# Patient Record
Sex: Male | Born: 1985
Health system: Southern US, Community
[De-identification: ages and names within clinical notes are randomized; demographics above are authoritative.]

## PROBLEM LIST (undated history)

## (undated) ENCOUNTER — Ambulatory Visit (HOSPITAL_COMMUNITY): Admission: EM | Discharge status: Absent without leave | Payer: Medicaid Other

## (undated) DIAGNOSIS — F209 Schizophrenia, unspecified: Secondary | ICD-10-CM

---

## 2008-07-18 ENCOUNTER — Emergency Department (HOSPITAL_COMMUNITY): Admission: EM | Admit: 2008-07-18 | Discharge: 2008-07-18 | Payer: Self-pay | Admitting: Emergency Medicine

## 2012-09-20 ENCOUNTER — Encounter (HOSPITAL_COMMUNITY): Payer: Self-pay | Admitting: Emergency Medicine

## 2012-09-20 ENCOUNTER — Emergency Department (HOSPITAL_COMMUNITY)
Admission: EM | Admit: 2012-09-20 | Discharge: 2012-09-20 | Disposition: A | Discharge status: Home / Self Care | Payer: Self-pay | Attending: Emergency Medicine | Admitting: Emergency Medicine

## 2012-09-20 DIAGNOSIS — K029 Dental caries, unspecified: Secondary | ICD-10-CM | POA: Insufficient documentation

## 2012-09-20 DIAGNOSIS — K002 Abnormalities of size and form of teeth: Secondary | ICD-10-CM | POA: Insufficient documentation

## 2012-09-20 MED ORDER — PENICILLIN V POTASSIUM 500 MG PO TABS: 500.0000 mg | ORAL_TABLET | Freq: Four times a day (QID) | ORAL | Status: DC

## 2012-09-20 MED ORDER — OXYCODONE-ACETAMINOPHEN 5-325 MG PO TABS
2.0000 | ORAL_TABLET | Freq: Once | ORAL | Status: AC
  Administered 2012-09-20: 2 via ORAL
  Filled 2012-09-20: qty 2

## 2012-09-20 MED ORDER — OXYCODONE-ACETAMINOPHEN 5-325 MG PO TABS: ORAL_TABLET | ORAL | Status: DC

## 2012-09-20 NOTE — ED Provider Notes (Signed)
CSN: [id number]     Arrival date & time 09/20/12  1013 History   First MD Initiated Contact with Patient 09/20/12 1017     Chief Complaint  Patient presents with  . Dental Pain   (Consider location/radiation/quality/duration/timing/severity/associated sxs/prior Treatment) HPI Pt is a 27yo male c/o dental pain x1 week, worsening x3 days. Pain is gradually worsening, constant, 10/10, throbbing in right upper front tooth, radiating to back top jaw.  Has tried Ibuprofen, doesn't help.  Does not have regular dental care.  Denies trauma to face or mouth. Denies fever, n/v, difficulty breathing or swallowing.  Denies allergies PCN.  History reviewed. No pertinent past medical history. History reviewed. No pertinent past surgical history. No family history on file. History  Substance Use Topics  . Smoking status: Never Smoker   . Smokeless tobacco: Not on file  . Alcohol Use: Yes     Comment: occasional    Review of Systems  Constitutional: Negative for fever and chills.  HENT: Positive for dental problem. Negative for sore throat, drooling, mouth sores, trouble swallowing and voice change.   Gastrointestinal: Negative for nausea and vomiting.  All other systems reviewed and are negative.    Allergies  Review of patient's allergies indicates no known allergies.  Home Medications   Current Outpatient Rx  Name  Route  Sig  Dispense  Refill  . ibuprofen (ADVIL,MOTRIN) 200 MG tablet   Oral   Take 800 mg by mouth every 6 (six) hours as needed for pain.         Marland Kitchen oxyCODONE-acetaminophen (PERCOCET/ROXICET) 5-325 MG per tablet      Take 1-2 pills every 4-6 hours as needed for pain.   10 tablet   0   . penicillin v potassium (VEETID) 500 MG tablet   Oral   Take 1 tablet (500 mg total) by mouth 4 (four) times daily.   40 tablet   0    BP 136/95  Pulse 68  Temp(Src) 98.3 F (36.8 C) (Oral)  Resp 17  Ht 6\' 1"  (1.854 m)  Wt 155 lb (70.308 kg)  BMI 20.45 kg/m2  SpO2  98% Physical Exam  Nursing note and vitals reviewed. Constitutional: He is oriented to person, place, and time. He appears well-developed and well-nourished.  HENT:  Head: Normocephalic and atraumatic.  Right Ear: Hearing, tympanic membrane, external ear and ear canal normal.  Left Ear: Hearing, tympanic membrane, external ear and ear canal normal.  Nose: Nose normal.  Mouth/Throat: Uvula is midline, oropharynx is clear and moist and mucous membranes are normal. Abnormal dentition. Dental caries present. No dental abscesses or edematous. No oropharyngeal exudate, posterior oropharyngeal edema, posterior oropharyngeal erythema or tonsillar abscesses.    Cracked tooth #8 (front right), TTP, no obvious abscess.  No drainage, swelling or bleeding.  Eyes: EOM are normal.  Neck: Normal range of motion.  Cardiovascular: Normal rate.   Pulmonary/Chest: Effort normal.  Musculoskeletal: Normal range of motion.  Neurological: He is alert and oriented to person, place, and time.  Skin: Skin is warm and dry.  Psychiatric: He has a normal mood and affect. His behavior is normal.    ED Course  Procedures (including critical care time) Labs Review Labs Reviewed - No data to display Imaging Review No results found.  MDM   1. Pain due to dental caries    Pt has a cracked front tooth with dental carrie, pain x1 week. No obvious abscess seen on exam.  Rx: PCN and percocet.  Provided dental resource guide as well as flyer for [employer] [facility] at [facility] Sept 26-27th.  No dentist on call today.  Return precautions provided.  Pt verbalized understanding and agreement with tx plan.    Junius Finner, PA-C 09/21/12 1417

## 2012-09-20 NOTE — ED Notes (Signed)
Pt states that dental pain has been there but really progressed over the past three days.  Pain in right upper front tooth and radiates to back of top jaw.

## 2012-09-20 NOTE — Progress Notes (Signed)
[facility] provided pt with a list of primary care resources and dental resources.  °

## 2012-09-21 NOTE — ED Provider Notes (Signed)
Medical screening examination/treatment/procedure(s) were performed by non-physician practitioner and as supervising physician I was immediately available for consultation/collaboration.   Shanna Cisco, MD 09/21/12 [phone]

## 2013-06-11 ENCOUNTER — Emergency Department (HOSPITAL_COMMUNITY)
Admission: EM | Admit: 2013-06-11 | Discharge: 2013-06-11 | Disposition: A | Discharge status: Home / Self Care | Payer: Self-pay | Attending: Emergency Medicine | Admitting: Emergency Medicine

## 2013-06-11 ENCOUNTER — Emergency Department (HOSPITAL_COMMUNITY): Payer: Self-pay

## 2013-06-11 DIAGNOSIS — Y9389 Activity, other specified: Secondary | ICD-10-CM | POA: Insufficient documentation

## 2013-06-11 DIAGNOSIS — S62304A Unspecified fracture of fourth metacarpal bone, right hand, initial encounter for closed fracture: Secondary | ICD-10-CM

## 2013-06-11 DIAGNOSIS — W3400XA Accidental discharge from unspecified firearms or gun, initial encounter: Secondary | ICD-10-CM | POA: Insufficient documentation

## 2013-06-11 DIAGNOSIS — Y9289 Other specified places as the place of occurrence of the external cause: Secondary | ICD-10-CM | POA: Insufficient documentation

## 2013-06-11 DIAGNOSIS — S61409A Unspecified open wound of unspecified hand, initial encounter: Secondary | ICD-10-CM | POA: Insufficient documentation

## 2013-06-11 DIAGNOSIS — Z23 Encounter for immunization: Secondary | ICD-10-CM | POA: Insufficient documentation

## 2013-06-11 DIAGNOSIS — IMO0002 Reserved for concepts with insufficient information to code with codable children: Secondary | ICD-10-CM | POA: Insufficient documentation

## 2013-06-11 DIAGNOSIS — S62309A Unspecified fracture of unspecified metacarpal bone, initial encounter for closed fracture: Secondary | ICD-10-CM | POA: Insufficient documentation

## 2013-06-11 MED ORDER — TETANUS-DIPHTH-ACELL PERTUSSIS 5-2.5-18.5 LF-MCG/0.5 IM SUSP
0.5000 mL | Freq: Once | INTRAMUSCULAR | Status: AC
  Administered 2013-06-11: 0.5 mL via INTRAMUSCULAR
  Filled 2013-06-11: qty 0.5

## 2013-06-11 MED ORDER — HYDROCODONE-ACETAMINOPHEN 5-325 MG PO TABS: 2.0000 | ORAL_TABLET | ORAL | Status: DC | PRN

## 2013-06-11 MED ORDER — CEPHALEXIN 500 MG PO CAPS: 500.0000 mg | ORAL_CAPSULE | Freq: Two times a day (BID) | ORAL | Status: DC

## 2013-06-11 MED ORDER — HYDROCODONE-ACETAMINOPHEN 5-325 MG PO TABS
2.0000 | ORAL_TABLET | Freq: Once | ORAL | Status: AC
  Administered 2013-06-11: 2 via ORAL
  Filled 2013-06-11: qty 2

## 2013-06-11 MED ORDER — IBUPROFEN 800 MG PO TABS
800.0000 mg | ORAL_TABLET | Freq: Three times a day (TID) | ORAL | Status: DC
Start: 2013-06-11 — End: 2013-06-19

## 2013-06-11 NOTE — Discharge Instructions (Signed)
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 Organization         Address  Phone  Notes  [facility], Mobile Crisis Care Unit  [phone]   [facility]  [address] [place], [place] [phone]   Doristine Locks [address] [place] [phone]    Self-Help/Support Groups Organization         Address  Phone             Notes  [facility] - variety of support groups  [phone] Call for more information  Narcotics Anonymous (NA), [facility] [address], [employer] [place]  2 meetings at this location   Statistician         Address  Phone  Notes  [facility] [address],    [place]  [phone]   [facility]  [address] [place] [address], [place], [place] [phone]   [facility] Treatment Facility [address] [place], [place] [phone] Admissions: 8am-3pm M-F  [facility] [address],    [place], [place] [phone]   [facility] [address] [place], [place], [place] [phone]   [facility] [address]  [place], [place] [phone]   [facility] - Intensive Outpatient [address], [place], [place] [phone]   [facility] ([facility] [address] [place]  [place], [place] [phone] or [phone]   [facility] ([facility]) [address] [place], [place] [phone] Accepts Medicaid  [facility] [address],  [place] [phone] Substance Abuse/Addiction Treatment   [facility] Organization         Address  Phone  Notes  [facility]  [phone]   Angie Fava, PhD [address] Kn[address] [place], [place]   [phone] or [phone]   [facility] Behavioral   [address] [place], [place] [phone]   [facility] [address] [place], [place] [phone] Insurance/Medicaid/sponsorship through [employer] and Families [address]  [place], [place] [phone] [place], [place] [phone]    Dr. Adele Schilder  [phone]   [facility] Dept. 1) [address] [place] 2) [place] 3)  [address], [place] [phone]  [phone]   [place] [phone] or [phone] (After Hours)
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
## 2013-06-11 NOTE — ED Notes (Signed)
Ortho paged. 

## 2013-06-11 NOTE — Progress Notes (Signed)
Orthopedic Tech Progress Note Patient Details:  Wesley Peterson [birth year] [id number]  Ortho Devices Type of Ortho Device: Ulna gutter splint Ortho Device/Splint Location: rue Ortho Device/Splint Interventions: Application   Mikiya Nebergall 06/11/2013, 10:49 PM
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
## 2013-06-19 ENCOUNTER — Emergency Department (HOSPITAL_COMMUNITY)
Admission: EM | Admit: 2013-06-19 | Discharge: 2013-06-19 | Disposition: A | Discharge status: Home / Self Care | Payer: Self-pay | Attending: Emergency Medicine | Admitting: Emergency Medicine

## 2013-06-19 ENCOUNTER — Encounter (HOSPITAL_COMMUNITY): Payer: Self-pay | Admitting: Emergency Medicine

## 2013-06-19 DIAGNOSIS — Z09 Encounter for follow-up examination after completed treatment for conditions other than malignant neoplasm: Secondary | ICD-10-CM | POA: Insufficient documentation

## 2013-06-19 DIAGNOSIS — Z791 Long term (current) use of non-steroidal anti-inflammatories (NSAID): Secondary | ICD-10-CM | POA: Insufficient documentation

## 2013-06-19 DIAGNOSIS — Z792 Long term (current) use of antibiotics: Secondary | ICD-10-CM | POA: Insufficient documentation

## 2013-06-19 MED ORDER — OXYCODONE-ACETAMINOPHEN 5-325 MG PO TABS: 2.0000 | ORAL_TABLET | ORAL | Status: DC | PRN

## 2013-06-19 MED ORDER — HYDROCODONE-ACETAMINOPHEN 5-325 MG PO TABS
2.0000 | ORAL_TABLET | Freq: Once | ORAL | Status: AC
  Administered 2013-06-19: 2 via ORAL
  Filled 2013-06-19: qty 2
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
## 2013-06-19 NOTE — ED Provider Notes (Signed)
Medical screening examination/treatment/procedure(s) were performed by non-physician practitioner and as supervising physician I was immediately available for consultation/collaboration.  Lyanne Co, MD 06/19/13 [phone]

## 2013-06-19 NOTE — Discharge Instructions (Signed)
Take Percocet as needed for pain. Follow up with Dr. Melvyn Novas as soon as possible for further evaluation of your fracture.
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
## 2013-06-24 NOTE — Discharge Planning (Signed)
[facility] was not able to see patient, [facility] orange card information and resource guide will be mailed to the address listed. °

## 2013-09-09 ENCOUNTER — Encounter (HOSPITAL_COMMUNITY): Payer: Self-pay | Admitting: Emergency Medicine

## 2013-09-09 ENCOUNTER — Emergency Department (HOSPITAL_COMMUNITY)
Admission: EM | Admit: 2013-09-09 | Discharge: 2013-09-09 | Disposition: A | Discharge status: Home / Self Care | Payer: Self-pay | Attending: Emergency Medicine | Admitting: Emergency Medicine

## 2013-09-09 DIAGNOSIS — Z79899 Other long term (current) drug therapy: Secondary | ICD-10-CM | POA: Insufficient documentation

## 2013-09-09 DIAGNOSIS — R112 Nausea with vomiting, unspecified: Secondary | ICD-10-CM | POA: Insufficient documentation

## 2013-09-09 DIAGNOSIS — R197 Diarrhea, unspecified: Secondary | ICD-10-CM | POA: Insufficient documentation

## 2013-09-09 DIAGNOSIS — R1084 Generalized abdominal pain: Secondary | ICD-10-CM | POA: Insufficient documentation

## 2013-09-09 LAB — URINALYSIS, ROUTINE W REFLEX MICROSCOPIC
BILIRUBIN URINE: NEGATIVE
Glucose, UA: NEGATIVE mg/dL
HGB URINE DIPSTICK: NEGATIVE
Ketones, ur: NEGATIVE mg/dL
Leukocytes, UA: NEGATIVE
NITRITE: NEGATIVE
PH: 6.5 (ref 5.0–8.0)
Protein, ur: NEGATIVE mg/dL
SPECIFIC GRAVITY, URINE: 1.018 (ref 1.005–1.030)
Urobilinogen, UA: 0.2 mg/dL (ref 0.0–1.0)

## 2013-09-09 LAB — CBC
HCT: 40.4 % (ref 39.0–52.0)
Hemoglobin: 13.8 g/dL (ref 13.0–17.0)
MCH: 32.2 pg (ref 26.0–34.0)
MCHC: 34.2 g/dL (ref 30.0–36.0)
MCV: 94.4 fL (ref 78.0–100.0)
PLATELETS: 260 10*3/uL (ref 150–400)
RBC: 4.28 MIL/uL (ref 4.22–5.81)
RDW: 13.3 % (ref 11.5–15.5)
WBC: 6.1 10*3/uL (ref 4.0–10.5)

## 2013-09-09 LAB — COMPREHENSIVE METABOLIC PANEL
ALBUMIN: 4 g/dL (ref 3.5–5.2)
ALK PHOS: 45 U/L (ref 39–117)
ALT: 11 U/L (ref 0–53)
AST: 18 U/L (ref 0–37)
Anion gap: 12 (ref 5–15)
BILIRUBIN TOTAL: 0.3 mg/dL (ref 0.3–1.2)
BUN: 8 mg/dL (ref 6–23)
CHLORIDE: 102 meq/L (ref 96–112)
CO2: 26 mEq/L (ref 19–32)
Calcium: 9.6 mg/dL (ref 8.4–10.5)
Creatinine, Ser: 0.91 mg/dL (ref 0.50–1.35)
GFR calc Af Amer: >90 mL/min (ref >90)
GFR calc non Af Amer: >90 mL/min (ref >90)
Glucose, Bld: 131 mg/dL — ABNORMAL HIGH (ref 70–99)
POTASSIUM: 4.6 meq/L (ref 3.7–5.3)
SODIUM: 140 meq/L (ref 137–147)
Total Protein: 6.3 g/dL (ref 6.0–8.3)

## 2013-09-09 LAB — LIPASE, BLOOD: LIPASE: 29 U/L (ref 11–59)

## 2013-09-09 MED ORDER — ONDANSETRON HCL 4 MG PO TABS: 4.0000 mg | ORAL_TABLET | Freq: Four times a day (QID) | ORAL | Status: DC

## 2013-09-09 MED ORDER — ONDANSETRON HCL 4 MG/2ML IJ SOLN
4.0000 mg | Freq: Once | INTRAMUSCULAR | Status: AC
  Administered 2013-09-09: 4 mg via INTRAVENOUS
  Filled 2013-09-09: qty 2

## 2013-09-09 MED ORDER — SODIUM CHLORIDE 0.9 % IV BOLUS (SEPSIS)
1000.0000 mL | Freq: Once | INTRAVENOUS | Status: AC
  Administered 2013-09-09: 1000 mL via INTRAVENOUS

## 2013-09-09 NOTE — ED Notes (Signed)
Patient given gingerale for fluid challenge per his request  

## 2013-09-09 NOTE — ED Provider Notes (Signed)
CSN: [id number]     Arrival date & time 09/09/13  0704 History   First MD Initiated Contact with Patient 09/09/13 [phone]     Chief Complaint  Patient presents with  . Nausea  . Emesis  . Abdominal Pain     (Consider location/radiation/quality/duration/timing/severity/associated sxs/prior Treatment) HPI Pt presenting with c/o nausea and vomiting x 4 since yesterday evening when symptoms began.  He c/o diffuse abdominal pain and cramping.  Has had diarrhea x 3 since onset of illness as well- no blood or mucous.  Vomit is without blood or bile.  No fever/chils.  No urinary symptoms.  No specific sick contacts.  Symptoms began after eating chicken tenders last night- no others have been sick.  No recent travel.  There are no other associated systemic symptoms, there are no other alleviating or modifying factors.   History reviewed. No pertinent past medical history. History reviewed. No pertinent past surgical history. History reviewed. No pertinent family history. History  Substance Use Topics  . Smoking status: Never Smoker   . Smokeless tobacco: Not on file  . Alcohol Use: Yes     Comment: occasional    Review of Systems ROS reviewed and all otherwise negative except for mentioned in HPI    Allergies  Review of patient's allergies indicates no known allergies.  Home Medications   Prior to Admission medications   Medication Sig Start Date End Date Taking? Authorizing Provider  ondansetron (ZOFRAN) 4 MG tablet Take 1 tablet (4 mg total) by mouth every 6 (six) hours. 09/09/13   Ethelda Chick, MD   BP 146/89  Pulse 58  Temp(Src) 98.9 F (37.2 C) (Oral)  Resp 16  Ht  (1.854 m)  Wt 139 lb (63.05 kg)  BMI 18.34 kg/m2  SpO2 98% Vitals reviewed Physical Exam Physical Examination: General appearance - alert, well appearing, and in no distress Mental status - alert, oriented to person, place, and time Eyes - no conjunctival injection, no scleral icterus Mouth - mucous  membranes moist, pharynx normal without lesions Chest - clear to auscultation, no wheezes, rales or rhonchi, symmetric air entry Heart - normal rate, regular rhythm, normal S1, S2, no murmurs, rubs, clicks or gallops Abdomen - soft, mild diffuse tenderness to palpation, no gaurding or rebound tenderness, nabs, nondistended, no masses or organomegaly Extremities - peripheral pulses normal, no pedal edema, no clubbing or cyanosis Skin - normal coloration and turgor, no rashes  ED Course  Procedures (including critical care time) Labs Review Labs Reviewed  COMPREHENSIVE METABOLIC PANEL - Abnormal; Notable for the following:    Glucose, Bld 131 (*)    All other components within normal limits  CBC  LIPASE, BLOOD  URINALYSIS, ROUTINE W REFLEX MICROSCOPIC    Imaging Review No results found.   EKG Interpretation None      MDM   Final diagnoses:  Nausea vomiting and diarrhea    Pt presenting with c/o N/V/D which began yesterday evening.  Abdomen is benign- mild diffuse tenderness.  Labs reassuring.  Pt received IV fluids, nausea meds.  No sigifncant signs of dehydration.  Discharged with strict return precautions.  Pt agreeable with plan.    Ethelda Chick, MD 09/09/13 1356

## 2013-09-09 NOTE — ED Notes (Signed)
Patient complaining of nausea and vomiting X4 since yesterday at 19:00 after eating.  Patient complains of 10/10 abdominal pain.  Patient is having diarrhea x3 since yesterday.

## 2013-09-09 NOTE — ED Notes (Signed)
Pt attempting to provide urine specimen

## 2013-09-09 NOTE — ED Notes (Signed)
Patient had only drank a sip of ginger ale. Patient instructed to take his hat off of his face and wake up. Instructed patient again that he needed to drink in order to determine if he was able to keep fluids down. Patient finally drank 1/2 cup of ginger ale.

## 2013-09-09 NOTE — Discharge Instructions (Signed)
Return to the ED with any concerns including vomiting and not able to keep down liquids, worsening abdominal pain, pain which localizes in the right lower abdomen, decreased level of alertness/lethargy, or any other alarming symptoms

## 2014-02-16 ENCOUNTER — Emergency Department (HOSPITAL_COMMUNITY)
Admission: EM | Admit: 2014-02-16 | Discharge: 2014-02-17 | Disposition: A | Discharge status: Home / Self Care | Payer: Self-pay | Attending: Emergency Medicine | Admitting: Emergency Medicine

## 2014-02-16 ENCOUNTER — Encounter (HOSPITAL_COMMUNITY): Payer: Self-pay | Admitting: Emergency Medicine

## 2014-02-16 DIAGNOSIS — S060X9A Concussion with loss of consciousness of unspecified duration, initial encounter: Secondary | ICD-10-CM | POA: Insufficient documentation

## 2014-02-16 DIAGNOSIS — W1839XA Other fall on same level, initial encounter: Secondary | ICD-10-CM | POA: Insufficient documentation

## 2014-02-16 DIAGNOSIS — R55 Syncope and collapse: Secondary | ICD-10-CM | POA: Insufficient documentation

## 2014-02-16 DIAGNOSIS — S0990XA Unspecified injury of head, initial encounter: Secondary | ICD-10-CM

## 2014-02-16 DIAGNOSIS — Y998 Other external cause status: Secondary | ICD-10-CM | POA: Insufficient documentation

## 2014-02-16 DIAGNOSIS — F1210 Cannabis abuse, uncomplicated: Secondary | ICD-10-CM | POA: Insufficient documentation

## 2014-02-16 DIAGNOSIS — Y9389 Activity, other specified: Secondary | ICD-10-CM | POA: Insufficient documentation

## 2014-02-16 DIAGNOSIS — Y9289 Other specified places as the place of occurrence of the external cause: Secondary | ICD-10-CM | POA: Insufficient documentation

## 2014-02-16 LAB — I-STAT CHEM 8, ED
BUN: 14 mg/dL (ref 6–23)
Calcium, Ion: 1.16 mmol/L (ref 1.12–1.23)
Chloride: 105 mmol/L (ref 96–112)
Creatinine, Ser: 1.1 mg/dL (ref 0.50–1.35)
Glucose, Bld: 94 mg/dL (ref 70–99)
HCT: 46 % (ref 39.0–52.0)
Hemoglobin: 15.6 g/dL (ref 13.0–17.0)
Potassium: 3.7 mmol/L (ref 3.5–5.1)
Sodium: 142 mmol/L (ref 135–145)
TCO2: 22 mmol/L (ref 0–100)

## 2014-02-16 LAB — COMPREHENSIVE METABOLIC PANEL
ALT: 10 U/L (ref 0–53)
AST: 18 U/L (ref 0–37)
Albumin: 5.7 g/dL — ABNORMAL HIGH (ref 3.5–5.2)
Alkaline Phosphatase: 43 U/L (ref 39–117)
Anion gap: 7 (ref 5–15)
BUN: 15 mg/dL (ref 6–23)
CO2: 26 mmol/L (ref 19–32)
Calcium: 10 mg/dL (ref 8.4–10.5)
Chloride: 106 mmol/L (ref 96–112)
Creatinine, Ser: 1.01 mg/dL (ref 0.50–1.35)
GFR calc Af Amer: >90 mL/min (ref >90)
GFR calc non Af Amer: >90 mL/min (ref >90)
Glucose, Bld: 97 mg/dL (ref 70–99)
Potassium: 3.7 mmol/L (ref 3.5–5.1)
Sodium: 139 mmol/L (ref 135–145)
Total Bilirubin: 0.7 mg/dL (ref 0.3–1.2)
Total Protein: 8 g/dL (ref 6.0–8.3)

## 2014-02-16 LAB — RAPID URINE DRUG SCREEN, HOSP PERFORMED
Amphetamines: NOT DETECTED
Barbiturates: NOT DETECTED
Benzodiazepines: NOT DETECTED
Cocaine: NOT DETECTED
Opiates: NOT DETECTED
Tetrahydrocannabinol: POSITIVE — AB

## 2014-02-16 LAB — ETHANOL: Alcohol, Ethyl (B): <5 mg/dL (ref 0–9)

## 2014-02-16 LAB — CBC
HCT: 42.2 % (ref 39.0–52.0)
Hemoglobin: 14.2 g/dL (ref 13.0–17.0)
MCH: 32.8 pg (ref 26.0–34.0)
MCHC: 33.6 g/dL (ref 30.0–36.0)
MCV: 97.5 fL (ref 78.0–100.0)
Platelets: 218 10*3/uL (ref 150–400)
RBC: 4.33 MIL/uL (ref 4.22–5.81)
RDW: 13.1 % (ref 11.5–15.5)
WBC: 6.4 10*3/uL (ref 4.0–10.5)

## 2014-02-16 LAB — SALICYLATE LEVEL: Salicylate Lvl: <4 mg/dL (ref 2.8–20.0)

## 2014-02-16 LAB — ACETAMINOPHEN LEVEL: Acetaminophen (Tylenol), Serum: <10 ug/mL — ABNORMAL LOW (ref 10–30)
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
## 2014-02-16 NOTE — ED Notes (Addendum)
MDs Romeo AppleHarrison [facility] informed of situation, shown ED EKG and obtained order for catheterization.

## 2014-02-16 NOTE — ED Notes (Signed)
Bed: ZO10WA05 Expected date:  Expected time:  Means of arrival:  Comments: EMS in custody

## 2014-02-17 ENCOUNTER — Emergency Department (HOSPITAL_COMMUNITY): Payer: Self-pay

## 2014-02-17 LAB — CBG MONITORING, ED: Glucose-Capillary: 93 mg/dL (ref 70–99)

## 2014-02-17 NOTE — Discharge Instructions (Signed)
Head Injury °You have received a head injury. It does not appear serious at this time. Headaches and vomiting are common following head injury. It should be easy to awaken from sleeping. Sometimes it is necessary for you to stay in the emergency department for a while for observation. Sometimes admission to the hospital may be needed. After injuries such as yours, most problems occur within the first 24 hours, but side effects may occur up to 7-10 days after the injury. It is important for you to carefully monitor your condition and contact your health care provider or seek immediate medical care if there is a change in your condition. °WHAT ARE THE TYPES OF HEAD INJURIES? °Head injuries can be as minor as a bump. Some head injuries can be more severe. More severe head injuries include: °· A jarring injury to the brain (concussion). °· A bruise of the brain (contusion). This mean there is bleeding in the brain that can cause swelling. °· A cracked skull (skull fracture). °· Bleeding in the brain that collects, clots, and forms a bump (hematoma). °WHAT CAUSES A HEAD INJURY? °A serious head injury is most likely to happen to someone who is in a car wreck and is not wearing a seat belt. Other causes of major head injuries include bicycle or motorcycle accidents, sports injuries, and falls. °HOW ARE HEAD INJURIES DIAGNOSED? °A complete history of the event leading to the injury and your current symptoms will be helpful in diagnosing head injuries. Many times, pictures of the brain, such as CT or MRI are needed to see the extent of the injury. Often, an overnight hospital stay is necessary for observation.  °WHEN SHOULD I SEEK IMMEDIATE MEDICAL CARE?  °You should get help right away if: °· You have confusion or drowsiness. °· You feel sick to your stomach (nauseous) or have continued, forceful vomiting. °· You have dizziness or unsteadiness that is getting worse. °· You have severe, continued headaches not relieved by  medicine. Only take over-the-counter or prescription medicines for pain, fever, or discomfort as directed by your health care provider. °· You do not have normal function of the arms or legs or are unable to walk. °· You notice changes in the black spots in the center of the colored part of your eye (pupil). °· You have a clear or bloody fluid coming from your nose or ears. °· You have a loss of vision. °During the next 24 hours after the injury, you must stay with someone who can watch you for the warning signs. This person should contact local emergency services (911 in the [place] if you have seizures, you become unconscious, or you are unable to wake up. °HOW CAN I PREVENT A HEAD INJURY IN THE FUTURE? °The most important factor for preventing major head injuries is avoiding motor vehicle accidents.  To minimize the potential for damage to your head, it is crucial to wear seat belts while riding in motor vehicles. Wearing helmets while bike riding and playing collision sports (like football) is also helpful. Also, avoiding dangerous activities around the house will further help reduce your risk of head injury.  °WHEN CAN I RETURN TO NORMAL ACTIVITIES AND ATHLETICS? °You should be reevaluated by your health care provider before returning to these activities. If you have any of the following symptoms, you should not return to activities or contact sports until 1 week after the symptoms have stopped: °· Persistent headache. °· Dizziness or vertigo. °· Poor attention and concentration. °· Confusion. °·   Memory problems. °· Nausea or vomiting. °· Fatigue or tire easily. °· Irritability. °· Intolerant of bright lights or loud noises. °· Anxiety or depression. °· Disturbed sleep. °MAKE SURE YOU:  °· Understand these instructions. °· Will watch your condition. °· Will get help right away if you are not doing well or get worse. °Document Released: 12/26/2004 Document Revised: 12/31/2012 Document Reviewed:  09/02/2012 °ExitCare® Patient Information ©2015 [employer]. This information is not intended to replace advice given to you by your health care provider. Make sure you discuss any questions you have with your health care provider. ° °Syncope °Syncope means a person passes out (faints). The person usually wakes up in less than 5 minutes. It is important to seek medical care for syncope. °HOME CARE °· Have someone stay with you until you feel normal. °· Do not drive, use machines, or play sports until your doctor says it is okay. °· Keep all doctor visits as told. °· Lie down when you feel like you might pass out. Take deep breaths. Wait until you feel normal before standing up. °· Drink enough fluids to keep your pee (urine) clear or pale yellow. °· If you take blood pressure or heart medicine, get up slowly. Take several minutes to sit and then stand. °GET HELP RIGHT AWAY IF:  °· You have a severe headache. °· You have pain in the chest, belly (abdomen), or back. °· You are bleeding from the mouth or butt (rectum). °· You have black or tarry poop (stool). °· You have an irregular or very fast heartbeat. °· You have pain with breathing. °· You keep passing out, or you have shaking (seizures) when you pass out. °· You pass out when sitting or lying down. °· You feel confused. °· You have trouble walking. °· You have severe weakness. °· You have vision problems. °If you fainted, call your local emergency services (911 in [place] Do not drive yourself to the hospital. °MAKE SURE YOU:  °· Understand these instructions. °· Will watch your condition. °· Will get help right away if you are not doing well or get worse. °Document Released: 06/14/2007 Document Revised: 06/27/2011 Document Reviewed: 02/24/2011 °ExitCare® Patient Information ©2015 [employer]. This information is not intended to replace advice given to you by your health care provider. Make sure you discuss any questions you have with your health care  provider. ° °

## 2014-02-17 NOTE — ED Notes (Signed)
Pt ambulating independently w/ steady gait on d/c in no acute distress, A&Ox4.D/c instructions reviewed w/ pt and family - pt and family deny any further questions or concerns at present.  

## 2014-02-27 NOTE — ED Provider Notes (Signed)
CSN: [id number]     Arrival date & time 02/16/14  2015 History   First MD Initiated Contact with Patient 02/16/14 2245     Chief Complaint  Patient presents with  . Loss of Consciousness  . Medical Clearance     (Consider location/radiation/quality/duration/timing/severity/associated sxs/prior Treatment) HPI   28yM with syncope? Apparently became unresponsive while in back of police vehicle after being arrested. Pt guarded historian. Not very forthcoming with history despite multiple lines of questioning. Does answer questions appropriately though. Reports striking back of when when being taken into custody. Mild pain since. Did not lose consciousness with this. Was not until ~10 minutes later when in back of car that he became unresponsive. He denies preceding symptoms. Denies ingestion. NO significant PMHx. No history of recurrent syncope. No complaints currently aside from mild HA.   History reviewed. No pertinent past medical history. History reviewed. No pertinent past surgical history. History reviewed. No pertinent family history. History  Substance Use Topics  . Smoking status: Never Smoker   . Smokeless tobacco: Not on file  . Alcohol Use: Yes     Comment: occasional    Review of Systems  All systems reviewed and negative, other than as noted in HPI.   Allergies  Review of patient's allergies indicates no known allergies.  Home Medications   Prior to Admission medications   Medication Sig Start Date End Date Taking? Authorizing Provider  ondansetron (ZOFRAN) 4 MG tablet Take 1 tablet (4 mg total) by mouth every 6 (six) hours. 09/09/13   Ethelda Chick, MD   BP 140/89 mmHg  Pulse 37  Resp 12  SpO2 85% Physical Exam  Constitutional: He is oriented to person, place, and time. He appears well-developed and well-nourished. No distress.  HENT:  Head: Normocephalic and atraumatic.  Mouth/Throat: No oropharyngeal exudate.  Eyes: Conjunctivae and EOM are normal. Pupils  are equal, round, and reactive to light. Right eye exhibits no discharge. Left eye exhibits no discharge.  Neck: Neck supple.  No midline spinal tenderness  Cardiovascular: Normal rate, regular rhythm and normal heart sounds.  Exam reveals no gallop and no friction rub.   No murmur heard. Pulmonary/Chest: Effort normal and breath sounds normal. No respiratory distress.  Abdominal: Soft. He exhibits no distension. There is no tenderness.  Musculoskeletal: He exhibits no edema or tenderness.  Neurological: He is alert and oriented to person, place, and time. No cranial nerve deficit. He exhibits normal muscle tone. Coordination normal.  Skin: Skin is warm and dry.  Psychiatric: He has a normal mood and affect. His behavior is normal. Thought content normal.  Nursing note and vitals reviewed.   ED Course  Procedures (including critical care time) Labs Review Labs Reviewed  ACETAMINOPHEN LEVEL - Abnormal; Notable for the following:    Acetaminophen (Tylenol), Serum <10.0 (*)    All other components within normal limits  COMPREHENSIVE METABOLIC PANEL - Abnormal; Notable for the following:    Albumin 5.7 (*)    All other components within normal limits  URINE RAPID DRUG SCREEN (HOSP PERFORMED) - Abnormal; Notable for the following:    Tetrahydrocannabinol POSITIVE (*)    All other components within normal limits  CBC  ETHANOL  SALICYLATE LEVEL  CBG MONITORING, ED  I-STAT CHEM 8, ED    Imaging Review No results found.   EKG Interpretation   Date/Time:  Monday February 16 2014 20:21:13 EST Ventricular Rate:  75 PR Interval:  161 QRS Duration: 101 QT Interval:  368  QTC Calculation: 411 R Axis:   87 Text Interpretation:  Sinus rhythm RAE diffuse STE, likely BER no old  Confirmed by Rasheema Truluck  MD, Jaskarn Schweer [phone]([id number]) on 02/16/2014 11:03:47 PM      MDM   Final diagnoses:  Head injury  Syncope and collapse    28yM with apparent syncope after being taken into police custody. Pt very  guarded historian. His exam is fairly unremarkable though as well as his work-up. HD stable. Low suspicion for emergent process.     Raeford RazorStephen Adonai Selsor, MD 02/27/14 [phone]

## 2014-10-08 ENCOUNTER — Emergency Department (HOSPITAL_COMMUNITY)
Admission: EM | Admit: 2014-10-08 | Discharge: 2014-10-08 | Disposition: A | Discharge status: Home / Self Care | Payer: Self-pay | Attending: Emergency Medicine | Admitting: Emergency Medicine

## 2014-10-08 ENCOUNTER — Encounter (HOSPITAL_COMMUNITY): Payer: Self-pay | Admitting: *Deleted

## 2014-10-08 DIAGNOSIS — L0211 Cutaneous abscess of neck: Secondary | ICD-10-CM | POA: Insufficient documentation

## 2014-10-08 DIAGNOSIS — L0291 Cutaneous abscess, unspecified: Secondary | ICD-10-CM

## 2014-10-08 DIAGNOSIS — Z79899 Other long term (current) drug therapy: Secondary | ICD-10-CM | POA: Insufficient documentation

## 2014-10-08 MED ORDER — LIDOCAINE-EPINEPHRINE (PF) 2 %-1:200000 IJ SOLN
20.0000 mL | Freq: Once | INTRAMUSCULAR | Status: AC
  Administered 2014-10-08: 20 mL via INTRADERMAL
  Filled 2014-10-08: qty 20

## 2014-10-08 NOTE — ED Provider Notes (Signed)
CSN: [id number]     Arrival date & time 10/08/14  2206 History  By signing my name below, I, Soijett Blue, attest that this documentation has been prepared under the direction and in the presence of Wynetta Emery, PA-C Electronically Signed: Soijett Blue, ED Scribe. 10/08/2014. 10:35 PM.   Chief Complaint  Patient presents with  . Abscess      The history is provided by the patient. No language interpreter was used.    Wesley Peterson is a 29 y.o. male who presents to the Emergency Department complaining of abscess to the back of the neck onset 1 year worsening for 2 days. He states that he is having associated symptoms of redness and swelling. He states that he has not tried any medications for the relief of his symptoms. He denies fever, chills, drainage, and any other symptoms. Denies allergies to medications.   History reviewed. No pertinent past medical history. History reviewed. No pertinent past surgical history. History reviewed. No pertinent family history. Social History  Substance Use Topics  . Smoking status: Never Smoker   . Smokeless tobacco: None  . Alcohol Use: Yes     Comment: occasional    Review of Systems  A complete 10 system review of systems was obtained and all systems are negative except as noted in the HPI and PMH.    Allergies  Review of patient's allergies indicates no known allergies.  Home Medications   Prior to Admission medications   Medication Sig Start Date End Date Taking? Authorizing Provider  ondansetron (ZOFRAN) 4 MG tablet Take 1 tablet (4 mg total) by mouth every 6 (six) hours. 09/09/13   Jerelyn Scott, MD   BP 148/79 mmHg  Pulse 109  Temp(Src) 98.5 F (36.9 C) (Oral)  Resp 20  SpO2 100% Physical Exam  Constitutional: He is oriented to person, place, and time. He appears well-developed and well-nourished. No distress.  HENT:  Head: Normocephalic and atraumatic.  Eyes: EOM are normal.  Neck: Neck supple.  Cardiovascular: Normal  rate.   Pulmonary/Chest: Effort normal. No respiratory distress.  Abdominal: Soft. There is no tenderness.  Musculoskeletal: Normal range of motion.  Neurological: He is alert and oriented to person, place, and time.  Skin: Skin is warm and dry.  3 cm fluctuant abscess to posterior right neck. No surrounding cellulitis with full ROM of neck with no midline tenderness.   Psychiatric: He has a normal mood and affect. His behavior is normal.  Nursing note and vitals reviewed.   ED Course  Procedures (including critical care time) DIAGNOSTIC STUDIES: Oxygen Saturation is 100% on RA, nl by my interpretation.    COORDINATION OF CARE: 10:37 PM Discussed treatment plan with pt at bedside which includes I&D and pt agreed to plan.   INCISION AND DRAINAGE PROCEDURE NOTE: Patient identification was confirmed and verbal consent was obtained. This procedure was performed by Wynetta Emery, PA-C at 10:45 PM. Site: posterior right neck Sterile procedures observed: Yes Needle size: 27 gauge Anesthetic used (type and amt): 2 % lidocaine with epi Blade size: 11 Drainage: moderate Complexity: Complex Packing used: No Site anesthetized, incision made over site, wound drained and explored loculations, rinsed with copious amounts of normal saline, wound packed with sterile gauze, covered with dry, sterile dressing.  Pt tolerated procedure well without complications.  Instructions for care discussed verbally and pt provided with additional written instructions for homecare and f/u.    Labs Review Labs Reviewed - No data to display  Imaging Review  No results found. I have personally reviewed and evaluated these images and lab results as part of my medical decision-making.   EKG Interpretation None      MDM   Final diagnoses:  Abscess    Filed Vitals:   10/08/14 2211  BP: 148/79  Pulse: 109  Temp: 98.5 F (36.9 C)  TempSrc: Oral  Resp: 20  SpO2: 100%    Medications   lidocaine-EPINEPHrine (XYLOCAINE W/EPI) 2 %-1:200000 (PF) injection 20 mL (20 mLs Intradermal Given by Other 10/08/14 2245)    Wesley Peterson is a pleasant 29 y.o. male presenting with abscess to right lateral posterior neck. I and D performed with a large amount of purulent drainage. Large incision made, no indication for packing. No surrounding cellulitis.  Evaluation does not show pathology that would require ongoing emergent intervention or inpatient treatment. Pt is hemodynamically stable and mentating appropriately. Discussed findings and plan with patient/guardian, who agrees with care plan. All questions answered. Return precautions discussed and outpatient follow up given.    I personally performed the services described in this documentation, which was scribed in my presence. The recorded information has been reviewed and is accurate.    Wynetta Emery, PA-C 10/08/14 2308  Richardean Canal, MD 10/09/14 [phone]

## 2014-10-08 NOTE — ED Notes (Signed)
Pt in c/o lump to the back of his neck, states it has been there for the past year but in the last few days area has become red and swollen, denies drainage

## 2014-10-08 NOTE — Discharge Instructions (Signed)
Please follow with your primary care doctor in the next 2 days for a check-up. They must obtain records for further management.  ° °Do not hesitate to return to the Emergency Department for any new, worsening or concerning symptoms.  ° ° °Abscess °Care After °An abscess (also called a boil or furuncle) is an infected area that contains a collection of pus. Signs and symptoms of an abscess include pain, tenderness, redness, or hardness, or you may feel a moveable soft area under your skin. An abscess can occur anywhere in the body. The infection may spread to surrounding tissues causing cellulitis. A cut (incision) by the surgeon was made over your abscess and the pus was drained out. Gauze may have been packed into the space to provide a drain that will allow the cavity to heal from the inside outwards. The boil may be painful for 5 to 7 days. Most people with a boil do not have high fevers. Your abscess, if seen early, may not have localized, and may not have been lanced. If not, another appointment may be required for this if it does not get better on its own or with medications. °HOME CARE INSTRUCTIONS  °· Only take over-the-counter or prescription medicines for pain, discomfort, or fever as directed by your caregiver. °· When you bathe, soak and then remove gauze or iodoform packs at least daily or as directed by your caregiver. You may then wash the wound gently with mild soapy water. Repack with gauze or do as your caregiver directs. °SEEK IMMEDIATE MEDICAL CARE IF:  °· You develop increased pain, swelling, redness, drainage, or bleeding in the wound site. °· You develop signs of generalized infection including muscle aches, chills, fever, or a general ill feeling. °· An oral temperature above 102° F (38.9° C) develops, not controlled by medication. °See your caregiver for a recheck if you develop any of the symptoms described above. If medications (antibiotics) were prescribed, take them as  directed. °Document Released: 07/14/2004 Document Revised: 03/20/2011 Document Reviewed: 03/11/2007 °[employer]® Patient Information ©2015 [employer]. This information is not intended to replace advice given to you by your health care provider. Make sure you discuss any questions you have with your health care provider. ° °

## 2014-10-08 NOTE — ED Notes (Signed)
PA at bedside preforming I&D procedure

## 2014-10-31 ENCOUNTER — Emergency Department (HOSPITAL_COMMUNITY)
Admission: EM | Admit: 2014-10-31 | Discharge: 2014-10-31 | Disposition: A | Discharge status: Home / Self Care | Payer: Self-pay | Attending: Emergency Medicine | Admitting: Emergency Medicine

## 2014-10-31 ENCOUNTER — Encounter (HOSPITAL_COMMUNITY): Payer: Self-pay | Admitting: Emergency Medicine

## 2014-10-31 DIAGNOSIS — Z5189 Encounter for other specified aftercare: Secondary | ICD-10-CM

## 2014-10-31 DIAGNOSIS — Z720 Tobacco use: Secondary | ICD-10-CM | POA: Insufficient documentation

## 2014-10-31 DIAGNOSIS — Z4801 Encounter for change or removal of surgical wound dressing: Secondary | ICD-10-CM | POA: Insufficient documentation

## 2014-10-31 NOTE — ED Notes (Signed)
Pt was here 9/29 for I & D of right neck abscess. Returns today because he feels it is not getting better.

## 2014-10-31 NOTE — ED Provider Notes (Signed)
CSN: [id number]     Arrival date & time 10/31/14  [id number] History  By signing my name below, I, Sonum Patel, attest that this documentation has been prepared under the direction and in the presence of Teressa LowerVrinda Jaline Pincock, NP. Electronically Signed: Sonum Patel, Neurosurgeoncribe. 10/31/2014. 10:30 AM.     Chief Complaint  Patient presents with  . Wound Check    The history is provided by the patient. No language interpreter was used.     HPI Comments: Wesley Peterson is a 29 y.o. male who presents to the Emergency Department requesting wound check today. Patient was initially seen on 10/08/14 and had a posterior right neck abscess I&D'd. He reports mild pain to the affected area. He denies modifying factors. He denies fever, chills. His tetanus is UTD.   History reviewed. No pertinent past medical history. History reviewed. No pertinent past surgical history. No family history on file. Social History  Substance Use Topics  . Smoking status: Current Some Day Smoker  . Smokeless tobacco: None  . Alcohol Use: Yes     Comment: occasional    Review of Systems  Constitutional: Negative for fever and chills.  Skin: Positive for wound (abscess to posterior right neck ).     Allergies  Review of patient's allergies indicates no known allergies.  Home Medications   Prior to Admission medications   Medication Sig Start Date End Date Taking? Authorizing Provider  ondansetron (ZOFRAN) 4 MG tablet Take 1 tablet (4 mg total) by mouth every 6 (six) hours. 09/09/13   Jerelyn ScottMartha Linker, MD   BP 132/82 mmHg  Pulse 58  Temp(Src) 97.5 F (36.4 C) (Oral)  Resp 16  Ht 6\' 1"  (1.854 m)  Wt 150 lb (68.04 kg)  BMI 19.79 kg/m2  SpO2 100% Physical Exam  Constitutional: He is oriented to person, place, and time. He appears well-developed and well-nourished.  HENT:  Head: Normocephalic and atraumatic.  Cardiovascular: Normal rate.   Pulmonary/Chest: Effort normal and breath sounds normal.  Abdominal: He exhibits no  distension.  Neurological: He is alert and oriented to person, place, and time.  Skin:  Well  Healing wound to the right posterior neck  Psychiatric: He has a normal mood and affect.  Nursing note and vitals reviewed.   ED Course  Procedures   DIAGNOSTIC STUDIES: Oxygen Saturation is 100% on RA, normal by my interpretation.    COORDINATION OF CARE: 10:32 AM Discussed treatment plan with pt at bedside and pt agreed to plan.   Labs Review Labs Reviewed - No data to display  Imaging Review No results found.    EKG Interpretation None      MDM   Final diagnoses:  Wound check, abscess    Wound is healing without any problem. Don't further intervention is needed a this time.   I personally performed the services described in this documentation, which was scribed in my presence. The recorded information has been reviewed and is accurate.   Teressa LowerVrinda Eliyanna Ault, NP 10/31/14 1050  Gwyneth SproutWhitney Plunkett, MD 10/31/14 [phone]

## 2015-05-09 IMAGING — CT CT HEAD W/O CM
2 series · 16 of 30 positions shown, 19 images · non-contrast
Comparison: None.

CLINICAL DATA: Acute onset of syncope x 2. Hit posterior aspect of
head on concrete. Initial encounter.

EXAM:
CT HEAD WITHOUT CONTRAST
TECHNIQUE: Contiguous axial images were obtained from the base of the skull
through the vertex without intravenous contrast.

[Series 2: head w/o · axial · non-contrast · 0.45mm/px · z∈[+1464,+1584]mm · 9 of 31 slices shown, 12 images]
[im 4/31  brain]
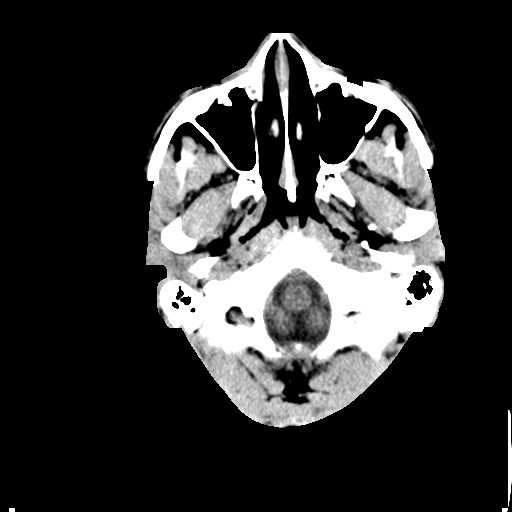
[im 4/31  bone]
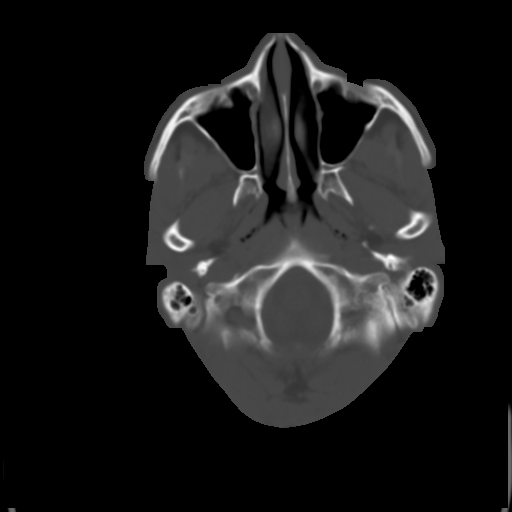
[im 7/31  brain]
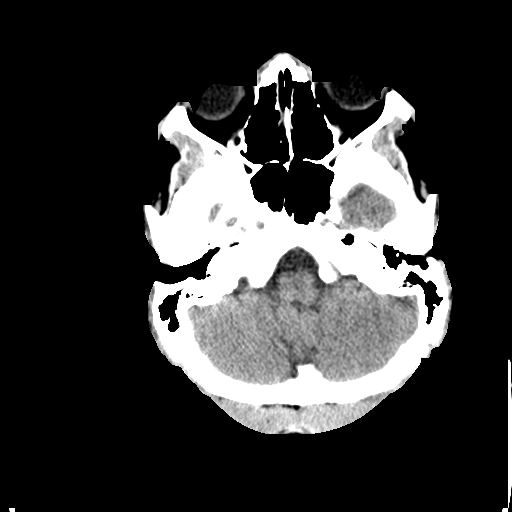
[im 10/31  brain]
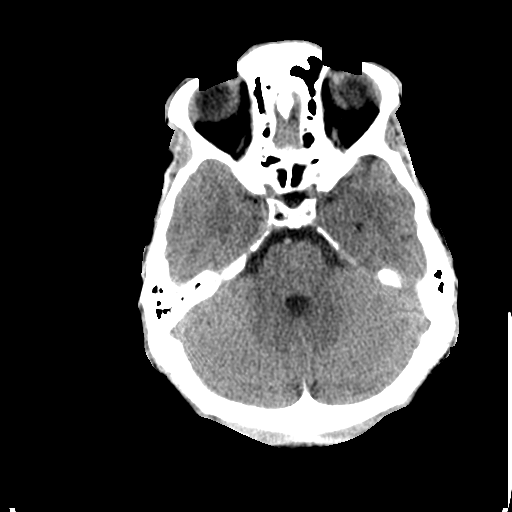
[im 13/31  brain]
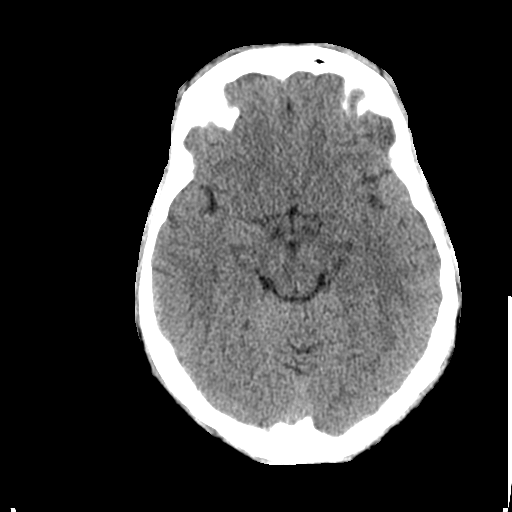
[im 16/31  brain]
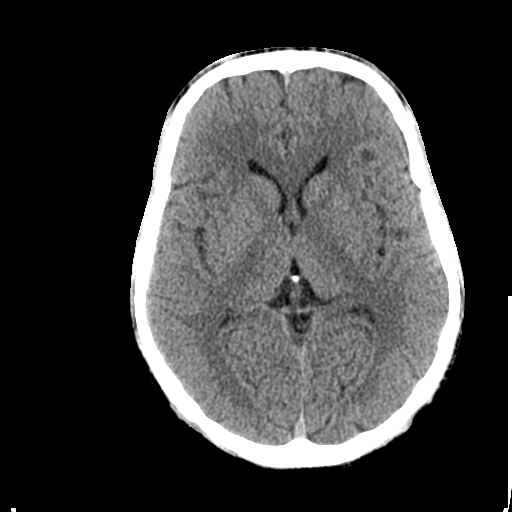
[im 16/31  bone]
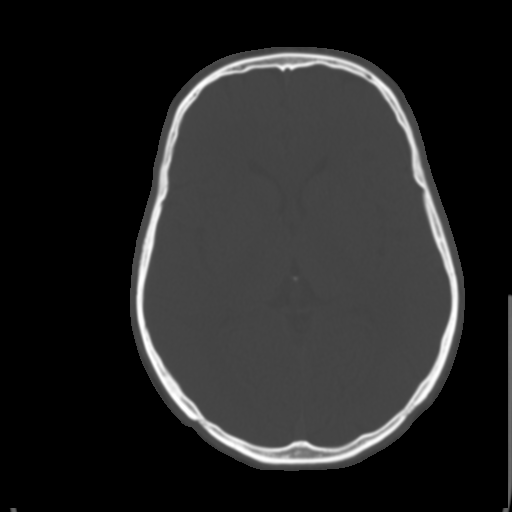
[im 19/31  brain]
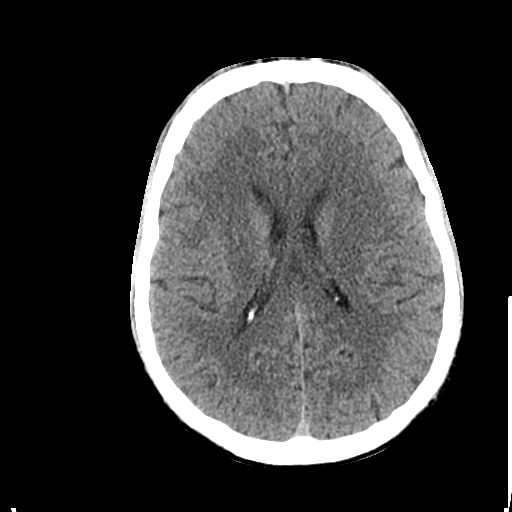
[im 22/31  brain]
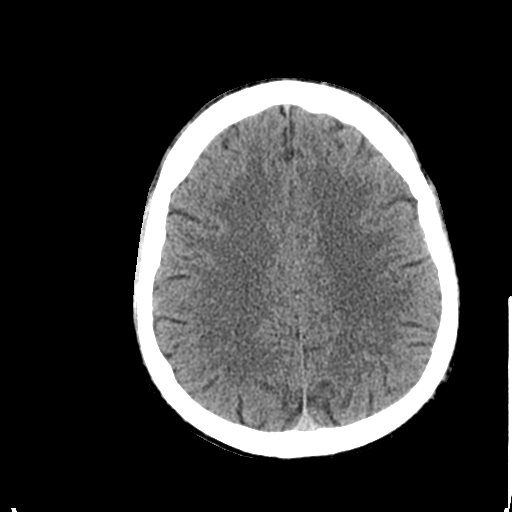
[im 25/31  brain]
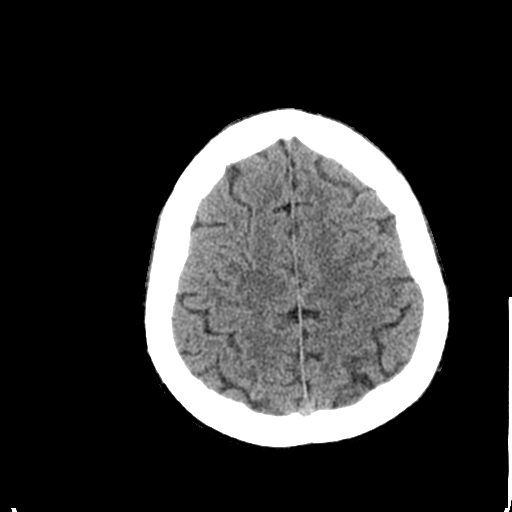
[im 28/31  brain]
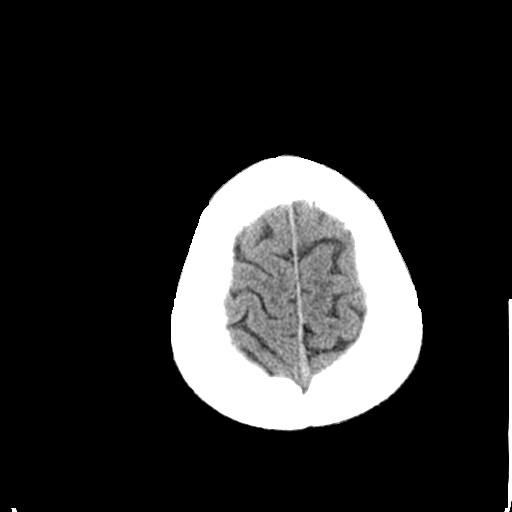
[im 28/31  bone]
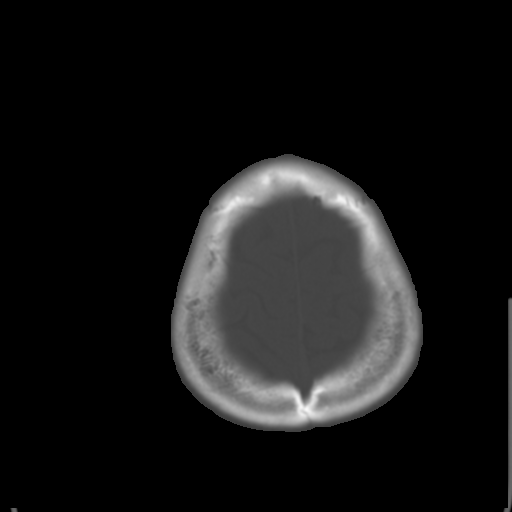

[Series 3: bone windows · axial · 0.45mm/px · z∈[+1464,+1563]mm · 7 of 51 slices shown]
[im 6/51  bone]
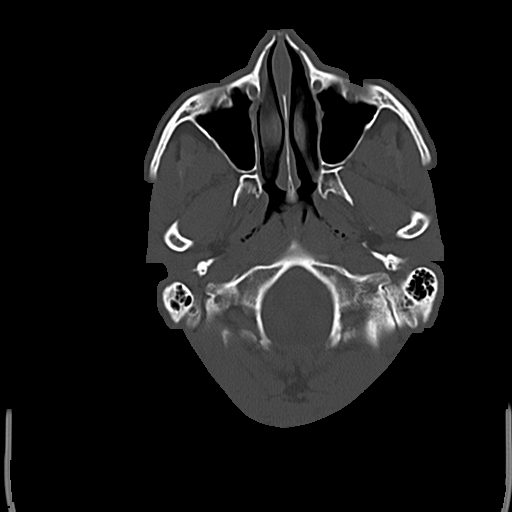
[im 12/51  bone]
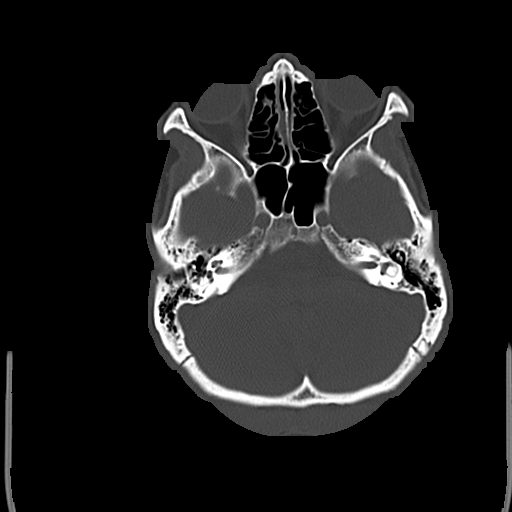
[im 17/51  bone]
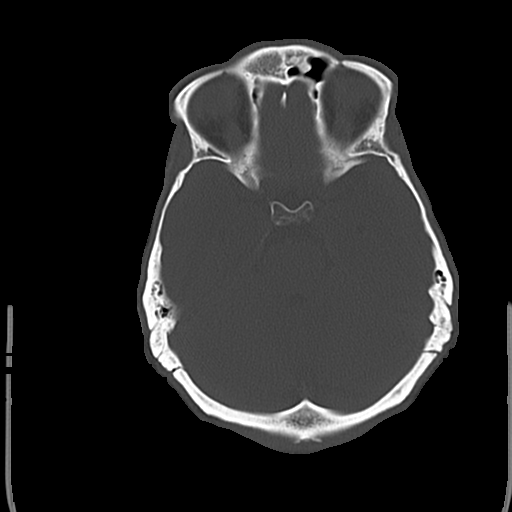
[im 23/51  bone]
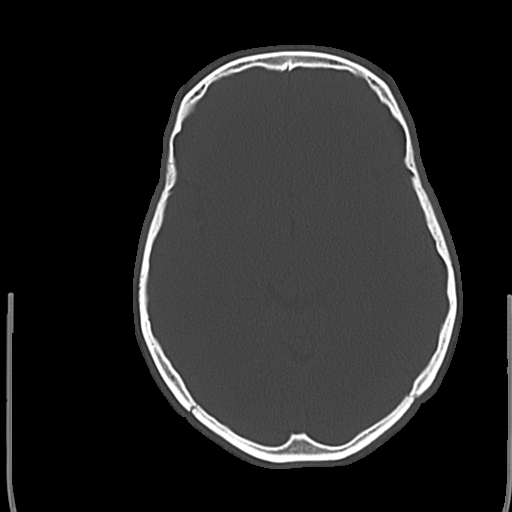
[im 28/51  bone]
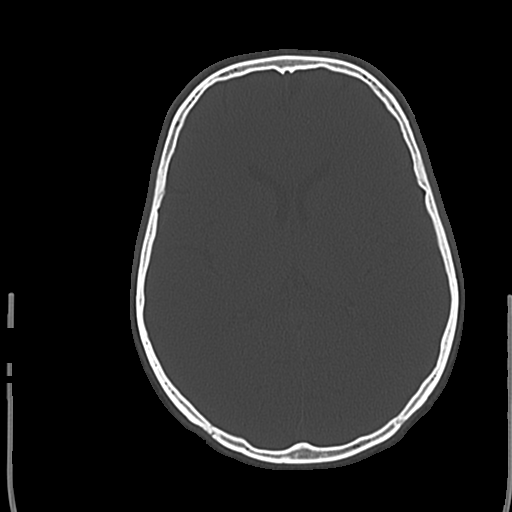
[im 34/51  bone]
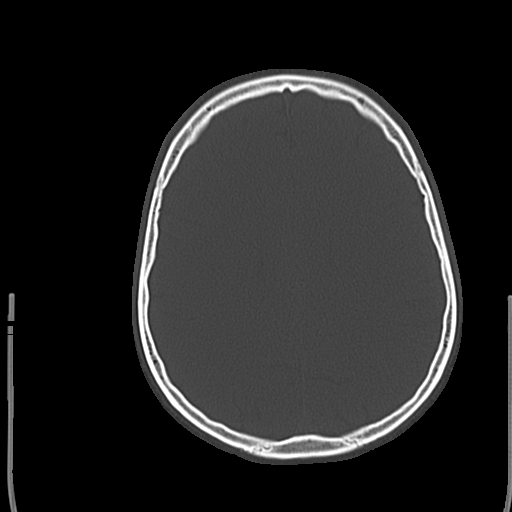
[im 39/51  bone]
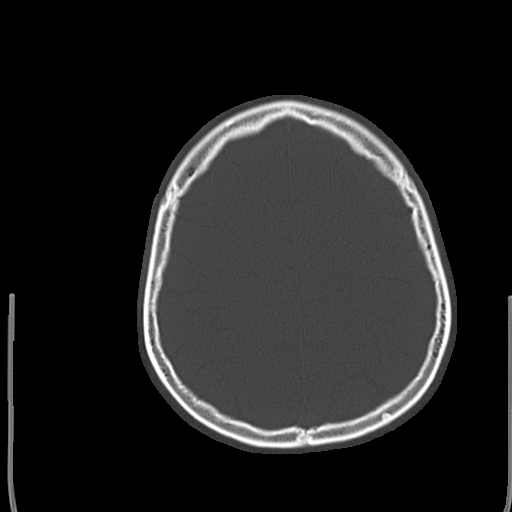

[16 of 30 positions shown; findings below may reference images not displayed]

FINDINGS: There is no evidence of acute infarction, mass lesion, or intra- or
extra-axial hemorrhage on CT.

Minimally increased attenuation at the left cerebral peduncle is
thought to be artifactual in nature.

The posterior fossa, including the cerebellum, brainstem and fourth
ventricle, is within normal limits. The third and lateral
ventricles, and basal ganglia are unremarkable in appearance. The
cerebral hemispheres are symmetric in appearance, with normal
gray-white differentiation. No mass effect or midline shift is seen.

There is no evidence of fracture; visualized osseous structures are
unremarkable in appearance. The orbits are within normal limits. The
paranasal sinuses and mastoid air cells are well-aerated. No
significant soft tissue abnormalities are seen.
IMPRESSION: No evidence of traumatic intracranial injury or fracture.

## 2015-06-08 ENCOUNTER — Emergency Department (HOSPITAL_COMMUNITY)
Admission: EM | Admit: 2015-06-08 | Discharge: 2015-06-08 | Disposition: A | Discharge status: Home / Self Care | Payer: Self-pay | Attending: Emergency Medicine | Admitting: Emergency Medicine

## 2015-06-08 ENCOUNTER — Encounter (HOSPITAL_COMMUNITY): Payer: Self-pay | Admitting: Emergency Medicine

## 2015-06-08 DIAGNOSIS — F17200 Nicotine dependence, unspecified, uncomplicated: Secondary | ICD-10-CM | POA: Insufficient documentation

## 2015-06-08 DIAGNOSIS — Z79899 Other long term (current) drug therapy: Secondary | ICD-10-CM | POA: Insufficient documentation

## 2015-06-08 DIAGNOSIS — L905 Scar conditions and fibrosis of skin: Secondary | ICD-10-CM | POA: Insufficient documentation

## 2015-06-08 DIAGNOSIS — K644 Residual hemorrhoidal skin tags: Secondary | ICD-10-CM | POA: Insufficient documentation

## 2015-06-08 NOTE — ED Provider Notes (Signed)
CSN: [id number]     Arrival date & time 06/08/15  0111 History   First MD Initiated Contact with Patient 06/08/15 0144     Chief Complaint  Patient presents with  . Neck Pain     (Consider location/radiation/quality/duration/timing/severity/associated sxs/prior Treatment) HPI Comments: Patient presents with multiple complaints. He complains of a non-healing wound to posterior neck x 1 year with concern additional treatment is needed. No drainage, redness, recent change to the wound. He complains of a growth on the rectum but is unsure how long it has been there. No pain, bleeding.   Patient is a 30 y.o. male presenting with neck pain. The history is provided by the patient. No language interpreter was used.  Neck Pain   History reviewed. No pertinent past medical history. History reviewed. No pertinent past surgical history. No family history on file. Social History  Substance Use Topics  . Smoking status: Current Some Day Smoker  . Smokeless tobacco: None  . Alcohol Use: Yes     Comment: occasional    Review of Systems  Gastrointestinal:       Complains of painless rectal growth.  Musculoskeletal: Negative for neck stiffness.  Skin: Positive for wound.      Allergies  Review of patient's allergies indicates no known allergies.  Home Medications   Prior to Admission medications   Medication Sig Start Date End Date Taking? Authorizing Provider  ondansetron (ZOFRAN) 4 MG tablet Take 1 tablet (4 mg total) by mouth every 6 (six) hours. 09/09/13   Jerelyn ScottMartha Linker, MD   BP 154/103 mmHg  Pulse 94  Temp(Src) 97.7 F (36.5 C) (Oral)  Resp 24  Ht 6' (1.829 m)  Wt 62.398 kg  BMI 18.65 kg/m2  SpO2 100% Physical Exam  Constitutional: He is oriented to person, place, and time. He appears well-developed and well-nourished. No distress.  Genitourinary:  Small, normopigmented, pedunculated, smooth growth on anal ring c/w skin tag.   Neurological: He is alert and oriented to  person, place, and time.  Skin: Skin is warm and dry.  Well healed scarring to posterior neck with central depression. No redness, drainage or fluctuance.   Psychiatric: He has a normal mood and affect.    ED Course  Procedures (including critical care time) Labs Review Labs Reviewed - No data to display  Imaging Review No results found. I have personally reviewed and evaluated these images and lab results as part of my medical decision-making.   EKG Interpretation None      MDM   Final diagnoses:  None    1. Scar, posterior neck 2. Rectal skin tag  The patient presents with benign complaints that are not new or require urgent treatment. Will refer to primary care for further routine care.   Elpidio AnisShari Nakesha Ebrahim, PA-C 06/08/15 [phone]  Gilda Creasehristopher J Pollina, MD 06/08/15 [phone]

## 2015-06-08 NOTE — Discharge Instructions (Signed)
FOLLOW UP WITH PRIMARY CARE FOR FURTHER TREATMENT OF NECK SCAR AND RECTAL SKIN TAG.

## 2015-06-08 NOTE — ED Notes (Addendum)
Pt. reports intermittent posterior neck pain onset last year , denies recent injury , pt. added rash at right thigh onset this week  and rectal skin tag.

## 2018-11-25 ENCOUNTER — Other Ambulatory Visit: Payer: Self-pay

## 2018-11-25 ENCOUNTER — Emergency Department (HOSPITAL_COMMUNITY)
Admission: EM | Admit: 2018-11-25 | Discharge: 2018-11-27 | Disposition: A | Discharge status: Home / Self Care | Payer: Self-pay | Attending: Emergency Medicine | Admitting: Emergency Medicine

## 2018-11-25 ENCOUNTER — Encounter (HOSPITAL_COMMUNITY): Payer: Self-pay | Admitting: *Deleted

## 2018-11-25 DIAGNOSIS — F23 Brief psychotic disorder: Secondary | ICD-10-CM

## 2018-11-25 DIAGNOSIS — Z20828 Contact with and (suspected) exposure to other viral communicable diseases: Secondary | ICD-10-CM | POA: Insufficient documentation

## 2018-11-25 DIAGNOSIS — F209 Schizophrenia, unspecified: Secondary | ICD-10-CM | POA: Insufficient documentation

## 2018-11-25 LAB — BASIC METABOLIC PANEL
Anion gap: 6 (ref 5–15)
BUN: 8 mg/dL (ref 6–20)
CO2: 28 mmol/L (ref 22–32)
Calcium: 8.9 mg/dL (ref 8.9–10.3)
Chloride: 104 mmol/L (ref 98–111)
Creatinine, Ser: 0.69 mg/dL (ref 0.61–1.24)
GFR calc Af Amer: >60 mL/min (ref >60)
GFR calc non Af Amer: >60 mL/min (ref >60)
Glucose, Bld: 92 mg/dL (ref 70–99)
Potassium: 3.8 mmol/L (ref 3.5–5.1)
Sodium: 138 mmol/L (ref 135–145)

## 2018-11-25 LAB — URINALYSIS, ROUTINE W REFLEX MICROSCOPIC
Bilirubin Urine: NEGATIVE
Glucose, UA: NEGATIVE mg/dL
Hgb urine dipstick: NEGATIVE
Ketones, ur: NEGATIVE mg/dL
Leukocytes,Ua: NEGATIVE
Nitrite: NEGATIVE
Protein, ur: NEGATIVE mg/dL
Specific Gravity, Urine: 1.014 (ref 1.005–1.030)
pH: 8 (ref 5.0–8.0)

## 2018-11-25 LAB — CBC WITH DIFFERENTIAL/PLATELET
Abs Immature Granulocytes: 0 10*3/uL (ref 0.00–0.07)
Basophils Absolute: 0 10*3/uL (ref 0.0–0.1)
Basophils Relative: 1 %
Eosinophils Absolute: 0.1 10*3/uL (ref 0.0–0.5)
Eosinophils Relative: 4 %
HCT: 39.4 % (ref 39.0–52.0)
Hemoglobin: 12.2 g/dL — ABNORMAL LOW (ref 13.0–17.0)
Immature Granulocytes: 0 %
Lymphocytes Relative: 43 %
Lymphs Abs: 1.6 10*3/uL (ref 0.7–4.0)
MCH: 31.1 pg (ref 26.0–34.0)
MCHC: 31 g/dL (ref 30.0–36.0)
MCV: 100.5 fL — ABNORMAL HIGH (ref 80.0–100.0)
Monocytes Absolute: 0.4 10*3/uL (ref 0.1–1.0)
Monocytes Relative: 11 %
Neutro Abs: 1.5 10*3/uL — ABNORMAL LOW (ref 1.7–7.7)
Neutrophils Relative %: 41 %
Platelets: 265 10*3/uL (ref 150–400)
RBC: 3.92 MIL/uL — ABNORMAL LOW (ref 4.22–5.81)
RDW: 13.7 % (ref 11.5–15.5)
WBC: 3.6 10*3/uL — ABNORMAL LOW (ref 4.0–10.5)
nRBC: 0 % (ref 0.0–0.2)

## 2018-11-25 LAB — RAPID URINE DRUG SCREEN, HOSP PERFORMED
Amphetamines: NOT DETECTED
Barbiturates: NOT DETECTED
Benzodiazepines: NOT DETECTED
Cocaine: NOT DETECTED
Opiates: NOT DETECTED
Tetrahydrocannabinol: NOT DETECTED

## 2018-11-25 LAB — ETHANOL: Alcohol, Ethyl (B): <10 mg/dL (ref <10)

## 2018-11-25 MED ORDER — ZIPRASIDONE MESYLATE 20 MG IM SOLR: 10.0000 mg | Freq: Once | INTRAMUSCULAR | Status: DC
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
## 2018-11-25 NOTE — ED Notes (Signed)
Pt given graham crackers and Sprite per his request for a snack.

## 2018-11-25 NOTE — ED Notes (Signed)
Pt observed laughing and crying loudly. RN walked into room and pt was talking but no one was in the room. Sitter notified RN that pt has been doing this since he arrived to the room.
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
## 2018-11-25 NOTE — ED Triage Notes (Signed)
Family member states he has been disorganized, having bursts of anger and acting inappropriate, has been seen at [facility] but does not go regularly

## 2018-11-25 NOTE — ED Notes (Signed)
Pt asked RN why he was here. RN asked pt if he remembered coming in and he said "yea" but didn't comment anymore. Pt informed of what brought him here and he stated, "ok". Pt calm and cooperative at this time.

## 2018-11-25 NOTE — ED Notes (Signed)
Security given money, 2 cards, phone, and drivers license.

## 2018-11-26 LAB — SARS CORONAVIRUS 2 BY RT PCR (HOSPITAL ORDER, PERFORMED IN ~~LOC~~ HOSPITAL LAB): SARS Coronavirus 2: NEGATIVE

## 2018-11-26 MED ORDER — AMPHETAMINE-DEXTROAMPHETAMINE 10 MG PO TABS
5.0000 mg | ORAL_TABLET | Freq: Two times a day (BID) | ORAL | Status: DC
  Administered 2018-11-26 – 2018-11-27 (×3): 5 mg via ORAL
  Filled 2018-11-26 (×3): qty 1

## 2018-11-26 NOTE — [facility] Counselor (Signed)
TTS reassessment: Patient is alert and oriented x 4. He is dressed in scrubs, sitting upright in bed. He is calm and cooperative during assessment. He denies SI/HI/AVH. His thoughts are disorganized and his speech is rambling. Patient pauses at times- appears to be thought blocking. Patient denies any mental illness, however later contradicts himself and states that he is prescribed medications and has been hospitalized before.  Based on collateral information from family patient continues to meet in patient criteria.

## 2018-11-26 NOTE — ED Notes (Signed)
IVC paperwork faxed to magistrate at this time 

## 2018-11-26 NOTE — ED Notes (Signed)
Mother visiting with patient at this time 

## 2018-11-26 NOTE — ED Notes (Signed)
Patient continue to make multiple restroom trips. Patient continues to be inappropriate in response to stimulation and conversation. Laughing inappropriately. Will continue to monitor.

## 2018-11-26 NOTE — ED Notes (Signed)
IVC papers served at this time from [place] office.

## 2018-11-26 NOTE — Progress Notes (Signed)
This patient continues to meet inpatient criteria. CSW faxed information to the following facilities:   [facility]  This patient is currently under IVC.  Stephanie Acre, LCSW-A Clinical Social Worker

## 2018-11-26 NOTE — ED Provider Notes (Addendum)
The affidavit and petition for involuntary commitment and certificate had not been filled out.  I filled that out.  Sitter reports he was very agitated earlier and was turning the lights off and on and rambling around the room.  The PA who saw him earlier stated he appeared to be responding to people that were not in the room.  Currently he is laying in bed and is quiet.   Rolland Porter, MD 11/26/18 5277    Rolland Porter, MD 11/26/18 [phone]

## 2018-11-26 NOTE — ED Notes (Signed)
Verified with [facility] pharmacy that patient is prescribed Adderal 5mg  BID. Provider to order medication for ED to administer.

## 2018-11-26 NOTE — ED Notes (Signed)
Patient's mother called at this time. She left her number with this RN so patient could call her. Patient provided with number and cordless phone and spoke with his mother.

## 2018-11-26 NOTE — ED Notes (Signed)
Patient resting in bed at this time with no needs. This RN did not disturb to promote rest. Sitter present at bedside. Repeat vitals obtained. Will continue to monitor.

## 2018-11-26 NOTE — ED Notes (Signed)
[place] assessment being completed at this time via telepsych.

## 2018-11-26 NOTE — ED Notes (Signed)
Patient showered and has made two phone calls

## 2018-11-27 ENCOUNTER — Other Ambulatory Visit: Payer: Self-pay

## 2018-11-27 ENCOUNTER — Inpatient Hospital Stay (HOSPITAL_COMMUNITY)
Admission: AD | Admit: 2018-11-27 | Discharge: 2018-12-03 | DRG: 885 | Disposition: A | Discharge status: Home / Self Care | Payer: Federal, State, Local not specified - Other | Source: Intra-hospital | Attending: Psychiatry | Admitting: Psychiatry

## 2018-11-27 ENCOUNTER — Encounter (HOSPITAL_COMMUNITY): Payer: Self-pay

## 2018-11-27 DIAGNOSIS — G4700 Insomnia, unspecified: Secondary | ICD-10-CM | POA: Diagnosis present

## 2018-11-27 DIAGNOSIS — Z9119 Patient's noncompliance with other medical treatment and regimen: Secondary | ICD-10-CM | POA: Diagnosis not present

## 2018-11-27 DIAGNOSIS — Z20828 Contact with and (suspected) exposure to other viral communicable diseases: Secondary | ICD-10-CM | POA: Diagnosis present

## 2018-11-27 DIAGNOSIS — F23 Brief psychotic disorder: Secondary | ICD-10-CM | POA: Diagnosis present

## 2018-11-27 DIAGNOSIS — D709 Neutropenia, unspecified: Secondary | ICD-10-CM | POA: Diagnosis not present

## 2018-11-27 DIAGNOSIS — F200 Paranoid schizophrenia: Principal | ICD-10-CM | POA: Diagnosis present

## 2018-11-27 DIAGNOSIS — D72819 Decreased white blood cell count, unspecified: Secondary | ICD-10-CM | POA: Diagnosis present

## 2018-11-27 DIAGNOSIS — D539 Nutritional anemia, unspecified: Secondary | ICD-10-CM | POA: Diagnosis present

## 2018-11-27 DIAGNOSIS — F29 Unspecified psychosis not due to a substance or known physiological condition: Secondary | ICD-10-CM | POA: Diagnosis present

## 2018-11-27 MED ORDER — HYDROXYZINE HCL 25 MG PO TABS: 25.0000 mg | ORAL_TABLET | Freq: Three times a day (TID) | ORAL | Status: DC | PRN

## 2018-11-27 MED ORDER — TRAZODONE HCL 50 MG PO TABS
50.0000 mg | ORAL_TABLET | Freq: Every evening | ORAL | Status: DC | PRN
  Filled 2018-11-27: qty 1

## 2018-11-27 NOTE — Progress Notes (Signed)
Pt belongings locked up. Pt had a bottle with 28 blue pills in it .

## 2018-11-27 NOTE — ED Notes (Signed)
Patient responding to internal stimuli. Seen standing in room talking and giggling to himself. When asked what he saw or heard he would stare and not respond.

## 2018-11-27 NOTE — Progress Notes (Signed)
   11/27/18 2300  Psych Admission Type (Psych Patients Only)  Admission Status Voluntary  Psychosocial Assessment  Patient Complaints None  Eye Contact Fair  Facial Expression Anxious  Affect Anxious  Speech Argumentative  Interaction Cautious  Motor Activity Slow  Appearance/Hygiene Disheveled  Behavior Characteristics Cooperative  Mood Pleasant;Anxious;Suspicious  Aggressive Behavior  Effect No apparent injury  Thought Process  Coherency Disorganized  Content Preoccupation  Delusions WDL  Perception WDL  Hallucination Auditory  Judgment Poor  Confusion WDL  Danger to Self  Current suicidal ideation? Denies  Danger to Others  Danger to Others None reported or observed   Pt minimal on the unit, pt expressed his desire for his Adderall. Pt appeared paranoid on the unit this evening .
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
## 2018-11-28 DIAGNOSIS — F200 Paranoid schizophrenia: Principal | ICD-10-CM

## 2018-11-28 LAB — TSH: TSH: 1.271 u[IU]/mL (ref 0.350–4.500)

## 2018-11-28 LAB — LIPID PANEL
Cholesterol: 161 mg/dL (ref 0–200)
HDL: 55 mg/dL (ref >40)
LDL Cholesterol: 95 mg/dL (ref 0–99)
Total CHOL/HDL Ratio: 2.9 RATIO
Triglycerides: 53 mg/dL (ref <150)
VLDL: 11 mg/dL (ref 0–40)

## 2018-11-28 LAB — HEMOGLOBIN A1C
Hgb A1c MFr Bld: 5.5 % (ref 4.8–5.6)
Mean Plasma Glucose: 111.15 mg/dL

## 2018-11-28 MED ORDER — LORAZEPAM 1 MG PO TABS
2.0000 mg | ORAL_TABLET | ORAL | Status: DC | PRN
  Administered 2018-12-02: 2 mg via ORAL
  Filled 2018-11-28: qty 2

## 2018-11-28 MED ORDER — ARIPIPRAZOLE 2 MG PO TABS
2.0000 mg | ORAL_TABLET | Freq: Once | ORAL | Status: AC
  Administered 2018-11-28: 2 mg via ORAL
  Filled 2018-11-28: qty 1

## 2018-11-28 MED ORDER — RISPERIDONE 3 MG PO TABS
3.0000 mg | ORAL_TABLET | Freq: Two times a day (BID) | ORAL | Status: DC
  Administered 2018-11-28 – 2018-12-01 (×7): 3 mg via ORAL
  Filled 2018-11-28 (×12): qty 1

## 2018-11-28 MED ORDER — BENZTROPINE MESYLATE 1 MG PO TABS
1.0000 mg | ORAL_TABLET | Freq: Two times a day (BID) | ORAL | Status: DC
  Administered 2018-11-28 – 2018-12-02 (×9): 1 mg via ORAL
  Filled 2018-11-28 (×6): qty 1
  Filled 2018-11-28 (×2): qty 14
  Filled 2018-11-28 (×6): qty 1
  Filled 2018-11-28: qty 14
  Filled 2018-11-28: qty 1
  Filled 2018-11-28: qty 14
  Filled 2018-11-28: qty 1

## 2018-11-28 MED ORDER — OMEGA-3-ACID ETHYL ESTERS 1 G PO CAPS
1.0000 g | ORAL_CAPSULE | Freq: Two times a day (BID) | ORAL | Status: DC
  Administered 2018-11-28 – 2018-12-02 (×9): 1 g via ORAL
  Filled 2018-11-28 (×16): qty 1

## 2018-11-28 MED ORDER — LORAZEPAM 2 MG/ML IJ SOLN: 2.0000 mg | INTRAMUSCULAR | Status: DC | PRN

## 2018-11-28 MED ORDER — TEMAZEPAM 30 MG PO CAPS
30.0000 mg | ORAL_CAPSULE | Freq: Every day | ORAL | Status: DC
  Administered 2018-11-29 – 2018-12-02 (×4): 30 mg via ORAL
  Filled 2018-11-28 (×5): qty 1

## 2018-11-28 NOTE — Progress Notes (Signed)
Recreation Therapy Notes  Date: 11.19.20 Time: 0945 Location: [address] Dayroom  Group Topic: Leisure Education  Goal Area(s) Addresses:  Patient will identify positive leisure activities.  Patient will identify one positive benefit of participation in leisure activities.   Intervention: Leisure Group Game  Activity: Leisure Pictionary.  LRT and patients played a game of pictionary.  One person would get a word from the container and draw it on the board, the person that guesses the picture would get the next turn.  Each person had one minute.  Education:  Leisure Education, Discharge Planning  Education Outcome: Acknowledges education/In group clarification offered/Needs additional education  Clinical Observations/Feedback: Pt did not attend group.    Allyanna Appleman, LRT/CTRS         Chelsie Burel A 11/28/2018 11:51 AM 

## 2018-11-28 NOTE — Progress Notes (Signed)
   11/28/18 2100  Psych Admission Type (Psych Patients Only)  Admission Status Voluntary  Psychosocial Assessment  Patient Complaints None  Eye Contact Fair  Facial Expression Anxious  Affect Anxious  Speech Argumentative  Interaction Cautious  Motor Activity Slow  Appearance/Hygiene Disheveled  Behavior Characteristics Cooperative  Mood Anxious  Aggressive Behavior  Effect No apparent injury  Thought Process  Coherency Disorganized  Content Preoccupation  Delusions WDL  Perception WDL  Hallucination Auditory  Judgment Poor  Confusion WDL  Danger to Self  Current suicidal ideation? Denies  Danger to Others  Danger to Others None reported or observed
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
## 2018-11-28 NOTE — [facility] Group Notes (Cosign Needed)
[facility] LCSW Group Therapy Note  Date/Time: 1:30pm 11/28/2018  Type of Therapy/Topic:  Group Therapy:  Feelings about Diagnosis  Participation Level:  Did Not Attend   Mood: Pt did not attend    Description of Group:    This group will allow patients to explore their thoughts and feelings about diagnoses they have received. Patients will be guided to explore their level of understanding and acceptance of these diagnoses. Facilitator will encourage patients to process their thoughts and feelings about the reactions of others to their diagnosis, and will guide patients in identifying ways to discuss their diagnosis with significant others in their lives. This group will be process-oriented, with patients participating in exploration of their own experiences as well as giving and receiving support and challenge from other group members.   Therapeutic Goals: 1. Patient will demonstrate understanding of diagnosis as evidence by identifying two or more symptoms of the disorder:  2. Patient will be able to express two feelings regarding the diagnosis 3. Patient will demonstrate ability to communicate their needs through discussion and/or role plays  Summary of Patient Progress:  Pt did not attend group session.     Therapeutic Modalities:   Cognitive Behavioral Therapy Brief Therapy Feelings Identification   Yanessa Hocevar, MSW intern   

## 2018-11-28 NOTE — Progress Notes (Signed)
Psychoeducational Group Note  Date:  11/28/2018 Time:  2037  Group Topic/Focus:  Wrap-Up Group:   The focus of this group is to help patients review their daily goal of treatment and discuss progress on daily workbooks.  Participation Level: Did Not Attend  Participation Quality:  Not Applicable  Affect:  Not Applicable  Cognitive:  Not Applicable  Insight:  Not Applicable  Engagement in Group: Not Applicable  Additional Comments:  The patient did not attend group this evening since he was asleep.   Archie Balboa S 11/28/2018, 8:37 PM

## 2018-11-28 NOTE — [facility] Suicide Risk Assessment (Signed)
[facility] INPATIENT:  Family/Significant Other Suicide Prevention Education  Suicide Prevention Education:  Patient Refusal for Family/Significant Other Suicide Prevention Education: The patient Wesley Peterson has refused to provide written consent for family/significant other to be provided Family/Significant Other Suicide Prevention Education during admission and/or prior to discharge.  Physician notified.  Joanne Chars 11/28/2018, 9:41 AM
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
## 2018-11-28 NOTE — [facility] Suicide Risk Assessment (Signed)
[name] Admission Suicide Risk Assessment   Nursing information obtained from:  Patient Demographic factors:  Male Current Mental Status:  NA Loss Factors:  NA Historical Factors:  NA Risk Reduction Factors:  Positive coping skills or problem solving skills  Total Time spent with patient: 45 minutes Principal Problem: Disorganized thought behavior/hallucinations/volatility and violence at home Diagnosis:  Active Problems:   Psychosis ([place])   Schizophrenia, paranoid ([place])  Subjective Data: Chronically under/untreated schizophrenic disorder  Continued Clinical Symptoms:  Alcohol Use Disorder Identification Test Final Score (AUDIT): 0 The "Alcohol Use Disorders Identification Test", Guidelines for Use in Primary Care, Second Edition.  World Pharmacologist [facility]). Score between 0-7:  no or low risk or alcohol related problems. Score between 8-15:  moderate risk of alcohol related problems. Score between 16-19:  high risk of alcohol related problems. Score 20 or above:  warrants further diagnostic evaluation for alcohol dependence and treatment.   CLINICAL FACTORS:   Schizophrenia:   Less than 61 years old Paranoid or undifferentiated type   Musculoskeletal: Strength & Muscle Tone: within normal limits Gait & Station: normal Patient leans: N/A  Psychiatric Specialty Exam: Physical Exam  Nursing note and vitals reviewed. Constitutional: He appears well-developed and well-nourished.  Cardiovascular: Normal rate and regular rhythm.    Review of Systems  Constitutional: Negative.   HENT: Negative.   Respiratory: Negative.   Cardiovascular: Negative.   Gastrointestinal: Negative.   Genitourinary: Negative.   Neurological: Negative.   Endo/Heme/Allergies: Negative.     Blood pressure (!) 112/95, pulse 100, temperature 97.8 F (36.6 C), temperature source Oral.There is no height or weight on file to calculate BMI.  General Appearance: Casual  Eye Contact:  Poor  Speech:   Slow  Volume:  Decreased  Mood:  Apathetic and disinterested in the interview process  Affect:  Blunt  Thought Process:  Irrelevant and Descriptions of Associations: Circumstantial  Orientation:  Other:  Person place situation  Thought Content:  Illogical and Hallucinations: Auditory thought to be suffering from auditory hallucinations as recently as yesterday, tremendous poverty of content to speech and thought  Suicidal Thoughts:  No  Homicidal Thoughts:  No  Memory:  Immediate;   Poor Recent;   Poor Remote;   Poor  Judgement:  Impaired  Insight:  Lacking  Psychomotor Activity:  Normal  Concentration:  Concentration: Poor and Attention Span: Poor  Recall:  Poor  Fund of Knowledge:  Poor  Language: Generally intact language skills just poverty of content of speech  Akathisia:  Negative  Handed:  Right  AIMS (if indicated):     Assets:  Physical Health Resilience Social Support  ADL's:  Intact  Cognition:  WNL  Sleep:  Number of Hours: 6.75     COGNITIVE FEATURES THAT CONTRIBUTE TO RISK:  Loss of executive function    SUICIDE RISK:   Minimal: No identifiable suicidal ideation.  Patients presenting with no risk factors but with morbid ruminations; may be classified as minimal risk based on the severity of the depressive symptoms  PLAN OF CARE: admit  I certify that inpatient services furnished can reasonably be expected to improve the patient's condition.   Wesley Hai, MD 11/28/2018, 7:42 AM

## 2018-11-29 NOTE — Progress Notes (Signed)
Adult Psychoeducational Group Note  Date:  11/29/2018 Time:  9:34 PM  Group Topic/Focus:  Wrap-Up Group:   The focus of this group is to help patients review their daily goal of treatment and discuss progress on daily workbooks.  Participation Level:  Minimal  Participation Quality:  Appropriate  Affect:  Appropriate  Cognitive:  Appropriate  Insight: Appropriate  Engagement in Group:  Developing/Improving  Modes of Intervention:  Discussion  Additional Comments:  Pt stated his goal for today was to focus on his treatment plan. Pt stated he accomplished his goal today. Pt stated his relationship with his family has improved since he was admitted her. Pt stated he felt better about himself today. Pt rated his over all day a 10. Pt stated his appetite was pretty good today. Pt stated he slept pretty good last night. Pt stated his goal for tonight was to get some rest. Pt stated he was in no physical pain tonight. Pt stated he was not hearing or seeing anything that was not there. Pt stated he had no thoughts of harming himself or others. Pt stated if anything change he would alert staff.  Candy Sledge 11/29/2018, 9:34 PM

## 2018-11-29 NOTE — Tx Team (Signed)
Interdisciplinary Treatment and Diagnostic Plan Update  11/29/2018 Time of Session: 10:00 am  Wesley Peterson MRN: [id number]  Principal Diagnosis: <principal problem not specified>  Secondary Diagnoses: Active Problems:   Psychosis ([place])   Schizophrenia, paranoid ([place])   Current Medications:  Current Facility-Administered Medications  Medication Dose Route Frequency Provider Last Rate Last Dose  . benztropine (COGENTIN) tablet 1 mg  1 mg Oral BID Johnn Hai, MD   1 mg at 11/28/18 1758  . hydrOXYzine (ATARAX/VISTARIL) tablet 25 mg  25 mg Oral TID PRN Anike, Adaku C, NP      . LORazepam (ATIVAN) tablet 2 mg  2 mg Oral Q4H PRN Johnn Hai, MD       Or  . LORazepam (ATIVAN) injection 2 mg  2 mg Intramuscular Q4H PRN Johnn Hai, MD      . omega-3 acid ethyl esters (LOVAZA) capsule 1 g  1 g Oral BID Johnn Hai, MD   1 g at 11/28/18 1758  . risperiDONE (RISPERDAL) tablet 3 mg  3 mg Oral BID Johnn Hai, MD   3 mg at 11/28/18 1758  . temazepam (RESTORIL) capsule 30 mg  30 mg Oral QHS Johnn Hai, MD      . traZODone (DESYREL) tablet 50 mg  50 mg Oral QHS PRN Anike, Adaku C, NP       PTA Medications: Medications Prior to Admission  Medication Sig Dispense Refill Last Dose  . amphetamine-dextroamphetamine (ADDERALL) 5 MG tablet Take 5 mg by mouth 2 (two) times daily.       Patient Stressors:    Patient Strengths:    Treatment Modalities: Medication Management, Group therapy, Case management,  1 to 1 session with clinician, Psychoeducation, Recreational therapy.   Physician Treatment Plan for Primary Diagnosis: <principal problem not specified> Long Term Goal(s): Improvement in symptoms so as ready for discharge Improvement in symptoms so as ready for discharge   Short Term Goals: Ability to disclose and discuss suicidal ideas Ability to demonstrate self-control will improve Ability to identify and develop effective coping behaviors will improve Ability to maintain clinical  measurements within normal limits will improve Compliance with prescribed medications will improve Ability to demonstrate self-control will improve Ability to identify and develop effective coping behaviors will improve Ability to maintain clinical measurements within normal limits will improve Compliance with prescribed medications will improve Ability to identify triggers associated with substance abuse/mental health issues will improve  Medication Management: Evaluate patient's response, side effects, and tolerance of medication regimen.  Therapeutic Interventions: 1 to 1 sessions, Unit Group sessions and Medication administration.  Evaluation of Outcomes: Not Met  Physician Treatment Plan for Secondary Diagnosis: Active Problems:   Psychosis ([place])   Schizophrenia, paranoid ([place])  Long Term Goal(s): Improvement in symptoms so as ready for discharge Improvement in symptoms so as ready for discharge   Short Term Goals: Ability to disclose and discuss suicidal ideas Ability to demonstrate self-control will improve Ability to identify and develop effective coping behaviors will improve Ability to maintain clinical measurements within normal limits will improve Compliance with prescribed medications will improve Ability to demonstrate self-control will improve Ability to identify and develop effective coping behaviors will improve Ability to maintain clinical measurements within normal limits will improve Compliance with prescribed medications will improve Ability to identify triggers associated with substance abuse/mental health issues will improve     Medication Management: Evaluate patient's response, side effects, and tolerance of medication regimen.  Therapeutic Interventions: 1 to 1 sessions, Unit Group sessions and  Medication administration.  Evaluation of Outcomes: Not Met   RN Treatment Plan for Primary Diagnosis: <principal problem not specified> Long Term Goal(s):  Knowledge of disease and therapeutic regimen to maintain health will improve  Short Term Goals: Ability to participate in decision making will improve, Ability to verbalize feelings will improve, Ability to disclose and discuss suicidal ideas, Ability to identify and develop effective coping behaviors will improve and Compliance with prescribed medications will improve  Medication Management: RN will administer medications as ordered by provider, will assess and evaluate patient's response and provide education to patient for prescribed medication. RN will report any adverse and/or side effects to prescribing provider.  Therapeutic Interventions: 1 on 1 counseling sessions, Psychoeducation, Medication administration, Evaluate responses to treatment, Monitor vital signs and CBGs as ordered, Perform/monitor CIWA, COWS, AIMS and Fall Risk screenings as ordered, Perform wound care treatments as ordered.  Evaluation of Outcomes: Not Met   LCSW Treatment Plan for Primary Diagnosis: <principal problem not specified> Long Term Goal(s): Safe transition to appropriate next level of care at discharge, Engage patient in therapeutic group addressing interpersonal concerns.  Short Term Goals: Engage patient in aftercare planning with referrals and resources and Increase skills for wellness and recovery  Therapeutic Interventions: Assess for all discharge needs, 1 to 1 time with Social worker, Explore available resources and support systems, Assess for adequacy in community support network, Educate family and significant other(s) on suicide prevention, Complete Psychosocial Assessment, Interpersonal group therapy.  Evaluation of Outcomes: Not Met   Progress in Treatment: Attending groups: No. Participating in groups: No. Taking medication as prescribed: Yes. Toleration medication: Yes. Family/Significant other contact made: No, will contact:  pt declined  Patient understands diagnosis: No. Discussing  patient identified problems/goals with staff: Yes. Medical problems stabilized or resolved: Yes. Denies suicidal/homicidal ideation: Yes. Issues/concerns per patient self-inventory: No. Other:   New problem(s) identified: No, Describe:  none  New Short Term/Long Term Goal(s): Medication stabilization, elimination of SI thoughts, and development of a comprehensive mental wellness plan.   Patient Goals:  Pt did not state goals   Discharge Plan or Barriers: CSW will continue to follow up for appropriate referrals and possible discharge planning  Reason for Continuation of Hospitalization: Aggression Delusions  Hallucinations  Estimated Length of Stay: 3-5 days   Attendees: Patient: Wesley Peterson  11/29/2018 10:35 AM  Physician: Dr. Jake Samples, MD 11/29/2018 10:35 AM  Nursing: Vladimir Faster, RN 11/29/2018 10:35 AM  RN Care Manager: 11/29/2018 10:35 AM  Social Worker: Ardelle Anton, LCSW 11/29/2018 10:35 AM  Recreational Therapist:  11/29/2018 10:35 AM  Other: Ovidio Kin, MSW intern  11/29/2018 10:35 AM  Other:  11/29/2018 10:35 AM  Other: 11/29/2018 10:35 AM    Scribe for Treatment Team: Billey Chang, Student-Social Work 11/29/2018 10:35 AM

## 2018-11-29 NOTE — Progress Notes (Signed)
[facility] MD Progress Note  11/29/2018 8:22 AM Wesley Peterson  MRN:  [id number] Subjective:    This is the first admission here but the second lifetime psychiatric admission for Wesley Peterson, who presented under petition due to psychotic symptoms, he is known to have a schizophrenic type disorder.  His drug screen was negative on presentation, there was volatility at home, disorganized thought and behavior and probable hallucinations.  Today he remains extremely limited in his ability to provide information, keeps stating he "does not know" to most of my questions and has poverty of content to speech and thought  He denies hallucinations but again he goes for several minutes at a time without answering any questions and not exactly thought blocking but not at all engaged in the interview process.  No involuntary movements  Principal Problem: This panic disorder with prominent negative symptoms Diagnosis: Active Problems:   Psychosis ([facility])   Schizophrenia, paranoid ([facility])  Total Time spent with patient: 20 minutes  Past Psychiatric History: 1 prior admission and probably during incarceration had some treatment  Past Medical History: History reviewed. No pertinent past medical history. History reviewed. No pertinent surgical history. Family History: History reviewed. No pertinent family history. Family Psychiatric  History: Unknown Social History:  Social History   Substance and Sexual Activity  Alcohol Use Yes  . Alcohol/week: 1.0 standard drinks  . Types: 1 Cans of beer per week     Social History   Substance and Sexual Activity  Drug Use Never    Social History   Socioeconomic History  . Marital status: Single    Spouse name: Not on file  . Number of children: Not on file  . Years of education: Not on file  . Highest education level: Not on file  Occupational History  . Not on file  Social Needs  . Financial resource strain: Not on file  . Food insecurity    Worry: Not on file     Inability: Not on file  . Transportation needs    Medical: Not on file    Non-medical: Not on file  Tobacco Use  . Smoking status: Never Smoker  . Smokeless tobacco: Never Used  Substance and Sexual Activity  . Alcohol use: Yes    Alcohol/week: 1.0 standard drinks    Types: 1 Cans of beer per week  . Drug use: Never  . Sexual activity: Not on file  Lifestyle  . Physical activity    Days per week: Not on file    Minutes per session: Not on file  . Stress: Not on file  Relationships  . Social Musician on phone: Not on file    Gets together: Not on file    Attends religious service: Not on file    Active member of club or organization: Not on file    Attends meetings of clubs or organizations: Not on file    Relationship status: Not on file  Other Topics Concern  . Not on file  Social History Narrative  . Not on file   Additional Social History:                         Sleep: Fair  Appetite:  Fair  Current Medications: Current Facility-Administered Medications  Medication Dose Route Frequency Provider Last Rate Last Dose  . benztropine (COGENTIN) tablet 1 mg  1 mg Oral BID Malvin Johns, MD   1 mg at 11/28/18 1758  .  hydrOXYzine (ATARAX/VISTARIL) tablet 25 mg  25 mg Oral TID PRN Anike, Adaku C, NP      . LORazepam (ATIVAN) tablet 2 mg  2 mg Oral Q4H PRN Malvin JohnsFarah, Venie Montesinos, MD       Or  . LORazepam (ATIVAN) injection 2 mg  2 mg Intramuscular Q4H PRN Malvin JohnsFarah, Jensen [place], MD      . omega-3 acid ethyl esters (LOVAZA) capsule 1 g  1 g Oral BID Malvin JohnsFarah, Gerturde Kuba, MD   1 g at 11/28/18 1758  . risperiDONE (RISPERDAL) tablet 3 mg  3 mg Oral BID Malvin JohnsFarah, Allayah Raineri, MD   3 mg at 11/28/18 1758  . temazepam (RESTORIL) capsule 30 mg  30 mg Oral QHS Malvin JohnsFarah, Abdirizak Richison, MD      . traZODone (DESYREL) tablet 50 mg  50 mg Oral QHS PRN Anike, Adaku C, NP        Lab Results:  Results for orders placed or performed during the hospital encounter of 11/27/18 (from the past 48 hour(s))   Hemoglobin A1c     Status: None   Collection Time: 11/28/18  6:50 AM  Result Value Ref Range   Hgb A1c MFr Bld 5.5 4.8 - 5.6 %    Comment: (NOTE) Pre diabetes:          5.7%-6.4% Diabetes:              >6.4% Glycemic control for   <7.0% adults with diabetes    Mean Plasma Glucose 111.15 mg/dL    Comment: Performed at [facility], [address] [place], [place] [address]  Lipid panel     Status: None   Collection Time: 11/28/18  6:50 AM  Result Value Ref Range   Cholesterol 161 0 - 200 mg/dL   Triglycerides 53 <604<150 mg/dL   HDL 55 >54>40 mg/dL   Total CHOL/HDL Ratio 2.9 RATIO   VLDL 11 0 - 40 mg/dL   LDL Cholesterol 95 0 - 99 mg/dL    Comment:        Total Cholesterol/HDL:CHD Risk Coronary Heart Disease Risk Table                     Men   Women  1/2 Average Risk   3.4   3.3  Average Risk       5.0   4.4  2 X Average Risk   9.6   7.1  3 X Average Risk  23.4   11.0        Use the calculated Patient Ratio above and the CHD Risk Table to determine the patient's CHD Risk.        ATP III CLASSIFICATION (LDL):  <100     mg/dL   Optimal  098-119100-129  mg/dL   Near or Above                    Optimal  130-159  mg/dL   Borderline  [id number]  mg/dL   High  >562>190     mg/dL   Very High Performed at [facility], [address] [place], [place] [address]   Prolactin     Status: Abnormal   Collection Time: 11/28/18  6:50 AM  Result Value Ref Range   Prolactin 23.4 (H) 4.0 - 15.2 ng/mL    Comment: (NOTE) Performed At: [facility] [address] [place], [place] [id number] Jolene SchimkeNagendra Sanjai MD BM:[id number]:[phone]   TSH     Status: None   Collection  Time: 11/28/18  6:50 AM  Result Value Ref Range   TSH 1.271 0.350 - 4.500 uIU/mL    Comment: Performed by a 3rd Generation assay with a functional sensitivity of <=0.01 uIU/mL. Performed at [facility], [place] [address], [place], [place] [address]     Blood Alcohol level:  Lab Results   Component Value Date   ETH <10 27/74/1287    Metabolic Disorder Labs: Lab Results  Component Value Date   HGBA1C 5.5 11/28/2018   MPG 111.15 11/28/2018   Lab Results  Component Value Date   PROLACTIN 23.4 (H) 11/28/2018   Lab Results  Component Value Date   CHOL 161 11/28/2018   TRIG 53 11/28/2018   HDL 55 11/28/2018   CHOLHDL 2.9 11/28/2018   VLDL 11 11/28/2018   [place] 95 11/28/2018    Physical Findings: AIMS:  , ,  ,  ,    CIWA:    COWS:     Musculoskeletal: Strength & Muscle Tone: within normal limits Gait & Station: normal Patient leans: N/A  Psychiatric Specialty Exam: Physical Exam  ROS  Blood pressure 124/80, pulse (!) 122, temperature 97.8 F (36.6 C), temperature source Oral.There is no height or weight on file to calculate BMI.  General Appearance: Casual  Eye Contact:  None  Speech:  Slow  Volume:  Decreased  Mood:  Dysphoric  Affect:  Blunt  Thought Process:  Descriptions of Associations: Circumstantial  Orientation:  Full (Time, Place, and Person) presumed but does not answer  Thought Content:  Poverty of content  Suicidal Thoughts:  No  Homicidal Thoughts:  No  Memory:  Immediate;   Poor Recent;   Poor Remote;   Poor  Judgement:  Impaired  Insight:  Lacking  Psychomotor Activity:  Decreased  Concentration:  Concentration: Poor and Attention Span: Poor  Recall:  Poor  Fund of Knowledge:  Poor  Language:  Poor  Akathisia:  Negative  Handed:  Right  AIMS (if indicated):     Assets:  Leisure Time Physical Health Resilience  ADL's:  Intact  Cognition:  WNL  Sleep:  Number of Hours: 10     Treatment Plan Summary: Daily contact with patient to assess and evaluate symptoms and progress in treatment and Medication management  Continue antipsychotic therapy continue to monitor for safety no change in precautions Continue Risperdal for now is sleeping well probable monitoring through the weekend continue to try and reach family  members  Johnn Hai, MD 11/29/2018, 8:22 AM

## 2018-11-29 NOTE — Progress Notes (Signed)
Recreation Therapy Notes  Date: 11.20.20 Time: 1000 Location: 500 Hall Dayroom  Group Topic: Coping Skills  Goal Area(s) Addresses:  Patient will identify positive coping skills. Patient will identify benefits of using positive coping skills post d/c.  Intervention: Worksheet  Activity: Healthy vs. Unhealthy Coping Strategies.  Patients were to identify a problem they are currently facing.  Patients then identified the unhealthy coping strategies and consequences.  Patients also identified healthy coping strategies, expected outcomes and barriers to using healthy coping strategies.  Education: Coping Skills, Discharge Planning.   Education Outcome: Acknowledges understanding/In group clarification offered/Needs additional education.   Clinical Observations/Feedback: Pt did not attend group.     Duell Holdren, LRT/CTRS         Torry Adamczak A 11/29/2018 11:52 AM 

## 2018-11-29 NOTE — Progress Notes (Signed)
Recreation Therapy Notes  INPATIENT RECREATION THERAPY ASSESSMENT  Patient Details Name: Wesley Peterson MRN: [id number] DOB: 02-Mar-1985 Today's Date: 11/29/2018       Information Obtained From: Patient  Able to Participate in Assessment/Interview: Yes  Patient Presentation: Alert  Reason for Admission (Per Patient): Other (Comments)(Pt stated a psychiatric evaluation.)  Patient Stressors: Family  Coping Skills:   Write, Sports, TV, Arguments, Music, Exercise, Meditate, Deep Breathing, Talk, Prayer, Avoidance, Read, Hot Bath/Shower  Leisure Interests (2+):  Individual - Reading, Individual - Computer, Music - Write music  Frequency of Recreation/Participation: Weekly  Awareness of Community Resources:  Yes  Community Resources:  [place]  Current Use: Yes  If no, Barriers?:    Expressed Interest in [place]: No  County of Residence:  [place]  Patient Main Form of Transportation: Musician  Patient Strengths:  Writing; Creativity  Patient Identified Areas of Improvement:  Being more social; balance out sleep between too much and not enough  Patient Goal for Hospitalization:  "come out better than how I was before but the same environment that got me here, I'm going back to"  Current SI (including self-harm):  No  Current HI:  No  Current AVH: No  Staff Intervention Plan: Group Attendance  Consent to Intern Participation: N/A    Victorino Sparrow, LRT/CTRS  Ria Comment, Rheagan Nayak A 11/29/2018, 12:30 PM

## 2018-11-29 NOTE — Progress Notes (Signed)
   11/29/18 2100  Psych Admission Type (Psych Patients Only)  Admission Status Voluntary  Psychosocial Assessment  Patient Complaints None  Eye Contact Fair  Facial Expression Anxious  Affect Anxious  Speech Argumentative  Interaction Cautious  Motor Activity Slow  Appearance/Hygiene Disheveled  Behavior Characteristics Cooperative  Mood Anxious  Aggressive Behavior  Effect No apparent injury  Thought Process  Coherency Disorganized  Content Preoccupation  Delusions WDL  Perception WDL  Hallucination Auditory  Judgment Poor  Confusion WDL  Danger to Self  Current suicidal ideation? Denies  Danger to Others  Danger to Others None reported or observed

## 2018-11-30 DIAGNOSIS — D539 Nutritional anemia, unspecified: Secondary | ICD-10-CM

## 2018-11-30 DIAGNOSIS — F200 Paranoid schizophrenia: Principal | ICD-10-CM

## 2018-11-30 DIAGNOSIS — D709 Neutropenia, unspecified: Secondary | ICD-10-CM

## 2018-11-30 MED ORDER — ACETAMINOPHEN 325 MG PO TABS
650.0000 mg | ORAL_TABLET | Freq: Four times a day (QID) | ORAL | Status: DC | PRN
  Administered 2018-11-30: 650 mg via ORAL
  Filled 2018-11-30: qty 2

## 2018-11-30 MED ORDER — IBUPROFEN 600 MG PO TABS
600.0000 mg | ORAL_TABLET | Freq: Three times a day (TID) | ORAL | Status: DC | PRN
  Administered 2018-12-01 – 2018-12-02 (×3): 600 mg via ORAL
  Filled 2018-11-30 (×3): qty 1

## 2018-11-30 NOTE — [facility] Group Notes (Signed)
[place] Group Notes: (Clinical Social Work)   11/30/2018      Type of Therapy:  Group Therapy   Participation Level:  Group was cancelled due to acuity on the hall   [employer], LCSW 11/30/2018, 4:48 PM

## 2018-11-30 NOTE — Progress Notes (Signed)
   11/30/18 1300  Psych Admission Type (Psych Patients Only)  Admission Status Voluntary  Psychosocial Assessment  Patient Complaints None  Eye Contact Fair  Facial Expression Anxious  Affect Anxious  Speech Argumentative  Interaction Cautious  Motor Activity Slow  Appearance/Hygiene Disheveled  Behavior Characteristics Cooperative  Mood Anxious  Aggressive Behavior  Effect No apparent injury  Thought Process  Coherency Disorganized  Content Preoccupation  Delusions WDL  Perception WDL  Hallucination Auditory  Judgment Poor  Confusion WDL  Danger to Self  Current suicidal ideation? Denies  Danger to Others  Danger to Others None reported or observed

## 2018-11-30 NOTE — Progress Notes (Signed)
[facility] MD Progress Note  11/30/2018 8:46 AM Wesley Peterson  MRN:  [id number] Subjective: Patient is a 33 year old male admitted on 11/28/2018 under involuntary commitment secondary to psychotic symptoms including anger outbursts and mood volatility.  While he was being evaluated in the emergency department it was also noted that he was responding to internal stimuli.  Objective: Patient is seen and examined.  Patient is a 33 year old male with a probable diagnosis of schizophrenia who is seen in follow-up.  He is lying in bed this morning, and easily aroused.  His current medications include Cogentin, hydroxyzine, omega-3 fatty acids, Risperdal, temazepam and trazodone.  Review of the nursing notes show that he attended group last p.m.  He stated that he was feeling better and had accomplished his goal for the day.  He stated his relationship with his family had also improved since he had been admitted.  He rated his overall day at 10.  This morning he denied any auditory or visual hallucinations.  He denied any suicidal or homicidal ideation.  He denied any side effects to his current medications.  His vital signs are stable, he is afebrile.  He slept 8 hours last night.  Review of his admission laboratories revealed an essentially normal metabolic panel, normal lipid panel, a mild anemia and a mild leukopenia.  His MCV was elevated at 100.5.  His blood alcohol on admission was less than 10.  Drug screen was negative.  TSH was normal.  There were no other lab results in the electronic medical record to compare previously.  I am unable to find any evidence of alcohol related issues in the chart.  Principal Problem: <principal problem not specified> Diagnosis: Active Problems:   Psychosis (HCC)   Schizophrenia, paranoid (HCC)  Total Time spent with patient: 20 minutes  Past Psychiatric History: See admission H&P  Past Medical History: History reviewed. No pertinent past medical history. History reviewed.  No pertinent surgical history. Family History: History reviewed. No pertinent family history. Family Psychiatric  History: See admission H&P Social History:  Social History   Substance and Sexual Activity  Alcohol Use Yes  . Alcohol/week: 1.0 standard drinks  . Types: 1 Cans of beer per week     Social History   Substance and Sexual Activity  Drug Use Never    Social History   Socioeconomic History  . Marital status: Single    Spouse name: Not on file  . Number of children: Not on file  . Years of education: Not on file  . Highest education level: Not on file  Occupational History  . Not on file  Social Needs  . Financial resource strain: Not on file  . Food insecurity    Worry: Not on file    Inability: Not on file  . Transportation needs    Medical: Not on file    Non-medical: Not on file  Tobacco Use  . Smoking status: Never Smoker  . Smokeless tobacco: Never Used  Substance and Sexual Activity  . Alcohol use: Yes    Alcohol/week: 1.0 standard drinks    Types: 1 Cans of beer per week  . Drug use: Never  . Sexual activity: Not on file  Lifestyle  . Physical activity    Days per week: Not on file    Minutes per session: Not on file  . Stress: Not on file  Relationships  . Social Musician on phone: Not on file    Gets together: Not  on file    Attends religious service: Not on file    Active member of club or organization: Not on file    Attends meetings of clubs or organizations: Not on file    Relationship status: Not on file  Other Topics Concern  . Not on file  Social History Narrative  . Not on file   Additional Social History:                         Sleep: Good  Appetite:  Fair  Current Medications: Current Facility-Administered Medications  Medication Dose Route Frequency Provider Last Rate Last Dose  . benztropine (COGENTIN) tablet 1 mg  1 mg Oral BID Malvin JohnsFarah, Brian, MD   1 mg at 11/29/18 2049  . hydrOXYzine  (ATARAX/VISTARIL) tablet 25 mg  25 mg Oral TID PRN Anike, Adaku C, NP      . LORazepam (ATIVAN) tablet 2 mg  2 mg Oral Q4H PRN Malvin JohnsFarah, Brian, MD       Or  . LORazepam (ATIVAN) injection 2 mg  2 mg Intramuscular Q4H PRN Malvin JohnsFarah, Brian, MD      . omega-3 acid ethyl esters (LOVAZA) capsule 1 g  1 g Oral BID Malvin JohnsFarah, Brian, MD   1 g at 11/29/18 2048  . risperiDONE (RISPERDAL) tablet 3 mg  3 mg Oral BID Malvin JohnsFarah, Brian, MD   3 mg at 11/29/18 2049  . temazepam (RESTORIL) capsule 30 mg  30 mg Oral QHS Malvin JohnsFarah, Brian, MD   30 mg at 11/29/18 2050  . traZODone (DESYREL) tablet 50 mg  50 mg Oral QHS PRN Anike, Adaku C, NP        Lab Results: No results found for this or any previous visit (from the past 48 hour(s)).  Blood Alcohol level:  Lab Results  Component Value Date   ETH <10 11/25/2018    Metabolic Disorder Labs: Lab Results  Component Value Date   HGBA1C 5.5 11/28/2018   MPG 111.15 11/28/2018   Lab Results  Component Value Date   PROLACTIN 23.4 (H) 11/28/2018   Lab Results  Component Value Date   CHOL 161 11/28/2018   TRIG 53 11/28/2018   HDL 55 11/28/2018   CHOLHDL 2.9 11/28/2018   VLDL 11 11/28/2018   LDLCALC 95 11/28/2018    Physical Findings: AIMS:  , ,  ,  ,    CIWA:    COWS:     Musculoskeletal: Strength & Muscle Tone: within normal limits Gait & Station: normal Patient leans: N/A  Psychiatric Specialty Exam: Physical Exam  Nursing note and vitals reviewed. Constitutional: He is oriented to person, place, and time. He appears well-developed and well-nourished.  HENT:  Head: Normocephalic and atraumatic.  Respiratory: Effort normal.  Neurological: He is alert and oriented to person, place, and time.    ROS  Blood pressure 124/80, pulse (!) 122, temperature 98.2 F (36.8 C), temperature source Oral.There is no height or weight on file to calculate BMI.  General Appearance: Disheveled  Eye Contact:  Fair  Speech:  Normal Rate  Volume:  Decreased  Mood:  Sedated   Affect:  Congruent  Thought Process:  Coherent and Descriptions of Associations: Circumstantial  Orientation:  Full (Time, Place, and Person)  Thought Content:  Negative  Suicidal Thoughts:  No  Homicidal Thoughts:  No  Memory:  Immediate;   Fair Recent;   Fair Remote;   Fair  Judgement:  Intact  Insight:  Fair  Psychomotor Activity:  Decreased  Concentration:  Concentration: Fair and Attention Span: Fair  Recall:  [employer] of Knowledge:  Fair  Language:  Fair  Akathisia:  Negative  Handed:  Right  AIMS (if indicated):     Assets:  Desire for Improvement Resilience  ADL's:  Intact  Cognition:  WNL  Sleep:  Number of Hours: 8     Treatment Plan Summary: Daily contact with patient to assess and evaluate symptoms and progress in treatment, Medication management and Plan : Patient is seen and examined.  Patient is a 33 year old male with the above-stated past psychiatric history who is seen in follow-up.   Diagnosis: #1 schizophrenia, #2 macrocytic anemia  Patient is seen in follow-up over the weekend.  It seems as though his psychiatric situation is stable.  His psychotic symptoms are improved, he is not having any mood lability, and sleep is good.  He denied any side effects to his current medications.  No change in his psychiatric medications today.  I am going to order an anemia panel which will include the Y09 and folic acid.  I will start him on X83 and folic acid today.  We will see what those numbers reveal prior to making a diagnosis.  He is mildly leukopenic, and his absolute neutrophil count is slightly low as well.  I will discuss with him his HIV risk factors, and run that test for clarity in this issue. 1.  Continue Cogentin 1 mg p.o. twice daily for side effects of medications. 2.  Continue hydroxyzine 25 mg p.o. 3 times daily as needed anxiety. 3.  Continue lorazepam 2 mg IM or p.o. every 4 hours as needed agitation. 4.  Continue omega-3 fatty acids 1 g p.o. twice  daily for neuro protection. 5.  Continue Risperdal 3 mg p.o. twice daily for psychosis. 6.  Continue temazepam 30 mg p.o. nightly for insomnia. 7.  Continue trazodone 50 mg p.o. nightly as needed insomnia. 8.  Start folic acid 1 mg p.o. daily for nutritional supplementation. 9.  Start thiamine 100 mg p.o. daily as well as 100 mg IM x1 now for nutritional supplementation. 10.  Order anemia panel as well as an HIV. 11.  Disposition planning-in progress.  Sharma Covert, MD 11/30/2018, 8:46 AM

## 2018-12-01 NOTE — Progress Notes (Signed)
D:  Patient denied SI and HI, contracts for safety.  Denied A/V hallucinations. A:  Emotional support and encouragement given patient. R:  Safety maintained with 15 minute checks.  

## 2018-12-01 NOTE — Progress Notes (Signed)
   11/30/18 2120  Psych Admission Type (Psych Patients Only)  Admission Status Voluntary  Psychosocial Assessment  Patient Complaints None  Eye Contact Fair  Facial Expression Anxious  Affect Anxious  Speech Soft  Interaction Cautious  Motor Activity Slow  Appearance/Hygiene Disheveled  Behavior Characteristics Anxious  Mood Anxious;Preoccupied  Aggressive Behavior  Effect No apparent injury  Thought Process  Coherency Disorganized  Content Preoccupation  Delusions WDL  Perception WDL  Hallucination Auditory  Judgment Poor  Confusion WDL  Danger to Self  Current suicidal ideation? Denies  Danger to Others  Danger to Others None reported or observed

## 2018-12-01 NOTE — Progress Notes (Addendum)
   12/01/18 2125  Psych Admission Type (Psych Patients Only)  Admission Status Voluntary  Psychosocial Assessment  Patient Complaints None  Eye Contact Brief  Facial Expression Anxious  Affect Anxious  Speech Soft  Interaction Cautious  Motor Activity Slow  Appearance/Hygiene Disheveled  Behavior Characteristics Appropriate to situation;Guarded  Mood Anxious;Preoccupied  Aggressive Behavior  Effect No apparent injury  Thought Process  Coherency Disorganized  Content Preoccupation  Delusions WDL  Perception WDL  Hallucination None reported or observed  Judgment Poor  Confusion WDL  Danger to Self  Current suicidal ideation? Denies  Danger to Others  Danger to Others None reported or observed  (Correction added to note) Patient observed responding internal stimuli, laughing out loud and talking to himself while receiving his medication at med window. Pleasant but preoccupied.

## 2018-12-01 NOTE — Progress Notes (Signed)
Adult Psychoeducational Group Note  Date:  12/01/2018 Time:  1:12 AM  Group Topic/Focus:  Wrap-Up Group:   The focus of this group is to help patients review their daily goal of treatment and discuss progress on daily workbooks.  Participation Level:  Active  Participation Quality:  Appropriate  Affect:  Appropriate  Cognitive:  Disorganized and Confused  Insight: Limited  Engagement in Group:  Developing/Improving  Modes of Intervention:  Discussion  Additional Comments: Pt stated his goal for today was to focus on his treatment plan. Pt stated he accomplished his goal today. Pt stated his relationship with his family has improved since he was admitted her. Pt stated been able to contact his mother today help improved his day. Pt stated he felt better about himself today. Pt rated his over all day an 8 out of 10. Pt stated his appetite was pretty good today. Pt stated he slept pretty good last night. Pt stated his goal for tonight was to get some rest. Pt stated he was in physical pain tonight. Pt stated he had pain on the right side of his back. Pt's nurse was made aware of the situation. Pt stated he was not hearing or seeing anything that was not there. Pt stated he had no thoughts of harming himself or others. Pt stated if anything change he would alert staff.    Candy Sledge 12/01/2018, 1:12 AM

## 2018-12-01 NOTE — Progress Notes (Signed)
EKG completed and placed on front of patient's chart.  

## 2018-12-01 NOTE — Progress Notes (Signed)
[facility]  12/01/2018 9:38 AM Wesley EmoryMatt Peterson  MRN:  [id number] Subjective:  Patient is a 33 year old male admitted on 11/28/2018 under involuntary commitment secondary to psychotic symptoms including anger outbursts and mood volatility.  While he was being evaluated in the emergency department it was also noted that he was responding to internal stimuli.  Objective: Patient is seen and examined.  Patient is a 33 year old male with a past psychiatric history significant for probable schizophrenia who is seen in follow-up.  Review of the nursing notes from yesterday revealed that he did attend group last night.  He had rated his day and 8 out of 10.  His appetite had increased.  He also admitted to good sleep last night.  His vital signs are stable, he is afebrile.  He was mildly tachycardic with a rate of 112.  Nursing notes reflect he slept 5 hours last night.  He remains in his room.  I have only seen him laying in bed the 2 times I visited with him.  He denied any auditory or visual hallucinations.  He denied any gross suicidal ideation.  He denied any suicidal or homicidal ideation.  He has not shown any volatility during the course of the hospitalization.  He remains on Cogentin, omega-3 fatty acids, Risperdal, temazepam and trazodone.  An anemia panel and HIV were ordered because of abnormal laboratories.  It does not appear that they have been drawn yet.  These will be reordered.  Additionally he had an EKG ordered yesterday, and that still has not been done.  There is a good chance he probably refused these.  Principal Problem: <principal problem not specified> Diagnosis: Active Problems:   Psychosis (HCC)   Schizophrenia, paranoid (HCC)  Total Time spent with patient: 20 minutes  Past Psychiatric History: See admission H&P  Past Medical History: History reviewed. No pertinent past medical history. History reviewed. No pertinent surgical history. Family History: History reviewed. No  pertinent family history. Family Psychiatric  History: See admission H&P Social History:  Social History   Substance and Sexual Activity  Alcohol Use Yes  . Alcohol/week: 1.0 standard drinks  . Types: 1 Cans of beer per week     Social History   Substance and Sexual Activity  Drug Use Never    Social History   Socioeconomic History  . Marital status: Single    Spouse name: Not on file  . Number of children: Not on file  . Years of education: Not on file  . Highest education level: Not on file  Occupational History  . Not on file  Social Needs  . Financial resource strain: Not on file  . Food insecurity    Worry: Not on file    Inability: Not on file  . Transportation needs    Medical: Not on file    Non-medical: Not on file  Tobacco Use  . Smoking status: Never Smoker  . Smokeless tobacco: Never Used  Substance and Sexual Activity  . Alcohol use: Yes    Alcohol/week: 1.0 standard drinks    Types: 1 Cans of beer per week  . Drug use: Never  . Sexual activity: Not on file  Lifestyle  . Physical activity    Days per week: Not on file    Minutes per session: Not on file  . Stress: Not on file  Relationships  . Social Musicianconnections    Talks on phone: Not on file    Gets together: Not on file  Attends religious service: Not on file    Active member of club or organization: Not on file    Attends meetings of clubs or organizations: Not on file    Relationship status: Not on file  Other Topics Concern  . Not on file  Social History Narrative  . Not on file   Additional Social History:                         Sleep: Fair  Appetite:  Fair  Current Medications: Current Facility-Administered Medications  Medication Dose Route Frequency Provider Last Rate Last Dose  . acetaminophen (TYLENOL) tablet 650 mg  650 mg Oral Q6H PRN Sharma Covert, MD   650 mg at 11/30/18 1703  . benztropine (COGENTIN) tablet 1 mg  1 mg Oral BID Johnn Hai, MD   1 mg  at 12/01/18 0900  . hydrOXYzine (ATARAX/VISTARIL) tablet 25 mg  25 mg Oral TID PRN Anike, Adaku C, NP      . ibuprofen (ADVIL) tablet 600 mg  600 mg Oral Q8H PRN Sharma Covert, MD      . LORazepam (ATIVAN) tablet 2 mg  2 mg Oral Q4H PRN Johnn Hai, MD       Or  . LORazepam (ATIVAN) injection 2 mg  2 mg Intramuscular Q4H PRN Johnn Hai, MD      . omega-3 acid ethyl esters (LOVAZA) capsule 1 g  1 g Oral BID Johnn Hai, MD   1 g at 12/01/18 0900  . risperiDONE (RISPERDAL) tablet 3 mg  3 mg Oral BID Johnn Hai, MD   3 mg at 12/01/18 0900  . temazepam (RESTORIL) capsule 30 mg  30 mg Oral QHS Johnn Hai, MD   30 mg at 11/30/18 2116  . traZODone (DESYREL) tablet 50 mg  50 mg Oral QHS PRN Anike, Adaku C, NP        Lab Results: No results found for this or any previous visit (from the past 48 hour(s)).  Blood Alcohol level:  Lab Results  Component Value Date   ETH <10 32/20/2542    Metabolic Disorder Labs: Lab Results  Component Value Date   HGBA1C 5.5 11/28/2018   MPG 111.15 11/28/2018   Lab Results  Component Value Date   PROLACTIN 23.4 (H) 11/28/2018   Lab Results  Component Value Date   CHOL 161 11/28/2018   TRIG 53 11/28/2018   HDL 55 11/28/2018   CHOLHDL 2.9 11/28/2018   VLDL 11 11/28/2018   [place] 95 11/28/2018    Physical Findings: AIMS:  , ,  ,  ,    CIWA:    COWS:     Musculoskeletal: Strength & Muscle Tone: within normal limits Gait & Station: normal Patient leans: N/A  Psychiatric Specialty Exam: Physical Exam  Nursing note and vitals reviewed. Constitutional: He is oriented to person, place, and time. He appears well-developed and well-nourished.  HENT:  Head: Normocephalic and atraumatic.  Respiratory: Effort normal.  Neurological: He is alert and oriented to person, place, and time.    ROS  Blood pressure 125/89, pulse (!) 112, temperature 98.8 F (37.1 C), temperature source Oral, resp. rate 18.There is no height or weight on file to  calculate BMI.  General Appearance: Disheveled  Eye Contact:  Fair  Speech:  Normal Rate  Volume:  Decreased  Mood:  Dysphoric  Affect:  Congruent  Thought Process:  Coherent and Descriptions of Associations: Circumstantial  Orientation:  Full (  Time, Place, and Person)  Thought Content:  Logical  Suicidal Thoughts:  No  Homicidal Thoughts:  No  Memory:  Immediate;   Fair Recent;   Fair Remote;   Fair  Judgement:  Intact  Insight:  Fair  Psychomotor Activity:  Decreased  Concentration:  Concentration: Fair and Attention Span: Fair  Recall:  [employer] of Knowledge:  Fair  Language:  Fair  Akathisia:  Negative  Handed:  Right  AIMS (if indicated):     Assets:  Desire for Improvement Resilience  ADL's:  Intact  Cognition:  WNL  Sleep:  Number of Hours: 5     Treatment Plan Summary: Daily contact with patient to assess and evaluate symptoms and progress in treatment, Medication management and Plan : Patient is seen and examined.  Patient is a 33 year old male with the above-stated past psychiatric history who is seen in follow-up.   Diagnosis: #1 schizophrenia, #2 macrocytic anemia, #3 leukopenia  Patient is seen in follow-up.  He looks essentially unchanged.  He denied all psychotic symptoms currently.  There is no evidence of any mood lability at this point.  He did attend group last night, but he spends a great deal of his time in his room and in his bed.  Nursing notes reflect that he slept 5 hours last night, so I will increase his trazodone 200 mg p.o. nightly as needed.  No other changes in his medications.  I have reordered his anemia panel, EKG as well as his HIV.   1.  Continue Cogentin 1 mg p.o. twice daily for side effects of medications. 2.  Continue hydroxyzine 25 mg p.o. 3 times daily as needed anxiety. 3.  Continue lorazepam 2 mg IM or p.o. every 4 hours as needed agitation. 4.  Continue omega-3 fatty acids 1 g p.o. twice daily for neuro protection. 5.   Continue Risperdal 3 mg p.o. twice daily for psychosis. 6.  Continue temazepam 30 mg p.o. nightly for insomnia. 7.  Continue trazodone 50 mg p.o. nightly as needed insomnia. 8.  Continue folic acid 1 mg p.o. daily for nutritional supplementation. 9.   Continue thiamine 100 mg p.o. daily as well as 100 mg IM x1 now for nutritional supplementation. 10.    Reorder anemia panel, EKG as well as an HIV. 11.  Disposition planning-in progress. Antonieta Pert, MD 12/01/2018, 9:38 AM

## 2018-12-01 NOTE — [facility] Group Notes (Signed)
[facility] LCSW Group Therapy Note  Date/Time:  12/01/2018  11:00AM-12:00PM  Type of Therapy and Topic:  Group Therapy:  Music and Mood  Participation Level:  Active   Description of Group: In this process group, members listened to a variety of genres of music and identified that different types of music evoke different responses.  Patients were encouraged to identify music that was soothing for them and music that was energizing for them.  Patients discussed how this knowledge can help with wellness and recovery in various ways including managing depression and anxiety as well as encouraging healthy sleep habits.    Therapeutic Goals: 1. Patients will explore the impact of different varieties of music on mood 2. Patients will verbalize the thoughts they have when listening to different types of music 3. Patients will identify music that is soothing to them as well as music that is energizing to them 4. Patients will discuss how to use this knowledge to assist in maintaining wellness and recovery 5. Patients will explore the use of music as a coping skill  Summary of Patient Progress:  At the beginning of group, patient expressed that he felt good.  At the end of group, patient expressed that he felt better.    Therapeutic Modalities: Solution Focused Brief Therapy Activity   Selmer Dominion, LCSW

## 2018-12-02 MED ORDER — RISPERIDONE 3 MG PO TABS
3.0000 mg | ORAL_TABLET | Freq: Every day | ORAL | Status: DC
  Administered 2018-12-02: 3 mg via ORAL
  Filled 2018-12-02 (×2): qty 1
  Filled 2018-12-02: qty 7
  Filled 2018-12-02: qty 1
  Filled 2018-12-02: qty 7

## 2018-12-02 MED ORDER — PALIPERIDONE PALMITATE ER 234 MG/1.5ML IM SUSY
234.0000 mg | PREFILLED_SYRINGE | Freq: Once | INTRAMUSCULAR | Status: DC
  Filled 2018-12-02: qty 1.5

## 2018-12-02 MED ORDER — RISPERIDONE 3 MG PO TABS
5.0000 mg | ORAL_TABLET | Freq: Every day | ORAL | Status: DC
  Administered 2018-12-02: 5 mg via ORAL
  Filled 2018-12-02 (×3): qty 1

## 2018-12-02 NOTE — Plan of Care (Signed)
  Problem: Education: Goal: Mental status will improve Outcome: Progressing   Problem: Activity: Goal: Interest or engagement in activities will improve Outcome: Progressing   Problem: Coping: Goal: Ability to demonstrate self-control will improve Outcome: Progressing   Problem: Safety: Goal: Periods of time without injury will increase Outcome: Progressing

## 2018-12-02 NOTE — [facility] Group Notes (Signed)
[facility] LCSW Group Therapy Note  Date/Time: 12/02/2018 @ 1:30pm  Type of Therapy/Topic:  Group Therapy:  Feelings about Diagnosis  Participation Level:  Did Not Attend   Mood: Did not attend    Description of Group:    This group will allow patients to explore their thoughts and feelings about diagnoses they have received. Patients will be guided to explore their level of understanding and acceptance of these diagnoses. Facilitator will encourage patients to process their thoughts and feelings about the reactions of others to their diagnosis, and will guide patients in identifying ways to discuss their diagnosis with significant others in their lives. This group will be process-oriented, with patients participating in exploration of their own experiences as well as giving and receiving support and challenge from other group members.   Therapeutic Goals: 1. Patient will demonstrate understanding of diagnosis as evidence by identifying two or more symptoms of the disorder:  2. Patient will be able to express two feelings regarding the diagnosis 3. Patient will demonstrate ability to communicate their needs through discussion and/or role plays  Summary of Patient Progress:    Patient did not attend group today.    Therapeutic Modalities:   Cognitive Behavioral Therapy Brief Therapy Feelings Identification   Guliana Weyandt, LCSW 

## 2018-12-02 NOTE — Progress Notes (Signed)
Pt tried to cheek his medication this evening .

## 2018-12-02 NOTE — Progress Notes (Addendum)
Recreation Therapy Notes  Date:  11.23.20 Time: 1000 Location: [address] Dayroom  Group Topic: Stress Management  Goal Area(s) Addresses:  Patient will identify positive stress management techniques. Patient will identify benefits of using stress management post d/c.  Intervention: Worksheet, pencils  Activity :  Stress Exploration. Patients were to identify factors that contribute to stress in the categories of daily hassles, major life changes and life circumstances.  Patients would then identify factors that protect against stress in the categories of daily uplifts, healthy coping strategies and protective factors.  Education: Stress Management, Discharge Planning.   Education Outcome: Acknowledges Education  Clinical Observations/Feedback: Pt did not attend group.    Victorino Sparrow, LRT/CTRS         Victorino Sparrow A 12/02/2018 11:58 AM

## 2018-12-02 NOTE — Progress Notes (Signed)
   12/02/18 2200  Psych Admission Type (Psych Patients Only)  Admission Status Voluntary  Psychosocial Assessment  Patient Complaints Worrying  Eye Contact Brief  Facial Expression Anxious  Affect Anxious  Speech Soft;Logical/coherent  Interaction Minimal  Motor Activity Slow (WNL)  Appearance/Hygiene Unremarkable  Behavior Characteristics Cooperative  Mood Anxious  Aggressive Behavior  Effect No apparent injury  Thought Process  Coherency Disorganized  Content Preoccupation  Delusions WDL  Perception Hallucinations  Hallucination Auditory  Judgment Poor  Confusion Mild  Danger to Self  Current suicidal ideation? Denies  Danger to Others  Danger to Others None reported or observed
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
## 2018-12-02 NOTE — Progress Notes (Signed)
Patient has been in the shower for at least an hour when mht doing checks. He would answer when his name was called. He then remained in the bathroom after finishing his shower. Mht reported that he was standing in front of the mirror looking at himself and laughing. Writer offered him ativan and trazodone and he decided to take ativan but declined the trazodone. Will continue to monitor patients behavior.

## 2018-12-02 NOTE — Progress Notes (Signed)
Patient ID: Damarious Coster, male   DOB: 10/18/1985, 33 y.o.   MRN: [id number]  [place] NOVEL CORONAVIRUS (COVID-19) DAILY CHECK-OFF SYMPTOMS - answer yes or no to each - every day NO YES  Have you had a fever in the past 24 hours?  . Fever (Temp > 37.80C / 100F) X   Have you had any of these symptoms in the past 24 hours? . New Cough .  Sore Throat  .  Shortness of Breath .  Difficulty Breathing .  Unexplained Body Aches   X   Have you had any one of these symptoms in the past 24 hours not related to allergies?   . Runny Nose .  Nasal Congestion .  Sneezing   X   If you have had runny nose, nasal congestion, sneezing in the past 24 hours, has it worsened?  X   EXPOSURES - check yes or no X   Have you traveled outside the state in the past 14 days?  X   Have you been in contact with someone with a confirmed diagnosis of COVID-19 or PUI in the past 14 days without wearing appropriate PPE?  X   Have you been living in the same home as a person with confirmed diagnosis of COVID-19 or a PUI (household contact)?    X   Have you been diagnosed with COVID-19?    X              What to do next: Answered NO to all: Answered YES to anything:   Proceed with unit schedule Follow the [facility] Inpatient Flowsheet.   
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
## 2018-12-03 MED ORDER — RISPERIDONE 3 MG PO TABS: 6.0000 mg | ORAL_TABLET | Freq: Every day | ORAL | Status: DC

## 2018-12-03 MED ORDER — BENZTROPINE MESYLATE 1 MG PO TABS: 1.0000 mg | ORAL_TABLET | Freq: Two times a day (BID) | ORAL | 2 refills | Status: DC

## 2018-12-03 MED ORDER — RISPERIDONE 3 MG PO TABS: ORAL_TABLET | ORAL | 1 refills | Status: DC

## 2018-12-03 MED ORDER — TEMAZEPAM 30 MG PO CAPS: 30.0000 mg | ORAL_CAPSULE | Freq: Every evening | ORAL | 1 refills | Status: DC | PRN

## 2018-12-03 MED ORDER — OMEGA-3-ACID ETHYL ESTERS 1 G PO CAPS: 1.0000 g | ORAL_CAPSULE | Freq: Two times a day (BID) | ORAL | 2 refills | Status: DC

## 2018-12-03 NOTE — Progress Notes (Signed)
  [facility] Adult Case Management Discharge Plan :  Will you be returning to the same living situation after discharge:  Yes,  home At discharge, do you have transportation home?: Yes,  Patient will be taking a kaizen lyft to [facility] and then will be picked up from there Do you have the ability to pay for your medications: No.;; [facility]  Release of information consent forms completed and in the chart;  Patient's signature needed at discharge.  Patient to Follow up at: Follow-up Information    [facility] Follow up.   Why: Please follow up with [facility] on Monday, November 30th at [place] information: [address] [place] [address] [phone]           Next level of care provider has access to [place] and Suicide Prevention discussed: No.; Patient declined SPE; SPE with pt     Has patient been referred to the Quitline?: Patient refused referral  Patient has been referred for addiction treatment: Yes  Trecia Rogers, LCSW 12/03/2018, 9:47 AM

## 2018-12-03 NOTE — Progress Notes (Signed)
Recreation Therapy Notes  Date: 11.24.20 Time: 0955 Location: [address] Dayroom  Group Topic: Communication, Team Building, Problem Solving  Goal Area(s) Addresses:  Patient will effectively work with peer towards shared goal.  Patient will identify skill used to make activity successful.  Patient will identify how skills used during activity can be used to reach post d/c goals.   Intervention: STEM Activity   Activity: In team's, using 10 red plastic cups, patients were asked to construct a pyramid using a rubber band with strings attached to it.  Education: Education officer, community, Dentist.   Education Outcome: Acknowledges education/In group clarification offered/Needs additional education.   Clinical Observations/Feedback:  Pt did not attend group.    Victorino Sparrow, LRT/CTRS    Victorino Sparrow A 12/03/2018 10:42 AM

## 2018-12-03 NOTE — Progress Notes (Signed)
CSW scheduled kaizen lyft for 11:30am. Pt's nurse was notified.

## 2018-12-03 NOTE — [facility] Suicide Risk Assessment (Signed)
[facility] Discharge Suicide Risk Assessment   Principal Problem: Schizophrenia/chronically untreated Discharge Diagnoses: Active Problems:   Psychosis ([place])   Schizophrenia, paranoid ([place])   Total Time spent with patient: 45 minutes Musculoskeletal: Strength & Muscle Tone: within normal limits Gait & Station: normal Patient leans: N/A  Psychiatric Specialty Exam: Physical Exam  ROS  Blood pressure 131/90, pulse (!) 102, temperature 97.6 F (36.4 C), temperature source Oral, resp. rate 18.There is no height or weight on file to calculate BMI.  General Appearance: Casual  Eye Contact:  Good  Speech:  Clear and Coherent  Volume:  Normal  Mood:  Euthymic  Affect:  Appropriate  Thought Process:  Coherent  Orientation:  Full (Time, Place, and Person)  Thought Content:  Logical  Suicidal Thoughts:  No  Homicidal Thoughts:  No  Memory:  Immediate;   Good Recent;   Good Remote;   Good  Judgement:  Good  Insight:  Good  Psychomotor Activity:  Normal  Concentration:  Concentration: Good and Attention Span: Good  Recall:  Good  Fund of Knowledge:  Good  Language:  Good  Akathisia:  Negative  Handed:  Right  AIMS (if indicated):     Assets:  Resilience Social Support  ADL's:  Intact  Cognition:  WNL  Sleep:  Number of Hours: 5.5    Demographic Factors:  Male  Loss Factors: Decrease in vocational status  Historical Factors: NA  Risk Reduction Factors:   Sense of responsibility to family and Religious beliefs about death  Continued Clinical Symptoms:  Previous Psychiatric Diagnoses and Treatments  Cognitive Features That Contribute To Risk:  Loss of executive function    Suicide Risk:  Minimal: No identifiable suicidal ideation.  Patients presenting with no risk factors but with morbid ruminations; may be classified as minimal risk based on the severity of the depressive symptoms  Follow-up Information    [facility] Follow up.   Why: Please follow  up with [facility] on Monday, November 30th at [place] information: [place] [address] [phone]           Plan Of Care/Follow-up recommendations:  Activity:  full  Verbena Boeding, MD 12/03/2018, 7:56 AM

## 2018-12-03 NOTE — Progress Notes (Signed)
Patient ID: Wesley Peterson, male   DOB: 1985-08-23, 33 y.o.   MRN: [id number] Patient discharged to home/self are on his own accord pa
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
## 2018-12-03 NOTE — Plan of Care (Signed)
Pt did not attend recreation therapy group sessions.   Wesley Peterson, LRT/CTRS 

## 2018-12-03 NOTE — Progress Notes (Signed)
Recreation Therapy Notes  INPATIENT RECREATION TR PLAN  Patient Details Name: Wesley Peterson MRN: [id number] DOB: 1985/08/29 Today's Date: 12/03/2018  Rec Therapy Plan Is patient appropriate for Therapeutic Recreation?: Yes Treatment times per week: about 3 days Estimated Length of Stay: 5-7 days TR Treatment/Interventions: Group participation (Comment)  Discharge Criteria Pt will be discharged from therapy if:: Discharged Treatment plan/goals/alternatives discussed and agreed upon by:: Patient/family  Discharge Summary Short term goals set: See patient care plan Short term goals met: Not met Reason goals not met: Pt did not attend group sessions Therapeutic equipment acquired: N/A Reason patient discharged from therapy: Discharge from hospital Pt/family agrees with progress & goals achieved: Yes Date patient discharged from therapy: 12/03/18    Victorino Sparrow, LRT/CTRS  Ria Comment, Britiny Defrain A 12/03/2018, 10:54 AM

## 2018-12-04 ENCOUNTER — Encounter (HOSPITAL_COMMUNITY): Payer: Self-pay | Admitting: Behavioral Health

## 2018-12-04 ENCOUNTER — Other Ambulatory Visit: Payer: Self-pay

## 2018-12-04 ENCOUNTER — Inpatient Hospital Stay (HOSPITAL_COMMUNITY)
Admission: AD | Admit: 2018-12-04 | Discharge: 2018-12-13 | DRG: 885 | Disposition: A | Discharge status: Home / Self Care | Payer: Federal, State, Local not specified - Other | Source: Intra-hospital | Attending: Psychiatry | Admitting: Psychiatry

## 2018-12-04 ENCOUNTER — Encounter (HOSPITAL_COMMUNITY): Payer: Self-pay

## 2018-12-04 ENCOUNTER — Ambulatory Visit (HOSPITAL_COMMUNITY)
Admission: RE | Admit: 2018-12-04 | Discharge: 2018-12-04 | Disposition: A | Discharge status: Home / Self Care | Payer: No Typology Code available for payment source | Attending: Psychiatry | Admitting: Psychiatry

## 2018-12-04 DIAGNOSIS — F209 Schizophrenia, unspecified: Secondary | ICD-10-CM | POA: Diagnosis present

## 2018-12-04 DIAGNOSIS — R4182 Altered mental status, unspecified: Secondary | ICD-10-CM | POA: Diagnosis present

## 2018-12-04 DIAGNOSIS — D539 Nutritional anemia, unspecified: Secondary | ICD-10-CM | POA: Diagnosis present

## 2018-12-04 DIAGNOSIS — F419 Anxiety disorder, unspecified: Secondary | ICD-10-CM | POA: Diagnosis not present

## 2018-12-04 DIAGNOSIS — G4700 Insomnia, unspecified: Secondary | ICD-10-CM | POA: Diagnosis present

## 2018-12-04 DIAGNOSIS — Z9114 Patient's other noncompliance with medication regimen: Secondary | ICD-10-CM

## 2018-12-04 DIAGNOSIS — Z20828 Contact with and (suspected) exposure to other viral communicable diseases: Secondary | ICD-10-CM | POA: Diagnosis present

## 2018-12-04 DIAGNOSIS — R450 Nervousness: Secondary | ICD-10-CM | POA: Insufficient documentation

## 2018-12-04 DIAGNOSIS — F201 Disorganized schizophrenia: Secondary | ICD-10-CM | POA: Diagnosis not present

## 2018-12-04 HISTORY — DX: Schizophrenia, unspecified: F20.9

## 2018-12-04 LAB — SARS CORONAVIRUS 2 BY RT PCR (HOSPITAL ORDER, PERFORMED IN ~~LOC~~ HOSPITAL LAB): SARS Coronavirus 2: NEGATIVE

## 2018-12-04 MED ORDER — RISPERIDONE 2 MG PO TBDP
3.0000 mg | ORAL_TABLET | Freq: Every day | ORAL | Status: DC
  Filled 2018-12-04: qty 1

## 2018-12-04 MED ORDER — RISPERIDONE 2 MG PO TBDP
3.0000 mg | ORAL_TABLET | Freq: Two times a day (BID) | ORAL | Status: DC
  Administered 2018-12-05 – 2018-12-12 (×15): 3 mg via ORAL
  Filled 2018-12-04 (×23): qty 1

## 2018-12-04 MED ORDER — MAGNESIUM HYDROXIDE 400 MG/5ML PO SUSP: 30.0000 mL | Freq: Every day | ORAL | Status: DC | PRN

## 2018-12-04 MED ORDER — LORAZEPAM 1 MG PO TABS
1.0000 mg | ORAL_TABLET | ORAL | Status: AC | PRN
  Administered 2018-12-07: 1 mg via ORAL
  Filled 2018-12-04: qty 1

## 2018-12-04 MED ORDER — BENZTROPINE MESYLATE 1 MG PO TABS
1.0000 mg | ORAL_TABLET | Freq: Two times a day (BID) | ORAL | Status: DC
  Administered 2018-12-05 – 2018-12-12 (×15): 1 mg via ORAL
  Filled 2018-12-04: qty 14
  Filled 2018-12-04 (×8): qty 1
  Filled 2018-12-04: qty 14
  Filled 2018-12-04 (×3): qty 1
  Filled 2018-12-04: qty 14
  Filled 2018-12-04 (×7): qty 1
  Filled 2018-12-04: qty 14
  Filled 2018-12-04 (×3): qty 1

## 2018-12-04 MED ORDER — TRAZODONE HCL 50 MG PO TABS: 50.0000 mg | ORAL_TABLET | Freq: Every evening | ORAL | Status: DC | PRN

## 2018-12-04 MED ORDER — ACETAMINOPHEN 325 MG PO TABS
650.0000 mg | ORAL_TABLET | Freq: Four times a day (QID) | ORAL | Status: DC | PRN
  Administered 2018-12-05 – 2018-12-10 (×7): 650 mg via ORAL
  Filled 2018-12-04 (×7): qty 2

## 2018-12-04 MED ORDER — ALUM & MAG HYDROXIDE-SIMETH 200-200-20 MG/5ML PO SUSP: 30.0000 mL | ORAL | Status: DC | PRN

## 2018-12-04 MED ORDER — OMEGA-3-ACID ETHYL ESTERS 1 G PO CAPS
1.0000 g | ORAL_CAPSULE | Freq: Two times a day (BID) | ORAL | Status: DC
  Administered 2018-12-05 – 2018-12-12 (×15): 1 g via ORAL
  Filled 2018-12-04 (×23): qty 1

## 2018-12-04 MED ORDER — HYDROXYZINE HCL 25 MG PO TABS: 25.0000 mg | ORAL_TABLET | Freq: Three times a day (TID) | ORAL | Status: DC | PRN

## 2018-12-04 MED ORDER — OLANZAPINE 5 MG PO TBDP
5.0000 mg | ORAL_TABLET | Freq: Three times a day (TID) | ORAL | Status: DC | PRN
  Filled 2018-12-04: qty 1

## 2018-12-04 MED ORDER — RISPERIDONE 2 MG PO TBDP
6.0000 mg | ORAL_TABLET | Freq: Every day | ORAL | Status: DC
  Filled 2018-12-04: qty 3

## 2018-12-04 MED ORDER — ZIPRASIDONE MESYLATE 20 MG IM SOLR
20.0000 mg | INTRAMUSCULAR | Status: AC | PRN
  Administered 2018-12-04: 20 mg via INTRAMUSCULAR
  Filled 2018-12-04: qty 20

## 2018-12-04 NOTE — Progress Notes (Signed)
Patient ID: Wesley Peterson, male   DOB: [birth year], 33 y.o.   MRN: [id number] Pt will be admitted to Adult Unit. Skin assessment and search completed. No contraband noted. Old Scar noted to Left Shoulder and back of both legs. Pt alert, calm and cooperative with assessment. No apparent distress noted or complaints voiced. Will continue to monitor q 15 mins for safety.
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
## 2018-12-04 NOTE — Tx Team (Signed)
Initial Treatment Plan 12/04/2018 9:43 PM [name] RUE:[id number]    PATIENT STRESSORS: Financial difficulties Marital or family conflict Medication change or noncompliance   PATIENT STRENGTHS: General fund of knowledge Supportive family/friends   PATIENT IDENTIFIED PROBLEMS:  psychosis  anger  "anger problems"                 DISCHARGE CRITERIA:  Improved stabilization in mood, thinking, and/or behavior Verbal commitment to aftercare and medication compliance  PRELIMINARY DISCHARGE PLAN: Attend aftercare/continuing care group Attend 12-step recovery group Outpatient therapy  PATIENT/FAMILY INVOLVEMENT: This treatment plan has been presented to and reviewed with the patient, Wesley Peterson.  The patient and family have been given the opportunity to ask questions and make suggestions.  Providence Crosby, RN 12/04/2018, 9:43 PM
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
## 2018-12-04 NOTE — Progress Notes (Signed)
Patient ID: Wesley Peterson, male   DOB: 08-Aug-1985, 33 y.o.   MRN: [id number] Admission Note:  D:33 yr male who presents IVC in no acute distress for the treatment of aggression and psychosis. Pt appears flat and depressed. Pt was calm and cooperative with admission process. Pt denies SI/ HI /AVH at this time. Pt was D/C from [facility] 11/24 . Pt back after having aggressive behavior with family members. Pt appears to not be taking his medications at home. Pt was brought over from observation unit and they did his skin / belongings search and placed his belongings in locker 64. Pt asked why he was back pt replied" anger problem I guess"   A: POC and unit policies explained and understanding verbalized. Consents obtained. Food and fluids offered, and fluids accepted.  R: Pt had no additional questions or concerns.

## 2018-12-05 DIAGNOSIS — F209 Schizophrenia, unspecified: Principal | ICD-10-CM

## 2018-12-05 MED ORDER — TRAZODONE HCL 50 MG PO TABS
50.0000 mg | ORAL_TABLET | Freq: Every evening | ORAL | Status: DC | PRN
  Administered 2018-12-06 – 2018-12-12 (×5): 50 mg via ORAL
  Filled 2018-12-05 (×8): qty 1
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
## 2018-12-05 NOTE — Progress Notes (Signed)
Patient ID: Wesley Peterson, male   DOB: [birth year], 33 y.o.   MRN: [id number] D: Patient in bed, observed to be calm and cooperative, denies being in any distress, denies SI/HI/AVH.  A: Pt is being monitored on Q15 minute safety checks, all meds given as ordered, pt given Trazodone 50mg  for insomnia.  R: Will continue to monitor on Q15 minute checks

## 2018-12-06 LAB — CBC WITH DIFFERENTIAL/PLATELET
Abs Immature Granulocytes: 0.01 10*3/uL (ref 0.00–0.07)
Basophils Absolute: 0.1 10*3/uL (ref 0.0–0.1)
Basophils Relative: 1 %
Eosinophils Absolute: 0.1 10*3/uL (ref 0.0–0.5)
Eosinophils Relative: 2 %
HCT: 41 % (ref 39.0–52.0)
Hemoglobin: 12.9 g/dL — ABNORMAL LOW (ref 13.0–17.0)
Immature Granulocytes: 0 %
Lymphocytes Relative: 40 %
Lymphs Abs: 1.7 10*3/uL (ref 0.7–4.0)
MCH: 31.9 pg (ref 26.0–34.0)
MCHC: 31.5 g/dL (ref 30.0–36.0)
MCV: 101.2 fL — ABNORMAL HIGH (ref 80.0–100.0)
Monocytes Absolute: 0.5 10*3/uL (ref 0.1–1.0)
Monocytes Relative: 10 %
Neutro Abs: 2.1 10*3/uL (ref 1.7–7.7)
Neutrophils Relative %: 47 %
Platelets: 235 10*3/uL (ref 150–400)
RBC: 4.05 MIL/uL — ABNORMAL LOW (ref 4.22–5.81)
RDW: 13.9 % (ref 11.5–15.5)
WBC: 4.4 10*3/uL (ref 4.0–10.5)
nRBC: 0 % (ref 0.0–0.2)

## 2018-12-06 LAB — BASIC METABOLIC PANEL
Anion gap: 7 (ref 5–15)
BUN: 14 mg/dL (ref 6–20)
CO2: 25 mmol/L (ref 22–32)
Calcium: 9.4 mg/dL (ref 8.9–10.3)
Chloride: 108 mmol/L (ref 98–111)
Creatinine, Ser: 0.87 mg/dL (ref 0.61–1.24)
GFR calc Af Amer: >60 mL/min (ref >60)
GFR calc non Af Amer: >60 mL/min (ref >60)
Glucose, Bld: 93 mg/dL (ref 70–99)
Potassium: 4.1 mmol/L (ref 3.5–5.1)
Sodium: 140 mmol/L (ref 135–145)
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
## 2018-12-06 NOTE — Tx Team (Signed)
Interdisciplinary Treatment and Diagnostic Plan Update  12/06/2018 Time of Session: 10:00am Wesley Peterson MRN: [id number]  Principal Diagnosis: Schizophrenia [facility])  Secondary Diagnoses: Principal Problem:   Schizophrenia (HCC)   Current Medications:  Current Facility-Administered Medications  Medication Dose Route Frequency Provider Last Rate Last Dose  . acetaminophen (TYLENOL) tablet 650 mg  650 mg Oral Q6H PRN Aldean Baker, NP   650 mg at 12/05/18 0907  . alum & mag hydroxide-simeth (MAALOX/MYLANTA) 200-200-20 MG/5ML suspension 30 mL  30 mL Oral Q4H PRN Aldean Baker, NP      . benztropine (COGENTIN) tablet 1 mg  1 mg Oral BID Aldean Baker, NP   1 mg at 12/06/18 0858  . OLANZapine zydis (ZYPREXA) disintegrating tablet 5 mg  5 mg Oral Q8H PRN Aldean Baker, NP       And  . LORazepam (ATIVAN) tablet 1 mg  1 mg Oral PRN Aldean Baker, NP      . magnesium hydroxide (MILK OF MAGNESIA) suspension 30 mL  30 mL Oral Daily PRN Aldean Baker, NP      . omega-3 acid ethyl esters (LOVAZA) capsule 1 g  1 g Oral BID Aldean Baker, NP   1 g at 12/06/18 0859  . risperiDONE (RISPERDAL M-TABS) disintegrating tablet 3 mg  3 mg Oral BID Cobos, Rockey Situ, MD   3 mg at 12/06/18 0859  . traZODone (DESYREL) tablet 50 mg  50 mg Oral QHS PRN Cobos, Rockey Situ, MD       PTA Medications: Medications Prior to Admission  Medication Sig Dispense Refill Last Dose  . benztropine (COGENTIN) 1 MG tablet Take 1 tablet (1 mg total) by mouth 2 (two) times daily. (Patient not taking: Reported on 12/05/2018) 60 tablet 2 Not Taking at Unknown time  . omega-3 acid ethyl esters (LOVAZA) 1 g capsule Take 1 capsule (1 g total) by mouth 2 (two) times daily. (Patient not taking: Reported on 12/05/2018) 60 capsule 2 Not Taking at Unknown time  . risperiDONE (RISPERDAL) 3 MG tablet 1 IN AM 2 AT HS (Patient not taking: Reported on 12/05/2018) 90 tablet 1 Not Taking at Unknown time  . temazepam (RESTORIL) 30 MG capsule Take  1 capsule (30 mg total) by mouth at bedtime as needed for sleep. (Patient not taking: Reported on 12/05/2018) 30 capsule 1 Not Taking at Unknown time    Patient Stressors: Financial difficulties Marital or family conflict Medication change or noncompliance  Patient Strengths: General fund of knowledge Supportive family/friends  Treatment Modalities: Medication Management, Group therapy, Case management,  1 to 1 session with clinician, Psychoeducation, Recreational therapy.   Physician Treatment Plan for Primary Diagnosis: Schizophrenia (HCC) Long Term Goal(s): Improvement in symptoms so as ready for discharge Improvement in symptoms so as ready for discharge   Short Term Goals: Ability to verbalize feelings will improve Ability to identify and develop effective coping behaviors will improve Compliance with prescribed medications will improve Ability to identify changes in lifestyle to reduce recurrence of condition will improve Ability to verbalize feelings will improve Ability to demonstrate self-control will improve Compliance with prescribed medications will improve  Medication Management: Evaluate patient's response, side effects, and tolerance of medication regimen.  Therapeutic Interventions: 1 to 1 sessions, Unit Group sessions and Medication administration.  Evaluation of Outcomes: Progressing  Physician Treatment Plan for Secondary Diagnosis: Principal Problem:   Schizophrenia (HCC)  Long Term Goal(s): Improvement in symptoms so as ready for discharge Improvement in symptoms so  as ready for discharge   Short Term Goals: Ability to verbalize feelings will improve Ability to identify and develop effective coping behaviors will improve Compliance with prescribed medications will improve Ability to identify changes in lifestyle to reduce recurrence of condition will improve Ability to verbalize feelings will improve Ability to demonstrate self-control will  improve Compliance with prescribed medications will improve     Medication Management: Evaluate patient's response, side effects, and tolerance of medication regimen.  Therapeutic Interventions: 1 to 1 sessions, Unit Group sessions and Medication administration.  Evaluation of Outcomes: Progressing   RN Treatment Plan for Primary Diagnosis: Schizophrenia ([place]) Long Term Goal(s): Knowledge of disease and therapeutic regimen to maintain health will improve  Short Term Goals: Ability to participate in decision making will improve, Ability to verbalize feelings will improve, Ability to disclose and discuss suicidal ideas, Ability to identify and develop effective coping behaviors will improve and Compliance with prescribed medications will improve  Medication Management: RN will administer medications as ordered by provider, will assess and evaluate patient's response and provide education to patient for prescribed medication. RN will report any adverse and/or side effects to prescribing provider.  Therapeutic Interventions: 1 on 1 counseling sessions, Psychoeducation, Medication administration, Evaluate responses to treatment, Monitor vital signs and CBGs as ordered, Perform/monitor CIWA, COWS, AIMS and Fall Risk screenings as ordered, Perform wound care treatments as ordered.  Evaluation of Outcomes: Progressing   LCSW Treatment Plan for Primary Diagnosis: Schizophrenia ([place]) Long Term Goal(s): Safe transition to appropriate next level of care at discharge, Engage patient in therapeutic group addressing interpersonal concerns.  Short Term Goals: Engage patient in aftercare planning with referrals and resources and Increase skills for wellness and recovery  Therapeutic Interventions: Assess for all discharge needs, 1 to 1 time with Social worker, Explore available resources and support systems, Assess for adequacy in community support network, Educate family and significant other(s) on  suicide prevention, Complete Psychosocial Assessment, Interpersonal group therapy.  Evaluation of Outcomes: Progressing   Progress in Treatment: Attending groups: No. Participating in groups: No. Taking medication as prescribed: Yes. Toleration medication: Yes. Family/Significant other contact made: No, will contact:  will contact if given consent to contact Patient understands diagnosis: No. Discussing patient identified problems/goals with staff: Yes. Medical problems stabilized or resolved: Yes. Denies suicidal/homicidal ideation: Yes. Issues/concerns per patient self-inventory: No. Other:   New problem(s) identified: No, Describe:  None  New Short Term/Long Term Goal(s): Medication stabilization, elimination of SI thoughts, and development of a comprehensive mental wellness plan.   Patient Goals:    Discharge Plan or Barriers: CSW will continue to follow up for appropriate referrals and possible discharge planning  Reason for Continuation of Hospitalization: Aggression Hallucinations Medication stabilization  Estimated Length of Stay: 3-5 days   Attendees: Patient: 12/06/2018   Physician: Dr. Neita Garnet, MD 12/06/2018  Nursing: Nicoletta Dress, RN 12/06/2018  RN Care Manager: 12/06/2018   Social Worker: Ardelle Anton, LCSW  12/06/2018   Recreational Therapist:  12/06/2018  Other:  12/06/2018   Other:  12/06/2018  Other: 12/06/2018     Scribe for Treatment Team: Trecia Rogers, LCSW 12/06/2018 11:32 AM

## 2018-12-06 NOTE — Progress Notes (Signed)
[place] NOVEL CORONAVIRUS (COVID-19) DAILY CHECK-OFF SYMPTOMS - answer yes or no to each - every day NO YES  Have you had a fever in the past 24 hours?  . Fever (Temp > 37.80C / 100F) X   Have you had any of these symptoms in the past 24 hours? . New Cough .  Sore Throat  .  Shortness of Breath .  Difficulty Breathing .  Unexplained Body Aches   X   Have you had any one of these symptoms in the past 24 hours not related to allergies?   . Runny Nose .  Nasal Congestion .  Sneezing   X   If you have had runny nose, nasal congestion, sneezing in the past 24 hours, has it worsened?  X   EXPOSURES - check yes or no X   Have you traveled outside the state in the past 14 days?  X   Have you been in contact with someone with a confirmed diagnosis of COVID-19 or PUI in the past 14 days without wearing appropriate PPE?  X   Have you been living in the same home as a person with confirmed diagnosis of COVID-19 or a PUI (household contact)?    X   Have you been diagnosed with COVID-19?    X              What to do next: Answered NO to all: Answered YES to anything:   Proceed with unit schedule Follow the [facility] Inpatient Flowsheet.   
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
## 2018-12-06 NOTE — Progress Notes (Signed)
D. Pt presents as alert, calm and cooperative- observed laughing to himself in the hallway -  Pt currently denies SI/HI and AVH but appears to be responding to internal stimuli at times  A. Labs and vitals monitored. Pt compliant with medications. Pt supported emotionally and encouraged to express concerns and ask questions.   R. Pt remains safe with 15 minute checks. Will continue POC.

## 2018-12-07 DIAGNOSIS — F201 Disorganized schizophrenia: Secondary | ICD-10-CM

## 2018-12-07 NOTE — Progress Notes (Signed)
Patient ID: Wesley Peterson, male   DOB: 05/22/1985, 33 y.o.   MRN: [id number]  [place] NOVEL CORONAVIRUS (COVID-19) DAILY CHECK-OFF SYMPTOMS - answer yes or no to each - every day NO YES  Have you had a fever in the past 24 hours?  . Fever (Temp > 37.80C / 100F) X   Have you had any of these symptoms in the past 24 hours? . New Cough .  Sore Throat  .  Shortness of Breath .  Difficulty Breathing .  Unexplained Body Aches   X   Have you had any one of these symptoms in the past 24 hours not related to allergies?   . Runny Nose .  Nasal Congestion .  Sneezing   X   If you have had runny nose, nasal congestion, sneezing in the past 24 hours, has it worsened?  X   EXPOSURES - check yes or no X   Have you traveled outside the state in the past 14 days?  X   Have you been in contact with someone with a confirmed diagnosis of COVID-19 or PUI in the past 14 days without wearing appropriate PPE?  X   Have you been living in the same home as a person with confirmed diagnosis of COVID-19 or a PUI (household contact)?    X   Have you been diagnosed with COVID-19?    X              What to do next: Answered NO to all: Answered YES to anything:   Proceed with unit schedule Follow the [facility] Inpatient Flowsheet.   

## 2018-12-07 NOTE — [facility] Group Notes (Signed)
[facility] Group Notes: (Clinical Social Work)   12/07/2018      Type of Therapy:  Group Therapy   Participation Level:  Did Not Attend - was invited both individually by MHT and by overhead announcement, chose not to attend.   Zuhayr Deeney Grossman-Orr, LCSW 12/07/2018, 2:24 PM     

## 2018-12-07 NOTE — Plan of Care (Signed)
  Problem: Coping: Goal: Ability to identify and develop effective coping behavior will improve Outcome: Progressing Goal: Ability to use eye contact when communicating with others will improve Outcome: Not Progressing
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
## 2018-12-07 NOTE — Progress Notes (Signed)
   12/07/18 2055  Psych Admission Type (Psych Patients Only)  Admission Status Involuntary  Psychosocial Assessment  Patient Complaints None  Eye Contact Fair  Facial Expression Flat  Affect Appropriate to circumstance  Speech Soft  Interaction Forwards little;Minimal  Motor Activity Slow (WNL)  Appearance/Hygiene Unremarkable  Behavior Characteristics Cooperative;Appropriate to situation  Mood Preoccupied;Pleasant  Thought Process  Coherency Concrete thinking  Content Preoccupation  Delusions None reported or observed  Perception Hallucinations  Hallucination Auditory  Judgment Impaired  Confusion None  Danger to Self  Current suicidal ideation? Denies

## 2018-12-08 NOTE — [facility] Group Notes (Signed)
[facility] LCSW Group Therapy Note  Date/Time:  12/08/2018  11:00AM-12:00PM  Type of Therapy and Topic:  Group Therapy:  Music and Mood  Participation Level:  Active   Description of Group: In this process group, members listened to a variety of genres of music and identified that different types of music evoke different responses.  Patients were encouraged to identify music that was soothing for them and music that was energizing for them.  Patients discussed how this knowledge can help with wellness and recovery in various ways including managing depression and anxiety as well as encouraging healthy sleep habits.    Therapeutic Goals: 1. Patients will explore the impact of different varieties of music on mood 2. Patients will verbalize the thoughts they have when listening to different types of music 3. Patients will identify music that is soothing to them as well as music that is energizing to them 4. Patients will discuss how to use this knowledge to assist in maintaining wellness and recovery 5. Patients will explore the use of music as a coping skill  Summary of Patient Progress:  At the beginning of group, patient expressed that he felt good.  At the end of group, patient expressed that he felt "better" and he was visibly more active and lighthearted.    Therapeutic Modalities: Solution Focused Brief Therapy Activity   Selmer Dominion, LCSW

## 2018-12-08 NOTE — Progress Notes (Signed)
   12/08/18 2203  Psych Admission Type (Psych Patients Only)  Admission Status Involuntary  Psychosocial Assessment  Patient Complaints None  Eye Contact Fair  Facial Expression Animated  Affect Appropriate to circumstance  Speech Soft  Interaction Minimal;Isolative  Motor Activity Other (Comment) (WNL)  Appearance/Hygiene Unremarkable  Behavior Characteristics Cooperative;Appropriate to situation  Mood Pleasant  Thought Process  Coherency Concrete thinking  Content Preoccupation  Delusions None reported or observed  Perception Hallucinations  Hallucination Auditory  Judgment Impaired  Confusion None  Danger to Self  Current suicidal ideation? Denies  Danger to Others  Danger to Others None reported or observed

## 2018-12-08 NOTE — Plan of Care (Signed)
  Problem: Coping: Goal: Ability to use eye contact when communicating with others will improve Outcome: Progressing   Problem: Education: Goal: Emotional status will improve Outcome: Progressing   Problem: Activity: Goal: Interest or engagement in activities will improve Outcome: Progressing

## 2018-12-08 NOTE — Progress Notes (Addendum)
[facility] MD Progress Note  12/08/2018 8:52 AM Wesley Peterson  MRN:  [id number] Subjective:  "I'm fine."  Wesley Peterson found in his room. He presents with fuller range of affect today and reports good mood. On assessment he shows no signs of responding to internal stimuli, but per nursing report he has still been laughing inappropriately at times. No delusional or paranoid thought content expressed. He is calm and cooperative. He reports good sleep overnight, with 5.5 hours recorded. He admits to breaking windows at home prior to admission but is vague when explaining this and problems with family. He spoke on the phone with his mother this morning. He denies SI/HI/AVH.  From admission H&P: 33 year old male, known to our unit from a recent admission to [facility] from 11/19 through 11/24. At the time was admitted due to disorganized behaviors and thoughts, internal preoccupation, violent outbursts. He was discharged on 11/24. Reportedly at home patient presented with a recurrence of agitation and disorganized behaviors,was internally preoccupied, disorganized,and exhibiting violent outburst/smashed car windows.  Principal Problem: Schizophrenia (HCC) Diagnosis: Principal Problem:   Schizophrenia (HCC)  Total Time spent with patient: 15 minutes  Past Psychiatric History: See admission H&P  Past Medical History:  Past Medical History:  Diagnosis Date  . Schizophrenia (HCC)    History reviewed. No pertinent surgical history. Family History: History reviewed. No pertinent family history. Family Psychiatric  History: See admission H&P Social History:  Social History   Substance and Sexual Activity  Alcohol Use Yes  . Alcohol/week: 1.0 standard drinks  . Types: 1 Cans of beer per week     Social History   Substance and Sexual Activity  Drug Use Never    Social History   Socioeconomic History  . Marital status: Single    Spouse name: Not on file  . Number of children: 1  . Years of education:  Not on file  . Highest education level: Not on file  Occupational History  . Occupation: Unemployed  Social Needs  . Financial resource strain: Not on file  . Food insecurity    Worry: Not on file    Inability: Not on file  . Transportation needs    Medical: Not on file    Non-medical: Not on file  Tobacco Use  . Smoking status: Never Smoker  . Smokeless tobacco: Never Used  Substance and Sexual Activity  . Alcohol use: Yes    Alcohol/week: 1.0 standard drinks    Types: 1 Cans of beer per week  . Drug use: Never  . Sexual activity: Never  Lifestyle  . Physical activity    Days per week: Not on file    Minutes per session: Not on file  . Stress: Not on file  Relationships  . Social Musician on phone: Not on file    Gets together: Not on file    Attends religious service: Not on file    Active member of club or organization: Not on file    Attends meetings of clubs or organizations: Not on file    Relationship status: Not on file  Other Topics Concern  . Not on file  Social History Narrative   Pt lives with mother and other relatives.  Currently unemployed.  Receives outpatient psychiatry services through [facility].   Additional Social History:                         Sleep: Good  Appetite:  Good  Current Medications: Current Facility-Administered Medications  Medication Dose Route Frequency Provider Last Rate Last Dose  . acetaminophen (TYLENOL) tablet 650 mg  650 mg Oral Q6H PRN Aldean BakerSykes, Janet E, NP   650 mg at 12/05/18 0907  . alum & mag hydroxide-simeth (MAALOX/MYLANTA) 200-200-20 MG/5ML suspension 30 mL  30 mL Oral Q4H PRN Aldean BakerSykes, Janet E, NP      . benztropine (COGENTIN) tablet 1 mg  1 mg Oral BID Aldean BakerSykes, Janet E, NP   1 mg at 12/08/18 0736  . magnesium hydroxide (MILK OF MAGNESIA) suspension 30 mL  30 mL Oral Daily PRN Aldean BakerSykes, Janet E, NP      . OLANZapine zydis (ZYPREXA) disintegrating tablet 5 mg  5 mg Oral Q8H PRN Aldean BakerSykes, Janet E, NP      .  omega-3 acid ethyl esters (LOVAZA) capsule 1 g  1 g Oral BID Aldean BakerSykes, Janet E, NP   1 g at 12/08/18 0736  . risperiDONE (RISPERDAL M-TABS) disintegrating tablet 3 mg  3 mg Oral BID Cobos, Rockey SituFernando A, MD   3 mg at 12/08/18 0736  . traZODone (DESYREL) tablet 50 mg  50 mg Oral QHS PRN Cobos, Rockey SituFernando A, MD   50 mg at 12/07/18 2053    Lab Results: No results found for this or any previous visit (from the past 48 hour(s)).  Blood Alcohol level:  Lab Results  Component Value Date   ETH <10 11/25/2018    Metabolic Disorder Labs: Lab Results  Component Value Date   HGBA1C 5.5 11/28/2018   MPG 111.15 11/28/2018   Lab Results  Component Value Date   PROLACTIN 23.4 (H) 11/28/2018   Lab Results  Component Value Date   CHOL 161 11/28/2018   TRIG 53 11/28/2018   HDL 55 11/28/2018   CHOLHDL 2.9 11/28/2018   VLDL 11 11/28/2018   LDLCALC 95 11/28/2018    Physical Findings: AIMS: Facial and Oral Movements Muscles of Facial Expression: None, normal Lips and Perioral Area: None, normal Jaw: None, normal Tongue: None, normal,Extremity Movements Upper (arms, wrists, hands, fingers): None, normal Lower (legs, knees, ankles, toes): None, normal, Trunk Movements Neck, shoulders, hips: None, normal, Overall Severity Severity of abnormal movements (highest score from questions above): None, normal Incapacitation due to abnormal movements: None, normal Patient's awareness of abnormal movements (rate only patient's report): No Awareness, Dental Status Current problems with teeth and/or dentures?: No Does patient usually wear dentures?: No  CIWA:  CIWA-Ar Total: 2 COWS:  COWS Total Score: 2  Musculoskeletal: Strength & Muscle Tone: within normal limits Gait & Station: normal Patient leans: N/A  Psychiatric Specialty Exam: Physical Exam  Nursing note and vitals reviewed. Constitutional: He is oriented to person, place, and time. He appears well-developed and well-nourished.  Cardiovascular:  Normal rate.  Respiratory: Effort normal.  Neurological: He is alert and oriented to person, place, and time.    Review of Systems  Constitutional: Negative.   Respiratory: Negative for cough and shortness of breath.   Cardiovascular: Negative for chest pain.  Psychiatric/Behavioral: Negative for depression, hallucinations, substance abuse and suicidal ideas. The patient is not nervous/anxious and does not have insomnia.     Blood pressure 123/86, pulse 96, temperature 97.7 F (36.5 C), temperature source Oral, resp. rate 18, height 6\' 1"  (1.854 m), weight 68 kg, SpO2 98 %.Body mass index is 19.79 kg/m.  General Appearance: Casual  Eye Contact:  Fair  Speech:  Normal Rate  Volume:  Normal  Mood:  Euthymic  Affect:  Appropriate  and Congruent  Thought Process:  Coherent  Orientation:  Full (Time, Place, and Person)  Thought Content:  Tangential  Suicidal Thoughts:  No  Homicidal Thoughts:  No  Memory:  Immediate;   Fair Recent;   Fair  Judgement:  Fair  Insight:  Lacking  Psychomotor Activity:  Normal  Concentration:  Concentration: Fair and Attention Span: Fair  Recall:  [employer] of Knowledge:  Fair  Language:  Fair  Akathisia:  No  Handed:  Right  AIMS (if indicated):     Assets:  Communication Skills Desire for Improvement Housing Resilience Social Support  ADL's:  Intact  Cognition:  WNL  Sleep:  Number of Hours: 5.5     Treatment Plan Summary: Daily contact with patient to assess and evaluate symptoms and progress in treatment and Medication management   Continue inpatient hospitalization.  Continue Risperdal 3 mg PO BID for psychosis Continue Cogentin 1 mg PO BID for EPS Continue Lovaza 1 g PO BID for neuroprotection Continue trazodone 50 mg PO QHS PRN insomnia  Patient will participate in the therapeutic group milieu.  Discharge disposition in progress.   Connye Burkitt, NP 12/08/2018, 8:52 AM   Attest to NP Note

## 2018-12-08 NOTE — Progress Notes (Signed)
Patient ID: Wesley Peterson, male   DOB: Nov 09, 1985, 33 y.o.   MRN: [id number]  [place] NOVEL CORONAVIRUS (COVID-19) DAILY CHECK-OFF SYMPTOMS - answer yes or no to each - every day NO YES  Have you had a fever in the past 24 hours?  . Fever (Temp > 37.80C / 100F) X   Have you had any of these symptoms in the past 24 hours? . New Cough .  Sore Throat  .  Shortness of Breath .  Difficulty Breathing .  Unexplained Body Aches   X   Have you had any one of these symptoms in the past 24 hours not related to allergies?   . Runny Nose .  Nasal Congestion .  Sneezing   X   If you have had runny nose, nasal congestion, sneezing in the past 24 hours, has it worsened?  X   EXPOSURES - check yes or no X   Have you traveled outside the state in the past 14 days?  X   Have you been in contact with someone with a confirmed diagnosis of COVID-19 or PUI in the past 14 days without wearing appropriate PPE?  X   Have you been living in the same home as a person with confirmed diagnosis of COVID-19 or a PUI (household contact)?    X   Have you been diagnosed with COVID-19?    X              What to do next: Answered NO to all: Answered YES to anything:   Proceed with unit schedule Follow the [facility] Inpatient Flowsheet.

## 2018-12-08 NOTE — Progress Notes (Signed)
Adult Psychoeducational Group Note  Date:  12/08/2018 Time:  1:34 AM  Group Topic/Focus:  Wrap-Up Group:   The focus of this group is to help patients review their daily goal of treatment and discuss progress on daily workbooks.  Participation Level:  Active  Participation Quality:  Appropriate  Affect:  Appropriate  Cognitive:  Disorganized, Confused and Lacking  Insight: Lacking and Limited  Engagement in Group:  Limited  Modes of Intervention:  Discussion  Additional Comments:  Pt stated his goal for today was to focus on his treatment plan. Pt stated he accomplished his goal today. Pt stated his relationship with his family has improved since he was admitted here. Pt stated he felt better about himself today. Pt rated his over all day an 8 out of 10. Pt stated his appetite was pretty good today. Pt stated he did not sleep well last night. Pt stated his goal for tonight was to get some rest. Pt stated he was in physical pain tonight.Pt stated he had pain all of his body tonight. Pt's nurse was made aware of the situation.Pt stated he was not hearing or seeing anything that was not there. Pt stated he had no thoughts of harming himself or others. Pt stated if anything change he would alert staff.   Candy Sledge 12/08/2018, 1:34 AM

## 2018-12-08 NOTE — Progress Notes (Signed)
Adult Psychoeducational Group Note  Date:  12/08/2018 Time:  10:38 PM  Group Topic/Focus:  Wrap-Up Group:   The focus of this group is to help patients review their daily goal of treatment and discuss progress on daily workbooks.  Participation Level:  Active  Participation Quality:  Appropriate  Affect:  Anxious and Excited  Cognitive:  Disorganized and Confused  Insight: Improving  Engagement in Group:  Developing/Improving and Limited  Modes of Intervention:  Discussion  Additional Comments: Pt stated his goal for today was to focus on his treatment plan. Pt stated he accomplished his goal today. Pt stated his relationship with his family has improved since he was admitted here. Pt stated been able to contact his father today help improve his day.  Pt stated he felt better about himself today. Pt rated his over all day an 8 out of 10.Pt stated his appetite was pretty good today. Pt stated he did not sleep welllast night. Pt stated his goal for tonight was to get some rest. Pt stated he was in physical pain tonight.Pt stated he had pain in his back and right hand tonight. Pt's nurse was made aware of the situation.Pt stated he was not hearing or seeing anything that was not there. Pt stated he had no thoughts of harming himself or others. Pt stated if anything change he would alert staff.   Candy Sledge 12/08/2018, 10:38 PM

## 2018-12-09 NOTE — Progress Notes (Signed)
   12/09/18 2100  Psych Admission Type (Psych Patients Only)  Admission Status Involuntary  Psychosocial Assessment  Patient Complaints None  Eye Contact Fair  Facial Expression Animated  Affect Appropriate to circumstance  Speech Soft  Interaction Minimal;Isolative  Motor Activity Other (Comment) (WNL)  Appearance/Hygiene Unremarkable  Behavior Characteristics Cooperative  Mood Preoccupied  Thought Process  Coherency Concrete thinking  Content Preoccupation  Delusions None reported or observed  Perception Hallucinations  Hallucination Auditory  Judgment Impaired  Confusion None  Danger to Self  Current suicidal ideation? Denies  Danger to Others  Danger to Others None reported or observed

## 2018-12-09 NOTE — Progress Notes (Signed)
[facility] MD Progress Note  12/09/2018 7:55 AM Wesley Peterson  MRN:  [id number] Subjective:   Wesley Peterson required readmission within 24 hours of discharge, representing due to agitation, destruction of property and medication noncompliance.  Mother was opposed to long-acting injectable medication at that point in time  The patient is still lying in bed but he is alert and oriented and cooperative with the interview process although he continues to display prominent negative symptoms but he denies thoughts of harming self or others at this point and he denies auditory and visual hallucinations clearly in need of long-acting injectable so we will discuss this again with mother Principal Problem: Schizophrenia (HCC) Diagnosis: Principal Problem:   Schizophrenia (HCC)  Total Time spent with patient: 20 minutes  Past Psychiatric History: As discussed readmitted within 24 hours of last discharge  Past Medical History:  Past Medical History:  Diagnosis Date  . Schizophrenia (HCC)    History reviewed. No pertinent surgical history. Family History: History reviewed. No pertinent family history. Family Psychiatric  History: No new data Social History:  Social History   Substance and Sexual Activity  Alcohol Use Yes  . Alcohol/week: 1.0 standard drinks  . Types: 1 Cans of beer per week     Social History   Substance and Sexual Activity  Drug Use Never    Social History   Socioeconomic History  . Marital status: Single    Spouse name: Not on file  . Number of children: 1  . Years of education: Not on file  . Highest education level: Not on file  Occupational History  . Occupation: Unemployed  Social Needs  . Financial resource strain: Not on file  . Food insecurity    Worry: Not on file    Inability: Not on file  . Transportation needs    Medical: Not on file    Non-medical: Not on file  Tobacco Use  . Smoking status: Never Smoker  . Smokeless tobacco: Never Used  Substance and Sexual  Activity  . Alcohol use: Yes    Alcohol/week: 1.0 standard drinks    Types: 1 Cans of beer per week  . Drug use: Never  . Sexual activity: Never  Lifestyle  . Physical activity    Days per week: Not on file    Minutes per session: Not on file  . Stress: Not on file  Relationships  . Social Musician on phone: Not on file    Gets together: Not on file    Attends religious service: Not on file    Active member of club or organization: Not on file    Attends meetings of clubs or organizations: Not on file    Relationship status: Not on file  Other Topics Concern  . Not on file  Social History Narrative   Pt lives with mother and other relatives.  Currently unemployed.  Receives outpatient psychiatry services through [facility].   Additional Social History:                         Sleep: Fair  Appetite:  Poor  Current Medications: Current Facility-Administered Medications  Medication Dose Route Frequency Provider Last Rate Last Dose  . acetaminophen (TYLENOL) tablet 650 mg  650 mg Oral Q6H PRN Aldean Baker, NP   650 mg at 12/08/18 2242  . alum & mag hydroxide-simeth (MAALOX/MYLANTA) 200-200-20 MG/5ML suspension 30 mL  30 mL Oral Q4H PRN Aldean Baker, NP      .  benztropine (COGENTIN) tablet 1 mg  1 mg Oral BID Aldean BakerSykes, Janet E, NP   1 mg at 12/08/18 1711  . magnesium hydroxide (MILK OF MAGNESIA) suspension 30 mL  30 mL Oral Daily PRN Aldean BakerSykes, Janet E, NP      . OLANZapine zydis (ZYPREXA) disintegrating tablet 5 mg  5 mg Oral Q8H PRN Aldean BakerSykes, Janet E, NP      . omega-3 acid ethyl esters (LOVAZA) capsule 1 g  1 g Oral BID Aldean BakerSykes, Janet E, NP   1 g at 12/08/18 1711  . risperiDONE (RISPERDAL M-TABS) disintegrating tablet 3 mg  3 mg Oral BID Cobos, Rockey SituFernando A, MD   3 mg at 12/08/18 1711  . traZODone (DESYREL) tablet 50 mg  50 mg Oral QHS PRN Cobos, Rockey SituFernando A, MD   50 mg at 12/08/18 2128    Lab Results: No results found for this or any previous visit (from the past 48  hour(s)).  Blood Alcohol level:  Lab Results  Component Value Date   ETH <10 11/25/2018    Metabolic Disorder Labs: Lab Results  Component Value Date   HGBA1C 5.5 11/28/2018   MPG 111.15 11/28/2018   Lab Results  Component Value Date   PROLACTIN 23.4 (H) 11/28/2018   Lab Results  Component Value Date   CHOL 161 11/28/2018   TRIG 53 11/28/2018   HDL 55 11/28/2018   CHOLHDL 2.9 11/28/2018   VLDL 11 11/28/2018   LDLCALC 95 11/28/2018    Physical Findings: AIMS: Facial and Oral Movements Muscles of Facial Expression: None, normal Lips and Perioral Area: None, normal Jaw: None, normal Tongue: None, normal,Extremity Movements Upper (arms, wrists, hands, fingers): None, normal Lower (legs, knees, ankles, toes): None, normal, Trunk Movements Neck, shoulders, hips: None, normal, Overall Severity Severity of abnormal movements (highest score from questions above): None, normal Incapacitation due to abnormal movements: None, normal Patient's awareness of abnormal movements (rate only patient's report): No Awareness, Dental Status Current problems with teeth and/or dentures?: No Does patient usually wear dentures?: No  CIWA:  CIWA-Ar Total: 2 COWS:  COWS Total Score: 2  Musculoskeletal: Strength & Muscle Tone: within normal limits Gait & Station: normal Patient leans: N/A  Psychiatric Specialty Exam: Physical Exam  ROS  Blood pressure (!) 127/91, pulse (!) 102, temperature 98 F (36.7 C), temperature source Oral, resp. rate 18, height 6\' 1"  (1.854 m), weight 68 kg, SpO2 98 %.Body mass index is 19.79 kg/m.  General Appearance: Casual  Eye Contact:  Minimal  Speech:  Slow  Volume:  Decreased  Mood:  Dysphoric  Affect:  Blunt and Congruent  Thought Process:  Descriptions of Associations: Circumstantial  Orientation:  Other:  Person place situation  Thought Content:  Poverty of content but denies hallucinations  Suicidal Thoughts:  No  Homicidal Thoughts:  No   Memory:  Immediate;   Fair Recent;   Good Remote;   Fair  Judgement:  Fair  Insight:  Shallow  Psychomotor Activity:  Decreased  Concentration:  Concentration: Poor and Attention Span: Fair  Recall:  [employer]  Fund of Knowledge:  Fair  Language:  Fair  Akathisia:  Negative  Handed:  Right  AIMS (if indicated):     Assets:  Physical Health Resilience  ADL's:  Intact  Cognition:  WNL  Sleep:  Number of Hours: 4     Treatment Plan Summary: Daily contact with patient to assess and evaluate symptoms and progress in treatment and Medication management  Continue current precautions phone parents  regarding long-acting injectable clearly in need of this no thoughts of harming self so monitor on 15-minute precautions continue antipsychotic therapy reality based therapy  Aracelis Ulrey, MD 12/09/2018, 7:55 AM

## 2018-12-09 NOTE — [facility] Suicide Risk Assessment (Signed)
[place] INPATIENT:  Family/Significant Other Suicide Prevention Education  Suicide Prevention Education:  Education Completed; pt's mother, Katharina Caper [phone]) has been identified by the patient as the family member/significant other with whom the patient will be residing, and identified as the person(s) who will aid the patient in the event of a mental health crisis (suicidal ideations/suicide attempt).  With written consent from the patient, the family member/significant other has been provided the following suicide prevention education, prior to the and/or following the discharge of the patient.  The suicide prevention education provided includes the following:  Suicide risk factors  Suicide prevention and interventions  National Suicide Hotline telephone number  [facility] assessment telephone number  [facility] Emergency Assistance [place] and/or Residential Mobile Crisis Unit telephone number  Request made of family/significant other to:  Remove weapons (e.g., guns, rifles, knives), all items previously/currently identified as safety concern.    Remove drugs/medications (over-the-counter, prescriptions, illicit drugs), all items previously/currently identified as a safety concern.  The family member/significant other verbalizes understanding of the suicide prevention education information provided.  The family member/significant other agrees to remove the items of safety concern listed above.  Mother reports pt "his dad and his brother thought that he needed to seek some mental health treatment".  Mother further reports that others have observed the patient talking to himself.  Mother reports that patient is a patient of [facility]. Mother reports that "I am not saying that he can't come back here but I need him to take his medication.  The medicine must go in my hands for me to give him and he has to take it."  Mother requests that  patient be told that he has to attend his appointments and take his medications.   Rozann Lesches 12/09/2018, 11:27 AM

## 2018-12-09 NOTE — Progress Notes (Signed)
Recreation Therapy Notes  Date: 11.30.20 Time: 1000 Location: 500 Hall Dayroom  Group Topic: Wellness  Goal Area(s) Addresses:  Patient will define components of whole wellness. Patient will verbalize benefit of whole wellness.  Intervention:  Music  Activity: Exercise.  LRT led patients in a series of stretches to get them loosened up for group.  Each patient gets the chance to lead the group in an exercise of their choosing.  The group is to complete at least 30 minutes of exercise.  Patients could take breaks or get water as needed.  Education: Wellness, Dentist.   Education Outcome: Acknowledges education/In group clarification offered/Needs additional education.   Clinical Observations/Feedback:  Pt did not attend group.    Victorino Sparrow, LRT/CTRS         Victorino Sparrow A 12/09/2018 11:34 AM

## 2018-12-09 NOTE — Progress Notes (Signed)
CSW spoke with patient at bedside at patient request.   Patient requested housing resources, preferably in [place] or [place]. CSW provided patient with [facility] and the contact information for [employer] [employer]. CSW reviewed to process of contacting [employer] [facility] and encouraged patient to call this afternoon. Patient expressed understanding.  Patient shared with CSW that he is unsure if he wants to return to stay with his mother at discharge, he reports his mother has brought him to [facility] and [place] to assist the patient in obtaining SSI. Patient asked several questions regarding obtaining social security income, CSW advised patient to follow up with his local SSI office for specific information or application instructions.  Patient's outpatient follow up needs are dependent upon living arrangements at discharge.  Stephanie Acre, MSW, [place] Social Worker [facility] Adult Unit  [phone]

## 2018-12-10 MED ORDER — PALIPERIDONE PALMITATE ER 234 MG/1.5ML IM SUSY
234.0000 mg | PREFILLED_SYRINGE | Freq: Once | INTRAMUSCULAR | Status: AC
  Administered 2018-12-10: 234 mg via INTRAMUSCULAR
  Filled 2018-12-10: qty 1.5

## 2018-12-10 MED ORDER — HYDROXYZINE HCL 25 MG PO TABS
25.0000 mg | ORAL_TABLET | Freq: Three times a day (TID) | ORAL | Status: DC | PRN
  Filled 2018-12-10 (×3): qty 1

## 2018-12-10 NOTE — Progress Notes (Signed)
   12/10/18 1000  Psych Admission Type (Psych Patients Only)  Admission Status Involuntary  Psychosocial Assessment  Patient Complaints None  Eye Contact Fair  Facial Expression Animated  Affect Inconsistent with thought content  Speech Soft  Interaction Minimal  Motor Activity Other (Comment) (WDL)  Appearance/Hygiene Unremarkable  Behavior Characteristics Cooperative  Mood Preoccupied  Thought Process  Coherency Concrete thinking  Content Preoccupation  Delusions None reported or observed  Perception Hallucinations  Hallucination Auditory  Judgment Impaired  Confusion None  Danger to Self  Current suicidal ideation? Denies  Danger to Others  Danger to Others None reported or observed   Pt has taken multiple showers this morning. Pt laughs at inappropriate times and was reluctant to take his medications. Pt required much redirection and encouragement to take his medications. Pt blames his mother for him coming to the hospital and says that she " just wants me to get on disability." Pt reports that multiple family members receive disability including his mother and she wants him to get it too.

## 2018-12-10 NOTE — Progress Notes (Signed)
Adult Psychoeducational Group Note  Date:  12/10/2018 Time:  1:11 AM  Group Topic/Focus:  Wrap-Up Group:   The focus of this group is to help patients review their daily goal of treatment and discuss progress on daily workbooks.  Participation Level:  Minimal  Participation Quality:  Appropriate  Affect:  Appropriate  Cognitive:  Disorganized and Confused  Insight: Limited  Engagement in Group:  Developing/Improving  Modes of Intervention:  Discussion  Additional Comments: Pt stated his goal for today was to focus on his treatment plan. Pt stated he accomplished his goal today. Pt stated his relationship with his family has improved since he was admitted here. Pt stated been able to contact his mother today help improve his day. Pt stated he felt better about himself today. Pt rated his over all day an 9 out of10.Pt stated his appetite was pretty good today. Pt stated he did not sleep welllast night. Pt stated his goal for tonight was to get some rest. Pt stated he was in physical pain tonight.Pt stated he had painin his left hand tonight. Pt's nurse was made aware of the situation.Pt stated he was not hearing or seeing anything that was not there. Pt stated he had no thoughts of harming himself or others. Pt stated if anything change he would alert staff.   Candy Sledge 12/10/2018, 1:11 AM

## 2018-12-10 NOTE — Progress Notes (Signed)
[facility] MD Progress Note  12/10/2018 9:31 AM Deaire Mcwhirter  MRN:  [id number] Subjective:   Patient continues to display prominent negative and deny positive symptoms he slept fair appetite fair energy level is reportedly good but again he resists but then agrees to long-acting injectable.  No EPS or TD.  Probable discharge within 24 to 48 hours after initial injection Principal Problem: Schizophrenia (HCC) Diagnosis: Principal Problem:   Schizophrenia (HCC)  Total Time spent with patient: 15 minutes  Past Psychiatric History: Again recent admission requiring readmission within 24 hours  Past Medical History:  Past Medical History:  Diagnosis Date  . Schizophrenia (HCC)    History reviewed. No pertinent surgical history. Family History: History reviewed. No pertinent family history. Family Psychiatric  History: No new data Social History:  Social History   Substance and Sexual Activity  Alcohol Use Yes  . Alcohol/week: 1.0 standard drinks  . Types: 1 Cans of beer per week     Social History   Substance and Sexual Activity  Drug Use Never    Social History   Socioeconomic History  . Marital status: Single    Spouse name: Not on file  . Number of children: 1  . Years of education: Not on file  . Highest education level: Not on file  Occupational History  . Occupation: Unemployed  Social Needs  . Financial resource strain: Not on file  . Food insecurity    Worry: Not on file    Inability: Not on file  . Transportation needs    Medical: Not on file    Non-medical: Not on file  Tobacco Use  . Smoking status: Never Smoker  . Smokeless tobacco: Never Used  Substance and Sexual Activity  . Alcohol use: Yes    Alcohol/week: 1.0 standard drinks    Types: 1 Cans of beer per week  . Drug use: Never  . Sexual activity: Never  Lifestyle  . Physical activity    Days per week: Not on file    Minutes per session: Not on file  . Stress: Not on file  Relationships  . Social  Musician on phone: Not on file    Gets together: Not on file    Attends religious service: Not on file    Active member of club or organization: Not on file    Attends meetings of clubs or organizations: Not on file    Relationship status: Not on file  Other Topics Concern  . Not on file  Social History Narrative   Pt lives with mother and other relatives.  Currently unemployed.  Receives outpatient psychiatry services through [facility].   Additional Social History:                         Sleep: Good  Appetite:  Good  Current Medications: Current Facility-Administered Medications  Medication Dose Route Frequency Provider Last Rate Last Dose  . acetaminophen (TYLENOL) tablet 650 mg  650 mg Oral Q6H PRN Aldean Baker, NP   650 mg at 12/10/18 8295  . alum & mag hydroxide-simeth (MAALOX/MYLANTA) 200-200-20 MG/5ML suspension 30 mL  30 mL Oral Q4H PRN Aldean Baker, NP      . benztropine (COGENTIN) tablet 1 mg  1 mg Oral BID Aldean Baker, NP   1 mg at 12/10/18 0914  . magnesium hydroxide (MILK OF MAGNESIA) suspension 30 mL  30 mL Oral Daily PRN Aldean Baker, NP      .  OLANZapine zydis (ZYPREXA) disintegrating tablet 5 mg  5 mg Oral Q8H PRN Connye Burkitt, NP      . omega-3 acid ethyl esters (LOVAZA) capsule 1 g  1 g Oral BID Connye Burkitt, NP   1 g at 12/10/18 0914  . paliperidone (INVEGA SUSTENNA) injection 234 mg  234 mg Intramuscular Once Johnn Hai, MD      . risperiDONE (RISPERDAL M-TABS) disintegrating tablet 3 mg  3 mg Oral BID Cobos, Myer Peer, MD   3 mg at 12/10/18 0914  . traZODone (DESYREL) tablet 50 mg  50 mg Oral QHS PRN Cobos, Myer Peer, MD   50 mg at 12/09/18 2108    Lab Results: No results found for this or any previous visit (from the past 48 hour(s)).  Blood Alcohol level:  Lab Results  Component Value Date   ETH <10 02/58/5277    Metabolic Disorder Labs: Lab Results  Component Value Date   HGBA1C 5.5 11/28/2018   MPG 111.15  11/28/2018   Lab Results  Component Value Date   PROLACTIN 23.4 (H) 11/28/2018   Lab Results  Component Value Date   CHOL 161 11/28/2018   TRIG 53 11/28/2018   HDL 55 11/28/2018   CHOLHDL 2.9 11/28/2018   VLDL 11 11/28/2018   [place] 95 11/28/2018    Physical Findings: AIMS: Facial and Oral Movements Muscles of Facial Expression: None, normal Lips and Perioral Area: None, normal Jaw: None, normal Tongue: None, normal,Extremity Movements Upper (arms, wrists, hands, fingers): None, normal Lower (legs, knees, ankles, toes): None, normal, Trunk Movements Neck, shoulders, hips: None, normal, Overall Severity Severity of abnormal movements (highest score from questions above): None, normal Incapacitation due to abnormal movements: None, normal Patient's awareness of abnormal movements (rate only patient's report): No Awareness, Dental Status Current problems with teeth and/or dentures?: No Does patient usually wear dentures?: No  CIWA:  CIWA-Ar Total: 2 COWS:  COWS Total Score: 2  Musculoskeletal: Strength & Muscle Tone: within normal limits Gait & Station: normal Patient leans: N/A  Psychiatric Specialty Exam: Physical Exam  ROS  Blood pressure (!) 127/91, pulse (!) 102, temperature 98 F (36.7 C), temperature source Oral, resp. rate 18, height 6\' 1"  (1.854 m), weight 68 kg, SpO2 98 %.Body mass index is 19.79 kg/m.  General Appearance: Casual  Eye Contact:  Good  Speech:  Clear and Coherent  Volume:  Decreased  Mood:  Dysphoric  Affect:  Congruent  Thought Process:  Linear and Descriptions of Associations: Circumstantial  Orientation:  Other:  Fully oriented  Thought Content:  Poverty of content denies hallucinations  Suicidal Thoughts:  No  Homicidal Thoughts:  No  Memory:  Immediate;   Fair Recent;   Fair Remote;   Fair  Judgement:  Fair  Insight:  Fair  Psychomotor Activity:  Normal  Concentration:  Concentration: Fair and Attention Span: Fair  Recall:   [employer] of Knowledge:  Fair  Language:  Fair  Akathisia:  Negative  Handed:  Right  AIMS (if indicated):     Assets:  Leisure Time Physical Health  ADL's:  Intact  Cognition:  WNL  Sleep:  Number of Hours: 4   Treatment Plan Summary: Daily contact with patient to assess and evaluate symptoms and progress in treatment and Medication management  Continue current antipsychotic therapy would administer long-acting injectable probable discharge tomorrow no change in precautions continue reality based therapy   Emmi Wertheim, MD 12/10/2018, 9:31 AM

## 2018-12-10 NOTE — Progress Notes (Signed)
Recreation Therapy Notes  Date: 12.1.20 Time: 1000 Location: 500 Hall Dayroom  Group Topic: Coping Skills  Goal Area(s) Addresses:  Patient will identify positive coping skills. Patient will identify benefit of using coping skills post d/c.  Intervention: Worksheet, pencils  Activity: Mind Map.  LRT and patients filled in the first 8 boxes of the mind map with being honest, anxiety, loss of job, having kids, school, work, depression and losing someone.  Patients were then given time to come up with at least 3 coping skills for each situation identified.   Education: Radiographer, therapeutic, Dentist.   Education Outcome: Acknowledges understanding/In group clarification offered/Needs additional education.   Clinical Observations/Feedback: Pt did not attend group session.    Victorino Sparrow, LRT/CTRS         Victorino Sparrow A 12/10/2018 11:42 AM

## 2018-12-10 NOTE — Progress Notes (Signed)
   12/10/18 2100  Psych Admission Type (Psych Patients Only)  Admission Status Involuntary  Psychosocial Assessment  Patient Complaints None  Eye Contact Fair  Facial Expression Animated  Affect Inconsistent with thought content  Speech Soft  Interaction Minimal  Motor Activity Other (Comment) (WDL)  Appearance/Hygiene Unremarkable  Behavior Characteristics Cooperative  Mood Preoccupied  Thought Process  Coherency Concrete thinking  Content Preoccupation  Delusions None reported or observed  Perception Hallucinations  Hallucination Auditory  Judgment Impaired  Confusion None  Danger to Self  Current suicidal ideation? Denies  Danger to Others  Danger to Others None reported or observed    Pt forwarded minimal information, pt continued to keep to himself much of the evening. Pt refused his night medication, even after much encouragement.

## 2018-12-10 NOTE — BHH Counselor (Signed)
CSW spoke with mother, Katharina Caper [phone]). Mother asked to come meet with CSW in person to discuss the process of obtaining SSDI. CSW explained she would not be able to assist mother with this, or be able to meet in person.  Mother asked CSW several questions regarding group home placements or obtaining other housing for patient. CSW shared the barriers, primarily payor source. Mother reports she is "working on," getting patient SSDI and Medicaid. CSW explained in the meantime, patient was provided shelter resources. Mother states this is not an appropriate discharge plan and the patient can return home.  Stephanie Acre, MSW, [place] Social Worker [facility]  [phone]

## 2018-12-11 NOTE — Tx Team (Signed)
Interdisciplinary Treatment and Diagnostic Plan Update  12/11/2018 Time of Session: 10:00am Wesley Peterson MRN: [id number]  Principal Diagnosis: Schizophrenia [facility])  Secondary Diagnoses: Principal Problem:   Schizophrenia (HCC)   Current Medications:  Current Facility-Administered Medications  Medication Dose Route Frequency Provider Last Rate Last Dose  . acetaminophen (TYLENOL) tablet 650 mg  650 mg Oral Q6H PRN Aldean Baker, NP   650 mg at 12/10/18 1755  . alum & mag hydroxide-simeth (MAALOX/MYLANTA) 200-200-20 MG/5ML suspension 30 mL  30 mL Oral Q4H PRN Aldean Baker, NP      . benztropine (COGENTIN) tablet 1 mg  1 mg Oral BID Aldean Baker, NP   1 mg at 12/10/18 1752  . hydrOXYzine (ATARAX/VISTARIL) tablet 25 mg  25 mg Oral TID PRN Anike, Adaku C, NP      . magnesium hydroxide (MILK OF MAGNESIA) suspension 30 mL  30 mL Oral Daily PRN Aldean Baker, NP      . OLANZapine zydis (ZYPREXA) disintegrating tablet 5 mg  5 mg Oral Q8H PRN Aldean Baker, NP      . omega-3 acid ethyl esters (LOVAZA) capsule 1 g  1 g Oral BID Aldean Baker, NP   1 g at 12/10/18 1752  . risperiDONE (RISPERDAL M-TABS) disintegrating tablet 3 mg  3 mg Oral BID Cobos, Rockey Situ, MD   3 mg at 12/10/18 1752  . traZODone (DESYREL) tablet 50 mg  50 mg Oral QHS PRN Cobos, Rockey Situ, MD   50 mg at 12/09/18 2108   PTA Medications: Medications Prior to Admission  Medication Sig Dispense Refill Last Dose  . benztropine (COGENTIN) 1 MG tablet Take 1 tablet (1 mg total) by mouth 2 (two) times daily. (Patient not taking: Reported on 12/05/2018) 60 tablet 2 Not Taking at Unknown time  . omega-3 acid ethyl esters (LOVAZA) 1 g capsule Take 1 capsule (1 g total) by mouth 2 (two) times daily. (Patient not taking: Reported on 12/05/2018) 60 capsule 2 Not Taking at Unknown time  . risperiDONE (RISPERDAL) 3 MG tablet 1 IN AM 2 AT HS (Patient not taking: Reported on 12/05/2018) 90 tablet 1 Not Taking at Unknown time  . temazepam  (RESTORIL) 30 MG capsule Take 1 capsule (30 mg total) by mouth at bedtime as needed for sleep. (Patient not taking: Reported on 12/05/2018) 30 capsule 1 Not Taking at Unknown time    Patient Stressors: Financial difficulties Marital or family conflict Medication change or noncompliance  Patient Strengths: General fund of knowledge Supportive family/friends  Treatment Modalities: Medication Management, Group therapy, Case management,  1 to 1 session with clinician, Psychoeducation, Recreational therapy.   Physician Treatment Plan for Primary Diagnosis: Schizophrenia (HCC) Long Term Goal(s): Improvement in symptoms so as ready for discharge Improvement in symptoms so as ready for discharge   Short Term Goals: Ability to verbalize feelings will improve Ability to identify and develop effective coping behaviors will improve Compliance with prescribed medications will improve Ability to identify changes in lifestyle to reduce recurrence of condition will improve Ability to verbalize feelings will improve Ability to demonstrate self-control will improve Compliance with prescribed medications will improve  Medication Management: Evaluate patient's response, side effects, and tolerance of medication regimen.  Therapeutic Interventions: 1 to 1 sessions, Unit Group sessions and Medication administration.  Evaluation of Outcomes: Progressing  Physician Treatment Plan for Secondary Diagnosis: Principal Problem:   Schizophrenia (HCC)  Long Term Goal(s): Improvement in symptoms so as ready for discharge Improvement in symptoms  so as ready for discharge   Short Term Goals: Ability to verbalize feelings will improve Ability to identify and develop effective coping behaviors will improve Compliance with prescribed medications will improve Ability to identify changes in lifestyle to reduce recurrence of condition will improve Ability to verbalize feelings will improve Ability to demonstrate  self-control will improve Compliance with prescribed medications will improve     Medication Management: Evaluate patient's response, side effects, and tolerance of medication regimen.  Therapeutic Interventions: 1 to 1 sessions, Unit Group sessions and Medication administration.  Evaluation of Outcomes: Progressing   RN Treatment Plan for Primary Diagnosis: Schizophrenia ([place]) Long Term Goal(s): Knowledge of disease and therapeutic regimen to maintain health will improve  Short Term Goals: Ability to participate in decision making will improve, Ability to verbalize feelings will improve, Ability to disclose and discuss suicidal ideas, Ability to identify and develop effective coping behaviors will improve and Compliance with prescribed medications will improve  Medication Management: RN will administer medications as ordered by provider, will assess and evaluate patient's response and provide education to patient for prescribed medication. RN will report any adverse and/or side effects to prescribing provider.  Therapeutic Interventions: 1 on 1 counseling sessions, Psychoeducation, Medication administration, Evaluate responses to treatment, Monitor vital signs and CBGs as ordered, Perform/monitor CIWA, COWS, AIMS and Fall Risk screenings as ordered, Perform wound care treatments as ordered.  Evaluation of Outcomes: Progressing   LCSW Treatment Plan for Primary Diagnosis: Schizophrenia ([place]) Long Term Goal(s): Safe transition to appropriate next level of care at discharge, Engage patient in therapeutic group addressing interpersonal concerns.  Short Term Goals: Engage patient in aftercare planning with referrals and resources and Increase skills for wellness and recovery  Therapeutic Interventions: Assess for all discharge needs, 1 to 1 time with Social worker, Explore available resources and support systems, Assess for adequacy in community support network, Educate family and significant  other(s) on suicide prevention, Complete Psychosocial Assessment, Interpersonal group therapy.  Evaluation of Outcomes: Progressing   Progress in Treatment: Attending groups: No. Participating in groups: No. Taking medication as prescribed: Yes. Toleration medication: Yes. Family/Significant other contact made: No, will contact:  will contact if given consent to contact Patient understands diagnosis: No. Discussing patient identified problems/goals with staff: Yes. Medical problems stabilized or resolved: Yes. Denies suicidal/homicidal ideation: Yes. Issues/concerns per patient self-inventory: No. Other:   New problem(s) identified: No, Describe:  None  New Short Term/Long Term Goal(s): Medication stabilization, elimination of SI thoughts, and development of a comprehensive mental wellness plan.   Patient Goals:    Discharge Plan or Barriers: CSW will continue to follow up for appropriate referrals and possible discharge planning  Reason for Continuation of Hospitalization: Medication stabilization  Estimated Length of Stay: 1 day  Attendees: Patient: 12/11/2018   Physician: Dr. Neita Garnet, MD Dr.Farah 12/11/2018  Nursing: Nigel Bridgeman, RN 12/11/2018  RN Care Manager: 12/11/2018   Social Worker: Ardelle Anton, [place], [place] 12/11/2018   Recreational Therapist:  12/11/2018  Other:  12/11/2018   Other:  12/11/2018  Other: 12/11/2018     Scribe for Treatment Team: Joellen Jersey, [place] 12/11/2018 10:03 AM

## 2018-12-11 NOTE — Progress Notes (Signed)
Adult Psychoeducational Group Note  Date:  12/11/2018 Time:  1:47 AM  Group Topic/Focus:  Wrap-Up Group:   The focus of this group is to help patients review their daily goal of treatment and discuss progress on daily workbooks.  Participation Level:  Minimal  Participation Quality:  Appropriate  Affect:  Flat  Cognitive:  Disorganized and Confused  Insight: Limited  Engagement in Group:  Limited  Modes of Intervention:  Discussion  Additional Comments:  Pt stated his goal for today was to focus on his treatment plan. Pt stated he accomplished his goal today. Pt stated his relationship with his family has improved since he was admitted here.Pt stated been able to contact his parents today help improved his day.Pt stated he felt better about himself today. Pt rated his over all day a 10.Pt stated his appetite was pretty good today. Pt stated he did not sleep welllast night. Pt stated his goal for tonight was to get some rest. Pt stated he was in physical pain tonight.Pt stated he had painin his left handtonight. Pt's nurse was made aware of the situation.Pt stated he was not hearing or seeing anything that was not there. Pt stated he had no thoughts of harming himself or others. Pt stated if anything change he would alert staff.  Candy Sledge 12/11/2018, 1:47 AM

## 2018-12-11 NOTE — Progress Notes (Signed)
   12/11/18 1900  Psych Admission Type (Psych Patients Only)  Admission Status Involuntary  Psychosocial Assessment  Patient Complaints Anxiety  Eye Contact Fair  Facial Expression Animated;Anxious  Affect Anxious;Inconsistent with thought content;Preoccupied  Speech Logical/coherent;Soft  Interaction Avoidant;Cautious;Forwards little;Guarded;Minimal  Motor Activity Slow  Appearance/Hygiene Unremarkable  Behavior Characteristics Appropriate to situation  Mood Preoccupied  Thought Process  Coherency Concrete thinking  Content Preoccupation;Paranoia  Delusions None reported or observed  Perception Hallucinations  Hallucination Auditory  Judgment Poor  Confusion None  Danger to Self  Current suicidal ideation? Denies  Danger to Others  Danger to Others None reported or observed

## 2018-12-11 NOTE — Plan of Care (Signed)
Progress note  D: pt found in bed; hesitant but compliant with medication administration. Pt was questioning why they were still taking medication since they took their injectable. Pt provided education. Pt denies any physical complaints or pain. Pt's thought process seems to be clearer, but is still paranoid and oppositional to care in some regards. Pt is pleasant. Pt denies si/hi/ah/vh and verbally agrees to approach staff if these become apparent or before harming themself/others while at [place].  A: Pt provided support and encouragement. Pt given medication per protocol and standing orders. Q85m safety checks implemented and continued.  R: Pt safe on the unit. Will continue to monitor.  Pt progressing the following metrics  Problem: Activity: Goal: Will identify at least one activity in which they can participate Outcome: Progressing   Problem: Coping: Goal: Ability to interact with others will improve Outcome: Progressing Goal: Demonstration of participation in decision-making regarding own care will improve Outcome: Progressing   Problem: Health Behavior/Discharge Planning: Goal: Identification of resources available to assist in meeting health care needs will improve Outcome: Progressing

## 2018-12-11 NOTE — Progress Notes (Signed)
Virtual Visit via Telephone Note  I connected with Wesley Peterson on 12/11/2018 at 9:30 am  by telephone and verified that I am speaking with the correct person using two identifiers.  Location: Patient: Phone call from [place]    I discussed the limitations, risks, security and privacy concerns of performing an evaluation and management service by telephone and the availability of in person appointments. I also discussed with the patient that there may be a patient responsible charge related to this service. The patient expressed understanding and agreed to proceed.   History of Present Illness: Peterson was told that we are providing the second dose of IM paliperidone today. She knows he will need this once every four weeks. Mom is not ready to have him at home just yet. She wants to make sure he is stable so that he wont repeat previous acts of violence. Her neighbors have expressed concern with Wesley Peterson coming back.   She is diligently working on Systems analyst for [employer] and thinks this will be finalized soon.     Assessment and Plan: - Continue close monitoring.  - Follow up progress with newly added  Paliperidone.   Follow Up Instructions:    I discussed the assessment and treatment plan with the patient. The patient was provided an opportunity to ask questions and all were answered. The patient agreed with the plan and demonstrated an understanding of the instructions.   The patient was advised to call back or seek an in-person evaluation if the symptoms worsen or if the condition fails to improve as anticipated.  I provided 10 minutes of non-face-to-face time during this encounter.   Trinna Post, Medical Student

## 2018-12-11 NOTE — Progress Notes (Signed)
Recreation Therapy Notes  Date: 12.2.20 Time: 1000 Location: 500 Hall Dayroom  Group Topic: Communication  Goal Area(s) Addresses:  Patient will effectively communicate with peers in group.  Patient will verbalize benefit of healthy communication. Patient will verbalize positive effect of healthy communication on post d/c goals.  Patient will identify communication techniques that made activity effective for group.   Intervention: Drawings, Pencils, Blank Paper   Activity: Geometrical Drawings.  One person comes up a describes a geometrical drawing to the rest of the group.  The presenter will get as detailed as needed to describe the picture.  The remaining members of the group can only ask the presenter to repeat themselves.  They can not ask any specific questions.  When the presenter is finished, the group will compare their pictures to the original.  Education: Communication, Discharge Planning  Education Outcome: Acknowledges understanding/In group clarification offered/Needs additional education.   Clinical Observations/Feedback:  Pt did not attend group.    Victorino Sparrow, LRT/CTRS    Victorino Sparrow A 12/11/2018 11:27 AM

## 2018-12-11 NOTE — Progress Notes (Signed)
[facility] MD Progress Note  12/11/2018 10:43 AM Wesley Peterson  MRN:  [id number] Subjective:   Patient continues to show prominent negative but no positive symptoms here but is somewhat improved denies thoughts of harming self or others understands what it is to contract mother still has a valid concerts. No change in precautions probable discharge tomorrow after second dose of paliperidone Principal Problem: Schizophrenia (HCC) Diagnosis: Principal Problem:   Schizophrenia (HCC)  Total Time spent with patient: 20 minutes  Past Psychiatric History: Again readmitted  Past Medical History:  Past Medical History:  Diagnosis Date  . Schizophrenia (HCC)    History reviewed. No pertinent surgical history. Family History: History reviewed. No pertinent family history. Family Psychiatric  History: No new data Social History:  Social History   Substance and Sexual Activity  Alcohol Use Yes  . Alcohol/week: 1.0 standard drinks  . Types: 1 Cans of beer per week     Social History   Substance and Sexual Activity  Drug Use Never    Social History   Socioeconomic History  . Marital status: Single    Spouse name: Not on file  . Number of children: 1  . Years of education: Not on file  . Highest education level: Not on file  Occupational History  . Occupation: Unemployed  Social Needs  . Financial resource strain: Not on file  . Food insecurity    Worry: Not on file    Inability: Not on file  . Transportation needs    Medical: Not on file    Non-medical: Not on file  Tobacco Use  . Smoking status: Never Smoker  . Smokeless tobacco: Never Used  Substance and Sexual Activity  . Alcohol use: Yes    Alcohol/week: 1.0 standard drinks    Types: 1 Cans of beer per week  . Drug use: Never  . Sexual activity: Never  Lifestyle  . Physical activity    Days per week: Not on file    Minutes per session: Not on file  . Stress: Not on file  Relationships  . Social Musician on  phone: Not on file    Gets together: Not on file    Attends religious service: Not on file    Active member of club or organization: Not on file    Attends meetings of clubs or organizations: Not on file    Relationship status: Not on file  Other Topics Concern  . Not on file  Social History Narrative   Pt lives with mother and other relatives.  Currently unemployed.  Receives outpatient psychiatry services through [facility].   Additional Social History:                         Sleep: Fair  Appetite:  Fair  Current Medications: Current Facility-Administered Medications  Medication Dose Route Frequency Provider Last Rate Last Dose  . acetaminophen (TYLENOL) tablet 650 mg  650 mg Oral Q6H PRN Aldean Baker, NP   650 mg at 12/10/18 1755  . alum & mag hydroxide-simeth (MAALOX/MYLANTA) 200-200-20 MG/5ML suspension 30 mL  30 mL Oral Q4H PRN Aldean Baker, NP      . benztropine (COGENTIN) tablet 1 mg  1 mg Oral BID Aldean Baker, NP   1 mg at 12/10/18 1752  . hydrOXYzine (ATARAX/VISTARIL) tablet 25 mg  25 mg Oral TID PRN Anike, Adaku C, NP      . magnesium hydroxide (MILK  OF MAGNESIA) suspension 30 mL  30 mL Oral Daily PRN Connye Burkitt, NP      . OLANZapine zydis (ZYPREXA) disintegrating tablet 5 mg  5 mg Oral Q8H PRN Connye Burkitt, NP      . omega-3 acid ethyl esters (LOVAZA) capsule 1 g  1 g Oral BID Connye Burkitt, NP   1 g at 12/10/18 1752  . risperiDONE (RISPERDAL M-TABS) disintegrating tablet 3 mg  3 mg Oral BID Cobos, Myer Peer, MD   3 mg at 12/10/18 1752  . traZODone (DESYREL) tablet 50 mg  50 mg Oral QHS PRN Cobos, Myer Peer, MD   50 mg at 12/09/18 2108    Lab Results: No results found for this or any previous visit (from the past 48 hour(s)).  Blood Alcohol level:  Lab Results  Component Value Date   ETH <10 88/41/6606    Metabolic Disorder Labs: Lab Results  Component Value Date   HGBA1C 5.5 11/28/2018   MPG 111.15 11/28/2018   Lab Results  Component  Value Date   PROLACTIN 23.4 (H) 11/28/2018   Lab Results  Component Value Date   CHOL 161 11/28/2018   TRIG 53 11/28/2018   HDL 55 11/28/2018   CHOLHDL 2.9 11/28/2018   VLDL 11 11/28/2018   [place] 95 11/28/2018    Physical Findings: AIMS: Facial and Oral Movements Muscles of Facial Expression: None, normal Lips and Perioral Area: None, normal Jaw: None, normal Tongue: None, normal,Extremity Movements Upper (arms, wrists, hands, fingers): None, normal Lower (legs, knees, ankles, toes): None, normal, Trunk Movements Neck, shoulders, hips: None, normal, Overall Severity Severity of abnormal movements (highest score from questions above): None, normal Incapacitation due to abnormal movements: None, normal Patient's awareness of abnormal movements (rate only patient's report): No Awareness, Dental Status Current problems with teeth and/or dentures?: No Does patient usually wear dentures?: No  CIWA:  CIWA-Ar Total: 2 COWS:  COWS Total Score: 2  Musculoskeletal: Strength & Muscle Tone: within normal limits Gait & Station: normal Patient leans: N/A  Psychiatric Specialty Exam: Physical Exam  ROS  Blood pressure 126/78, pulse 88, temperature 98.6 F (37 C), temperature source Oral, resp. rate 18, height 6\' 1"  (1.854 m), weight 68 kg, SpO2 98 %.Body mass index is 19.79 kg/m.  General Appearance: Casual  Eye Contact:  Good  Speech:  Clear and Coherent  Volume:  Decreased  Mood:  Dysphoric  Affect:  Depressed and Flat  Thought Process:  Linear  Orientation:  Full (Time, Place, and Person)  Thought Content:  Poverty of content with detailed exam  Suicidal Thoughts:  No  Homicidal Thoughts:  No  Memory:  Immediate;   Fair Recent;   Fair Remote;   Good  Judgement:  Fair  Insight:  Fair  Psychomotor Activity:  Normal  Concentration:  Concentration: Fair and Attention Span: Fair  Recall:  [employer] of Knowledge:  Fair  Language:  Fair  Akathisia:  Negative  Handed:   Right  AIMS (if indicated):     Assets:  Leisure Time Physical Health  ADL's:  Intact  Cognition:  WNL  Sleep:  Number of Hours: 7     Treatment Plan Summary: Daily contact with patient to assess and evaluate symptoms and progress in treatment and Medication management  Continue current precautions probable discharge tomorrow continue to keep in touch with family no change in meds today  Yvanna Vidas, MD 12/11/2018, 10:43 AM

## 2018-12-12 MED ORDER — PALIPERIDONE PALMITATE ER 156 MG/ML IM SUSY
156.0000 mg | PREFILLED_SYRINGE | Freq: Once | INTRAMUSCULAR | Status: AC
  Administered 2018-12-13: 156 mg via INTRAMUSCULAR
  Filled 2018-12-12: qty 1

## 2018-12-12 NOTE — Progress Notes (Signed)
[facility] MD Progress Note  12/12/2018 8:49 AM Wesley Peterson  MRN:  [id number] Subjective:   Patient continues to show prominent negative but no positive symptoms here but is somewhat improved denies thoughts of harming self or others . He asked about a discharge date. Mother is still hesitant about him returning home.  No change in precautions probable discharge tomorrow after second dose of paliperidone Principal Problem: Schizophrenia ([place]) Diagnosis: Principal Problem:   Schizophrenia ([place])  Total Time spent with patient: 20 minutes  Past Psychiatric History: Again readmitted  Past Medical History:  Past Medical History:  Diagnosis Date  . Schizophrenia ([place])    History reviewed. No pertinent surgical history. Family History: History reviewed. No pertinent family history. Family Psychiatric  History: No new data Social History:  Social History   Substance and Sexual Activity  Alcohol Use Yes  . Alcohol/week: 1.0 standard drinks  . Types: 1 Cans of beer per week     Social History   Substance and Sexual Activity  Drug Use Never    Social History   Socioeconomic History  . Marital status: Single    Spouse name: Not on file  . Number of children: 1  . Years of education: Not on file  . Highest education level: Not on file  Occupational History  . Occupation: Unemployed  Social Needs  . Financial resource strain: Not on file  . Food insecurity    Worry: Not on file    Inability: Not on file  . Transportation needs    Medical: Not on file    Non-medical: Not on file  Tobacco Use  . Smoking status: Never Smoker  . Smokeless tobacco: Never Used  Substance and Sexual Activity  . Alcohol use: Yes    Alcohol/week: 1.0 standard drinks    Types: 1 Cans of beer per week  . Drug use: Never  . Sexual activity: Never  Lifestyle  . Physical activity    Days per week: Not on file    Minutes per session: Not on file  . Stress: Not on file  Relationships  . Social  Herbalist on phone: Not on file    Gets together: Not on file    Attends religious service: Not on file    Active member of club or organization: Not on file    Attends meetings of clubs or organizations: Not on file    Relationship status: Not on file  Other Topics Concern  . Not on file  Social History Narrative   Pt lives with mother and other relatives.  Currently unemployed.  [place] outpatient psychiatry services through [facility].   Additional Social History:                         Sleep: Fair  Appetite:  Fair  Current Medications: Current Facility-Administered Medications  Medication Dose Route Frequency Provider Last Rate Last Dose  . acetaminophen (TYLENOL) tablet 650 mg  650 mg Oral Q6H PRN Connye Burkitt, NP   650 mg at 12/10/18 1755  . alum & mag hydroxide-simeth (MAALOX/MYLANTA) 200-200-20 MG/5ML suspension 30 mL  30 mL Oral Q4H PRN Connye Burkitt, NP      . benztropine (COGENTIN) tablet 1 mg  1 mg Oral BID Connye Burkitt, NP   1 mg at 12/11/18 1753  . hydrOXYzine (ATARAX/VISTARIL) tablet 25 mg  25 mg Oral TID PRN Anike, Adaku C, NP      .  magnesium hydroxide (MILK OF MAGNESIA) suspension 30 mL  30 mL Oral Daily PRN Aldean BakerSykes, Janet E, NP      . OLANZapine zydis (ZYPREXA) disintegrating tablet 5 mg  5 mg Oral Q8H PRN Aldean BakerSykes, Janet E, NP      . omega-3 acid ethyl esters (LOVAZA) capsule 1 g  1 g Oral BID Aldean BakerSykes, Janet E, NP   1 g at 12/11/18 1753  . [START ON 12/13/2018] paliperidone (INVEGA SUSTENNA) injection 156 mg  156 mg Intramuscular Once Malvin JohnsFarah, Brian, MD      . risperiDONE (RISPERDAL M-TABS) disintegrating tablet 3 mg  3 mg Oral BID Cobos, Rockey SituFernando A, MD   3 mg at 12/11/18 1752  . traZODone (DESYREL) tablet 50 mg  50 mg Oral QHS PRN Cobos, Rockey SituFernando A, MD   50 mg at 12/09/18 2108    Lab Results: No results found for this or any previous visit (from the past 48 hour(s)).  Blood Alcohol level:  Lab Results  Component Value Date   ETH <10  11/25/2018    Metabolic Disorder Labs: Lab Results  Component Value Date   HGBA1C 5.5 11/28/2018   MPG 111.15 11/28/2018   Lab Results  Component Value Date   PROLACTIN 23.4 (H) 11/28/2018   Lab Results  Component Value Date   CHOL 161 11/28/2018   TRIG 53 11/28/2018   HDL 55 11/28/2018   CHOLHDL 2.9 11/28/2018   VLDL 11 11/28/2018   LDLCALC 95 11/28/2018    Physical Findings: AIMS: Facial and Oral Movements Muscles of Facial Expression: None, normal Lips and Perioral Area: None, normal Jaw: None, normal Tongue: None, normal,Extremity Movements Upper (arms, wrists, hands, fingers): None, normal Lower (legs, knees, ankles, toes): None, normal, Trunk Movements Neck, shoulders, hips: None, normal, Overall Severity Severity of abnormal movements (highest score from questions above): None, normal Incapacitation due to abnormal movements: None, normal Patient's awareness of abnormal movements (rate only patient's report): No Awareness, Dental Status Current problems with teeth and/or dentures?: No Does patient usually wear dentures?: No  CIWA:  CIWA-Ar Total: 2 COWS:  COWS Total Score: 2  Musculoskeletal: Strength & Muscle Tone: within normal limits Gait & Station: normal Patient leans: N/A  Psychiatric Specialty Exam: Physical Exam  ROS  Blood pressure 126/78, pulse 88, temperature 98.6 F (37 C), temperature source Oral, resp. rate 18, height 6\' 1"  (1.854 m), weight 68 kg, SpO2 98 %.Body mass index is 19.79 kg/m.  General Appearance: Casual  Eye Contact:  Good  Speech:  Clear and Coherent  Volume:  Decreased  Mood:  Dysphoric  Affect:  Depressed and Flat  Thought Process:  Linear  Orientation:  Full (Time, Place, and Person)  Thought Content:  Poverty of content with detailed exam  Suicidal Thoughts:  No  Homicidal Thoughts:  No  Memory:  Immediate;   Fair Recent;   Fair Remote;   Good  Judgement:  Fair  Insight:  Fair  Psychomotor Activity:  Normal   Concentration:  Concentration: Fair and Attention Span: Fair  Recall:  [employer]  Fund of Knowledge:  Fair  Language:  Fair  Akathisia:  Negative  Handed:  Right  AIMS (if indicated):     Assets:  Leisure Time Physical Health  ADL's:  Intact  Cognition:  WNL  Sleep:  Number of Hours: 7.5     Treatment Plan Summary: Daily contact with patient to assess and evaluate symptoms and progress in treatment and Medication management  Continue current precautions probable discharge tomorrow, we  will contact mother and see if she is ready for him to return.  Is scheduled for second Paliperidone IM dose today. No other changes in medication.   Manuela Neptune, Medical Student 12/12/2018, 8:49 AM

## 2018-12-12 NOTE — Progress Notes (Signed)
Psychoeducational Group Note  Date:  12/12/2018 Time:  2040  Group Topic/Focus:  Wrap-Up Group:   The focus of this group is to help patients review their daily goal of treatment and discuss progress on daily workbooks.  Participation Level: Did Not Attend  Participation Quality:  Not Applicable  Affect:  Not Applicable  Cognitive:  Not Applicable  Insight:  Not Applicable  Engagement in Group: Not Applicable  Additional Comments:  The patient did not attend group this evening.   Archie Balboa S 12/12/2018, 8:39 PM

## 2018-12-12 NOTE — Progress Notes (Signed)
   12/12/18 2000  Psych Admission Type (Psych Patients Only)  Admission Status Involuntary  Psychosocial Assessment  Patient Complaints Anxiety  Eye Contact Fair  Facial Expression Animated;Anxious  Affect Anxious;Inconsistent with thought content;Preoccupied  Speech Logical/coherent;Soft  Interaction Avoidant;Cautious;Forwards little;Guarded;Minimal  Motor Activity Slow  Appearance/Hygiene Unremarkable  Behavior Characteristics Appropriate to situation  Mood Preoccupied  Thought Process  Coherency Concrete thinking  Content Preoccupation;Paranoia  Delusions None reported or observed  Perception Hallucinations  Hallucination Auditory  Judgment Poor  Confusion None  Danger to Self  Current suicidal ideation? Denies  Danger to Others  Danger to Others None reported or observed   Pt continues to be paranoid and keeps to himself

## 2018-12-12 NOTE — [facility] Group Notes (Signed)
LCSW Group Therapy Notes 12/12/2018 2:11 PM  Type of Therapy and Topic: Group Therapy: Overcoming Obstacles  Participation Level: Did Not Attend  Description of Group:  In this group patients will be encouraged to explore what they see as obstacles to their own wellness and recovery. They will be guided to discuss their thoughts, feelings, and behaviors related to these obstacles. The group will process together ways to cope with barriers, with attention given to specific choices patients can make. Each patient will be challenged to identify changes they are motivated to make in order to overcome their obstacles. This group will be process-oriented, with patients participating in exploration of their own experiences as well as giving and receiving support and challenge from other group members.  Therapeutic Goals: 1. Patient will identify personal and current obstacles as they relate to admission. 2. Patient will identify barriers that currently interfere with their wellness or overcoming obstacles.  3. Patient will identify feelings, thought process and behaviors related to these barriers. 4. Patient will identify two changes they are willing to make to overcome these obstacles:   Summary of Patient Progress Patient sleeping, did not attend.  Therapeutic Modalities:  Cognitive Behavioral Therapy Solution Focused Therapy Motivational Interviewing Relapse Prevention Therapy  Stephanie Acre, MSW, [facility] 12/12/2018 2:11 PM

## 2018-12-12 NOTE — Progress Notes (Signed)
Virtual Visit via Telephone Note  I connected with Jozef Kowalke's mother on 12/12/2018 at 9:46 am by telephone and verified that I am speaking with the correct person using two identifiers.  Location:   I discussed the limitations, risks, security and privacy concerns of performing an evaluation and management service by telephone and the availability of in person appointments. I also discussed with the patient that there may be a patient responsible charge related to this service. The patient expressed understanding and agreed to proceed.   History of Present Illness: Mother was  Informed on current progress. She agreed on discharge tomorrow afternoon. She is comfortable with having him back at home as long as he is able to go to sleep through out the night and not wake up to destroy private property. She was told that Paliperidone may help control his impulsivity and sleeping schedule. We also talked about the possibility of starting him on trazadone, PRN for sleep and she agreed.  She desires to speak to Dr. Jake Samples  since Saagar's only outpatient provider is through [facility]  and she is wondering if Dr. Jake Samples can treat [facility] as an outpatient.   Observations/Objective:   Assessment and Plan: - Discharge tomorrow - Consider outpatient Trazadone PRN    Follow Up Instructions:    I discussed the assessment and treatment plan with the patient and his mother. The patient was provided an opportunity to ask questions and all were answered. The patient agreed with the plan and demonstrated an understanding of the instructions.   The patient was advised to call back or seek an in-person evaluation if the symptoms worsen or if the condition fails to improve as anticipated.  I provided 10 minutes of non-face-to-face time during this encounter.   Trinna Post, Medical Student

## 2018-12-12 NOTE — Progress Notes (Signed)
Recreation Therapy Notes  Date: 12.3.20 Time: 1000 Location: 500 Hall Dayroom  Group Topic: Goal Setting  Goal Area(s) Addresses:  Patient will be able to identify at least 3 life goals.  Patient will be able to identify benefit of investing in life goals.  Patient will be able to identify benefit of setting life goals.   Intervention: Worksheet  Activity: Lobbyist.  Patients were to identify goals they want to accomplish in a week, month, year and in 5 years.  Patients also identified what obstacles they would face, what they need to be successful and what they can start doing now to work towards their goals.  Education:  Discharge Planning, Coping Skills, Goal Planning   Education Outcome: Acknowledges Education/In Group Clarification Provided/Needs Additional Education  Clinical Observations:  Pt did not attend group.    Victorino Sparrow, LRT/CTRS         Victorino Sparrow A 12/12/2018 11:53 AM

## 2018-12-13 MED ORDER — RISPERIDONE 3 MG PO TABS: ORAL_TABLET | ORAL | 1 refills | Status: DC

## 2018-12-13 MED ORDER — OMEGA-3-ACID ETHYL ESTERS 1 G PO CAPS: 1.0000 g | ORAL_CAPSULE | Freq: Two times a day (BID) | ORAL | 3 refills | Status: DC

## 2018-12-13 MED ORDER — RISPERIDONE 3 MG PO TABS: 6.0000 mg | ORAL_TABLET | Freq: Every day | ORAL | Status: DC

## 2018-12-13 MED ORDER — RISPERIDONE 3 MG PO TABS
3.0000 mg | ORAL_TABLET | Freq: Every day | ORAL | Status: DC
  Filled 2018-12-13: qty 7

## 2018-12-13 MED ORDER — INVEGA SUSTENNA 234 MG/1.5ML IM SUSY: 234.0000 mg | PREFILLED_SYRINGE | Freq: Once | INTRAMUSCULAR | 12 refills | Status: DC

## 2018-12-13 MED ORDER — INVEGA SUSTENNA 234 MG/1.5ML IM SUSY: 234.0000 mg | PREFILLED_SYRINGE | Freq: Once | INTRAMUSCULAR | 11 refills | Status: DC

## 2018-12-13 MED ORDER — RISPERIDONE 3 MG PO TABS: ORAL_TABLET | ORAL | 3 refills | Status: DC

## 2018-12-13 MED ORDER — BENZTROPINE MESYLATE 1 MG PO TABS: 1.0000 mg | ORAL_TABLET | Freq: Two times a day (BID) | ORAL | 3 refills | Status: DC

## 2018-12-13 MED ORDER — TEMAZEPAM 30 MG PO CAPS: 30.0000 mg | ORAL_CAPSULE | Freq: Every evening | ORAL | 2 refills | Status: DC | PRN

## 2018-12-13 NOTE — Progress Notes (Signed)
Recreation Therapy Notes  Date: 12.4.20 Time: 1000 Location: 500 Hall Dayroom   Group Topic: Communication, Team Building, Problem Solving  Goal Area(s) Addresses:  Patient will effectively work with peer towards shared goal.  Patient will identify skill used to make activity successful.  Patient will identify how skills used during activity can be used to reach post d/c goals.   Intervention: STEM Activity   Activity: [employer]. Patients were provided the following materials: 5 drinking straws, 5 rubber bands, 5 paper clips, 2 index cards and 2 drinking cups. Using the provided materials patients were asked to build a launching mechanisms to launch a ping pong ball approximately 12 feet. Patients were divided into teams of 3-5.   Education: Education officer, community, Dentist.   Education Outcome: Acknowledges education/In group clarification offered/Needs additional education.   Clinical Observations/Feedback: Pt did not attend group.    Wesley Peterson, LRT/CTRS         Wesley Peterson A 12/13/2018 11:52 AM

## 2018-12-13 NOTE — Plan of Care (Signed)
Pt did not attend recreation therapy group sessions.   Erica Osuna, LRT/CTRS 

## 2018-12-13 NOTE — Plan of Care (Signed)
Discharge note  Patient verbalizes readiness for discharge. Follow up plan explained, AVS, Transition record and SRA given. Prescriptions and teaching provided. Belongings returned and signed for. Suicide safety plan completed and signed. Patient verbalizes understanding. Patient denies SI/HI and assures this Clinical research associate they will seek assistance should that change. Patient discharged to lobby where mother was waiting.  Problem: Activity: Goal: Will identify at least one activity in which they can participate Outcome: Adequate for Discharge   Problem: Coping: Goal: Ability to identify and develop effective coping behavior will improve Outcome: Adequate for Discharge Goal: Ability to interact with others will improve Outcome: Adequate for Discharge Goal: Demonstration of participation in decision-making regarding own care will improve Outcome: Adequate for Discharge Goal: Ability to use eye contact when communicating with others will improve Outcome: Adequate for Discharge   Problem: Health Behavior/Discharge Planning: Goal: Identification of resources available to assist in meeting health care needs will improve Outcome: Adequate for Discharge   Problem: Self-Concept: Goal: Will verbalize positive feelings about self Outcome: Adequate for Discharge   Problem: Education: Goal: Knowledge of [place] General Education information/materials will improve Outcome: Adequate for Discharge Goal: Emotional status will improve Outcome: Adequate for Discharge Goal: Mental status will improve Outcome: Adequate for Discharge Goal: Verbalization of understanding the information provided will improve Outcome: Adequate for Discharge   Problem: Activity: Goal: Interest or engagement in activities will improve Outcome: Adequate for Discharge Goal: Sleeping patterns will improve Outcome: Adequate for Discharge   Problem: Coping: Goal: Ability to verbalize frustrations and anger appropriately  will improve Outcome: Adequate for Discharge Goal: Ability to demonstrate self-control will improve Outcome: Adequate for Discharge   Problem: Health Behavior/Discharge Planning: Goal: Identification of resources available to assist in meeting health care needs will improve Outcome: Adequate for Discharge Goal: Compliance with treatment plan for underlying cause of condition will improve Outcome: Adequate for Discharge   Problem: Physical Regulation: Goal: Ability to maintain clinical measurements within normal limits will improve Outcome: Adequate for Discharge   Problem: Safety: Goal: Periods of time without injury will increase Outcome: Adequate for Discharge   Problem: Education: Goal: Ability to verbalize precipitating factors for violent behavior will improve Outcome: Adequate for Discharge   Problem: Coping: Goal: Ability to verbalize frustrations and anger appropriately will improve Outcome: Adequate for Discharge   Problem: Health Behavior/Discharge Planning: Goal: Ability to implement measures to prevent violent behavior in the future will improve Outcome: Adequate for Discharge   Problem: Safety: Goal: Ability to demonstrate self-control will improve Outcome: Adequate for Discharge Goal: Ability to redirect hostility and anger into socially appropriate behaviors will improve Outcome: Adequate for Discharge   Problem: Activity: Goal: Will verbalize the importance of balancing activity with adequate rest periods Outcome: Adequate for Discharge   Problem: Education: Goal: Will be free of psychotic symptoms Outcome: Adequate for Discharge Goal: Knowledge of the prescribed therapeutic regimen will improve Outcome: Adequate for Discharge   Problem: Coping: Goal: Coping ability will improve Outcome: Adequate for Discharge Goal: Will verbalize feelings Outcome: Adequate for Discharge   Problem: Health Behavior/Discharge Planning: Goal: Compliance with  prescribed medication regimen will improve Outcome: Adequate for Discharge   Problem: Nutritional: Goal: Ability to achieve adequate nutritional intake will improve Outcome: Adequate for Discharge   Problem: Role Relationship: Goal: Ability to communicate needs accurately will improve Outcome: Adequate for Discharge Goal: Ability to interact with others will improve Outcome: Adequate for Discharge   Problem: Safety: Goal: Ability to redirect hostility and anger into socially appropriate behaviors  will improve Outcome: Adequate for Discharge Goal: Ability to remain free from injury will improve Outcome: Adequate for Discharge   Problem: Self-Care: Goal: Ability to participate in self-care as condition permits will improve Outcome: Adequate for Discharge   Problem: Self-Concept: Goal: Will verbalize positive feelings about self Outcome: Adequate for Discharge
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
## 2018-12-13 NOTE — Progress Notes (Signed)
  [facility] Adult Case Management Discharge Plan :  Will you be returning to the same living situation after discharge:  Yes,  home. At discharge, do you have transportation home?: Yes,  Lyft at 11am. Do you have the ability to pay for your medications: No. Provided samples and referred to [facility].  Release of information consent forms completed and in the chart.  Patient to Follow up at: Follow-up Information    [facility]. Go on 12/17/2018.   Why: Please follow up with [facility] on Tuesday, December 8th at 8:30am. Bring your hospital discharge paperwork, photo ID, and proof of insurance or income. Wear a mask! Contact information: [place] [address] [phone]           Next level of care provider has access to [place] and Suicide Prevention discussed: Yes,  with mother.  Have you used any form of tobacco in the last 30 days? (Cigarettes, Smokeless Tobacco, Cigars, and/or Pipes): No  Has patient been referred to the Quitline?: N/A patient is not a smoker  Patient has been referred for addiction treatment: Yes  Joellen Jersey, [place] 12/13/2018, 8:55 AM

## 2018-12-13 NOTE — [facility] Suicide Risk Assessment (Signed)
[facility] Discharge Suicide Risk Assessment   Principal Problem: Schizophrenia [facility]) Discharge Diagnoses: Principal Problem:   Schizophrenia ([place])   Total Time spent with patient: 45 minutes Musculoskeletal: Strength & Muscle Tone: within normal limits Gait & Station: normal Patient leans: N/A  Psychiatric Specialty Exam: Physical Exam  ROS  Blood pressure 126/78, pulse 88, temperature 98.6 F (37 C), temperature source Oral, resp. rate 18, height 6\' 1"  (1.854 m), weight 68 kg, SpO2 98 %.Body mass index is 19.79 kg/m.  General Appearance: Casual  Eye Contact:  Good  Speech:  Slow  Volume:  Decreased  Mood:  Dysphoric  Affect:  Blunt  Thought Process:  Coherent and Descriptions of Associations: Circumstantial  Orientation:  Full (Time, Place, and Person)  Thought Content: In general poverty of content  Suicidal Thoughts:  No  Homicidal Thoughts:  No  Memory:  Immediate;   Fair Recent;   Fair Remote;   Good  Judgement:  Good  Insight:  Fair  Psychomotor Activity:  Normal  Concentration:  Concentration: Fair and Attention Span: Fair  Recall:  [employer] of Knowledge:  Fair  Language:  Poor  Akathisia:  Negative  Handed:  Right  AIMS (if indicated):     Assets:  Leisure Time Physical Health Resilience Social Support  ADL's:  Intact  Cognition:  WNL  Sleep:  Number of Hours: 7.5     Mental Status Per Nursing Assessment::   On Admission:  Thoughts of violence towards others, Plan to harm others  Demographic Factors:  Male  Loss Factors: Decrease in vocational status  Historical Factors: Impulsivity  Risk Reduction Factors:   Religious beliefs about death  Continued Clinical Symptoms:  Previous Psychiatric Diagnoses and Treatments  Cognitive Features That Contribute To Risk:  Loss of executive function    Suicide Risk:  Minimal: No identifiable suicidal ideation.  Patients presenting with no risk factors but with morbid ruminations; may be classified as  minimal risk based on the severity of the depressive symptoms  Follow-up Information    [facility] Follow up on 12/17/2018.   Why: Please follow up with [facility] on Tuesday, December 8th at 8:30am.  Contact information: [place] [address] [phone]           Plan Of Care/Follow-up recommendations:  Activity:  full  Serin Thornell, MD 12/13/2018, 7:42 AM

## 2018-12-13 NOTE — Progress Notes (Signed)
12/13/2018  13:00-14:10   Spirituality group focused on topic of Hope.   Pt's engaged with one another in discussion of "HOPE IS," compiling a collective word cloud.  Engaged in visual explorer activity to connect to "hope for today."     Therapeutic Modalities: MI, Narrative  Pt Summary:  Present throughout group.  Engaged in group discussion and activity at facilitator prompting.  Noted his practice of music as hopeful and stated that the photographs reminded him of good memories, which makes him feel hopeful.

## 2018-12-13 NOTE — Progress Notes (Addendum)
CSW met with patient at bedside to discuss discharge planning and transportation. Patient requested transportation assistance through [place] several times to return to his home address. CSW reminded patient that his mother has called and asked that he not use Lyft and that she would like to pick him up. Patient states he spoke with his mother last night and she did not object to him using Lyft.  Of note, although patient's mother is involved with his care, she is not his legal guardian.   Update 10:25am Mother called to discuss discharge plans. She stated that in order to return home, the patient MUST be picked up by her and had over his prescriptions. Mother states she is not able to pick up the patient until 6pm today.  CSW relayed this to patient. Patient states if he has to wait for his mother to pick him up, he won't go home and he will stay with a friend and find his own ride. CSW encouraged patient to call his mother, as she is worried and will want to know his discharge plans.  Lyft ride cancelled as patient has a ride home.  Stephanie Acre, MSW, [place] Social Worker [facility]  [phone]

## 2018-12-13 NOTE — Progress Notes (Signed)
Recreation Therapy Notes  INPATIENT RECREATION TR PLAN  Patient Details Name: Wesley Peterson MRN: [id number] DOB: 04/12/1985 Today's Date: 12/13/2018  Rec Therapy Plan Is patient appropriate for Therapeutic Recreation?: Yes Treatment times per week: about 3 days Estimated Length of Stay: 5-7 days TR Treatment/Interventions: Group participation (Comment)  Discharge Criteria Pt will be discharged from therapy if:: Discharged Treatment plan/goals/alternatives discussed and agreed upon by:: Patient/family  Discharge Summary Short term goals set: See patient care plan Short term goals met: Not met Reason goals not met: Pt did not attend group sessions. Therapeutic equipment acquired: N/A Reason patient discharged from therapy: Discharge from hospital Pt/family agrees with progress & goals achieved: Yes Date patient discharged from therapy: 12/13/18      , LRT/CTRS  ,  A 12/13/2018, 2:21 PM  

## 2018-12-17 ENCOUNTER — Encounter (HOSPITAL_COMMUNITY): Payer: Self-pay | Admitting: Emergency Medicine

## 2018-12-17 LAB — PROLACTIN: Prolactin: 23.4 ng/mL — ABNORMAL HIGH (ref 4.0–15.2)

## 2019-08-25 ENCOUNTER — Other Ambulatory Visit: Payer: Self-pay

## 2019-08-25 ENCOUNTER — Ambulatory Visit (HOSPITAL_COMMUNITY)
Admission: EM | Admit: 2019-08-25 | Discharge: 2019-08-26 | Disposition: A | Discharge status: Home / Self Care | Payer: No Payment, Other | Attending: Nurse Practitioner | Admitting: Nurse Practitioner

## 2019-08-25 DIAGNOSIS — F200 Paranoid schizophrenia: Secondary | ICD-10-CM | POA: Insufficient documentation

## 2019-08-25 DIAGNOSIS — Z560 Unemployment, unspecified: Secondary | ICD-10-CM | POA: Insufficient documentation

## 2019-08-25 DIAGNOSIS — Z20822 Contact with and (suspected) exposure to covid-19: Secondary | ICD-10-CM | POA: Diagnosis not present

## 2019-08-25 DIAGNOSIS — R456 Violent behavior: Secondary | ICD-10-CM | POA: Insufficient documentation

## 2019-08-25 DIAGNOSIS — Z79899 Other long term (current) drug therapy: Secondary | ICD-10-CM | POA: Insufficient documentation

## 2019-08-25 DIAGNOSIS — Z652 Problems related to release from prison: Secondary | ICD-10-CM | POA: Insufficient documentation

## 2019-08-25 DIAGNOSIS — Z9119 Patient's noncompliance with other medical treatment and regimen: Secondary | ICD-10-CM | POA: Diagnosis not present

## 2019-08-25 LAB — POCT URINE DRUG SCREEN - MANUAL ENTRY (I-SCREEN)
POC Amphetamine UR: POSITIVE — AB
POC Buprenorphine (BUP): NOT DETECTED
POC Cocaine UR: NOT DETECTED
POC Marijuana UR: NOT DETECTED
POC Methadone UR: NOT DETECTED
POC Methamphetamine UR: NOT DETECTED
POC Morphine: NOT DETECTED
POC Oxazepam (BZO): NOT DETECTED
POC Oxycodone UR: NOT DETECTED
POC Secobarbital (BAR): NOT DETECTED

## 2019-08-25 LAB — POC SARS CORONAVIRUS 2 AG: SARS Coronavirus 2 Ag: NEGATIVE

## 2019-08-25 MED ORDER — RISPERIDONE 3 MG PO TABS
3.0000 mg | ORAL_TABLET | Freq: Two times a day (BID) | ORAL | Status: DC
  Administered 2019-08-25 – 2019-08-26 (×2): 3 mg via ORAL
  Filled 2019-08-25: qty 14
  Filled 2019-08-25 (×2): qty 1

## 2019-08-25 MED ORDER — MAGNESIUM HYDROXIDE 400 MG/5ML PO SUSP: 30.0000 mL | Freq: Every day | ORAL | Status: DC | PRN

## 2019-08-25 MED ORDER — ALUM & MAG HYDROXIDE-SIMETH 200-200-20 MG/5ML PO SUSP: 30.0000 mL | ORAL | Status: DC | PRN

## 2019-08-25 MED ORDER — BENZTROPINE MESYLATE 1 MG PO TABS
1.0000 mg | ORAL_TABLET | Freq: Two times a day (BID) | ORAL | Status: DC
  Administered 2019-08-25 – 2019-08-26 (×2): 1 mg via ORAL
  Filled 2019-08-25: qty 14
  Filled 2019-08-25 (×2): qty 1

## 2019-08-25 MED ORDER — TEMAZEPAM 30 MG PO CAPS
30.0000 mg | ORAL_CAPSULE | Freq: Once | ORAL | Status: AC
  Administered 2019-08-25: 30 mg via ORAL
  Filled 2019-08-25: qty 1

## 2019-08-25 MED ORDER — ACETAMINOPHEN 325 MG PO TABS: 650.0000 mg | ORAL_TABLET | Freq: Four times a day (QID) | ORAL | Status: DC | PRN
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
## 2019-08-25 NOTE — ED Notes (Signed)
Meal given to patient 

## 2019-08-25 NOTE — ED Notes (Signed)
Patient is calm, comfortable and resting.

## 2019-08-25 NOTE — ED Notes (Signed)
Pt. is with NP

## 2019-08-26 ENCOUNTER — Encounter (HOSPITAL_COMMUNITY): Payer: Self-pay | Admitting: Emergency Medicine

## 2019-08-26 ENCOUNTER — Other Ambulatory Visit: Payer: Self-pay

## 2019-08-26 LAB — LIPID PANEL
Cholesterol: 137 mg/dL (ref 0–200)
HDL: 49 mg/dL (ref >40)
LDL Cholesterol: 69 mg/dL (ref 0–99)
Total CHOL/HDL Ratio: 2.8 RATIO
Triglycerides: 95 mg/dL (ref <150)
VLDL: 19 mg/dL (ref 0–40)

## 2019-08-26 LAB — TSH: TSH: 2.227 u[IU]/mL (ref 0.350–4.500)

## 2019-08-26 LAB — HEMOGLOBIN A1C
Hgb A1c MFr Bld: 5.9 % — ABNORMAL HIGH (ref 4.8–5.6)
Mean Plasma Glucose: 122.63 mg/dL

## 2019-08-26 LAB — CBC WITH DIFFERENTIAL/PLATELET
Abs Immature Granulocytes: 0.01 10*3/uL (ref 0.00–0.07)
Basophils Absolute: 0 10*3/uL (ref 0.0–0.1)
Basophils Relative: 1 %
Eosinophils Absolute: 0.1 10*3/uL (ref 0.0–0.5)
Eosinophils Relative: 2 %
HCT: 39.2 % (ref 39.0–52.0)
Hemoglobin: 12.9 g/dL — ABNORMAL LOW (ref 13.0–17.0)
Immature Granulocytes: 0 %
Lymphocytes Relative: 34 %
Lymphs Abs: 1.7 10*3/uL (ref 0.7–4.0)
MCH: 31.6 pg (ref 26.0–34.0)
MCHC: 32.9 g/dL (ref 30.0–36.0)
MCV: 96.1 fL (ref 80.0–100.0)
Monocytes Absolute: 0.6 10*3/uL (ref 0.1–1.0)
Monocytes Relative: 12 %
Neutro Abs: 2.7 10*3/uL (ref 1.7–7.7)
Neutrophils Relative %: 51 %
Platelets: 201 10*3/uL (ref 150–400)
RBC: 4.08 MIL/uL — ABNORMAL LOW (ref 4.22–5.81)
RDW: 12.6 % (ref 11.5–15.5)
WBC: 5.2 10*3/uL (ref 4.0–10.5)
nRBC: 0 % (ref 0.0–0.2)

## 2019-08-26 LAB — COMPREHENSIVE METABOLIC PANEL
ALT: 11 U/L (ref 0–44)
AST: 14 U/L — ABNORMAL LOW (ref 15–41)
Albumin: 4.5 g/dL (ref 3.5–5.0)
Alkaline Phosphatase: 36 U/L — ABNORMAL LOW (ref 38–126)
Anion gap: 11 (ref 5–15)
BUN: 11 mg/dL (ref 6–20)
CO2: 24 mmol/L (ref 22–32)
Calcium: 9.7 mg/dL (ref 8.9–10.3)
Chloride: 104 mmol/L (ref 98–111)
Creatinine, Ser: 0.9 mg/dL (ref 0.61–1.24)
GFR calc Af Amer: >60 mL/min (ref >60)
GFR calc non Af Amer: >60 mL/min (ref >60)
Glucose, Bld: 97 mg/dL (ref 70–99)
Potassium: 3.9 mmol/L (ref 3.5–5.1)
Sodium: 139 mmol/L (ref 135–145)
Total Bilirubin: 0.4 mg/dL (ref 0.3–1.2)
Total Protein: 7 g/dL (ref 6.5–8.1)

## 2019-08-26 LAB — SARS CORONAVIRUS 2 BY RT PCR (HOSPITAL ORDER, PERFORMED IN ~~LOC~~ HOSPITAL LAB): SARS Coronavirus 2: NEGATIVE

## 2019-08-26 MED ORDER — RISPERIDONE 3 MG PO TABS: 3.0000 mg | ORAL_TABLET | Freq: Two times a day (BID) | ORAL | 0 refills | Status: DC

## 2019-08-26 MED ORDER — BENZTROPINE MESYLATE 1 MG PO TABS: 1.0000 mg | ORAL_TABLET | Freq: Two times a day (BID) | ORAL | 0 refills | Status: DC

## 2019-08-26 NOTE — ED Notes (Signed)
Cereal and milk giving to patient.

## 2019-08-26 NOTE — ED Notes (Signed)
Patient is up walking back and forth in the unit

## 2019-08-26 NOTE — ED Notes (Signed)
Patient given breakfast; muffin, cereal

## 2019-08-26 NOTE — ED Triage Notes (Signed)
Pt presents for evaluation of taking out his anger on his mother.

## 2019-08-26 NOTE — ED Notes (Signed)
Pt A&O x 4, sleeping at present, presents with behavior issues as per his mother.  Pt taking his anger issues out on his mother.  Denies SI, HI or AVH. Pt calm & cooperative at present.  Monitoring for safety.

## 2019-08-26 NOTE — ED Notes (Signed)
Pt sleeping at present, no distress noted, monitoring for safety. 

## 2019-08-26 NOTE — ED Provider Notes (Signed)
Behavioral Health Admission H&P [facility](FBC & OBS)  Date: 08/26/19 Patient Name: Wesley Peterson MRN: [id number] Chief Complaint:  Chief Complaint  Patient presents with  . Aggressive Behavior   Chief Complaint/Presenting Problem: Pt was released today from jail after doing 3 months. Pt's mother has concerns about pt's prior behavior before jail. Pt's mother want doctor to look over pt's meds and adjust if needed  Diagnoses:  Final diagnoses:  Schizophrenia, paranoid (HCC)    HPI: Wesley Peterson is a 34 y.o. male who [facility] to [facility] with his mother. Patient reports that he was released from prison today. States that he was in prison for approximately 3 months for breaking windows out of his mother's car and a neighbors car. He states that while in prison he was taking Risperdal twice daily. He does not recall the dosage. States that prior to his incarceration he was also taking cogentin and adderall, states that he was not taking cogentin or adderall while incarcerated. States that he is not taking any other medications. [facility]11/24/2020 and again 11/24-11/25/2020. Patient received psychiatric care while living in [place]. He was also incarcerated for approximately for a year while in [place] due to assaulting an individual.   Patient is alert and oriented x 4. He is pleasant and cooperative. Speech is slow and decreased in volume. Responses are vague and short. He reports feeling depressed. His affect is flat. He denies auditory and visual hallucinations. He does not appear to responding to internal stimuli. No evidence of delusional thought content. He denies suicidal ideations. He denies homicidal ideations.  Patient's mother [name](Wanda Artis [phone]) states that she does not feel safe with the patient returning home. She states that as part of his release he was court ordered to participate in therapy. She states that she can bring the papers tomorrow. Discussed  with patient's mother that the patient did not meet inpatient criteria based on his current presentation. States that she has a younger child at home and due to the patient's history of impulsivity and aggression she does not feel safe with him living in her home. She is adamant that he be monitored and his medications adjusted if needed. Patient is in agreement with continuous assessment.   TTS Collateral: Pt's mother reported, pt was was released from jail today for busting out her and the neighbors windows in their vehicles. Pt's mother reported, the judge ordered pt to attend group therapy and see a psychiatrist. She stated, pt has a hx schizophrenia. Pt's mother reported [facility] has [facility]. However, pt has not attended, because he think work is more important. She mentioned, pt has hx not taking medication for 2 weeks out of the month. When he does not, she notice he has laughing outbursts and states others are stealing from him. Pt's mother reported, she cannot contract for pt's safety and  He cannot come back to her house until he gets stable. She stated, pt needs to hear from physician the importantance of taking his medication. In addition, to why he should attend group therapy and get help. Pt's mother stated, she wants to know if the pt medication could be evaluated.   PHQ 2-9:     [facility] ([facility]  C-SSRS RISK CATEGORY No Risk No Risk       Total Time spent with patient: 30 minutes  Musculoskeletal  Strength & Muscle Tone: within normal limits Gait & Station: normal Patient leans: N/A  Psychiatric Specialty Exam  Presentation General Appearance: Casual;Well Groomed  Eye Contact:Fair  Speech:Clear and Coherent;Normal Rate  Speech Volume:Decreased  Handedness:Right   Mood and Affect   Mood:Depressed;Anxious  Affect:Flat   Thought Process  Thought Processes:Coherent;Linear  Descriptions of Associations:Intact  Orientation:Full (Time, Place and Person)  Thought Content:Logical  Hallucinations:Hallucinations: None;Other (comment) (denies)  Ideas of Reference:None  Suicidal Thoughts:Suicidal Thoughts: No  Homicidal Thoughts:Homicidal Thoughts: No   Sensorium  Memory:Immediate Good;Recent Good;Remote Good  Judgment:Intact  Insight:Fair   Executive Functions  Concentration:Fair  Attention Span:Fair  Recall:Good  Fund of Knowledge:Good  Language:Good   Psychomotor Activity  Psychomotor Activity:Psychomotor Activity: Normal   Assets  Assets:Desire for Improvement;Communication Skills;Physical Health;Resilience   Sleep  Sleep:Sleep: Fair   Physical Exam Constitutional:      General: He is not in acute distress.    Appearance: He is not ill-appearing, toxic-appearing or diaphoretic.  HENT:     Head: Normocephalic.     Right Ear: External ear normal.     Left Ear: External ear normal.  Eyes:     Pupils: Pupils are equal, round, and reactive to light.  Cardiovascular:     Rate and Rhythm: Normal rate.  Pulmonary:     Effort: Pulmonary effort is normal. No respiratory distress.  Musculoskeletal:        General: Normal range of motion.  Skin:    General: Skin is warm and dry.  Neurological:     Mental Status: He is alert and oriented to person, place, and time.  Psychiatric:        Mood and Affect: Mood is anxious and depressed. Affect is flat.        Speech: Speech normal.        Behavior: Behavior is cooperative.        Thought Content: Thought content is not paranoid or delusional. Thought content does not include homicidal or suicidal ideation. Thought content does not include suicidal plan.    Review of Systems  Constitutional: Negative for chills, diaphoresis, fever, malaise/fatigue and weight loss.  HENT: Negative for  congestion.   Respiratory: Negative for cough and shortness of breath.   Cardiovascular: Negative for chest pain and palpitations.  Gastrointestinal: Negative for diarrhea, nausea and vomiting.  Neurological: Negative for dizziness and seizures.  Psychiatric/Behavioral: Positive for depression. Negative for hallucinations, memory loss, substance abuse and suicidal ideas. The patient is nervous/anxious. The patient does not have insomnia.   All other systems reviewed and are negative.   Blood pressure (!) 144/94, pulse (!) 111, temperature 99.1 F (37.3 C), temperature source Oral, SpO2 96 %. There is no height or weight on file to calculate BMI.  Past Psychiatric History: Schizophrenia. Inpatient at [facility] 11/18-11/24/2020 and 11/24-11/25/2020. Patient received psychiatric care while living in [place]. He was also incarcerated for approximately for a year while in [place] due to assaulting an individual.   Is the patient at risk to self? No  Has the patient been a risk to self in the past 6 months? No .    Has the patient been a risk to self within the distant past? No   Is the patient a risk to others? No   Has the patient been a risk to others in the past 6 months? Yes   Has the patient been a risk to others within the distant past? Yes   Past Medical History:  Past Medical History:  Diagnosis Date  . Schizophrenia (HCC)    No past surgical history on file.  Family History: No family history on file.  Social History:  Social History   Socioeconomic History  . Marital status: Single    Spouse name: Not on file  . Number of children: 1  . Years of education: Not on file  . Highest education level: Not on file  Occupational History  . Occupation: Unemployed  Tobacco Use  . Smoking status: Never Smoker  . Smokeless tobacco: Never Used  Substance and Sexual Activity  . Alcohol use: Yes    Alcohol/week: 1.0 standard drink    Types: 1 Cans of beer per week    Comment:  occasional  . Drug use: Never  . Sexual activity: Never  Other Topics Concern  . Not on file  Social History Narrative   ** Merged History Encounter **       Pt lives with mother and other relatives.  Currently unemployed.  Receives outpatient psychiatry services through [facility].   Social Determinants of Health   Financial Resource Strain:   . Difficulty of Paying Living Expenses:   Food Insecurity:   . Worried About Programme researcher, broadcasting/film/video in the Last Year:   . Barista in the Last Year:   Transportation Needs:   . Freight forwarder (Medical):   Marland Kitchen Lack of Transportation (Non-Medical):   Physical Activity:   . Days of Exercise per Week:   . Minutes of Exercise per Session:   Stress:   . Feeling of Stress :   Social Connections:   . Frequency of Communication with Friends and Family:   . Frequency of Social Gatherings with Friends and Family:   . Attends Religious Services:   . Active Member of Clubs or Organizations:   . Attends Banker Meetings:   Marland Kitchen Marital Status:   Intimate Partner Violence:   . Fear of Current or Ex-Partner:   . Emotionally Abused:   Marland Kitchen Physically Abused:   . Sexually Abused:     SDOH:  SDOH Screenings   Alcohol Screen: Low Risk   . Last Alcohol Screening Score (AUDIT): 0  Depression (PHQ2-9):   . PHQ-2 Score:   Financial Resource Strain:   . Difficulty of Paying Living Expenses:   Food Insecurity:   . Worried About Programme researcher, broadcasting/film/video in the Last Year:   . [employer] of Food in the Last Year:   Housing:   . Last Housing Risk Score:   Physical Activity:   . Days of Exercise per Week:   . Minutes of Exercise per Session:   Social Connections:   . Frequency of Communication with Friends and Family:   . Frequency of Social Gatherings with Friends and Family:   . Attends Religious Services:   . Active Member of Clubs or Organizations:   . Attends Banker Meetings:   Marland Kitchen Marital Status:   Stress:   . Feeling  of Stress :   Tobacco Use: Low Risk   . Smoking Tobacco Use: Never Smoker  . Smokeless Tobacco Use: Never Used  Transportation Needs:   . Freight forwarder (Medical):   Marland Kitchen Lack of Transportation (Non-Medical):     Last Labs:  Admission on 08/25/2019  Component Date Value Ref Range Status  . POC Amphetamine UR 08/25/2019 Positive* None Detected Final  . POC Secobarbital (BAR) 08/25/2019 None Detected  None Detected Final  . POC Buprenorphine (BUP)  08/25/2019 None Detected  None Detected Final  . POC Oxazepam (BZO) 08/25/2019 None Detected  None Detected Final  . POC Cocaine UR 08/25/2019 None Detected  None Detected Final  . POC Methamphetamine UR 08/25/2019 None Detected  None Detected Final  . POC Morphine 08/25/2019 None Detected  None Detected Final  . POC Oxycodone UR 08/25/2019 None Detected  None Detected Final  . POC Methadone UR 08/25/2019 None Detected  None Detected Final  . POC Marijuana UR 08/25/2019 None Detected  None Detected Final  . SARS Coronavirus 2 Ag 08/25/2019 NEGATIVE  NEGATIVE Final   Comment: (NOTE) SARS-CoV-2 antigen NOT DETECTED.   Negative results are presumptive.  Negative results do not preclude SARS-CoV-2 infection and should not be used as the sole basis for treatment or other patient management decisions, including infection  control decisions, particularly in the presence of clinical signs and  symptoms consistent with COVID-19, or in those who have been in contact with the virus.  Negative results must be combined with clinical observations, patient history, and epidemiological information. The expected result is Negative.  Fact Sheet for Patients: https://sanders-williams.net/  Fact Sheet for Healthcare Providers: https://martinez.com/   This test is not yet approved or cleared by the [place] FDA and  has been authorized for detection and/or diagnosis of SARS-CoV-2 by FDA under an Emergency Use  Authorization (EUA).  This EUA will remain in effect (meaning this test can be used) for the duration of  the C                          OVID-19 declaration under Section 564(b)(1) of the Act, 21 U.S.C. section 360bbb-3(b)(1), unless the authorization is terminated or revoked sooner.      Allergies: Patient has no known allergies.  PTA Medications: (Not in a hospital admission)   Medical Decision Making  CMP WNLs RBCs slightly decreased 4.08, hemoglobin 12.9, CBC otherwise within normal limits. A1C 5.9 TSH 2.227  EKG-NSR, QTC 417 Urine drug screen positive for amphetamines  Risperdal 3 mg BID for Schizophrenia/Mood Stability Cogentin 1 mg BID for EPS prevention    Recommendations  Based on my evaluation the patient does not appear to have an emergency medical condition.  Patient will be placed in the continuous assessment area at [facility] for treatment and stabilization. He will be reevaluated on 08/26/2019. The treatment team will determine disposition at that time.     Jackelyn Poling, NP 08/26/19  12:42 AM

## 2019-08-26 NOTE — Discharge Instructions (Addendum)
Take medications as prescribed Go to resource provided for follow up appointment

## 2019-08-26 NOTE — ED Notes (Signed)
Pt discharged in no acute distress. Denies SI/HI/A/VH. Verbalized understanding of all discharge instructions. Safety  Maintained. Pt escorted to lobby to mother for transport to home.
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
## 2019-08-26 NOTE — ED Notes (Signed)
Patient is watching TV 

## 2019-08-26 NOTE — ED Notes (Signed)
Called pt's mother to arrange transportation to home. Pt's mother stated she would be at facility at 2p to pick up pt. Pt made aware.

## 2019-08-26 NOTE — ED Notes (Signed)
Pt sitting up in bed alert but guarded. Denies concerns at present. Denies SI/HI or A/VH when asked. Informed pt to notify staff with any needs or concerns. Safety maintained.

## 2019-08-26 NOTE — ED Notes (Signed)
Patient was giving a salad and a soda.

## 2019-08-26 NOTE — ED Notes (Signed)
Pt in shower. No acute distress noted. Safety  Maintained.

## 2020-08-13 ENCOUNTER — Other Ambulatory Visit: Payer: Self-pay

## 2020-08-13 ENCOUNTER — Ambulatory Visit (HOSPITAL_COMMUNITY)
Admission: EM | Admit: 2020-08-13 | Discharge: 2020-08-13 | Disposition: A | Discharge status: Home / Self Care | Payer: No Payment, Other

## 2020-08-13 DIAGNOSIS — Z8659 Personal history of other mental and behavioral disorders: Secondary | ICD-10-CM | POA: Insufficient documentation

## 2020-08-13 DIAGNOSIS — Z9114 Patient's other noncompliance with medication regimen: Secondary | ICD-10-CM | POA: Insufficient documentation

## 2020-08-13 DIAGNOSIS — R630 Anorexia: Secondary | ICD-10-CM | POA: Insufficient documentation

## 2020-08-13 NOTE — ED Provider Notes (Addendum)
Behavioral Health Urgent Care Medical Screening Exam  Patient Name: Wesley Peterson MRN: [id number] Date of Evaluation: 08/13/20 Chief Complaint:  Medication Noncompliance Diagnosis:  Final diagnoses:  Noncompliance with medication regimen    History of Present illness: Wesley Peterson is a 35 y.o. male w/ hx of schizophrenia presenting to [facility] with father due to medication noncompliance. Pt refilled medication 7/25 and has only taken medications 2-3 times in past 2 weeks. Pt states he does not notice any behavioral changes or changes in mood whether on or off medication. Pt stated "it just didn't feel like it helped with anything". Per father, pt mother had informed father that pt was behaving erratically: opening and closing drawers in the middle of night, messing up bedroom, and being more agitated. Pt father was concerned and brought pt to [facility]. Pt denies present SI/HI/AVH.  Pt father brought up pt's history of [place] and wanted this to be discussed with patient. Informed father and pt that medications adjustments such as these are  more appropriate with primary psychiatrist Dr. Geanie Cooley at [facility]. Pt confirmed he has appointment at [facility] next Thursday at 10:00 AM. This has been confirmed by this Clinical research associate with receptionist at [facility] as well.  Pt does not want LAI but is open to having discussion with primary psych provider. Pt agrees to take medications as prescribed. Counseled pt on the importance of medication adherence and risks involved. Pt did admit poor sleep and loss of appetite since medication nonadherence.   Psychiatric Specialty Exam  Presentation  General Appearance:Appropriate for Environment; Casual  Eye Contact:Poor  Speech:Clear and Coherent; Slow  Speech Volume:Normal  Handedness:Right   Mood and Affect  Mood:Depressed  Affect:Flat   Thought Process  Thought Processes:Coherent; Goal Directed  Descriptions of  Associations:Intact  Orientation:Full (Time, Place and Person)  Thought Content:Logical    Hallucinations:None  Ideas of Reference:None  Suicidal Thoughts:No  Homicidal Thoughts:No   Sensorium  Memory:Immediate Good; Recent Good; Remote Good  Judgment:Fair  Insight:Fair   Executive Functions  Concentration:Fair  Attention Span:Fair  Recall:Fair  Fund of Knowledge:Fair  Language:Fair   Psychomotor Activity  Psychomotor Activity:Normal   Assets  Assets:Communication Skills; Desire for Improvement; Housing; Transportation; Social Support   Sleep  Sleep:Fair  Number of hours:  No data recorded  No data recorded  Physical Exam: Physical Exam Vitals and nursing note reviewed.  Constitutional:      Appearance: He is well-developed.  HENT:     Head: Normocephalic and atraumatic.  Eyes:     Conjunctiva/sclera: Conjunctivae normal.  Cardiovascular:     Rate and Rhythm: Normal rate and regular rhythm.     Heart sounds: No murmur heard. Pulmonary:     Effort: Pulmonary effort is normal. No respiratory distress.     Breath sounds: Normal breath sounds.  Abdominal:     Palpations: Abdomen is soft.     Tenderness: There is no abdominal tenderness.  Musculoskeletal:     Cervical back: Neck supple.  Skin:    General: Skin is warm and dry.  Neurological:     Mental Status: He is alert.   Review of Systems  Constitutional:  Negative for chills and fever.  Respiratory:  Negative for cough, shortness of breath and wheezing.   Cardiovascular:  Negative for chest pain and palpitations.  Gastrointestinal:  Negative for abdominal pain, nausea and vomiting.  Skin:  Negative for itching and rash.  Neurological:  Negative for dizziness and headaches.  Blood pressure 139/86, pulse  75, temperature 98.5 F (36.9 C), temperature source Oral, resp. rate 16, SpO2 98 %. There is no height or weight on file to calculate BMI.  Musculoskeletal: Strength & Muscle Tone:  within normal limits Gait & Station: normal Patient leans: Front   [facility] MSE Discharge Disposition for Follow up and Recommendations: Based on my evaluation the patient does not appear to have an emergency medical condition and can be discharged with resources and follow up care in outpatient services for Medication Management  Pt has scheduled appointment with [facility] with Dr. Geanie Cooley next Thursday at 10:00 AM. Recommend LAI for patient to reduce likelihood of noncompliance.  Park Pope, MD 08/13/2020, 4:09 PM

## 2020-08-13 NOTE — Discharge Summary (Signed)
Jjesus Friedly to be D/C'd Home per MD order. Discussed with the patient and all questions fully answered. An After Visit Summary was printed and given to the patient. Patient escorted out and D/C home via private auto.  Dickie La  08/13/2020 4:44 PM

## 2020-08-13 NOTE — Discharge Instructions (Addendum)
Please take Risperdal, Adderall, and Cogentin as prescribed.  Please followup with [facility] on August 19, 2020 at 10 AM for your regular appointment to discuss long acting injectable.  Please also follow up with [facility] next Monday or Tuesday to sign a release form so your physician can see our note at [facility].
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
## 2020-12-28 ENCOUNTER — Other Ambulatory Visit: Payer: Self-pay

## 2020-12-28 ENCOUNTER — Emergency Department (HOSPITAL_COMMUNITY)
Admission: EM | Admit: 2020-12-28 | Discharge: 2020-12-30 | Disposition: A | Discharge status: Home / Self Care | Payer: Medicaid Other | Attending: Emergency Medicine | Admitting: Emergency Medicine

## 2020-12-28 ENCOUNTER — Encounter (HOSPITAL_COMMUNITY): Payer: Self-pay | Admitting: *Deleted

## 2020-12-28 DIAGNOSIS — F200 Paranoid schizophrenia: Secondary | ICD-10-CM | POA: Insufficient documentation

## 2020-12-28 DIAGNOSIS — Z8659 Personal history of other mental and behavioral disorders: Secondary | ICD-10-CM

## 2020-12-28 DIAGNOSIS — R440 Auditory hallucinations: Secondary | ICD-10-CM

## 2020-12-28 DIAGNOSIS — Z20822 Contact with and (suspected) exposure to covid-19: Secondary | ICD-10-CM | POA: Insufficient documentation

## 2020-12-28 DIAGNOSIS — R451 Restlessness and agitation: Secondary | ICD-10-CM

## 2020-12-28 MED ORDER — ZIPRASIDONE MESYLATE 20 MG IM SOLR
10.0000 mg | Freq: Once | INTRAMUSCULAR | Status: AC
  Administered 2020-12-28: 23:00:00 10 mg via INTRAMUSCULAR
  Filled 2020-12-28: qty 20

## 2020-12-28 NOTE — ED Notes (Signed)
Security has wanded pt in triage.  °

## 2020-12-28 NOTE — Progress Notes (Signed)
Inpatient Behavioral Health Placement  Pt meets inpatient criteria per [name], NP. There are no appropriate beds per [facility] Fransico Michael, RN.   Referral was sent to the following facilities;    Destination Service Provider Address Phone Fax  [facility]  [address], [place] [address] [phone]  [facility]  [address] [place] [address] [phone]  [facility]  [address] [place], [place] [address] [phone]  [facility]  [address] [place] [address] [phone]  [facility]  [address] [place], [place] [address] [phone]  [facility]  [address] [place] [address] [phone]  [facility]  [address] [place] [address] [phone]  [facility]  [address], [place] [address] [phone]  [facility]  [address], [place] [address] [phone]  [facility]  [address] [place] [address] [phone]  [facility]  [address], [place] [address] [phone]  [facility]  [address], [place] [address] [phone]  [facility]  [address], [place] [address] [phone]  [facility]  [address] [place] [address] [phone]   Situation ongoing,  CSW will follow up.   Maryjean Ka, MSW, [facility] 12/28/2020  @ 11:47 PM
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
## 2020-12-28 NOTE — ED Triage Notes (Signed)
Pt seen at [facility] and was released.  Pt arrived via [facility] , pt admits to seeing and hearing things.  Pt denies any ETOH or drug use. Pt denies any SI or HI.

## 2020-12-28 NOTE — ED Notes (Addendum)
Pt noted to have bottle of Adderall 10mg  tabs (7). Med verified with Maci, RN.

## 2020-12-28 NOTE — ED Provider Notes (Addendum)
[facility] EMERGENCY DEPARTMENT Provider Note   CSN: [id number] Arrival date & time: 12/28/20  2057     History Chief Complaint  Patient presents with   V70.1    Wesley Peterson is a 35 y.o. male.  Patient with history schizophrenia presents from EMS. Family had called due to agitated/threatening behavior. Patient was seen as outpatient at behavioral health today, as had not been taking meds, periods of restlessness and agitated behavior. Pt is very limited historian - level 5 caveat. Pt acknowledges non compliance w meds and 'hearing voices'. Denies thoughts of harm to self or others.   The history is provided by the patient, medical records and the EMS personnel. The history is limited by the condition of the patient.      Past Medical History:  Diagnosis Date   Schizophrenia [facility])     Patient Active Problem List   Diagnosis Date Noted   Schizophrenia (HCC) 12/04/2018   Schizophrenia, paranoid (HCC)    Psychosis (HCC) 11/27/2018    History reviewed. No pertinent surgical history.     History reviewed. No pertinent family history.  Social History   Tobacco Use   Smoking status: Never   Smokeless tobacco: Never  Substance Use Topics   Alcohol use: Yes    Alcohol/week: 1.0 standard drink    Types: 1 Cans of beer per week    Comment: occasional   Drug use: Never    Home Medications Prior to Admission medications   Medication Sig Start Date End Date Taking? Authorizing Provider  amphetamine-dextroamphetamine (ADDERALL) 5 MG tablet Take 5 mg by mouth every morning.    [provider]  benztropine (COGENTIN) 1 MG tablet Take 1 tablet (1 mg total) by mouth 2 (two) times daily. 08/26/19   Money, Gerlene Burdock, FNP  ibuprofen (ADVIL) 200 MG tablet Take 800 mg by mouth every 6 (six) hours as needed for fever, headache or mild pain.    [provider]  risperiDONE (RISPERDAL) 3 MG tablet Take 1 tablet (3 mg total) by mouth 2 (two) times daily. 08/26/19    Money, Gerlene Burdock, FNP    Allergies    Patient has no known allergies.  Review of Systems   Review of Systems  Constitutional:  Negative for fever.  HENT:  Negative for sore throat.   Eyes:  Negative for visual disturbance.  Respiratory:  Negative for shortness of breath.   Cardiovascular:  Negative for chest pain.  Gastrointestinal:  Negative for abdominal pain.  Genitourinary:  Negative for flank pain.  Musculoskeletal:  Negative for neck pain.  Skin:  Negative for rash.  Neurological:  Negative for headaches.  Hematological:  Does not bruise/bleed easily.  Psychiatric/Behavioral:  Positive for hallucinations. Negative for suicidal ideas.    Physical Exam Updated Vital Signs BP (!) 128/92 (BP Location: Right Arm)    Pulse 73    Temp 97.8 F (36.6 C) (Oral)    Resp 16    Ht 1.854 m (6\' 1" )    Wt 68 kg    SpO2 99%    BMI 19.79 kg/m   Physical Exam Vitals and nursing note reviewed.  Constitutional:      Appearance: Normal appearance. He is well-developed.  HENT:     Head: Atraumatic.     Nose: Nose normal.     Mouth/Throat:     Mouth: Mucous membranes are moist.     Pharynx: Oropharynx is clear.  Eyes:     General: No scleral icterus.  Conjunctiva/sclera: Conjunctivae normal.     Pupils: Pupils are equal, round, and reactive to light.  Neck:     Trachea: No tracheal deviation.  Cardiovascular:     Rate and Rhythm: Normal rate and regular rhythm.     Pulses: Normal pulses.     Heart sounds: Normal heart sounds. No murmur heard.   No friction rub. No gallop.  Pulmonary:     Effort: Pulmonary effort is normal. No accessory muscle usage or respiratory distress.     Breath sounds: Normal breath sounds.  Abdominal:     General: Bowel sounds are normal. There is no distension.     Palpations: Abdomen is soft.     Tenderness: There is no abdominal tenderness. There is no guarding.  Genitourinary:    Comments: No cva tenderness. Musculoskeletal:        General: No  swelling.     Cervical back: Normal range of motion and neck supple. No rigidity.  Skin:    General: Skin is warm and dry.     Findings: No rash.  Neurological:     Mental Status: He is alert.     Comments: Alert, speech clear. Motor/sens grossly intact bil. Steady gait.   Psychiatric:     Comments: Currently calm, alert. Appears mildly anxious. +indicates hearing voices, but does not appear to be responding to internal stimuli at this moment.  No SI/HI.     ED Results / Procedures / Treatments   Labs (all labs ordered are listed, but only abnormal results are displayed) Results for orders placed or performed during the hospital encounter of 08/25/19  SARS Coronavirus 2 by RT PCR (hospital order, performed in [place] hospital lab) Nasopharyngeal Nasopharyngeal Swab   Specimen: Nasopharyngeal Swab  Result Value Ref Range   SARS Coronavirus 2 NEGATIVE NEGATIVE  CBC with Differential  Result Value Ref Range   WBC 5.2 4.0 - 10.5 K/uL   RBC 4.08 (L) 4.22 - 5.81 MIL/uL   Hemoglobin 12.9 (L) 13.0 - 17.0 g/dL   HCT 39.2 39.0 - 52.0 %   MCV 96.1 80.0 - 100.0 fL   MCH 31.6 26.0 - 34.0 pg   MCHC 32.9 30.0 - 36.0 g/dL   RDW 12.6 11.5 - 15.5 %   Platelets 201 150 - 400 K/uL   nRBC 0.0 0.0 - 0.2 %   Neutrophils Relative % 51 %   Neutro Abs 2.7 1.7 - 7.7 K/uL   Lymphocytes Relative 34 %   Lymphs Abs 1.7 0.7 - 4.0 K/uL   Monocytes Relative 12 %   Monocytes Absolute 0.6 0.1 - 1.0 K/uL   Eosinophils Relative 2 %   Eosinophils Absolute 0.1 0.0 - 0.5 K/uL   Basophils Relative 1 %   Basophils Absolute 0.0 0.0 - 0.1 K/uL   Immature Granulocytes 0 %   Abs Immature Granulocytes 0.01 0.00 - 0.07 K/uL  Comprehensive metabolic panel  Result Value Ref Range   Sodium 139 135 - 145 mmol/L   Potassium 3.9 3.5 - 5.1 mmol/L   Chloride 104 98 - 111 mmol/L   CO2 24 22 - 32 mmol/L   Glucose, Bld 97 70 - 99 mg/dL   BUN 11 6 - 20 mg/dL   Creatinine, Ser 0.90 0.61 - 1.24 mg/dL   Calcium 9.7 8.9 -  10.3 mg/dL   Total Protein 7.0 6.5 - 8.1 g/dL   Albumin 4.5 3.5 - 5.0 g/dL   AST 14 (L) 15 - 41 U/L  ALT 11 0 - 44 U/L   Alkaline Phosphatase 36 (L) 38 - 126 U/L   Total Bilirubin 0.4 0.3 - 1.2 mg/dL   GFR calc non Af Amer >60 >60 mL/min   GFR calc Af Amer >60 >60 mL/min   Anion gap 11 5 - 15  TSH  Result Value Ref Range   TSH 2.227 0.350 - 4.500 uIU/mL  Lipid panel  Result Value Ref Range   Cholesterol 137 0 - 200 mg/dL   Triglycerides 95 <150 mg/dL   HDL 49 >40 mg/dL   Total CHOL/HDL Ratio 2.8 RATIO   VLDL 19 0 - 40 mg/dL   LDL Cholesterol 69 0 - 99 mg/dL  Hemoglobin A1c  Result Value Ref Range   Hgb A1c MFr Bld 5.9 (H) 4.8 - 5.6 %   Mean Plasma Glucose 122.63 mg/dL  POCT Urine Drug Screen - ([facility])  Result Value Ref Range   POC Amphetamine UR Positive (A) None Detected   POC Secobarbital (BAR) None Detected None Detected   POC Buprenorphine (BUP) None Detected None Detected   POC Oxazepam (BZO) None Detected None Detected   POC Cocaine UR None Detected None Detected   POC Methamphetamine UR None Detected None Detected   POC Morphine None Detected None Detected   POC Oxycodone UR None Detected None Detected   POC Methadone UR None Detected None Detected   POC Marijuana UR None Detected None Detected  POC SARS Coronavirus 2 Ag  Result Value Ref Range   SARS Coronavirus 2 Ag NEGATIVE NEGATIVE      EKG None  Radiology No results found.  Procedures Procedures   Medications Ordered in ED Medications - No data to display  ED Course  I have reviewed the triage vital signs and the nursing notes.  Pertinent labs & imaging results that were available during my care of the patient were reviewed by me and considered in my medical decision making (see chart for details).    MDM Rules/Calculators/A&P                         Pt with lab work from earlier today in computer - labs reviewed/interpreted by me - chem normal.   Reviewed nursing notes and prior charts  for additional history.  Recent outpatient bh notes reviewed.  Behavioral health team consulted - disposition per [facility] team.  The patient has been placed in psychiatric observation due to the need to provide a safe environment for the patient while obtaining psychiatric consultation and evaluation, as well as ongoing medical and medication management to treat the patient's condition.  The patient has not been placed under full IVC at this time.  Geodon 10 mg im for symptom improvement.   On review of charts, it is not clear what behavioral health meds/doses patient is supposed to be taking - as part of patients [place] evaluation, will ask [place] team to assess and make [place] medication recommendations.     Final Clinical Impression(s) / ED Diagnoses Final diagnoses:  None    Rx / DC Orders ED Discharge Orders     None           Lajean Saver, MD 12/28/20 2202

## 2020-12-28 NOTE — ED Notes (Signed)
Pt taken over to speak with TTS.
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
## 2020-12-29 DIAGNOSIS — Z8659 Personal history of other mental and behavioral disorders: Secondary | ICD-10-CM | POA: Insufficient documentation

## 2020-12-29 DIAGNOSIS — R440 Auditory hallucinations: Secondary | ICD-10-CM | POA: Insufficient documentation

## 2020-12-29 DIAGNOSIS — R451 Restlessness and agitation: Secondary | ICD-10-CM

## 2020-12-29 LAB — RESP PANEL BY RT-PCR (FLU A&B, COVID) ARPGX2
Influenza A by PCR: NEGATIVE
Influenza B by PCR: NEGATIVE
SARS Coronavirus 2 by RT PCR: NEGATIVE

## 2020-12-29 MED ORDER — TRAZODONE HCL 50 MG PO TABS: 50.0000 mg | ORAL_TABLET | Freq: Every evening | ORAL | Status: DC | PRN

## 2020-12-29 MED ORDER — DOCUSATE SODIUM 100 MG PO CAPS
100.0000 mg | ORAL_CAPSULE | Freq: Every day | ORAL | Status: DC
  Administered 2020-12-30: 07:00:00 100 mg via ORAL
  Filled 2020-12-29: qty 1

## 2020-12-29 MED ORDER — AMPHETAMINE-DEXTROAMPHETAMINE 10 MG PO TABS
5.0000 mg | ORAL_TABLET | ORAL | Status: DC
  Administered 2020-12-29 – 2020-12-30 (×2): 5 mg via ORAL
  Filled 2020-12-29 (×2): qty 1

## 2020-12-29 MED ORDER — BENZTROPINE MESYLATE 1 MG PO TABS
1.0000 mg | ORAL_TABLET | Freq: Two times a day (BID) | ORAL | Status: DC
  Administered 2020-12-29 (×2): 1 mg via ORAL
  Filled 2020-12-29 (×3): qty 1

## 2020-12-29 MED ORDER — HYDROXYZINE HCL 25 MG PO TABS
25.0000 mg | ORAL_TABLET | Freq: Three times a day (TID) | ORAL | Status: DC | PRN
  Filled 2020-12-29: qty 1

## 2020-12-29 MED ORDER — IBUPROFEN 800 MG PO TABS
800.0000 mg | ORAL_TABLET | Freq: Four times a day (QID) | ORAL | Status: DC | PRN
  Administered 2020-12-30: 08:00:00 800 mg via ORAL
  Filled 2020-12-29: qty 1

## 2020-12-29 MED ORDER — RISPERIDONE 1 MG PO TABS
3.0000 mg | ORAL_TABLET | Freq: Two times a day (BID) | ORAL | Status: DC
  Administered 2020-12-29 – 2020-12-30 (×3): 3 mg via ORAL
  Filled 2020-12-29 (×3): qty 3
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
## 2020-12-29 NOTE — ED Provider Notes (Signed)
Emergency Medicine Observation Re-evaluation Note  Wesley Peterson is a 35 y.o. male, seen on rounds today.  Pt initially presented to the ED for complaints of V70.1 and Psychiatric Evaluation Currently, the patient is sleeping.  Physical Exam  BP (!) 128/92 (BP Location: Right Arm)    Pulse (!) 33    Temp 98 F (36.7 C)    Resp 16    Ht 6\' 1"  (1.854 m)    Wt 68 kg    SpO2 100%    BMI 19.79 kg/m  Physical Exam General: No distress, awakens easily Cardiac: Regular rate and rhythm Lungs: No increased work of breathing Psych: Calm  ED Course / MDM  EKG:   I have reviewed the labs performed to date as well as medications administered while in observation.  Recent changes in the last 24 hours include none.  Plan  Current plan is for behavioral health assistance with placement.  Wesley Peterson is not under involuntary commitment.     Felipa Emory, MD 12/29/20 [phone]

## 2020-12-30 NOTE — ED Notes (Signed)
I have called   [facility]  five times today said they was sending to pick up pt never have picked up . Then I started Calling night shift transport  I have left 4 messages still know call back

## 2020-12-30 NOTE — ED Notes (Signed)
Attempted to call report x1. [facility] stated that transportation needed to be in route before they would accept report.

## 2020-12-30 NOTE — Progress Notes (Signed)
Patient has been denied by [facility] due to no appropriate beds available. Patient meets BH inpatient criteria per [name], NP. Patient has been faxed out to the following facilities:    [facility]  [address], [place] [address] [phone] --  [facility]  [address] [place] [address] [phone] --  [facility]  [address] [place], [place] [address] [phone] --  [facility]  [address], [place] [address] [phone] --  [facility]  [address] [place], [place] [address] [phone] --  [facility]  [address] [place] [address] [phone] --  [facility]  [address] [place] [address] [phone] --  [facility]  [address], [place] [address] [phone] --  [facility]  [address] [place] [address] [phone] --  [facility]  [address] [place] [address] [phone] --  [facility]  [address] [place] [address] [phone] --  [facility]  [address], [place] [address] [phone] --  [facility]  [address] [place] [address] [phone] --  [facility]  [address], [place] [address] [phone] --   Damita Dunnings, MSW, LCSW-A  12:21 PM 12/30/2020

## 2020-12-30 NOTE — BH Assessment (Signed)
Pt was re-assessed today via tele-assessment. Pt was calm but almost completely non-communicative. Pt answered all questions with the nod of his head for yes or no. Pt denied SI, HI and AVH by nodding no. Pt nodded yes when asked if he had slept and eaten. Pt would give no further information and turned away from the camera when continually asked.   Pt was calm and largely cooperative. He seemed alert but could not tell if he was completely oriented due to limited responses from him.   Per Hillery Jacks NP, pt is continued to be recommended for IP psych tx.   Laquenta Whitsell T. Jimmye Norman, MS, [facility], [facility] Triage Specialist [facility]

## 2020-12-30 NOTE — Progress Notes (Signed)
Pt accepted to [facility]   Patient meets inpatient criteria per [name], NP   The attending provider will be Treasa School, MD  Call report to [phone]   Ofilia Neas, RN @ [facility] notified.     Pt scheduled  to arrive at [facility] TODAY ANYTIME.    Damita Dunnings, MSW, LCSW-A  12:47 PM 12/30/2020

## 2020-12-30 NOTE — ED Notes (Signed)
Meal tray given to pt.

## 2020-12-30 NOTE — ED Notes (Signed)
TTS cart placed at bedside. 

## 2020-12-30 NOTE — ED Notes (Signed)
Pt requesting additional adderal. Pt states he normally takes 10mg . EDP aware.

## 2021-01-28 ENCOUNTER — Encounter (HOSPITAL_COMMUNITY): Payer: Self-pay

## 2021-01-28 ENCOUNTER — Emergency Department (HOSPITAL_COMMUNITY)
Admission: EM | Admit: 2021-01-28 | Discharge: 2021-01-31 | Disposition: A | Discharge status: Home / Self Care | Payer: Medicaid Other | Attending: Emergency Medicine | Admitting: Emergency Medicine

## 2021-01-28 ENCOUNTER — Other Ambulatory Visit: Payer: Self-pay

## 2021-01-28 DIAGNOSIS — Z20822 Contact with and (suspected) exposure to covid-19: Secondary | ICD-10-CM | POA: Insufficient documentation

## 2021-01-28 DIAGNOSIS — Z79899 Other long term (current) drug therapy: Secondary | ICD-10-CM | POA: Insufficient documentation

## 2021-01-28 DIAGNOSIS — F29 Unspecified psychosis not due to a substance or known physiological condition: Secondary | ICD-10-CM | POA: Diagnosis present

## 2021-01-28 LAB — COMPREHENSIVE METABOLIC PANEL
ALT: 14 U/L (ref 0–44)
AST: 29 U/L (ref 15–41)
Albumin: 5 g/dL (ref 3.5–5.0)
Alkaline Phosphatase: 72 U/L (ref 38–126)
Anion gap: 6 (ref 5–15)
BUN: 9 mg/dL (ref 6–20)
CO2: 27 mmol/L (ref 22–32)
Calcium: 9.7 mg/dL (ref 8.9–10.3)
Chloride: 102 mmol/L (ref 98–111)
Creatinine, Ser: 0.81 mg/dL (ref 0.61–1.24)
GFR, Estimated: >60 mL/min (ref >60)
Glucose, Bld: 93 mg/dL (ref 70–99)
Potassium: 4.2 mmol/L (ref 3.5–5.1)
Sodium: 135 mmol/L (ref 135–145)
Total Bilirubin: 0.5 mg/dL (ref 0.3–1.2)
Total Protein: 8.5 g/dL — ABNORMAL HIGH (ref 6.5–8.1)

## 2021-01-28 LAB — RAPID URINE DRUG SCREEN, HOSP PERFORMED
Amphetamines: POSITIVE — AB
Barbiturates: NOT DETECTED
Benzodiazepines: NOT DETECTED
Cocaine: NOT DETECTED
Opiates: NOT DETECTED
Tetrahydrocannabinol: NOT DETECTED

## 2021-01-28 LAB — SALICYLATE LEVEL: Salicylate Lvl: <7 mg/dL — ABNORMAL LOW (ref 7.0–30.0)

## 2021-01-28 LAB — CBC
HCT: 47 % (ref 39.0–52.0)
Hemoglobin: 15.4 g/dL (ref 13.0–17.0)
MCH: 31.1 pg (ref 26.0–34.0)
MCHC: 32.8 g/dL (ref 30.0–36.0)
MCV: 94.9 fL (ref 80.0–100.0)
Platelets: 351 10*3/uL (ref 150–400)
RBC: 4.95 MIL/uL (ref 4.22–5.81)
RDW: 12.7 % (ref 11.5–15.5)
WBC: 7 10*3/uL (ref 4.0–10.5)
nRBC: 0 % (ref 0.0–0.2)

## 2021-01-28 LAB — ETHANOL: Alcohol, Ethyl (B): <10 mg/dL (ref <10)

## 2021-01-28 LAB — ACETAMINOPHEN LEVEL: Acetaminophen (Tylenol), Serum: <10 ug/mL — ABNORMAL LOW (ref 10–30)

## 2021-01-28 MED ORDER — ZOLPIDEM TARTRATE 5 MG PO TABS
10.0000 mg | ORAL_TABLET | Freq: Every evening | ORAL | Status: DC | PRN
  Administered 2021-01-28: 10 mg via ORAL
  Filled 2021-01-28: qty 2

## 2021-01-28 NOTE — ED Notes (Addendum)
Pts phone, credit cards, social security cared, money given to security. Prescription medication bottles labeled and given to pharmacy.

## 2021-01-28 NOTE — ED Provider Notes (Signed)
[facility] Provider Note   CSN: [id number] Arrival date & time: 01/28/21  1102     History  Chief Complaint  Patient presents with   Hallucinations    Wesley Peterson is a 36 y.o. male.  Patient presenting voluntarily.  Brought in by POV.  Patient states he has states he has not slept in 2 days.  Patient admits to seeing things and also tells me that he is hearing things.  He told his that he thinks his shots are worn off.  Denies any suicidal or homicidal ideation.  Past medical history is significant for schizophrenia and bipolar disorder.  Patient appears to be on Haldol.  Only.  In the past he was on Adderall Cogentin and respite all.  But it appears he is not taking those anymore.  Not clear whether he supposed to or not.  Past medical history significant just for schizophrenia.      Home Medications Prior to Admission medications   Medication Sig Start Date End Date Taking? Authorizing Provider  haloperidol decanoate (HALDOL DECANOATE) 100 MG/ML injection Inject into the muscle every 28 (twenty-eight) days.   Yes [provider]  amphetamine-dextroamphetamine (ADDERALL) 5 MG tablet Take 10 mg by mouth every morning. Patient not taking: Reported on 01/28/2021    [provider]  benztropine (COGENTIN) 1 MG tablet Take 1 tablet (1 mg total) by mouth 2 (two) times daily. Patient not taking: Reported on 01/28/2021 08/26/19   Money, Lowry Ram, FNP  ibuprofen (ADVIL) 200 MG tablet Take 800 mg by mouth every 6 (six) hours as needed for fever, headache or mild pain. Patient not taking: Reported on 01/28/2021    [provider]  risperiDONE (RISPERDAL) 3 MG tablet Take 1 tablet (3 mg total) by mouth 2 (two) times daily. Patient not taking: Reported on 01/28/2021 08/26/19   Money, Lowry Ram, FNP      Allergies    Patient has no known allergies.    Review of Systems   Review of Systems  Constitutional:  Negative for chills and fever.  HENT:   Negative for ear pain and sore throat.   Eyes:  Negative for pain and visual disturbance.  Respiratory:  Negative for cough and shortness of breath.   Cardiovascular:  Negative for chest pain and palpitations.  Gastrointestinal:  Negative for abdominal pain and vomiting.  Genitourinary:  Negative for dysuria and hematuria.  Musculoskeletal:  Negative for arthralgias and back pain.  Skin:  Negative for color change and rash.  Neurological:  Negative for seizures and syncope.  Psychiatric/Behavioral:  Positive for hallucinations and sleep disturbance. Negative for suicidal ideas.   All other systems reviewed and are negative.  Physical Exam Updated Vital Signs BP (!) 152/108 (BP Location: Left Arm)    Pulse 98    Temp 97.9 F (36.6 C) (Oral)    Resp (!) 22    Ht 1.854 m (6\' 1" )    Wt 68 kg    SpO2 100%    BMI 19.79 kg/m  Physical Exam Vitals and nursing note reviewed.  Constitutional:      General: He is not in acute distress.    Appearance: Normal appearance. He is well-developed.  HENT:     Head: Normocephalic and atraumatic.  Eyes:     Extraocular Movements: Extraocular movements intact.     Conjunctiva/sclera: Conjunctivae normal.     Pupils: Pupils are equal, round, and reactive to light.  Cardiovascular:     Rate and  Rhythm: Normal rate and regular rhythm.     Heart sounds: No murmur heard. Pulmonary:     Effort: Pulmonary effort is normal. No respiratory distress.     Breath sounds: Normal breath sounds.  Abdominal:     Palpations: Abdomen is soft.     Tenderness: There is no abdominal tenderness.  Musculoskeletal:        General: No swelling.     Cervical back: Normal range of motion and neck supple.  Skin:    General: Skin is warm and dry.     Capillary Refill: Capillary refill takes less than 2 seconds.  Neurological:     General: No focal deficit present.     Mental Status: He is alert.     Cranial Nerves: No cranial nerve deficit.     Sensory: No sensory  deficit.     Motor: No weakness.     Coordination: Coordination normal.     Comments: Patient cooperative.  Communicating fine.  Denies suicidal homicidal ideation.  States that he is having visual and auditory hallucinations.  Not sleeping well.  Psychiatric:        Mood and Affect: Mood normal.    ED Results / Procedures / Treatments   Labs (all labs ordered are listed, but only abnormal results are displayed) Labs Reviewed  COMPREHENSIVE METABOLIC PANEL - Abnormal; Notable for the following components:      Result Value   Total Protein 8.5 (*)    All other components within normal limits  SALICYLATE LEVEL - Abnormal; Notable for the following components:   Salicylate Lvl [id number] (*)    All other components within normal limits  ACETAMINOPHEN LEVEL - Abnormal; Notable for the following components:   Acetaminophen (Tylenol), Serum <10 (*)    All other components within normal limits  RAPID URINE DRUG SCREEN, HOSP PERFORMED - Abnormal; Notable for the following components:   Amphetamines POSITIVE (*)    All other components within normal limits  ETHANOL  CBC    EKG None  Radiology No results found.  Procedures Procedures    Medications Ordered in ED Medications - No data to display  ED Course/ Medical Decision Making/ A&P                           Medical Decision Making Amount and/or Complexity of Data Reviewed Labs: ordered.  Patient here voluntarily.  But will do IVC paperwork and have them hold it at the desk in case he changes his mind.  Patient is medically cleared.  Complete metabolic panel is normal.  Alcohol not elevated salicylate not elevated Tylenol level not elevated.  CBC normal no leukocytosis no anemia.  Urine drug screen is negative.  Patient seems to be having psychosis.  We have requested behavioral health to evaluate him.  Patient is medically cleared.   Final Clinical Impression(s) / ED Diagnoses Final diagnoses:  Psychosis, unspecified  psychosis type [facility])    Rx / DC Orders ED Discharge Orders     None         Fredia Sorrow, MD 01/28/21 1407

## 2021-01-28 NOTE — ED Notes (Signed)
Pt ambulated to restroom. Has been cooperative with staff, however continues to act confused and worried about what is going on around him. Seems to continue to have hallucinations. Pt given sprite, water, and crackers.

## 2021-01-28 NOTE — ED Notes (Signed)
Pt asked to speak to nurse. Pt has been cooperative and polite during stay. Pt is requesting something to help him sleep due to not having any rest for the last 2 days. Md will be made aware. Awaiting further orders.

## 2021-01-28 NOTE — ED Notes (Signed)
Pt states "I need to stay overnight, I haven't slept in 2 days." RN standing face to face with patient, pt states "I feel like you are behind me."

## 2021-01-28 NOTE — ED Triage Notes (Signed)
Pt presents to ED voluntarily. Pt admits to hearing and seeing things. Pt states "my dad thinks my shot is wearing off." Pt will not answer questions about SI/HI

## 2021-01-28 NOTE — ED Notes (Signed)
Pt is having visual and auditory hallucinations at this time. States there is a girl in his room "making ASMR videos and clicking noises". He is seen laying in his bed, laughing at an unseen source. Pt remains pleasant and polite to staff.

## 2021-01-28 NOTE — ED Notes (Signed)
Pt becoming increasingly agitated. States he has been up for 2 days and no one has helped him. Pt standing in doorway requesting to speak to provider. MD made aware. Awaiting evaluation and orders.

## 2021-01-29 LAB — RESP PANEL BY RT-PCR (FLU A&B, COVID) ARPGX2
Influenza A by PCR: NEGATIVE
Influenza B by PCR: NEGATIVE
SARS Coronavirus 2 by RT PCR: NEGATIVE

## 2021-01-29 MED ORDER — HALOPERIDOL DECANOATE 100 MG/ML IM SOLN
100.0000 mg | INTRAMUSCULAR | Status: DC
  Administered 2021-01-29: 100 mg via INTRAMUSCULAR
  Filled 2021-01-29: qty 1

## 2021-01-29 MED ORDER — ACETAMINOPHEN 325 MG PO TABS
650.0000 mg | ORAL_TABLET | Freq: Four times a day (QID) | ORAL | Status: DC | PRN
  Administered 2021-01-30: 650 mg via ORAL
  Filled 2021-01-29: qty 2

## 2021-01-29 MED ORDER — IBUPROFEN 400 MG PO TABS
400.0000 mg | ORAL_TABLET | Freq: Four times a day (QID) | ORAL | Status: DC | PRN
  Administered 2021-01-29 – 2021-01-30 (×2): 400 mg via ORAL
  Filled 2021-01-29 (×2): qty 1

## 2021-01-29 MED ORDER — OLANZAPINE 5 MG PO TBDP
10.0000 mg | ORAL_TABLET | Freq: Every day | ORAL | Status: DC
  Administered 2021-01-29 – 2021-01-31 (×3): 10 mg via ORAL
  Filled 2021-01-29 (×3): qty 2

## 2021-01-29 MED ORDER — TRAZODONE HCL 50 MG PO TABS: 50.0000 mg | ORAL_TABLET | Freq: Every evening | ORAL | Status: DC | PRN

## 2021-01-29 NOTE — BH Assessment (Signed)
TTS attempted assessment. Per Ginger, RN, patient was given sleep medication and is asleep, unable to arouse. TTS will attempt at later time.

## 2021-01-29 NOTE — BHH Counselor (Signed)
Per Maxie Barb, NP, patient is recommended for inpatient treatment

## 2021-01-29 NOTE — Progress Notes (Signed)
°   01/29/21 0858  [place] Triage Screening (Walk-ins at [facility] only)  What Is the Reason for Your Visit/Call Today? 36 year old patient with history of schizophrenis present to [place] stating he came to the hospital to get a Hadol shot. Patient report auditory hallucinations and denies visual hallucinations. Denied suicide / homicidal ideations. Patient witnessed talking / answering questions during assessment to someone in the room, no one was in the room. Patient last seen 22-month ago due to agitated/threatening behavior. Per chart review, "Brought in by POV.  Patient states he has states he has not slept in 2 days.  Patient admits to seeing things and also tells me that he is hearing things.  He told his that he thinks his shots are worn off. " Patient reports he's been seen at [facility] who recommended he came back to [facility] to get his Haldol shot.  How Long Has This Been Causing You Problems? <Week  Have You Recently Had Any Thoughts About Hurting Yourself? No  Are You Planning to Commit Suicide/Harm Yourself At This time? No  Have you Recently Had Thoughts About [place]? No  Are You Planning To Harm Someone At This Time? No  Are you currently experiencing any auditory, visual or other hallucinations? No  Have You Used Any Alcohol or Drugs in the Past 24 Hours? No  Do you have any current medical co-morbidities that require immediate attention? No  What Do You Feel Would Help You the Most Today? Treatment for Depression or other mood problem  If access to [facility] was not available, would you have sought care in the Emergency Department? No  Determination of Need Routine (7 days)  Options For Referral Medication Management

## 2021-01-29 NOTE — BH Assessment (Signed)
Per RN, Pt remains somnolent and unable to participate in tele-assessment.   Pamalee Leyden, [facility], [facility] Triage Specialist [phone]

## 2021-01-29 NOTE — ED Notes (Signed)
TTS in progress 

## 2021-01-29 NOTE — BHH Counselor (Signed)
TTS called patient's Wesley Peterson @ [phone] in an attempt determine when was the exact date he received his shot. Patient's mother only knew he was in the hospital from 12/30/2020-01/23/2021. NO additional information provided.

## 2021-01-29 NOTE — ED Notes (Signed)
Pt given phone call.  

## 2021-01-29 NOTE — ED Notes (Signed)
Telesitter [place]) called to report they have a visualization on Pt in room and are watching the Pt moving forward while Pt remains in [facility].

## 2021-01-29 NOTE — ED Notes (Signed)
Contacted by pt's mother who wants [place] to know that "the shot" that he was given in December was received when he was an inpt at [facility] in [place] and that if we needed any collateral information on pt; we can call pt's mother Ms. Artis @ [phone].

## 2021-01-29 NOTE — ED Provider Notes (Signed)
Emergency Medicine Observation Re-evaluation Note  Wesley Peterson is a 36 y.o. male, seen on rounds today.  Pt initially presented to the ED for complaints of Hallucinations Currently, the patient is resting.  Physical Exam  BP 111/83 (BP Location: Left Arm)    Pulse 82    Temp 98.1 F (36.7 C) (Oral)    Resp 16    Ht 1.854 m (6\' 1" )    Wt 68 kg    SpO2 100%    BMI 19.79 kg/m  Physical Exam General: Calm, resting Cardiac: Regular rate Lungs: Breathing easily Psych: Normal mood  ED Course / MDM  EKG:   I have reviewed the labs performed to date as well as medications administered while in observation.  Recent changes in the last 24 hours include patient is still awaiting TTS assessment.  Plan  Current plan is for TTS assessment.  Wesley Peterson is not under involuntary commitment.     Dorie Rank, MD 01/29/21 [phone]

## 2021-01-29 NOTE — ED Notes (Signed)
Pt's sitter informed me that he asked her "why am I seeing things".

## 2021-01-29 NOTE — ED Notes (Signed)
Pts mother called this RN for updates on Pt status. RN unable to communicate with Pts mother over the phone who presented with repetitive, slurred speech. Pts mother even reported that the Pt knows to call his father if anything changes and Pt is discharged.  Pt resting comfortably at this time.   Telesitter called and given report to [place].

## 2021-01-29 NOTE — ED Notes (Signed)
Pt requested to speak to the RN. I went in pts room where he stated "when am I going to get my shot, I get it once a month and I have not gotten it here" When I asked what shot he is referring to, he stated "Haldol". I asked if he was hearing or seeing things, and he stated "I am hearing things and seeing people doing sound effect noises". He states the auditory and visual hallucinations "starts to get worse when I am trying to go to sleep".

## 2021-01-29 NOTE — ED Notes (Signed)
Screen put in the room for pt TTS

## 2021-01-29 NOTE — ED Notes (Signed)
Pt to appropriate bathroom to shower

## 2021-01-30 LAB — LIPID PANEL
Cholesterol: 131 mg/dL (ref 0–200)
HDL: 30 mg/dL — ABNORMAL LOW (ref >40)
LDL Cholesterol: 80 mg/dL (ref 0–99)
Total CHOL/HDL Ratio: 4.4 RATIO
Triglycerides: 104 mg/dL (ref <150)
VLDL: 21 mg/dL (ref 0–40)

## 2021-01-30 NOTE — Progress Notes (Signed)
CSW spoke with Tammy Sours with [facility] who is review this patient for possible placement. A medical clearance note was requested for further review. CSW faxed note stating the patient is medically clear from Dr. Vanetta Mulders on 01/28/2021 at 1:53PM.   Crissie Reese, MSW, LCSW-A, LCAS-A Phone: [phone] Disposition/TOC

## 2021-01-30 NOTE — ED Notes (Signed)
Pt ambulated to restroom without assistance.

## 2021-01-30 NOTE — Progress Notes (Signed)
Per Leroy Sea, patient meets criteria for inpatient treatment. There are no available or appropriate beds at [facility] today. CSW faxed referrals to the following facilities for review:  [facility]  Pending - No Request Sent N/A [address], [place] [address] [phone] --  [facility]  Pending - No Request Sent N/A [address] [place] [address] [phone] --  [facility]  Pending - No Request Sent N/A [address] [place] [address] [phone] --  [facility]  Pending - No Request Sent N/A [address] [place] [address] [phone] --  [facility]  Pending - No Request Sent N/A [address] [place], [place] [address] [phone] --  [facility]  Pending - No Request Sent N/A [address] [place] [address] [phone] --  [facility]  Pending - No Request Sent N/A [address] [place] [address] [phone] --  [facility]  Pending - No Request Sent N/A [address] [place] [address] [phone] --  [facility]  Pending - No Request Sent N/A [address] [place] [address] [phone] --  [facility]  Pending - No Request Sent N/A [address], [place] [address] [phone] --  [facility]  Pending - No Request Sent N/A [address] [place] [address] [phone] --  [facility]  Pending - No Request Sent N/A [name] [place] [address] [phone] --  [facility]  Pending - No Request Sent N/A [address], [place] [address] [phone] --  [facility]  Pending - No Request Sent N/A [address] [place], [place] [address] [phone] --  [facility]  Pending - No Request Sent N/A [address] [place] [address] [phone] --   TTS will continue to seek bed placement.  Crissie Reese, MSW, LCSW-A, LCAS-A Phone: [phone] Disposition/TOC

## 2021-01-30 NOTE — ED Provider Notes (Signed)
Emergency Medicine Observation Re-evaluation Note  Wesley Peterson is a 36 y.o. male, seen on rounds today.  Pt initially presented to the ED for complaints of Hallucinations and Schizophrenia (36 year old patient present to [facility] stating he came to the hospital to get a Hadol shot. Patient report auditory hallucinations and denies visual hallucinations. Denied suicide / homicidal ideations.  ) Currently, the patient is sleeping.  Physical Exam  BP 113/70 (BP Location: Left Arm)    Pulse 63    Temp 99.5 F (37.5 C) (Oral)    Resp 20    Ht 6\' 1"  (1.854 m)    Wt 68 kg    SpO2 99%    BMI 19.79 kg/m  Physical Exam General: nondistressed Cardiac: Extremities well perfused  Lungs: breathing is even and unlabored Psych: Deferred  ED Course / MDM  EKG:EKG Interpretation  Date/Time:  Saturday January 29 2021 14:14:37 EST Ventricular Rate:  69 PR Interval:  155 QRS Duration: 94 QT Interval:  368 QTC Calculation: 395 R Axis:   87 Text Interpretation: Sinus rhythm Consider left ventricular hypertrophy ST elevation suggests acute pericarditis No significant change since last tracing Confirmed by Dorie Rank [phone]) on 01/29/2021 2:28:40 PM  I have reviewed the labs performed to date as well as medications administered while in observation.  Recent changes in the last 24 hours include evaluated by TTS yesterday morning.  Plan is for inpatient treatment.  Plan  Current plan is for inpatient psychiatric treatment.  Wesley Peterson is not under involuntary commitment.     Godfrey Pick, MD 01/30/21 [phone]
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
## 2021-01-30 NOTE — ED Notes (Signed)
Patient resting in bed with eyes closed at this time.

## 2021-01-30 NOTE — ED Notes (Signed)
Update given to father at this time.

## 2021-01-31 DIAGNOSIS — F200 Paranoid schizophrenia: Secondary | ICD-10-CM

## 2021-01-31 MED ORDER — HALOPERIDOL DECANOATE 100 MG/ML IM SOLN: 100.0000 mg | INTRAMUSCULAR | 0 refills | Status: DC

## 2021-01-31 NOTE — Consult Note (Signed)
Telepsych Consultation   Reason for Consult:  Auditory and visual hallucinations Referring Physician:  Fredia Sorrow Location of Patient: [facility] emergency room Location of Provider: [facility]  Patient Identification: Wesley Peterson MRN:  [id number] Principal Diagnosis: Psychosis [facility]) Diagnosis:  Principal Problem:   Psychosis ([place])   Total Time spent with patient: 30 minutes  Subjective:   Wesley Peterson is a 36 y.o. male patient admitted with auditory and visual hallucinations. Patient has a history of schizophrenia. Patient was seen, chart reviewed and case discussed with Dr Dwyane Dee. Patient presented to the [facility] after he was told by the staff at [facility] to go to [facility] to get his Haldol Dec LAI injection. Patient received the injection in the emergency room on 01/29/21.  Patient stated he slept well last night. Apparently he had not slept in 2 days prior to coming to [place]. He reported a good appetite. Patient lives with his father who accompanied him to the emergency room. According to staff RN note on 1/21, patient received a Haldol Dec injection in December when he was seen at [facility].   He is alert/oriented x 4; calm/cooperative; and mood is congruent with affect.  He is speaking in a clear tone at moderate volume, and normal pace; with good eye contact.  His thought process is coherent and relevant. There is no indication that he is currently responding to internal/external stimuli or experiencing delusional thought content; and he has denied suicidal/self-harm/homicidal ideation, psychosis, and paranoia. Patient is psychiatrically clear.    This provider contacted [facility] office. Patient will be able to receive his monthly injection there once they receive proof of date injection was given at [facility]. Patient will need to bring injection with him to [facility]. [facility] requires patient's to receive injections on Thursday preceding the due date if that due date  falls on a weekend. MAR and facesheet faxed to [facility], [phone]. Patient's Haldol Decanoate prescription sent to [facility] in [place], [place]. Instructions were placed in discharge AVS. Patient's next injection is due on 02/24/2021.   Past Psychiatric History: Schizophrenia  Risk to Self:  No Risk to Others:  No Prior Inpatient Therapy:  Yes Prior Outpatient Therapy:  Unknown  Past Medical History:  Past Medical History:  Diagnosis Date   Schizophrenia ([place])    History reviewed. No pertinent surgical history. Family History: No family history on file. Family Psychiatric  History: Unknown Social History:  Social History   Substance and Sexual Activity  Alcohol Use Yes   Alcohol/week: 1.0 standard drink   Types: 1 Cans of beer per week   Comment: occasional     Social History   Substance and Sexual Activity  Drug Use Never    Social History   Socioeconomic History   Marital status: Single    Spouse name: Not on file   Number of children: 1   Years of education: Not on file   Highest education level: Not on file  Occupational History   Occupation: Unemployed  Tobacco Use   Smoking status: Never   Smokeless tobacco: Never  Substance and Sexual Activity   Alcohol use: Yes    Alcohol/week: 1.0 standard drink    Types: 1 Cans of beer per week    Comment: occasional   Drug use: Never   Sexual activity: Never  Other Topics Concern   Not on file  Social History Narrative   ** Merged History Encounter **       Pt lives with  mother and other relatives.  Currently unemployed.  [place] outpatient psychiatry services through [facility].   Social Determinants of Health   Financial Resource Strain: Not on file  Food Insecurity: Not on file  Transportation Needs: Not on file  Physical Activity: Not on file  Stress: Not on file  Social Connections: Not on file   Additional Social History:    Allergies:  No Known Allergies  Labs:  Results for orders placed  or performed during the hospital encounter of 01/28/21 (from the past 48 hour(s))  Resp Panel by RT-PCR (Flu A&B, Covid) Nasopharyngeal Swab     Status: None   Collection Time: 01/29/21  7:20 PM   Specimen: Nasopharyngeal Swab; Nasopharyngeal(NP) swabs in vial transport medium  Result Value Ref Range   SARS Coronavirus 2 by RT PCR NEGATIVE NEGATIVE    Comment: (NOTE) SARS-CoV-2 target nucleic acids are NOT DETECTED.  The SARS-CoV-2 RNA is generally detectable in upper respiratory specimens during the acute phase of infection. The lowest concentration of SARS-CoV-2 viral copies this assay can detect is 138 copies/mL. A negative result does not preclude SARS-Cov-2 infection and should not be used as the sole basis for treatment or other patient management decisions. A negative result may occur with  improper specimen collection/handling, submission of specimen other than nasopharyngeal swab, presence of viral mutation(s) within the areas targeted by this assay, and inadequate number of viral copies(<138 copies/mL). A negative result must be combined with clinical observations, patient history, and epidemiological information. The expected result is Negative.  Fact Sheet for Patients:  EntrepreneurPulse.com.au  Fact Sheet for Healthcare Providers:  IncredibleEmployment.be  This test is no t yet approved or cleared by the [place] FDA and  has been authorized for detection and/or diagnosis of SARS-CoV-2 by FDA under an Emergency Use Authorization (EUA). This EUA will remain  in effect (meaning this test can be used) for the duration of the COVID-19 declaration under Section 564(b)(1) of the Act, 21 U.S.C.section 360bbb-3(b)(1), unless the authorization is terminated  or revoked sooner.       Influenza A by PCR NEGATIVE NEGATIVE   Influenza B by PCR NEGATIVE NEGATIVE    Comment: (NOTE) The Xpert Xpress SARS-CoV-2/FLU/RSV plus assay is  intended as an aid in the diagnosis of influenza from Nasopharyngeal swab specimens and should not be used as a sole basis for treatment. Nasal washings and aspirates are unacceptable for Xpert Xpress SARS-CoV-2/FLU/RSV testing.  Fact Sheet for Patients: EntrepreneurPulse.com.au  Fact Sheet for Healthcare Providers: IncredibleEmployment.be  This test is not yet approved or cleared by the [place] FDA and has been authorized for detection and/or diagnosis of SARS-CoV-2 by FDA under an Emergency Use Authorization (EUA). This EUA will remain in effect (meaning this test can be used) for the duration of the COVID-19 declaration under Section 564(b)(1) of the Act, 21 U.S.C. section 360bbb-3(b)(1), unless the authorization is terminated or revoked.  Performed at [facility], [address] [place], [place] [address]   Lipid panel     Status: Abnormal   Collection Time: 01/30/21  6:56 AM  Result Value Ref Range   Cholesterol 131 0 - 200 mg/dL   Triglycerides 104 <150 mg/dL   HDL 30 (L) >40 mg/dL   Total CHOL/HDL Ratio 4.4 RATIO   VLDL 21 0 - 40 mg/dL   LDL Cholesterol 80 0 - 99 mg/dL    Comment:        Total Cholesterol/HDL:CHD Risk Coronary Heart Disease Risk Table  Men   Women  1/2 Average Risk   3.4   3.3  Average Risk       5.0   4.4  2 X Average Risk   9.6   7.1  3 X Average Risk  23.4   11.0        Use the calculated Patient Ratio above and the CHD Risk Table to determine the patient's CHD Risk.        ATP III CLASSIFICATION (LDL):  <100     mg/dL   Optimal  100-129  mg/dL   Near or Above                    Optimal  130-159  mg/dL   Borderline  160-189  mg/dL   High  >190     mg/dL   Very High Performed at [facility], [address] [place], [place] [address]     Medications:  Current Facility-Administered Medications  Medication Dose Route Frequency Provider Last Rate Last Admin   acetaminophen  (TYLENOL) tablet 650 mg  650 mg Oral Q6H PRN Dorie Rank, MD   650 mg at 01/30/21 0941   haloperidol decanoate (HALDOL DECANOATE) 100 MG/ML injection 100 mg  100 mg Intramuscular Q28 days Leevy-Johnson, Brooke A, NP   100 mg at 01/29/21 1504   ibuprofen (ADVIL) tablet 400 mg  400 mg Oral Q6H PRN Dorie Rank, MD   400 mg at 01/30/21 1927   traZODone (DESYREL) tablet 50 mg  50 mg Oral QHS PRN Leevy-Johnson, Brooke A, NP       Current Outpatient Medications  Medication Sig Dispense Refill   amphetamine-dextroamphetamine (ADDERALL) 5 MG tablet Take 10 mg by mouth every morning. (Patient not taking: Reported on 01/28/2021)     benztropine (COGENTIN) 1 MG tablet Take 1 tablet (1 mg total) by mouth 2 (two) times daily. (Patient not taking: Reported on 01/28/2021) 60 tablet 0   [START ON 02/24/2021] haloperidol decanoate (HALDOL DECANOATE) 100 MG/ML injection Inject 1 mL (100 mg total) into the muscle every 28 (twenty-eight) days. 1 mL 0    Musculoskeletal: Strength & Muscle Tone: within normal limits Gait & Station: normal Patient leans: N/A          Psychiatric Specialty Exam:  Presentation  General Appearance: Appropriate for Environment  Eye Contact:Fair  Speech:Clear and Coherent  Speech Volume:Normal  Handedness:Right   Mood and Affect  Mood:Euthymic  Affect:Appropriate; Congruent   Thought Process  Thought Processes:Coherent  Descriptions of Associations:Intact  Orientation:Full (Time, Place and Person)  Thought Content:Logical  History of Schizophrenia/Schizoaffective disorder:Yes  Duration of Psychotic Symptoms:Less than six months  Hallucinations:Hallucinations: None (patient denies)  Ideas of Reference:None  Suicidal Thoughts:Suicidal Thoughts: No  Homicidal Thoughts:Homicidal Thoughts: No   Sensorium  Memory:Immediate Fair; Recent Fair; Remote Fair  Judgment:Fair  Insight:Fair   Executive Functions  Concentration:Fair  Attention  Span:Fair  [place]   Psychomotor Activity  Psychomotor Activity:Psychomotor Activity: Normal   Assets  Assets:Communication Skills; Housing; Resilience; Social Support; Desire for Improvement   Sleep  Sleep:Sleep: Fair  Physical Exam: Physical Exam Vitals and nursing note reviewed.  Constitutional:      Appearance: Normal appearance.  Pulmonary:     Effort: Pulmonary effort is normal.  Musculoskeletal:        General: Normal range of motion.     Cervical back: Normal range of motion.  Neurological:     General: No focal deficit present.  Mental Status: He is alert and oriented to person, place, and time.  Psychiatric:        Attention and Perception: Attention normal. He does not perceive auditory or visual hallucinations.        Mood and Affect: Mood and affect normal.        Speech: Speech normal.        Behavior: Behavior normal. Behavior is cooperative.        Thought Content: Thought content normal. Thought content is not paranoid or delusional. Thought content does not include homicidal or suicidal ideation. Thought content does not include homicidal or suicidal plan.        Cognition and Memory: Cognition normal.   Review of Systems  Constitutional:  Negative for fever.  HENT: Negative.  Negative for congestion and sore throat.   Respiratory: Negative.  Negative for cough and shortness of breath.   Cardiovascular: Negative.  Negative for chest pain.  Musculoskeletal: Negative.   Neurological: Negative.    Blood pressure 129/85, pulse (!) 101, temperature 98.4 F (36.9 C), resp. rate 16, height 6\' 1"  (1.854 m), weight 68 kg, SpO2 98 %. Body mass index is 19.79 kg/m.  Treatment Plan Summary: Daily contact with patient to assess and evaluate symptoms and progress in treatment, Medication management, and Plan Patient is psychiatrically clear to follow up in the community at [facility] for his monthly Haldol  Decanoate LAI injection.  Next injection due on 02/24/2021. Instructions placed in discharge AVS, prescription for injection sent to [facility] in [place], [place].   Disposition: No evidence of imminent risk to self or others at present.   Patient does not meet criteria for psychiatric inpatient admission. Supportive therapy provided about ongoing stressors. Discussed crisis plan, support from social network, calling 911, coming to the Emergency Department, and calling Suicide Hotline.  This service was provided via telemedicine using a 2-way, interactive audio and video technology.  Names of all persons participating in this telemedicine service and their role in this encounter. Name: Nakari Deleo Role: Patient  Name: Jinny Blossom Role: PMHNP-BC  Name: Hampton Abbot Role: Attending MD  Name:  Role:     Ethelene Hal, NP 01/31/2021 3:53 PM

## 2021-01-31 NOTE — ED Notes (Signed)
Security gave patient his belongings.

## 2021-01-31 NOTE — ED Notes (Signed)
Breakfast tray provided. 

## 2021-01-31 NOTE — ED Notes (Signed)
Pharmacy returned meds to pt.

## 2021-01-31 NOTE — Discharge Instructions (Signed)
A prescription for Haldol Decanoate 100 mg/ml IM long acting injectable has been sent to [facility] in [place]. Please pick this medication up before going to [facility] on February 24, 2021. The staff at [facility] will administer the injection. Your next injection is due on 02-24-2021. The pharmacy may be abvle to help you with discount coupons if you are not able to afford this medication.

## 2021-01-31 NOTE — ED Notes (Signed)
Patient ambulatory to restroom  ?

## 2021-01-31 NOTE — ED Notes (Signed)
Provided patient with ginger ale and crackers as requested

## 2021-02-02 ENCOUNTER — Inpatient Hospital Stay (HOSPITAL_COMMUNITY)
Admission: AD | Admit: 2021-02-02 | Discharge: 2021-02-21 | DRG: 885 | Disposition: A | Discharge status: Home / Self Care | Payer: Federal, State, Local not specified - Other | Source: Intra-hospital | Attending: Psychiatry | Admitting: Psychiatry

## 2021-02-02 ENCOUNTER — Other Ambulatory Visit: Payer: Self-pay

## 2021-02-02 ENCOUNTER — Encounter (HOSPITAL_COMMUNITY): Payer: Self-pay | Admitting: Psychiatry

## 2021-02-02 ENCOUNTER — Encounter (HOSPITAL_COMMUNITY): Payer: Self-pay | Admitting: Emergency Medicine

## 2021-02-02 ENCOUNTER — Emergency Department (HOSPITAL_COMMUNITY)
Admission: EM | Admit: 2021-02-02 | Discharge: 2021-02-02 | Disposition: A | Discharge status: Other Acute Inpatient Hospital | Payer: Federal, State, Local not specified - Other | Attending: Emergency Medicine | Admitting: Emergency Medicine

## 2021-02-02 DIAGNOSIS — Z23 Encounter for immunization: Secondary | ICD-10-CM

## 2021-02-02 DIAGNOSIS — F200 Paranoid schizophrenia: Secondary | ICD-10-CM | POA: Diagnosis not present

## 2021-02-02 DIAGNOSIS — G2401 Drug induced subacute dyskinesia: Secondary | ICD-10-CM

## 2021-02-02 DIAGNOSIS — G4710 Hypersomnia, unspecified: Secondary | ICD-10-CM | POA: Diagnosis present

## 2021-02-02 DIAGNOSIS — Z5900 Homelessness unspecified: Secondary | ICD-10-CM

## 2021-02-02 DIAGNOSIS — F1510 Other stimulant abuse, uncomplicated: Secondary | ICD-10-CM | POA: Diagnosis present

## 2021-02-02 DIAGNOSIS — R440 Auditory hallucinations: Secondary | ICD-10-CM | POA: Diagnosis present

## 2021-02-02 DIAGNOSIS — Z79899 Other long term (current) drug therapy: Secondary | ICD-10-CM | POA: Diagnosis not present

## 2021-02-02 DIAGNOSIS — T4395XA Adverse effect of unspecified psychotropic drug, initial encounter: Secondary | ICD-10-CM | POA: Diagnosis present

## 2021-02-02 DIAGNOSIS — F250 Schizoaffective disorder, bipolar type: Secondary | ICD-10-CM | POA: Diagnosis present

## 2021-02-02 DIAGNOSIS — R7303 Prediabetes: Secondary | ICD-10-CM | POA: Diagnosis present

## 2021-02-02 DIAGNOSIS — Z560 Unemployment, unspecified: Secondary | ICD-10-CM | POA: Diagnosis not present

## 2021-02-02 DIAGNOSIS — K5900 Constipation, unspecified: Secondary | ICD-10-CM | POA: Diagnosis present

## 2021-02-02 DIAGNOSIS — Z20822 Contact with and (suspected) exposure to covid-19: Secondary | ICD-10-CM | POA: Insufficient documentation

## 2021-02-02 HISTORY — DX: Paranoid schizophrenia: F20.0

## 2021-02-02 LAB — ETHANOL: Alcohol, Ethyl (B): <10 mg/dL (ref <10)

## 2021-02-02 LAB — RAPID URINE DRUG SCREEN, HOSP PERFORMED
Amphetamines: POSITIVE — AB
Barbiturates: NOT DETECTED
Benzodiazepines: NOT DETECTED
Cocaine: NOT DETECTED
Opiates: NOT DETECTED
Tetrahydrocannabinol: NOT DETECTED

## 2021-02-02 LAB — CBC WITH DIFFERENTIAL/PLATELET
Abs Immature Granulocytes: 0.01 10*3/uL (ref 0.00–0.07)
Basophils Absolute: 0 10*3/uL (ref 0.0–0.1)
Basophils Relative: 1 %
Eosinophils Absolute: 0.1 10*3/uL (ref 0.0–0.5)
Eosinophils Relative: 2 %
HCT: 44.1 % (ref 39.0–52.0)
Hemoglobin: 14.1 g/dL (ref 13.0–17.0)
Immature Granulocytes: 0 %
Lymphocytes Relative: 27 %
Lymphs Abs: 1.4 10*3/uL (ref 0.7–4.0)
MCH: 31 pg (ref 26.0–34.0)
MCHC: 32 g/dL (ref 30.0–36.0)
MCV: 96.9 fL (ref 80.0–100.0)
Monocytes Absolute: 0.5 10*3/uL (ref 0.1–1.0)
Monocytes Relative: 9 %
Neutro Abs: 3.1 10*3/uL (ref 1.7–7.7)
Neutrophils Relative %: 61 %
Platelets: 331 10*3/uL (ref 150–400)
RBC: 4.55 MIL/uL (ref 4.22–5.81)
RDW: 12.7 % (ref 11.5–15.5)
WBC: 5.2 10*3/uL (ref 4.0–10.5)
nRBC: 0 % (ref 0.0–0.2)

## 2021-02-02 LAB — BASIC METABOLIC PANEL
Anion gap: 10 (ref 5–15)
BUN: 9 mg/dL (ref 6–20)
CO2: 25 mmol/L (ref 22–32)
Calcium: 9.5 mg/dL (ref 8.9–10.3)
Chloride: 101 mmol/L (ref 98–111)
Creatinine, Ser: 0.92 mg/dL (ref 0.61–1.24)
GFR, Estimated: >60 mL/min (ref >60)
Glucose, Bld: 166 mg/dL — ABNORMAL HIGH (ref 70–99)
Potassium: 4.4 mmol/L (ref 3.5–5.1)
Sodium: 136 mmol/L (ref 135–145)

## 2021-02-02 LAB — RESP PANEL BY RT-PCR (FLU A&B, COVID) ARPGX2
Influenza A by PCR: NEGATIVE
Influenza B by PCR: NEGATIVE
SARS Coronavirus 2 by RT PCR: NEGATIVE

## 2021-02-02 LAB — TSH: TSH: 1.727 u[IU]/mL (ref 0.350–4.500)

## 2021-02-02 MED ORDER — HALOPERIDOL 5 MG PO TABS
5.0000 mg | ORAL_TABLET | Freq: Every day | ORAL | Status: DC
  Administered 2021-02-03 – 2021-02-06 (×4): 5 mg via ORAL
  Filled 2021-02-02 (×5): qty 1

## 2021-02-02 MED ORDER — BENZTROPINE MESYLATE 1 MG PO TABS: 1.0000 mg | ORAL_TABLET | Freq: Every day | ORAL | Status: DC

## 2021-02-02 MED ORDER — DIVALPROEX SODIUM 250 MG PO DR TAB
250.0000 mg | DELAYED_RELEASE_TABLET | Freq: Two times a day (BID) | ORAL | Status: DC
  Administered 2021-02-02 – 2021-02-08 (×12): 250 mg via ORAL
  Filled 2021-02-02 (×18): qty 1

## 2021-02-02 MED ORDER — HYDROXYZINE HCL 25 MG PO TABS
25.0000 mg | ORAL_TABLET | Freq: Three times a day (TID) | ORAL | Status: DC | PRN
  Administered 2021-02-02 – 2021-02-18 (×8): 25 mg via ORAL
  Filled 2021-02-02 (×2): qty 1
  Filled 2021-02-02: qty 10
  Filled 2021-02-02 (×7): qty 1

## 2021-02-02 MED ORDER — TRAZODONE HCL 50 MG PO TABS: 100.0000 mg | ORAL_TABLET | Freq: Every day | ORAL | Status: DC

## 2021-02-02 MED ORDER — ALUM & MAG HYDROXIDE-SIMETH 200-200-20 MG/5ML PO SUSP: 30.0000 mL | ORAL | Status: DC | PRN

## 2021-02-02 MED ORDER — ACETAMINOPHEN 325 MG PO TABS
650.0000 mg | ORAL_TABLET | Freq: Four times a day (QID) | ORAL | Status: DC | PRN
  Administered 2021-02-05 – 2021-02-18 (×11): 650 mg via ORAL
  Filled 2021-02-02 (×11): qty 2

## 2021-02-02 MED ORDER — HALOPERIDOL 5 MG PO TABS
5.0000 mg | ORAL_TABLET | Freq: Every day | ORAL | Status: DC
  Administered 2021-02-02: 12:00:00 5 mg via ORAL
  Filled 2021-02-02: qty 1

## 2021-02-02 MED ORDER — BENZTROPINE MESYLATE 1 MG PO TABS
1.0000 mg | ORAL_TABLET | Freq: Every day | ORAL | Status: DC
  Administered 2021-02-02 – 2021-02-07 (×6): 1 mg via ORAL
  Filled 2021-02-02 (×10): qty 1

## 2021-02-02 MED ORDER — TRAZODONE HCL 50 MG PO TABS
50.0000 mg | ORAL_TABLET | Freq: Every evening | ORAL | Status: DC | PRN
  Administered 2021-02-02 – 2021-02-07 (×6): 50 mg via ORAL
  Filled 2021-02-02 (×17): qty 1

## 2021-02-02 MED ORDER — MAGNESIUM HYDROXIDE 400 MG/5ML PO SUSP
30.0000 mL | Freq: Every day | ORAL | Status: DC | PRN
  Administered 2021-02-13: 30 mL via ORAL
  Filled 2021-02-02: qty 30

## 2021-02-02 MED ORDER — DIVALPROEX SODIUM 250 MG PO DR TAB
250.0000 mg | DELAYED_RELEASE_TABLET | Freq: Two times a day (BID) | ORAL | Status: DC
  Administered 2021-02-02: 12:00:00 250 mg via ORAL
  Filled 2021-02-02: qty 1
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
## 2021-02-02 NOTE — ED Notes (Signed)
TTS in process 

## 2021-02-02 NOTE — ED Notes (Signed)
Pt says he wishes all these people in his head will shut up that a woman keeps yelling at him. pt is giggling and looks like he is trying to hear what someone is saying. Pt asked if this sitter could tell that he's out of it right now? This sitter said "yes a little bit, what usually helps you when you feel like this is there anything I can do to help?" Pt replied that last time he thinks they gave him some trazodone or something. RN notified.

## 2021-02-02 NOTE — ED Triage Notes (Signed)
Pt was made IVC by father. Pt states he had an argument with father and ended up here. Pt denies any SI/HI ideations at this time. Father believes pt is a danger to himself and others.
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
## 2021-02-02 NOTE — Consult Note (Signed)
Telepsych Consultation   Reason for Consult:  auditory hallucinations Referring Physician:  Ross Marcusourtney Horton, MD Location of Patient: [facility] emergency room Location of Provider: [facility]  Patient Identification: Wesley Peterson Metts MRN:  [id number] Principal Diagnosis: Schizophrenia, paranoid (HCC) Diagnosis:  Principal Problem:   Schizophrenia, paranoid (HCC) Active Problems:   Hearing voices   Total Time spent with patient: 30 minutes  Subjective:   Wesley Peterson Poch is a 36 y.o. male patient admitted with IVC after he was acting bizarre at home and his father felt he was a danger to himself. Patient was seen, chart reviewed and case discussed with Dr Lucianne MussKumar. Patient was discharged from [facility] on 01/31/2021 after he received a Haldol Decanoate 100 mg IM injection for his auditory hallucinations, paranoia and schizophrenia. Patient lives with his father, step-mother and 38 year old step-brother. Patient was seen via telepsych today by this provider.He presented as paranoid and was thought blocking. Patient answered "yes, no or I don't know" to most assessment questions. Her stated he and his father were having an argument but is unable to say what they were arguing about. Patient was at [facility] in December for a similar presentation. [facility]. He apparently was put on Haldol Decanoate LAI during that admission. Patient is unable to tell this provider whether he was on PO Haldol during that admission or whether he was discharged with any PO Haldol as a bridge to his injection. Of note he is prescribed Adderall 5 mg twice daily by a doctor in [place], per PDMP lookup. His UDS is positive for amphetamines. Patient stated he has the Adderall for ADHD, however there is no diagnosis in his chart to support this. Patient maintains poor eye contact, is disheveled and his affect is dysphoric and flat.   Collateral from patient's father [name]  [phone]: Oswaldo DoneVincent stated "My son came [facility] after getting the shot and was a perfect gentleman. He spent time watching TV with the family and ate meals with Wesley Peterson. He was polite and we had no problems. He has been living with me for 6 months now. His mother is in [place] and she has started helping him to try and get SSDI and Medicaid. He has a virtual appointment with SS in February. He started acting bizarre last Thursday and was outside wandering in the street. [facility] told [facility]. [facility] they [facility] shot because they had no record that he had a shot before. When he got home Monday he was still acting bizarre. I went to check on him when I got home and he would not talk to me or look at me. He did not come down to eat dinner. Later in the evening I heard the door slam and I heard him outside yelling and screaming, I thought he was on the phone but I am not sure he was. He sometimes talks to people not there. He came in the house and was cussing me and his step-mom and was acting like he was going to fight me. I called the police because he was out of control. They took him to the hospital and told me he could go voluntarily but if he refused I would need to go to the magistrate and IVC him. When the police patted him down they found  a knife in his pocket. I just want my son to get help, I can't have him living here and be like that." Patients father has requested to be updated with patients status if and when he is transferred.  .  Past Psychiatric History: Schizophrenia, paranoia, auditory hallucinations, past history of cannabis abuse.   Risk to Self:  Yes  Risk to Others:  Yes Prior Inpatient Therapy:  Yes Prior Outpatient Therapy:  Unknown  Past Medical History:  Past Medical History:  Diagnosis Date   Schizophrenia (HCC)    History reviewed. No pertinent surgical  history. Family History: No family history on file. Family Psychiatric  History: Unknown Social History:  Social History   Substance and Sexual Activity  Alcohol Use Yes   Alcohol/week: 1.0 standard drink   Types: 1 Cans of beer per week   Comment: occasional     Social History   Substance and Sexual Activity  Drug Use Never    Social History   Socioeconomic History   Marital status: Single    Spouse name: Not on file   Number of children: 1   Years of education: Not on file   Highest education level: Not on file  Occupational History   Occupation: Unemployed  Tobacco Use   Smoking status: Never   Smokeless tobacco: Never  Substance and Sexual Activity   Alcohol use: Yes    Alcohol/week: 1.0 standard drink    Types: 1 Cans of beer per week    Comment: occasional   Drug use: Never   Sexual activity: Never  Other Topics Concern   Not on file  Social History Narrative   ** Merged History Encounter **       Pt lives with mother and other relatives.  Currently unemployed.  Receives outpatient psychiatry services through [facility].   Social Determinants of Health   Financial Resource Strain: Not on file  Food Insecurity: Not on file  Transportation Needs: Not on file  Physical Activity: Not on file  Stress: Not on file  Social Connections: Not on file   Additional Social History:    Allergies:  No Known Allergies  Labs:  Results for orders placed or performed during the hospital encounter of 02/02/21 (from the past 48 hour(s))  CBC with Differential     Status: None   Collection Time: 02/02/21  1:49 AM  Result Value Ref Range   WBC 5.2 4.0 - 10.5 K/uL   RBC 4.55 4.22 - 5.81 MIL/uL   Hemoglobin 14.1 13.0 - 17.0 g/dL   HCT 69.6 78.9 - 38.1 %   MCV 96.9 80.0 - 100.0 fL   MCH 31.0 26.0 - 34.0 pg   MCHC 32.0 30.0 - 36.0 g/dL   RDW 01.7 51.0 - 25.8 %   Platelets 331 150 - 400 K/uL   nRBC 0.0 0.0 - 0.2 %   Neutrophils Relative % 61 %   Neutro Abs 3.1 1.7 -  7.7 K/uL   Lymphocytes Relative 27 %   Lymphs Abs 1.4 0.7 - 4.0 K/uL   Monocytes Relative 9 %   Monocytes Absolute 0.5 0.1 - 1.0 K/uL   Eosinophils Relative 2 %   Eosinophils Absolute 0.1 0.0 - 0.5 K/uL   Basophils Relative 1 %   Basophils Absolute 0.0 0.0 - 0.1 K/uL   Immature Granulocytes 0 %   Abs Immature Granulocytes 0.01 0.00 - 0.07 K/uL    Comment: Performed at [facility], [address], [place], [place] [address]  Basic metabolic panel     Status: Abnormal   Collection Time: 02/02/21  1:49 AM  Result Value Ref Range   Sodium 136 135 - 145 mmol/L   Potassium 4.4 3.5 - 5.1 mmol/L   Chloride 101 98 - 111 mmol/L   CO2 25 22 - 32 mmol/L   Glucose, Bld 166 (H) 70 - 99 mg/dL    Comment: Glucose reference range applies only to samples taken after fasting for at least 8 hours.   BUN 9 6 - 20 mg/dL   Creatinine, Ser 1.61 0.61 - 1.24 mg/dL   Calcium 9.5 8.9 - 09.6 mg/dL   GFR, Estimated >04 >54 mL/min    Comment: (NOTE) Calculated using the CKD-EPI Creatinine Equation (2021)    Anion gap 10 5 - 15    Comment: Performed at [facility], [address], [place], [place] [address]  Ethanol     Status: None   Collection Time: 02/02/21  1:49 AM  Result Value Ref Range   Alcohol, Ethyl (B) <10 <10 mg/dL    Comment: (NOTE) Lowest detectable limit for serum alcohol is 10 mg/dL.  For medical purposes only. Performed at [facility], [address], [place], [place] [address]   Rapid urine drug screen (hospital performed)     Status: Abnormal   Collection Time: 02/02/21  2:36 AM  Result Value Ref Range   Opiates NONE DETECTED NONE DETECTED   Cocaine NONE DETECTED NONE DETECTED   Benzodiazepines NONE DETECTED NONE DETECTED   Amphetamines POSITIVE (A) NONE DETECTED   Tetrahydrocannabinol NONE DETECTED NONE DETECTED   Barbiturates NONE DETECTED NONE DETECTED    Comment: (NOTE) DRUG SCREEN FOR MEDICAL PURPOSES ONLY.  IF CONFIRMATION IS NEEDED FOR ANY PURPOSE, NOTIFY LAB WITHIN 5  DAYS.  LOWEST DETECTABLE LIMITS FOR URINE DRUG SCREEN Drug Class                     Cutoff (ng/mL) Amphetamine and metabolites    1000 Barbiturate and metabolites    200 Benzodiazepine                 200 Tricyclics and metabolites     300 Opiates and metabolites        300 Cocaine and metabolites        300 THC                            50 Performed at [facility], [address], [place], [place] [address]     Medications:  Current Facility-Administered Medications  Medication Dose Route Frequency Provider Last Rate Last Admin   benztropine (COGENTIN) tablet 1 mg  1 mg Oral QHS Laveda Abbe, NP       divalproex (DEPAKOTE) DR tablet 250 mg  250 mg Oral Q12H Laveda Abbe, NP   250 mg at 02/02/21 1216   haloperidol (HALDOL) tablet 5 mg  5 mg Oral Q1200 Laveda Abbe, NP   5 mg at 02/02/21 1216   traZODone (DESYREL) tablet 100 mg  100 mg Oral QHS Laveda Abbe, NP       Current Outpatient Medications  Medication Sig Dispense Refill   [START ON 02/24/2021] haloperidol decanoate (HALDOL DECANOATE) 100 MG/ML injection Inject 1 mL (100 mg total) into the muscle every 28 (twenty-eight) days. 1 mL 0   amphetamine-dextroamphetamine (ADDERALL) 5 MG tablet Take 10 mg by mouth every morning. (Patient not taking: Reported  on 01/28/2021)     benztropine (COGENTIN) 1 MG tablet Take 1 tablet (1 mg total) by mouth 2 (two) times daily. (Patient not taking: Reported on 01/28/2021) 60 tablet 0    Musculoskeletal: Strength & Muscle Tone: within normal limits Gait & Station: normal Patient leans: N/A  Psychiatric Specialty Exam:  Presentation  General Appearance: Disheveled  Eye Contact:Poor  Speech:Blocked  Speech Volume:Decreased  Handedness:Right  Mood and Affect  Mood:Depressed; Dysphoric  Affect:Congruent; Depressed; Flat; Constricted  Thought Process  Thought Processes:Coherent  Descriptions of Associations:Intact  Orientation:Full (Time,  Place and Person)  Thought Content:Paranoid Ideation  History of Schizophrenia/Schizoaffective disorder:Yes  Duration of Psychotic Symptoms:Less than six months  Hallucinations:Hallucinations: Auditory Description of Auditory Hallucinations: "his/hers, they told me to fight with my father"  Ideas of Reference:Paranoia  Suicidal Thoughts:Suicidal Thoughts: No  Homicidal Thoughts:Homicidal Thoughts: No   Sensorium  Memory:Immediate Fair; Recent Fair; Remote Fair  Judgment:Fair  Insight:Poor   Executive Functions  Concentration:Fair  Attention Span:Fair  Recall:Fair  Fund of Knowledge:Fair  Language:Good  Psychomotor Activity  Psychomotor Activity:Psychomotor Activity: Normal   Assets  Assets:Communication Skills; Housing; Resilience; Social Support  Sleep  Sleep:Sleep: Poor (per patient)   Physical Exam: Physical Exam Vitals and nursing note reviewed.  HENT:     Head: Normocephalic.  Pulmonary:     Effort: Pulmonary effort is normal.  Musculoskeletal:        General: Normal range of motion.     Cervical back: Normal range of motion.  Neurological:     General: No focal deficit present.     Mental Status: He is alert and oriented to person, place, and time.  Psychiatric:        Attention and Perception: He perceives auditory hallucinations. He does not perceive visual hallucinations.        Mood and Affect: Mood is anxious and depressed. Affect is flat.        Speech: Speech normal.        Behavior: Behavior is slowed.        Thought Content: Thought content is paranoid. Thought content is not delusional. Thought content does not include homicidal or suicidal ideation. Thought content does not include homicidal or suicidal plan.   Review of Systems  Constitutional: Negative.  Negative for fever.  HENT: Negative.  Negative for congestion and sore throat.   Respiratory: Negative.  Negative for cough and shortness of breath.   Cardiovascular:  Negative.  Negative for chest pain.  Musculoskeletal: Negative.   Neurological: Negative.   Psychiatric/Behavioral:  Positive for depression and hallucinations. The patient is nervous/anxious and has insomnia.    Blood pressure 106/70, pulse 77, temperature 98.7 F (37.1 C), temperature source Oral, resp. rate 16, height 6\' 1"  (1.854 m), weight 68 kg, SpO2 98 %. Body mass index is 19.78 kg/m.  Treatment Plan Summary: Daily contact with patient to assess and evaluate symptoms and progress in treatment and Medication management  Patient received a Haldol Decanoate 100 mg LAI on 01/29/2021. His next injection is due in 28 days.   Today the following medications were added: Haldol 5 mg PO daily as a bridge to the Haldol Dec LAI Cogentin 1 mg at bedtime for EPS Trazodone 50 mg at bedtime for sleep.   Disposition: Recommend psychiatric Inpatient admission when medically cleared. Patient has been accepted to [facility] hall.   This service was provided via telemedicine using a 2-way, interactive audio and video technology.  Names of all persons participating in this telemedicine  service and their role in this encounter. Name: Wesley Peterson Caldeira  Role: Patient   Name: Elta GuadeloupeLaurie Vashaun Osmon  Role: PMHNP-BC  Name: Nelly RoutArchana Kumar  Role: Attending MD  Name: Patsey BertholdVincent Hakala  Role: Patients father    Laveda AbbeLaurie Britton Yale Golla, NP 02/02/2021 4:00 PM

## 2021-02-02 NOTE — BH Assessment (Addendum)
Comprehensive Clinical Assessment (CCA) Note  02/02/2021 Wesley Peterson [id number] Disposition: Clinician discussed patient care with Trinna Post, PA.  He recommends patient be observed and then seen by psychiatry on 01/25.  Clinician informed Dr. Dina Rich and RN Vivien Rota via secure messaging.   Wesley Peterson  is calm and cooperative during assessment.  He has good eye contact and is oriented x4.  He admits to hearing voices telling him non-sensical things.  Pt does not evidence any delusional thought process.  Pt was asked if he lived with father and stepmother and he ksaid "I don't know now."  Father did say patient could not return to the home.  That may explain patient saying he wanted to go to inpatient care.  Ptient has appointment for a Haldol shot on 02/26.   Chief Complaint:  Chief Complaint  Patient presents with   V70.1   Visit Diagnosis: Schizophrenia    CCA Screening, Triage and Referral (STR)  Patient Reported Information How did you hear about [place]? Legal System (Pt is on IVC.)  What Is the Reason for Your Visit/Call Today? Pt says that he and dad "got into a really bad argument."  Pt lives with his dad and stepmother.  Pt recently had his Haldol dec shot on 01/29/21.  He is due for next shot on 03/06/21 at [facility] in [place].   Pt says he also takes Adderal.  Pt says he still hears voices and voices say "he said, they, she told."  Pt denies any visual hallucinations.  Pt  denes any SI or HI.  Pt denies any recent use of ETOH or marijuana.  Pt denies any access to guns.  Pt says he does not sleep much.  Pt reports not eating much now due to "I don't digest my food well."  Pt says he thinks he needs a Social worker.  Pt was walking up to father in a threatening manner and cursing a lot.  Accusing father of moving his belongings.  Father said that patient cannot come back to his house and he was told that tonight.  How Long Has This Been Causing You Problems? 1 wk - 1 month  What Do You Feel  Would Help You the Most Today? Treatment for Depression or other mood problem   Have You Recently Had Any Thoughts About Hurting Yourself? No  Are You Planning to Commit Suicide/Harm Yourself At This time? No   Have you Recently Had Thoughts About [place]? No  Are You Planning to Harm Someone at This Time? No  Explanation: No data recorded  Have You Used Any Alcohol or Drugs in the Past 24 Hours? No  How Long Ago Did You Use Drugs or Alcohol? No data recorded What Did You Use and How Much? No data recorded  Do You Currently Have a Therapist/Psychiatrist? Yes  Name of Therapist/Psychiatrist: Pt has an Haldol DE shot on 02/16 at [facility] in [place].  He says he does not have a therapist but "that I need one."   Have You Been Recently Discharged From Any Office Practice or Programs? No  Explanation of Discharge From Practice/Program: No data recorded    CCA Screening Triage Referral Assessment Type of Contact: Tele-Assessment  Telemedicine Service Delivery:   Is this Initial or Reassessment? Initial Assessment  Date Telepsych consult ordered in [facility]:  02/02/21  Time Telepsych consult ordered in [facility]:  0120  Location of Assessment: [facility]  Provider Location: [facility] Assessment Services   Collateral Involvement: Romie Minus,  dad [phone]   Does Patient Have a Court Appointed Legal Guardian? No data recorded Name and Contact of Legal Guardian: No data recorded If Minor and Not Living with Parent(s), Who has Custody? No data recorded Is CPS involved or ever been involved? Never  Is APS involved or ever been involved? Never   Patient Determined To Be At Risk for Harm To Self or Others Based on Review of Patient Reported Information or Presenting Complaint? Yes, for Self-Harm  Method: No data recorded Availability of Means: No data recorded Intent: No data recorded Notification Required: No data recorded Additional Information for Danger to  Others Potential: No data recorded Additional Comments for Danger to Others Potential: No data recorded Are There Guns or Other Weapons in Your Home? No data recorded Types of Guns/Weapons: No data recorded Are These Weapons Safely Secured?                            No data recorded Who Could Verify You Are Able To Have These Secured: No data recorded Do You Have any Outstanding Charges, Pending Court Dates, Parole/Probation? No data recorded Contacted To Inform of Risk of Harm To Self or Others: No data recorded   Does Patient Present under Involuntary Commitment? Yes  IVC Papers Initial File Date: No data recorded  [place] of Residence: [place]   Patient Currently Receiving the Following Services: Medication Management   Determination of Need: Routine (7 days)   Options For Referral: Other: Comment (Observe overnight and psychiatry to see on 01/25)     CCA Biopsychosocial Patient Reported Schizophrenia/Schizoaffective Diagnosis in Past: Yes   Strengths: N/A   Mental Health Symptoms Depression:   Change in energy/activity   Duration of Depressive symptoms:    Mania:   Change in energy/activity; Racing thoughts   Anxiety:    Difficulty concentrating   Psychosis:   Hallucinations (hearing voices)   Duration of Psychotic symptoms:    Trauma:   Irritability/anger   Obsessions:   None   Compulsions:   "Driven" to perform behaviors/acts; Poor Insight; Repeated behaviors/mental acts   Inattention:   N/A   Hyperactivity/Impulsivity:   N/A   Oppositional/Defiant Behaviors:   N/A   Emotional Irregularity:   N/A   Other Mood/Personality Symptoms:  No data recorded   Mental Status Exam Appearance and self-care  Stature:   Average   Weight:   Average weight   Clothing:   Neat/clean   Grooming:   Normal   Cosmetic use:   None   Posture/gait:   Normal   Motor activity:   Not Remarkable   Sensorium  Attention:   Normal    Concentration:   Normal   Orientation:   X5   Recall/memory:   Normal   Affect and Mood  Affect:   Depressed   Mood:   Depressed   Relating  Eye contact:   Staring   Facial expression:   Depressed   Attitude toward examiner:   Cooperative   Thought and Language  Speech flow:  Normal   Thought content:   Appropriate to Mood and Circumstances   Preoccupation:   None   Hallucinations:   Auditory (report hearing voices)   Organization:  No data recorded  [employer] of Knowledge:   Fair   Intelligence:   Average   Abstraction:   Normal   Judgement:   Impaired   Reality Testing:  No data recorded  Insight:   Poor   Decision Making:   Confused   Social Functioning  Social Maturity:  No data recorded  Social Judgement:   Normal   Stress  Stressors:   Other (Comment) (not on medication)   Coping Ability:   Normal   Skill Deficits:   Decision making   Supports:   Family     Religion:    Leisure/Recreation:    Exercise/Diet:     CCA Employment/Education Employment/Work Situation:    Education:     CCA Family/Childhood History Family and Relationship History:    Childhood History:     Child/Adolescent Assessment:     CCA Substance Use Alcohol/Drug Use:                           ASAM's:  Six Dimensions of Multidimensional Assessment  Dimension 1:  Acute Intoxication and/or Withdrawal Potential:      Dimension 2:  Biomedical Conditions and Complications:      Dimension 3:  Emotional, Behavioral, or Cognitive Conditions and Complications:     Dimension 4:  Readiness to Change:     Dimension 5:  Relapse, Continued use, or Continued Problem Potential:     Dimension 6:  Recovery/Living Environment:     ASAM Severity Score:    ASAM Recommended Level of Treatment:     Substance use Disorder (SUD)    Recommendations for Services/Supports/Treatments:    Discharge Disposition:     DSM5 Diagnoses: Patient Active Problem List   Diagnosis Date Noted   Agitation    Hearing voices    History of schizophrenia    Schizophrenia ([place]) 12/04/2018   Schizophrenia, paranoid ([place])    Psychosis ([place]) 11/27/2018     Referrals to Alternative Service(s): Referred to Alternative Service(s):   Place:   Date:   Time:    Referred to Alternative Service(s):   Place:   Date:   Time:    Referred to Alternative Service(s):   Place:   Date:   Time:    Referred to Alternative Service(s):   Place:   Date:   Time:     [place] Session

## 2021-02-02 NOTE — Tx Team (Signed)
Initial Treatment Plan 02/02/2021 11:19 PM Wesley Peterson [id number]    PATIENT STRESSORS: Financial difficulties     PATIENT STRENGTHS: Ability for insight  Communication skills  Motivation for treatment/growth    PATIENT IDENTIFIED PROBLEMS: "I just want these voices to stop"  "I had an argument with my Father"                   DISCHARGE CRITERIA:  Ability to meet basic life and health needs Motivation to continue treatment in a less acute level of care Verbal commitment to aftercare and medication compliance  PRELIMINARY DISCHARGE PLAN: Outpatient therapy Return to previous living arrangement  PATIENT/FAMILY INVOLVEMENT: This treatment plan has been presented to and reviewed with the patient, Wesley Peterson, and/or family member.  The patient and family have been given the opportunity to ask questions and make suggestions.  Margarita Rana, RN 02/02/2021, 11:19 PM

## 2021-02-02 NOTE — ED Notes (Signed)
Pt changed into burgundy scrubs belongings labeled and locked up with security and clothes put up. Pt pacing around room talking under his breath but calm and cooperative

## 2021-02-02 NOTE — Progress Notes (Incomplete)
Pt was accepted to [facility] 02/02/2021  Pt meets inpatient criteria per Laveda Abbe, NP  Report can be called to: -Adult unit: [phone]  Care Team notified via secure chat: Gerhard Munch, MD, Alben Deeds, RN, Laveda Abbe, NP, Edward Qualia, MD, [facility] Rona Ravens, RN. Tacy Dura, NT, and Nelly Rout, MD  Kelton Pillar, LCSWA 02/02/2021 @ 5:11 PM

## 2021-02-02 NOTE — Progress Notes (Signed)
Patient ID: Wesley Peterson, male   DOB: [birth year], 36 y.o.   MRN: [id number]  Patient's mother [name] [phone] would like to speak to social work regarding her son. She would like to explain that her son has a video appointment on 02/09/2021 at 9 AM with Social Security for disability. She has been helping him get this started. He currently is unable to work and has no insurance. She is willing to come to [facility] for this appointment if he is still here. She has all of his records and can be present to explain what the family has been going through with [facility] and his mental health issues.

## 2021-02-03 LAB — HEMOGLOBIN A1C
Hgb A1c MFr Bld: 5.6 % (ref 4.8–5.6)
Mean Plasma Glucose: 114 mg/dL

## 2021-02-03 LAB — TSH: TSH: 1.002 u[IU]/mL (ref 0.350–4.500)

## 2021-02-03 NOTE — Progress Notes (Signed)
D:  Patient's self inventory sheet, patient has poor sleep, sleep medication not helpful.  Good appetite, low energy level, good concentration.  Rated depression and hopeless 5, anxiety 8.  Denied withdrawals.  Denied SI.  Goal is to rest today, feels very tired.  Plans to increase his strength.  Goal is call family for help.  May have to go to shelter in [place]. A:  Medications administered per MD orders.  Emotional support and encouragement given patient. R:  Denied SI and HI, contracts for safety.  Denied A/V hallucinations.  Safety maintained with 15 minute checks.

## 2021-02-03 NOTE — Hospital Course (Addendum)
Krishawn Buonomo is a 36 y.o. male with PPHx schizoaffective d/o who presented to [facility] ED for acute psychosis. He was then admitted involuntarily to [facility] for treatment of psychosis exacerbated by poor therapeutic compliance and stimulant use.  During the patient's hospitalization, patient had extensive initial psychiatric evaluation, and follow-up psychiatric evaluations every day.  Psychiatric diagnoses provided upon initial assessment:  Schizoaffective disorder, bipolar type  During hospitalization, the diagnosis list was revised to reflect:  Schizoaffective disorder, bipolar type - currently in depressive episode with psychotic features (mood congruent AH) Tardive dyskinesia  Patient's psychiatric medications were adjusted on admission:  Restarted home Cogentin 1 mg BID for EPS Haldol Decanoate 100 mg IM (01/29/2021)-received prior to admission Restarted home Haldol 5 mg p.o. daily  During the hospitalization, other adjustments were made to the patient's psychiatric medication regimen:  Started Abilify, titrated up to 20 and tapered off due to ineffectiveness Started Ingrezza 40 mg for tardive dyskinesia-lateral motion of lower jaw on distraction Started and titrated Zyprexa 15 mg nightly for residual psychosis and delusions Changed Cogentin to TID PRN Discontinued Haldol for tardive dyskinesia Started and up titrated Prozac to 20 mg daily for MDD with psychotic features  Patient's next Haldol Decanoate 100 mg IM injection is due on 02/24/2021

## 2021-02-03 NOTE — Progress Notes (Signed)
Psychoeducational Group Note  Date:  02/03/2021 Time:  2042  Group Topic/Focus:  Wrap-Up Group:   The focus of this group is to help patients review their daily goal of treatment and discuss progress on daily workbooks.  Participation Level: Did Not Attend  Participation Quality:  Not Applicable  Affect:  Not Applicable  Cognitive:  Not Applicable  Insight:  Not Applicable  Engagement in Group: Not Applicable  Additional Comments:  The patient did not attend group this evening.   Hazle Coca S 02/03/2021, 8:41 PM

## 2021-02-03 NOTE — Group Note (Signed)
Date:  02/03/2021 Time:  11:32 AM  Group Topic/Focus:  Orientation:   The focus of this group is to educate the patient on the purpose and policies of crisis stabilization and provide a format to answer questions about their admission.  The group details unit policies and expectations of patients while admitted.    Participation Level:  Did Not Attend  Participation Quality:      Affect:      Cognitive:      Insight: None  Engagement in Group:      Modes of Intervention:      Additional Comments:   Did not attend  Reymundo Poll 02/03/2021, 11:32 AM
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
## 2021-02-03 NOTE — BHH Counselor (Signed)
CSW spoke with pt's mother [name] [phone]), who shared that this patient has a disability hearing on 02/09/21 at 9am. CSW is to call mother with patient the morning of the meeting in order to have a 3-way call with all parties present.     Ruthann Cancer MSW, LCSW Clincal Social Worker  [facility]
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
## 2021-02-03 NOTE — Plan of Care (Signed)
Nurse discussed anxiety, depression and coping skills with patient.  

## 2021-02-03 NOTE — [facility] Suicide Risk Assessment (Signed)
[facility] Admission Suicide Risk Assessment  Nursing information obtained from:  Patient Demographic factors:  Male, Adolescent or young adult, Unemployed Current Mental Status:  NA Loss Factors:  NA, Financial problems / change in socioeconomic status Historical Factors:  Impulsivity Risk Reduction Factors:  Living with another person, especially a relative, Positive social support  Total Time spent with patient:  I personally spent 1 hour on the unit in direct patient care. The direct patient care time included face-to-face time with the patient, reviewing the patient's chart, communicating with other professionals, and coordinating care. Greater than 50% of this time was spent in counseling or coordinating care with the patient regarding goals of hospitalization, psycho-education, and discharge planning needs.   Principal Problem: Schizophrenia, paranoid ([facility]) Diagnosis:  Principal Problem:   Schizophrenia, paranoid ([facility]) Active Problems:   Schizophrenia, paranoid type ([facility])   Subjective Data:  CC: mdd    [facility] is a 36 y.o. male with PPHx of  schizophrenia, HI, who presented to [facility] for acute psychosis, agitation and concern for HI then admitted involuntarily to [facility] for treatment of schizophrenia exacerbated by poor therapeutic alliance.   Mode of transport to [facility]: [facility] Current Medication List: Haldol Dec,haldol 5 mg daily, cogentin 1 mg daily, depakote 250 BID PRN medication prior to evaluation: None   [facility] course:  Patient was medically cleared by [facility], patient received Haldol decanoate IM per below.   HPI:  Patient stated that he was admitted to [facility] because his dad called the police on him, and he was brought here.  Said there is a [facility], that he does not remember why.  Patient stated that he is currently having AH.  Patient is currently psychotic, and history was limited. Per father, [facility] was called after becoming agitated with concern for HI at  his biological father and stepmother at home, after about 1 week of worsening psychosis. [facility] was discharged [facility] after about 2 weeks of stay.  Father stated that the patient received a shot, was taking his medicines during hospitalization.  Stated "I do not know if the shot wore off, but he was not sleeping and he was pacing and he was talking to himself, until he got really agitated".    Patient denied symptoms of MDD including continuous depressed mood and pervasive sadness, anhedonia, insomnia/hypersomnia, guilt, hopelessness, decreased energy, decreased concentration, forgetfulness, decreased/increased appetite, psychomotor slowing, and suicidal ideations or intentions for >2 weeks.    Patient denied symptoms of generalized anxiety including having difficulty controlling/managing anxiety, that it is out of proportion with stressors, and that it caused feelings of restlessness or being on edge, easily fatigued, concentration difficulty, irritability, muscle tension, and sleep disturbance. Patient denied having difficulty controlling worry.    Currently, patient denied SI/HI, and endorsed AH of voices of people he has met before, saying "he and she".   Unable to obtain given patient's psychosis. Past Psychiatric History: Past psych diagnoses: Schizophrenia, cannabis abuse Prior inpatient treatment: [facility] (12/2021), unclear if others given patient has been traveling to different states Suicide attempts: Denied Psychiatric med trials:  Neuromodulation history:  Current outpatient psychiatrist:  Current outpatient therapist:  History of selective adherence: Yes.      Family Psychiatric History: Completed/attempted suicide:  Bipolar spectrum disorder:  Schizophrenia spectrum disorder: Mom and maternal uncle Substance abuse: Multiple family members, crack cocaine, cannabis   Substance Abuse History: Alcohol:  Nicotine:  Illicit  drugs:  Rx drug  abuse:  Rehab hx:  Seizure hx:    Social History:  Childhood:  Abuse:  Marital Status: Denied Children: Denied Income: None, mom has since applied for disabilities Peer Group:  Housing: Best boy: [place] time in [place], [place] for fighting with golf clubs.  Multiple charges such as trespassing, currently has charges for trespassing Military:   Continued Clinical Symptoms:  Alcohol Use Disorder Identification Test Final Score (AUDIT): 0 The "Alcohol Use Disorders Identification Test", Guidelines for Use in Primary Care, Second Edition.  [facility]). Score between 0-7:  no or low risk or alcohol related problems. Score between 8-15:  moderate risk of alcohol related problems. Score between 16-19:  high risk of alcohol related problems. Score 20 or above:  warrants further diagnostic evaluation for alcohol dependence and treatment.   CLINICAL FACTORS:  Schizophrenia:   Paranoid or undifferentiated type Unstable or Poor Therapeutic Relationship Previous Psychiatric Diagnoses and Treatments   Musculoskeletal: Strength & Muscle Tone: within normal limits Gait & Station: normal Patient leans: N/A  Psychiatric Specialty Exam: Presentation  General Appearance: Disheveled (Seen laying in bed, fatigued appearing)  Eye Contact:Poor (Downward gaze)  Speech:Blocked; Garbled; Slow; Slurred  Speech Volume:Decreased  Handedness:Right   Mood and Affect  Mood:Dysphoric ("Tired")  Affect:Congruent; Blunt   Thought Process  Thought Processes:Irrevelant (Incoherent)  Descriptions of Associations:Circumstantial  Orientation:Other (comment) (Unable to assess due to psychosis)  Thought Content:Paranoid Ideation; Rumination (Limited due to psychosis. Denied SI/HI. Reported AH)   History of Schizophrenia/Schizoaffective disorder:Yes  Duration of Psychotic Symptoms:Greater than six months  Hallucinations:Hallucinations:  Auditory Description of Auditory Hallucinations: "hearing his. her, his, her, and they are people that I know"   Ideas of Reference:Delusions; Paranoia; Percusatory  Suicidal Thoughts:Suicidal Thoughts: No  Homicidal Thoughts:Homicidal Thoughts: No   Sensorium  Memory:Immediate Fair; Recent Fair; Remote Echo   Executive Functions  Concentration:Fair  Attention Span:Poor  Recall:Poor  Fund of Knowledge:Fair  Language:Fair   Psychomotor Activity  Psychomotor Activity:Psychomotor Activity: Decreased; Psychomotor Retardation   Assets  Assets:Leisure Time; Physical Health   Sleep  Sleep:Sleep: Fair   Physical Exam: Body mass index is 20.08 kg/m. Blood pressure 112/71, pulse (!) 116, temperature 98 F (36.7 C), temperature source Oral, resp. rate 16, height 6' 1.2" (1.859 m), weight 69.4 kg, SpO2 98 %.   Physical Exam Vitals and nursing note reviewed.  Constitutional:      General: He is awake. He is not in acute distress.    Appearance: He is normal weight. He is not ill-appearing or diaphoretic.  HENT:     Head: Normocephalic.  Pulmonary:     Effort: Pulmonary effort is normal.  Neurological:     General: No focal deficit present.     Mental Status: He is alert.     Motor: No weakness.     Gait: Gait normal.  Psychiatric:        Mood and Affect: Mood normal.        Behavior: Behavior normal. Behavior is cooperative.        Thought Content: Thought content normal.        Judgment: Judgment normal.    Review of Systems  Constitutional:  Negative for chills, fever and weight loss.  Respiratory:  Negative for cough, shortness of breath and wheezing.   Cardiovascular:  Negative for chest pain and palpitations.  Gastrointestinal:  Negative for abdominal pain.  Skin:  Negative for rash.  Neurological:  Negative for dizziness.  Psychiatric/Behavioral:  Positive  for hallucinations.    COGNITIVE FEATURES THAT CONTRIBUTE TO  RISK:  Closed-mindedness, Loss of executive function, Polarized thinking, and Thought constriction (tunnel vision)    SUICIDE RISK:  Severe: Frequent, intense, and enduring suicidal ideation, specific plan, no subjective intent, but some objective markers of intent (i.e., choice of lethal method), the method is accessible, some limited preparatory behavior, evidence of impaired self-control, severe dysphoria/symptomatology, multiple risk factors present, and few if any protective factors, particularly a lack of social support.  PLAN OF CARE:  Patient met requirement for inpatient psychiatric hospitalization and has been admitted.  See H&P & attending's attestation for detailed plan.  I certify that inpatient services furnished can reasonably be expected to improve the patient's condition.   Signed: Merrily Brittle, DO Psychiatry Resident, PGY-1 [facility] 02/03/2021, 4:35 PM

## 2021-02-03 NOTE — [facility] Suicide Risk Assessment (Signed)
[facility] INPATIENT:  Family/Significant Other Suicide Prevention Education  Suicide Prevention Education:  Education Completed;  father, Hamzeh Tall ([phone]) has been identified by the patient as the family member/significant other with whom the patient will be residing, and identified as the person(s) who will aid the patient in the event of a mental health crisis (suicidal ideations/suicide attempt).  With written consent from the patient, the family member/significant other has been provided the following suicide prevention education, prior to the and/or following the discharge of the patient.  CSW spoke with this pt's father who reported that this pt had previously been to [facility] was given and injection and when he came home he was doing great for about 10-11 days. He then began to act a little strange and was taken to [facility]. He was given an injection and released on Monday- 01/31/21. He was "cranky" when he was picked up and was paranoid that people went upstairs and messed up his stuff. He was unable to be reasoned with and found an old pill bottle under the sofa that he tried to use as proof that someone had been there. He could not be satisfied. Later in the evening he came to his father and step-mother and told them to stop playing games with him. He began to yell and curse and pt's step-mother noticed he was holding a knife in his hand that was not open.  He will leave the house at 2-3am and neighbors have called the police about someone "prowling" the neighborhood.  He is not able to return to stay with his father due to his step-mother being scared. He has lived with several family members and has acted out everywhere he goes. Hr states that everyone is fed up with him.  He has a disability hearing early in February that his biological mother has the details too.  Pt's mother has custody of pt's son.   The suicide prevention education provided includes the  following: Suicide risk factors Suicide prevention and interventions National Suicide Hotline telephone number [facility] assessment telephone number [facility] Emergency Assistance 911 [facility] and/or Residential Mobile Crisis Unit telephone number  Request made of family/significant other to: Remove weapons (e.g., guns, rifles, knives), all items previously/currently identified as safety concern.   Remove drugs/medications (over-the-counter, prescriptions, illicit drugs), all items previously/currently identified as a safety concern.  The family member/significant other verbalizes understanding of the suicide prevention education information provided.  The family member/significant other agrees to remove the items of safety concern listed above.  Tage Feggins A Ladrea Holladay 02/03/2021, 1:38 PM

## 2021-02-03 NOTE — [facility] Group Notes (Signed)
Pt did not attend 1400 group. ?

## 2021-02-04 ENCOUNTER — Encounter (HOSPITAL_COMMUNITY): Payer: Self-pay | Admitting: Psychiatry

## 2021-02-04 ENCOUNTER — Encounter (HOSPITAL_COMMUNITY): Payer: Self-pay

## 2021-02-04 DIAGNOSIS — Z5900 Homelessness unspecified: Secondary | ICD-10-CM

## 2021-02-04 DIAGNOSIS — F250 Schizoaffective disorder, bipolar type: Secondary | ICD-10-CM | POA: Diagnosis present

## 2021-02-04 LAB — HEMOGLOBIN A1C
Hgb A1c MFr Bld: 6 % — ABNORMAL HIGH (ref 4.8–5.6)
Mean Plasma Glucose: 126 mg/dL

## 2021-02-04 MED ORDER — OLANZAPINE 5 MG PO TBDP: 5.0000 mg | ORAL_TABLET | Freq: Three times a day (TID) | ORAL | Status: DC | PRN

## 2021-02-04 MED ORDER — ZIPRASIDONE MESYLATE 20 MG IM SOLR: 20.0000 mg | INTRAMUSCULAR | Status: DC | PRN

## 2021-02-04 MED ORDER — LORAZEPAM 1 MG PO TABS: 1.0000 mg | ORAL_TABLET | ORAL | Status: DC | PRN

## 2021-02-04 NOTE — BH IP Treatment Plan (Signed)
Interdisciplinary Treatment and Diagnostic Plan Update  02/04/2021 [facility] MRN: [id number]  Principal Diagnosis: Schizoaffective disorder, bipolar type [facility])  Secondary Diagnoses: Principal Problem:   Schizoaffective disorder, bipolar type ([place]) Active Problems:   Agitation   Homeless   Current Medications:  Current Facility-Administered Medications  Medication Dose Route Frequency Provider Last Rate Last Admin   acetaminophen (TYLENOL) tablet 650 mg  650 mg Oral Q6H PRN Ethelene Hal, NP       alum & mag hydroxide-simeth (MAALOX/MYLANTA) 200-200-20 MG/5ML suspension 30 mL  30 mL Oral Q4H PRN Ethelene Hal, NP       benztropine (COGENTIN) tablet 1 mg  1 mg Oral QHS Ethelene Hal, NP   1 mg at 02/03/21 2133   divalproex (DEPAKOTE) DR tablet 250 mg  250 mg Oral Q12H Ethelene Hal, NP   250 mg at 02/04/21 0755   haloperidol (HALDOL) tablet 5 mg  5 mg Oral Daily Ethelene Hal, NP   5 mg at 02/04/21 0755   hydrOXYzine (ATARAX) tablet 25 mg  25 mg Oral TID PRN Ethelene Hal, NP   25 mg at 02/03/21 2132   magnesium hydroxide (MILK OF MAGNESIA) suspension 30 mL  30 mL Oral Daily PRN Ethelene Hal, NP       traZODone (DESYREL) tablet 50 mg  50 mg Oral QHS,MR X 1 Ethelene Hal, NP   50 mg at 02/03/21 2132   PTA Medications: Medications Prior to Admission  Medication Sig Dispense Refill Last Dose   amphetamine-dextroamphetamine (ADDERALL) 5 MG tablet Take 10 mg by mouth every morning. (Patient not taking: Reported on 01/28/2021)      benztropine (COGENTIN) 1 MG tablet Take 1 tablet (1 mg total) by mouth 2 (two) times daily. (Patient not taking: Reported on 01/28/2021) 60 tablet 0    [START ON 02/24/2021] haloperidol decanoate (HALDOL DECANOATE) 100 MG/ML injection Inject 1 mL (100 mg total) into the muscle every 28 (twenty-eight) days. 1 mL 0     Patient Stressors: Financial difficulties    Patient Strengths: Ability for Systems developer for treatment/growth   Treatment Modalities: Medication Management, Group therapy, Case management,  1 to 1 session with clinician, Psychoeducation, Recreational therapy.   Physician Treatment Plan for Primary Diagnosis: Schizoaffective disorder, bipolar type ([place]) Long Term Goal(s):     Short Term Goals:    Medication Management: Evaluate patient's response, side effects, and tolerance of medication regimen.  Therapeutic Interventions: 1 to 1 sessions, Unit Group sessions and Medication administration.  Evaluation of Outcomes: Not Met  Physician Treatment Plan for Secondary Diagnosis: Principal Problem:   Schizoaffective disorder, bipolar type ([place]) Active Problems:   Agitation   Homeless  Long Term Goal(s):     Short Term Goals:       Medication Management: Evaluate patient's response, side effects, and tolerance of medication regimen.  Therapeutic Interventions: 1 to 1 sessions, Unit Group sessions and Medication administration.  Evaluation of Outcomes: Not Met   RN Treatment Plan for Primary Diagnosis: Schizoaffective disorder, bipolar type ([place]) Long Term Goal(s): Knowledge of disease and therapeutic regimen to maintain health will improve  Short Term Goals: Ability to participate in decision making will improve, Ability to verbalize feelings will improve, and Ability to identify and develop effective coping behaviors will improve  Medication Management: RN will administer medications as ordered by provider, will assess and evaluate patient's response and provide education to patient for prescribed medication. RN will report  any adverse and/or side effects to prescribing provider.  Therapeutic Interventions: 1 on 1 counseling sessions, Psychoeducation, Medication administration, Evaluate responses to treatment, Monitor vital signs and CBGs as ordered, Perform/monitor CIWA, COWS, AIMS and Fall Risk screenings as ordered, Perform wound  care treatments as ordered.  Evaluation of Outcomes: Not Met   LCSW Treatment Plan for Primary Diagnosis: Schizoaffective disorder, bipolar type ([place]) Long Term Goal(s): Safe transition to appropriate next level of care at discharge, Engage patient in therapeutic group addressing interpersonal concerns.  Short Term Goals: Engage patient in aftercare planning with referrals and resources, Increase emotional regulation, and Facilitate patient progression through stages of change regarding substance use diagnoses and concerns  Therapeutic Interventions: Assess for all discharge needs, 1 to 1 time with Social worker, Explore available resources and support systems, Assess for adequacy in community support network, Educate family and significant other(s) on suicide prevention, Complete Psychosocial Assessment, Interpersonal group therapy.  Evaluation of Outcomes: Not Met   Progress in Treatment: Attending groups: No. Participating in groups: No. Taking medication as prescribed: Yes. Toleration medication: Yes. Family/Significant other contact made: Yes, individual(s) contacted:  father, Keante Urizar [phone]) Patient understands diagnosis: No. Discussing patient identified problems/goals with staff: Yes. Medical problems stabilized or resolved: Yes. Denies suicidal/homicidal ideation: Yes. Issues/concerns per patient self-inventory: No. Other: None  New problem(s) identified: No, Describe:  None  New Short Term/Long Term Goal(s):medication stabilization, elimination of SI thoughts, development of comprehensive mental wellness plan.   Patient Goals:  "work on my strength"  Discharge Plan or Barriers: Patient recently admitted. CSW will continue to follow and assess for appropriate referrals and possible discharge planning.   Reason for Continuation of Hospitalization: Delusions  Hallucinations Medication stabilization  Estimated Length of Stay: 3-5 days   Scribe for  Treatment Team: Eliott Nine 02/04/2021 11:40 AM

## 2021-02-04 NOTE — [facility] Counselor (Signed)
At patient and attorneys request CSW faxed letter disclosing patient is currently in the hospital to attorneys office.   Attorney Riesa Pope Phone: [phone] Fax: [phone]     Ruthann Cancer MSW, LCSW Clincal Social Worker  [facility]

## 2021-02-04 NOTE — Group Note (Signed)
Recreation Therapy Group Note   Group Topic:Stress Management  Group Date: 02/04/2021 Start Time: 0935 End Time: 0955 Facilitators: Caroll Rancher, LRT,CTRS Location: [address] Dayroom   Goal Area(s) Addresses:  Patient will actively participate in stress management techniques presented during session.  Patient will successfully identify benefit of practicing stress management post d/c.   Group Description: Guided Imagery. LRT provided education, instruction, and demonstration on practice of visualization via guided imagery. Patient was asked to participate in the technique introduced during session. LRT debriefed including topics of mindfulness, stress management and specific scenarios each patient could use these techniques. Patients were given suggestions of ways to access scripts post d/c and encouraged to explore Youtube and other apps available on smartphones, tablets, and computers.   Affect/Mood: N/A   Participation Level: Did not attend    Clinical Observations/Individualized Feedback:     Plan: Continue to engage patient in RT group sessions 2-3x/week.   Caroll Rancher, LRT,CTRS 02/04/2021 12:05 PM

## 2021-02-04 NOTE — Progress Notes (Signed)
[facility] MD Progress Note  02/04/2021 9:03 AM Wesley Peterson  MRN: [id number] CC: psychosis and agitation  Subjective: Wesley Peterson is a 36 y.o. male with PPHx schizophrenia, HI, who presented to [facility] ED for acute psychosis, agitation and concern for HI then admitted involuntarily to [facility] for treatment of schizophrenia exacerbated by poor therapeutic alliance. [place] Day 2   Interim History: Patient was evaluated today in patient room on [place]. Patient's speech was non-spontaneous, minimal, and thought blocking, possible internally preoccupied.  Patient reported no change in AH since yesterday and excessive AM drowsiness. Reported stable appetite and declined to answer how his sleep was. Patient denied SI/HI/VH, and contracted to safety on the unit. Patient had no other concerns.  Diagnosis:  Principal Problem:   Schizoaffective disorder, bipolar type ([place]) Active Problems:   Agitation   Homeless  Past Psychiatric History: See H&P  Past Medical History:  Past Medical History:  Diagnosis Date   Schizophrenia ([place])    Schizophrenia, paranoid type ([place]) 02/02/2021   History reviewed. No pertinent surgical history. Family History: History reviewed. No pertinent family history. Family Psychiatric  History: See H&P Social History:  Social History   Substance and Sexual Activity  Alcohol Use Not Currently   Alcohol/week: 1.0 standard drink   Types: 1 Cans of beer per week   Comment: occasional     Social History   Substance and Sexual Activity  Drug Use Never    Social History   Socioeconomic History   Marital status: Single    Spouse name: Not on file   Number of children: 1   Years of education: Not on file   Highest education level: Not on file  Occupational History   Occupation: Unemployed  Tobacco Use   Smoking status: Never   Smokeless tobacco: Never  Vaping Use   Vaping Use: Never used  Substance and Sexual Activity   Alcohol use: Not Currently     Alcohol/week: 1.0 standard drink    Types: 1 Cans of beer per week    Comment: occasional   Drug use: Never   Sexual activity: Never  Other Topics Concern   Not on file  Social History Narrative   ** Merged History Encounter **       Pt lives with mother and other relatives.  Currently unemployed.  [place] outpatient psychiatry services through [facility].   Social Determinants of Health   Financial Resource Strain: Not on file  Food Insecurity: Not on file  Transportation Needs: Not on file  Physical Activity: Not on file  Stress: Not on file  Social Connections: Not on file   Additional Social History:                         Sleep: Per above  Appetite:  Per above  Current Medications: Current Facility-Administered Medications  Medication Dose Route Frequency Provider Last Rate Last Admin   acetaminophen (TYLENOL) tablet 650 mg  650 mg Oral Q6H PRN Ethelene Hal, NP       alum & mag hydroxide-simeth (MAALOX/MYLANTA) 200-200-20 MG/5ML suspension 30 mL  30 mL Oral Q4H PRN Ethelene Hal, NP       benztropine (COGENTIN) tablet 1 mg  1 mg Oral QHS Ethelene Hal, NP   1 mg at 02/03/21 2133   divalproex (DEPAKOTE) DR tablet 250 mg  250 mg Oral Q12H Ethelene Hal, NP   250 mg at 02/04/21 [phone]  haloperidol (HALDOL) tablet 5 mg  5 mg Oral Daily Ethelene Hal, NP   5 mg at 02/04/21 [id number]   hydrOXYzine (ATARAX) tablet 25 mg  25 mg Oral TID PRN Ethelene Hal, NP   25 mg at 02/03/21 2132   magnesium hydroxide (MILK OF MAGNESIA) suspension 30 mL  30 mL Oral Daily PRN Ethelene Hal, NP       traZODone (DESYREL) tablet 50 mg  50 mg Oral QHS,MR X 1 Ethelene Hal, NP   50 mg at 02/03/21 2132    Lab Results:  Results for orders placed or performed during the hospital encounter of 02/02/21 (from the past 48 hour(s))  TSH     Status: None   Collection Time: 02/03/21  6:19 AM  Result Value Ref Range   TSH 1.002 0.350 -  4.500 uIU/mL    Comment: Performed by a 3rd Generation assay with a functional sensitivity of <=0.01 uIU/mL. Performed at [facility], [place] [address], [place], [place] [address]   Hemoglobin A1c     Status: Abnormal   Collection Time: 02/03/21  6:19 AM  Result Value Ref Range   Hgb A1c MFr Bld 6.0 (H) 4.8 - 5.6 %    Comment: (NOTE)         Prediabetes: 5.7 - 6.4         Diabetes: >6.4         Glycemic control for adults with diabetes: <7.0    Mean Plasma Glucose 126 mg/dL    Comment: (NOTE) Performed At: [facility] [place], [place] [id number] Rush Farmer MD [id number]     Blood Alcohol level:  Lab Results  Component Value Date   [facility] <10 02/02/2021   ETH <10 [id number]    Metabolic Disorder Labs: Lab Results  Component Value Date   HGBA1C 6.0 (H) 02/03/2021   MPG 126 02/03/2021   MPG 114 02/02/2021   Lab Results  Component Value Date   PROLACTIN 23.4 (H) 11/28/2018   Lab Results  Component Value Date   CHOL 131 01/30/2021   TRIG 104 01/30/2021   HDL 30 (L) 01/30/2021   CHOLHDL 4.4 01/30/2021   VLDL 21 01/30/2021   LDLCALC 80 01/30/2021   LDLCALC 69 08/25/2019    Physical Findings: AIMS:   ,  ,   ,   ,     CIWA:    COWS:     Musculoskeletal: Strength & Muscle Tone: within normal limits Gait & Station: normal Patient leans: N/A    02/04/21 0854  Presentation  General Appearance Disheveled (Hair in front of face)  Eye Contact Poor (Down cast gaze)  Speech Clear and Coherent;Slow;Blocked;Slurred (Non-spontaneous)  Speech Volume Decreased  Handedness Right  Mood and Affect  Mood Dysphoric ("tired")  Affect Appropriate;Blunt  Thought Processes  Thought Process Irrevelant;Goal Directed  Descriptions of Associations Circumstantial  Orientation Other (comment) (Unable to assess due to psychosis)  Thought Content Paranoid Ideation;Delusions;Rumination (Denied SI/HI. Admitted to [facility] - he, she,  he, she, he)  Has patient ever had a diagnosis of schizophrenia or schizoaffective disorder in the past? Yes  Duration of Psychotic Symptoms Greater than six months  Hallucinations Auditory  Description of Auditory Hallucinations "hearing his. her, his, her, and they are people that I know"  Ideas of Reference Delusions;Paranoia;Percusatory  Suicidal Thoughts No  Homicidal Thoughts No  Sensorium  Memory Immediate Fair;Remote Poor;Recent Fair  Judgment Fair  Insight Lacking  Community education officer  Concentration Fair  Attention Span Poor  Recall Poor  Fund of Knowledge Fair  Language Fair  Psychomotor Activity  Psychomotor Activity Decreased  Assets  Assets Leisure Time;Physical Health;Talents/Skills  Sleep  Sleep Fair      Physical Exam: Vitals:   02/03/21 0625 02/03/21 2122 02/04/21 0614 02/04/21 0615  BP: 112/71 109/89 113/80 116/82  Pulse: (!) 116 (!) 107 65 94  Temp:   (!) 97.5 F (36.4 C)   Resp: 16     Height:      Weight:      SpO2: 98% 98%  98%  TempSrc:   Oral   BMI (Calculated):         Physical Exam Vitals and nursing note reviewed.  Constitutional:      General: He is awake. He is not in acute distress.    Appearance: He is not ill-appearing or diaphoretic.  HENT:     Head: Normocephalic.  Pulmonary:     Effort: Pulmonary effort is normal.  Neurological:     General: No focal deficit present.     Mental Status: He is alert.  Psychiatric:        Behavior: Behavior is cooperative.    Review of Systems  Respiratory:  Negative for shortness of breath.   Cardiovascular:  Negative for chest pain.  Gastrointestinal:  Negative for nausea and vomiting.  Neurological:  Negative for dizziness and headaches.    Treatment Plan & Assessment Summary: Principal Problem:   Schizoaffective disorder, bipolar type [facility]) Active Problems:   Agitation   Homeless   Wesley Peterson is a 36 y.o. male schizophrenia, HI, who presented to [facility] ED for acute  psychosis, agitation and concern for HI then admitted involuntarily to [facility] for treatment of schizophrenia exacerbated by poor therapeutic alliance. [place] Day 2   PLAN: Schizophrenia-paranoid type Received Haldol Decanoate 100 mg IM (01/29/2021), next dose per below Continued Haldol 5 mg PO daily Continued Cogentin 1 mg qHS   Depakote DR 250 mg q12hrs  Continued Trazadone 50 mg qHS  Prediabetes A1c 6 Recommended follow-up with PCP after discharge Will encourage lifestyle modification Will consider metformin if patient is amenable, however unsure of med adherence after dc given hx and disease  Dispo: Patient, No Pcp Per (Inactive)  Dispo: homeless shelter (Patient was living with father prior to admission) Pending clinical improvement IVC'd by father Nicholus Fergus [phone] Patient's next Haldol Decanoate 1000 mg IM injection is due on 02/24/2021  Safety, monitoring and disposition planning: The patient was seen and evaluated on the unit.  The patient's chart was reviewed and nursing notes were reviewed.  The patient's case was discussed in multidisciplinary team meeting.  Plan and drug side effects were discussed with patient who was amendable. Social work and case management to assist with discharge planning and identification of hospital follow-up needs prior to discharge. Discharge Concerns: Need to establish a safety plan; Medication compliance and effectiveness Discharge Goals: Return home with outpatient referrals for mental health follow-up including medication management/psychotherapy Safety and Monitoring: Involuntary status at inpatient psychiatric unit for safety, stabilization and treatment Daily contact with patient to assess and evaluate symptoms and progress in treatment and medical management Patient's case to be discussed in multi-disciplinary team meeting Observation Level: Per above Vital signs:  q12 hours Precautions: suicide   Signed: Merrily Brittle, DO Psychiatry Resident, PGY-1 [facility] 02/04/2021, 9:03 AM

## 2021-02-04 NOTE — Progress Notes (Signed)
Progress note  Pt found in bed. Pt compliant with medication administration. Pt is guarded, minimal, and preoccupied with a pensive affect and poor eye contact. Pt forwards little but is pleasant. Pt did express they were still hearing voices but denied these being of concern. Pt is pleasant. Pt denies si/hi/ah/vh and verbally agrees to approach staff if these become apparent or before harming themselves/others while at [place].  A: Pt provided support and encouragement. Pt given medication per protocol and standing orders. Q44m safety checks implemented and continued.  R: Pt safe on the unit. Will continue to monitor.

## 2021-02-04 NOTE — Progress Notes (Signed)
Patient compliant with medication denies SI/HI/VH and endorses auditory hallucinations but stated they were not as bad this shift. Patient is isolative in his room most of the shift.   No adverse drug noted. Support and encouragement provided.

## 2021-02-04 NOTE — Group Note (Signed)
Date:  02/04/2021 Time:  10:34 AM  Group Topic/Focus:  Orientation:   The focus of this group is to educate the patient on the purpose and policies of crisis stabilization and provide a format to answer questions about their admission.  The group details unit policies and expectations of patients while admitted.    Participation Level:  Did Not Attend  Participation Quality:      Affect:      Cognitive:      Insight: None  Engagement in Group:  Lacking  Modes of Intervention:  Discussion  Additional Comments:   Did not attend  Reymundo Poll 02/04/2021, 10:34 AM

## 2021-02-04 NOTE — Group Note (Signed)
Type of Therapy and Topic:  Group Therapy:  Stress Management   Participation Level:  Active    Description of Group:  Patients in this group were introduced to the idea of stress and encouraged to discuss negative and positive ways to manage stress. Patients discussed specific stressors that they have in their life right now and the physical signs and symptoms associated with that stress.  Patient encouraged to come up with positive changes to assist with the stress upon discharge in order to prevent future hospitalizations.   They also worked as a group on developing a specific plan for several patients to deal with stressors through boundary-setting, psychoeducation and self care techniques   Therapeutic Goals:               1)  To discuss the positive and negative impacts of stress             2)  identify signs and symptoms of stress             3)  generate ideas for stress management             4)  offer mutual support to others regarding stress management             5)  Developing plans for ways to manage specific stressors upon discharge               Summary of Patient Progress:  Patient participated appropriately in group.  Patient discussed that he has overcoming coming to group.  Patient reports that usually he sleeps through group.  Patient participated in circle of control activity to assist with coping with anxiety and stress.  Patient was open to feedback to other group peers.    Therapeutic Modalities:   Motivational Interviewing Brief Solution-Focused Therapy    Lindsey Demonte, LCSW, LCAS Clincal Social Worker  [facility]

## 2021-02-04 NOTE — BHH Group Notes (Signed)
Patient did not attend the relaxation group. 

## 2021-02-04 NOTE — Plan of Care (Signed)
  Problem: Education: Goal: Knowledge of [facility] Education information/materials will improve Outcome: Progressing Goal: Emotional status will improve Outcome: Progressing Goal: Mental status will improve Outcome: Progressing   

## 2021-02-05 DIAGNOSIS — F250 Schizoaffective disorder, bipolar type: Principal | ICD-10-CM

## 2021-02-05 NOTE — Progress Notes (Signed)
Patient denies SI, HI and VH this shift. Patient endorses auditory hallucinations.  Patient has been compliant with medications and is encouraged to attend groups which he declined.  Patient has had no incidents of dyscontrol this shift.   Assess patient for safety, offer medications as prescribed, engage patient in 1:1 staff talks.   Patient able to contract for safety. Continue to monitor as planned.

## 2021-02-05 NOTE — BHH Group Notes (Signed)
.  Psychoeducational Group Note    Date:02/05/21 Time: 1300-1400    Purpose of Group: . The group focus' on teaching patients on how to identify their needs and their Life Skills:  A group where two lists are made. What people need and what are things that we do that are unhealthy. The lists are developed by the patients and it is explained that we often do the actions that are not healthy to get our list of needs met.  Goal:: to develop the coping skills needed to get their needs met  Participation Level:  Active  Participation Quality:  Appropriate  Affect:  Appropriate  Cognitive:  Oriented  Insight:  Improving  Engagement in Group:  Engaged  Additional Comments: Rates his energy at a 7/10. Participated in the group giving some feelings as well as things we do that are unhealthy.   Wesley Peterson

## 2021-02-05 NOTE — BHH Group Notes (Signed)
Goals Group 02/05/21    Group Focus: affirmation, clarity of thought, and goals/reality orientation Treatment Modality:  Psychoeducation Interventions utilized were assignment, group exercise, and support Purpose: To be able to understand and verbalize the reason for their admission to the hospital. To understand that the medication helps with their chemical imbalance but they also need to work on their choices in life. To be challenged to develop Wesley Peterson list of 30 positives about themselves. Also introduce the concept that "feelings" are not reality.  Participation Level:  Did not attendA Vira Blanco Wesley Peterson

## 2021-02-05 NOTE — Group Note (Signed)
LCSW Group Therapy Note  Group Date: 02/05/2021 Start Time: 1030 End Time: 1130   Type of Therapy and Topic:  Group Therapy - Healthy vs Unhealthy Coping Skills  Participation Level:  Did Not Attend   Description of Group The focus of this group was to determine what unhealthy coping techniques typically are used by group members and what healthy coping techniques would be helpful in coping with various problems. Patients were guided in becoming aware of the differences between healthy and unhealthy coping techniques. Patients were asked to identify 2-3 healthy coping skills they would like to learn to use more effectively.  Therapeutic Goals Patients learned that coping is what human beings do all day long to deal with various situations in their lives Patients defined and discussed healthy vs unhealthy coping techniques Patients identified their preferred coping techniques and identified whether these were healthy or unhealthy Patients determined 2-3 healthy coping skills they would like to become more familiar with and use more often. Patients provided support and ideas to each other   Summary of Patient Progress:  Patient was invited to group, did not attend.   Therapeutic Modalities Cognitive Behavioral Therapy Motivational Interviewing  Burnard Bunting 02/05/2021  1:54 PM

## 2021-02-05 NOTE — Progress Notes (Signed)
D- Patient alert and oriented x4. Patient in stable mood.  Denies SI, HI, and VH. Pt interacting positively with staff and peers. Pt appears to be preoccupied while in his room and also responding to internal stimuli. PT stated he remains having auditory hallucinations. He states its people he knows who talks to him. Pt states the medicine he has been giving hasn't help the voices go away but he would like for it to stop.   A- Scheduled medications administered to patient, per MD orders.Routine safety checks conducted every 15 minutes.  Patient informed to notify staff with problems or concerns.   R- Patient compliant with medications and verbalized understanding treatment plan.    02/05/21 2110  Psych Admission Type (Psych Patients Only)  Admission Status Involuntary  Psychosocial Assessment  Patient Complaints Anxiety;Other (Comment) (Auditory Hallucinations)  Eye Contact Brief  Facial Expression Flat  Affect Anxious  Speech Logical/coherent;Soft  Interaction Forwards little;Guarded  Motor Activity Fidgety  Appearance/Hygiene Disheveled  Behavior Characteristics Anxious  Mood Anxious;Preoccupied  Thought Radio producer  Content Paranoia  Delusions Paranoid  Perception Hallucinations  Hallucination Auditory  Judgment Poor  Confusion None  Danger to Self  Current suicidal ideation? Denies  Danger to Others  Danger to Others None reported or observed

## 2021-02-05 NOTE — Progress Notes (Signed)
Adult Psychoeducational Group Note  Date:  02/05/2021 Time:  11:39 PM  Group Topic/Focus:  Wrap-Up Group:   The focus of this group is to help patients review their daily goal of treatment and discuss progress on daily workbooks.  Participation Level:  Minimal  Participation Quality:  Appropriate  Affect:  Appropriate  Cognitive:  Appropriate  Insight: Appropriate  Engagement in Group:  Limited  Modes of Intervention:  Discussion  Additional Comments:  Pt stated his goal for today was to focus on his treatment plan. Pt stated he accomplished his goal today. Pt stated he did not talk with his doctor or with his social worker about his care today. Pt rated his overall day a 8 out of 10. Pt stated he made no calls today. Pt stated he felt better about himself today. Pt stated he was able to attend all meals. Pt stated he took all medications provided today. Pt stated his appetite was pretty good today. Pt rated sleep last night was pretty good. Pt stated the goal tonight was to get some rest. Pt stated he had some physical pain tonight. Pt stated he had some severe pain in his left and right arm. Pt rated the severe pain in his arms a 10 on the pain level scale. Pt nurse was updated on the situation.Pt deny visual hallucinations tonight. Pt admitted to dealing with auditory issues tonight. Pt nurse was updated on the situation. Pt denies thoughts of harming himself or others. Pt stated he would alert staff if anything changed  Wesley Peterson 02/05/2021, 11:39 PM

## 2021-02-05 NOTE — Progress Notes (Signed)
°   02/04/21 2152  Psych Admission Type (Psych Patients Only)  Admission Status Involuntary  Psychosocial Assessment  Patient Complaints None  Eye Contact Brief  Facial Expression Flat;Blank  Affect Appropriate to circumstance  Speech Logical/coherent;Soft  Interaction Forwards little;Minimal;Guarded  Motor Activity Slow  Appearance/Hygiene Disheveled  Behavior Characteristics Cooperative;Appropriate to situation  Mood Pleasant  Thought Process  Coherency Blocking  Content Paranoia  Delusions Paranoid  Perception Hallucinations  Hallucination Auditory (Patient denies but appears to be responding to internal stimuli along with thought blocking)  Judgment Poor  Confusion None  Danger to Self  Current suicidal ideation? Denies  Danger to Others  Danger to Others None reported or observed

## 2021-02-05 NOTE — Progress Notes (Signed)
[facility] MD Progress Note  02/05/2021 4:34 PM Wesley Peterson  MRN: [id number]  Subjective: Wesley Peterson reports, "I'm still hearing voices".  Daily notes 02-05-21: Wesley Peterson is seen in his room. He is lying down in bed. He is verbally responsive in a monotonic voice. He is making a fair eye contact. His affect is flat. He appears internally preoccupied.  Patient reports no changes in his auditory hallucinations. He described the voices as chattering, unable to make out what the voices are saying. He says he came to the hospital because of the voices. It appears Wesley Peterson spends most of his time in bed in his room. He is encouraged to come out of his room & participate in the group milieu. He reports stable appetite. He currently denies any SIHI, VH, delusional thoughts or paranoia. SI/HI/VH. He is able to contract for safety on the unit. He denies any other issues or concerns. Will continue current plan of care as already in progress.  Reason for admission: Wesley Peterson is a 36 y.o. male with PPHx schizophrenia, HI, who presented to [facility] ED for acute psychosis, agitation and concern for HI then admitted involuntarily to [facility] for treatment of schizophrenia exacerbated by poor therapeutic alliance. [place] Day 3    Diagnosis:  Principal Problem:   Schizoaffective disorder, bipolar type ([place]) Active Problems:   Homeless  Past Psychiatric History: See H&P  Past Medical History:  Past Medical History:  Diagnosis Date   Schizophrenia ([place])    Schizophrenia, paranoid type ([place]) 02/02/2021   History reviewed. No pertinent surgical history.  Family History: History reviewed. No pertinent family history.  Family Psychiatric  History: See H&P  Social History:  Social History   Substance and Sexual Activity  Alcohol Use Not Currently   Alcohol/week: 1.0 standard drink   Types: 1 Cans of beer per week   Comment: occasional     Social History   Substance and Sexual Activity  Drug Use Never    Social  History   Socioeconomic History   Marital status: Single    Spouse name: Not on file   Number of children: 1   Years of education: Not on file   Highest education level: Not on file  Occupational History   Occupation: Unemployed  Tobacco Use   Smoking status: Never   Smokeless tobacco: Never  Vaping Use   Vaping Use: Never used  Substance and Sexual Activity   Alcohol use: Not Currently    Alcohol/week: 1.0 standard drink    Types: 1 Cans of beer per week    Comment: occasional   Drug use: Never   Sexual activity: Never  Other Topics Concern   Not on file  Social History Narrative   ** Merged History Encounter **       Pt lives with mother and other relatives.  Currently unemployed.  [place] outpatient psychiatry services through [facility].   Social Determinants of Health   Financial Resource Strain: Not on file  Food Insecurity: Not on file  Transportation Needs: Not on file  Physical Activity: Not on file  Stress: Not on file  Social Connections: Not on file   Additional Social History:   Sleep: Per above  Appetite:  Per above  Current Medications: Current Facility-Administered Medications  Medication Dose Route Frequency Provider Last Rate Last Admin   acetaminophen (TYLENOL) tablet 650 mg  650 mg Oral Q6H PRN Ethelene Hal, NP   650 mg at 02/05/21 0810   alum & mag  hydroxide-simeth (MAALOX/MYLANTA) 200-200-20 MG/5ML suspension 30 mL  30 mL Oral Q4H PRN Ethelene Hal, NP       benztropine (COGENTIN) tablet 1 mg  1 mg Oral QHS Ethelene Hal, NP   1 mg at 02/04/21 2152   divalproex (DEPAKOTE) DR tablet 250 mg  250 mg Oral Q12H Ethelene Hal, NP   250 mg at 02/05/21 [id number]   haloperidol (HALDOL) tablet 5 mg  5 mg Oral Daily Ethelene Hal, NP   5 mg at 02/05/21 0810   hydrOXYzine (ATARAX) tablet 25 mg  25 mg Oral TID PRN Ethelene Hal, NP   25 mg at 02/03/21 2132   OLANZapine zydis (ZYPREXA) disintegrating tablet 5 mg  5  mg Oral Q8H PRN Merrily Brittle, DO       And   LORazepam (ATIVAN) tablet 1 mg  1 mg Oral PRN Merrily Brittle, DO       And   ziprasidone (GEODON) injection 20 mg  20 mg Intramuscular PRN Merrily Brittle, DO       magnesium hydroxide (MILK OF MAGNESIA) suspension 30 mL  30 mL Oral Daily PRN Ethelene Hal, NP       traZODone (DESYREL) tablet 50 mg  50 mg Oral QHS,MR X 1 Ethelene Hal, NP   50 mg at 02/04/21 2152    Lab Results:  No results found for this or any previous visit (from the past 79 hour(s)).   Blood Alcohol level:  Lab Results  Component Value Date   ETH <10 02/02/2021   ETH <10 [id number]    Metabolic Disorder Labs: Lab Results  Component Value Date   HGBA1C 6.0 (H) 02/03/2021   MPG 126 02/03/2021   MPG 114 02/02/2021   Lab Results  Component Value Date   PROLACTIN 23.4 (H) 11/28/2018   Lab Results  Component Value Date   CHOL 131 01/30/2021   TRIG 104 01/30/2021   HDL 30 (L) 01/30/2021   CHOLHDL 4.4 01/30/2021   VLDL 21 01/30/2021   LDLCALC 80 01/30/2021   LDLCALC 69 08/25/2019    Physical Findings: AIMS:  Facial and Oral Movements Muscles of Facial Expression: None, normal Lips and Perioral Area: None, normal Jaw: None, normal Tongue: None, normal, Extremity Movements Upper (arms, wrists, hands, fingers): None, normal Lower (legs, knees, ankles, toes): None, normal,  Trunk Movements Neck, shoulders, hips: None, normal,  Overall Severity Severity of abnormal movements (highest score from questions above): None, normal Incapacitation due to abnormal movements: None, normal Patient's awareness of abnormal movements (rate only patient's report): No Awareness,  Dental Status Current problems with teeth and/or dentures?: No Does patient usually wear dentures?: No  CIWA:    COWS:     Musculoskeletal: Strength & Muscle Tone: within normal limits Gait & Station: normal Patient leans: N/A   02/04/21 0854  Presentation  General  Appearance Disheveled (Hair in front of face)  Eye Contact Poor (Down cast gaze)  Speech Clear and Coherent;Slow;Blocked;Slurred (Non-spontaneous)  Speech Volume Decreased  Handedness Right  Mood and Affect  Mood Dysphoric ("tired")  Affect Appropriate;Blunt  Thought Processes  Thought Process Irrevelant;Goal Directed  Descriptions of Associations Circumstantial  Orientation Other (comment) (Unable to assess due to psychosis)  Thought Content Paranoid Ideation;Delusions;Rumination (Denied SI/HI. Admitted to [facility] - he, she, he, she, he)  Has patient ever had a diagnosis of schizophrenia or schizoaffective disorder in the past? Yes  Duration of Psychotic Symptoms Greater than six months  Hallucinations Auditory  Description of Auditory Hallucinations "hearing his. her, his, her, and they are people that I know"  Ideas of Reference Delusions;Paranoia;Percusatory  Suicidal Thoughts No  Homicidal Thoughts No  Sensorium  Memory Immediate Fair;Remote Poor;Recent Fair  Judgment Fair  Insight Lacking  Executive Functions  Concentration Fair  Attention Span Poor  Recall Poor  Fund of Knowledge Fair  Language Fair  Psychomotor Activity  Psychomotor Activity Decreased  Assets  Assets Leisure Time;Physical Health;Talents/Skills  Sleep  Sleep Fair   Physical Exam: Vitals:   02/03/21 2122 02/04/21 0614 02/04/21 0615 02/04/21 1621  BP: 109/89 113/80 116/82 (!) 101/57  Pulse: (!) 107 65 94 68  Temp:  (!) 97.5 F (36.4 C)    Resp:      Height:      Weight:      SpO2: 98%  98%   TempSrc:  Oral    BMI (Calculated):        Physical Exam Vitals and nursing note reviewed.  Constitutional:      General: He is awake. He is not in acute distress.    Appearance: He is not ill-appearing or diaphoretic.  HENT:     Head: Normocephalic.  Pulmonary:     Effort: Pulmonary effort is normal.  Neurological:     General: No focal deficit present.     Mental Status: He is alert.   Psychiatric:        Behavior: Behavior is cooperative.    Review of Systems  Respiratory:  Negative for shortness of breath.   Cardiovascular:  Negative for chest pain.  Gastrointestinal:  Negative for nausea and vomiting.  Neurological:  Negative for dizziness and headaches.    Treatment Plan & Assessment Summary: Principal Problem:   Schizoaffective disorder, bipolar type [facility]) Active Problems:   Homeless  Owais Turowski is a 36 y.o. male schizophrenia, HI, who presented to [facility] ED for acute psychosis, agitation and concern for HI then admitted involuntarily to [facility] for treatment of schizophrenia exacerbated by poor therapeutic alliance. [place] Day 3   PLAN: Schizophrenia-paranoid type Received Haldol Decanoate 100 mg IM (01/29/2021), next dose per below Continued Haldol 5 mg PO daily Continued Cogentin 1 mg qHS   Depakote DR 250 mg q12hrs  Continued Trazadone 50 mg qHS  Prediabetes A1c 6 Recommended follow-up with PCP after discharge Will encourage lifestyle modification Will consider metformin if patient is amenable, however unsure of med adherence after dc given hx and disease  Dispo: Patient, No Pcp Per (Inactive)  Dispo: homeless shelter (Patient was living with father prior to admission) Pending clinical improvement IVC'd by father Alisa Appleyard [phone] Patient's next Haldol Decanoate 1000 mg IM injection is due on 02/24/2021  Safety, monitoring and disposition planning: The patient was seen and evaluated on the unit.  The patient's chart was reviewed and nursing notes were reviewed.  The patient's case was discussed in multidisciplinary team meeting.  Plan and drug side effects were discussed with patient who was amendable. Social work and case management to assist with discharge planning and identification of hospital follow-up needs prior to discharge. Discharge Concerns: Need to establish a safety plan; Medication compliance and  effectiveness Discharge Goals: Return home with outpatient referrals for mental health follow-up including medication management/psychotherapy Safety and Monitoring: Involuntary status at inpatient psychiatric unit for safety, stabilization and treatment Daily contact with patient to assess and evaluate symptoms and progress in treatment and medical management Patient's case to be discussed in multi-disciplinary team meeting Observation Level:  Per above Vital signs:  q12 hours Precautions: suicide   Signed: Lindell Spar, NP, pmhnp, fnp-bc 02/05/2021, 4:34 PM  Patient ID: Wesley Peterson, male   DOB: 1985-02-07, 36 y.o.   MRN: [id number]

## 2021-02-06 DIAGNOSIS — F250 Schizoaffective disorder, bipolar type: Secondary | ICD-10-CM | POA: Diagnosis not present

## 2021-02-06 MED ORDER — HALOPERIDOL 5 MG PO TABS
5.0000 mg | ORAL_TABLET | Freq: Two times a day (BID) | ORAL | Status: DC
  Administered 2021-02-06 – 2021-02-07 (×2): 5 mg via ORAL
  Filled 2021-02-06 (×7): qty 1

## 2021-02-06 NOTE — Progress Notes (Signed)
Pt denies SI/HI/VH but endorses hearing voices saying, "his, hers" and verbally agrees to approach staff if these become apparent or before harming themselves/others. Rates depression 0/10. Rates anxiety 0/10. Rates pain 10/10 in shoulders. Pt given PRNs. Pt acted suspicious and was very guarded. Pt seemed to be very out of it. Pt was starring off behind RN when at med window. Pt stated that his family made him "out of it." Pt stated that he had a good day up until he called his family. Pt did not go into detail except that he wanted to apologize to them. Pt has been to himself all day. Scheduled medications administered to pt, per MD orders. RN provided support and encouragement to pt. Q15 min safety checks implemented and continued. Pt safe on the unit. RN will continue to monitor and intervene as needed.   02/06/21 0754  Psych Admission Type (Psych Patients Only)  Admission Status Involuntary  Psychosocial Assessment  Patient Complaints Other (Comment) (AH, pain)  Eye Contact Brief;Suspiciousness  Facial Expression Flat;Anxious  Affect Blunted;Anxious  Speech Soft;Logical/coherent  Interaction Cautious;Guarded;Forwards little;Minimal  Motor Activity Slow  Appearance/Hygiene Disheveled  Behavior Characteristics Anxious;Guarded;Cooperative  Mood Suspicious;Sad  Thought Process  Coherency Blocking  Content Paranoia  Delusions Paranoid  Perception Hallucinations  Hallucination Auditory  Judgment Poor  Confusion None  Danger to Self  Current suicidal ideation? Denies  Danger to Others  Danger to Others None reported or observed

## 2021-02-06 NOTE — Progress Notes (Addendum)
[facility] MD Progress Note  02/06/2021 3:57 PM Wesley Peterson  MRN: [id number] CC: acute psychosis  Subjective: Wesley Peterson is a 36 y.o. male with PPHx schizophrenia, HI, who presented to [facility] ED for acute psychosis, agitation and concern for HI then admitted involuntarily to [facility] for treatment of schizophrenia exacerbated by poor therapeutic alliance.  [place] Day 4   Interim History: Patient was evaluated today at patient room on [place].  Evaluation was limited due to patient psychosis, he was noted to be staring off in the room, mumbling to himself and smiling. He was initially seen laying in the dark, midmorning.  Patient stated that his sleep and appetite are improving.  However stated that the [place] appeared unchanged.  Continue to endorse tiredness, although feeling alright last night. Patient denied medication side effects and is tolerating it well. Patient denied SI/HI/VH, delusions, and contracted to safety on the unit. Patient was grossly responding to internal/external stimuli during encounter. Patient had no other concerns.  Diagnosis:  Principal Problem:   Schizoaffective disorder, bipolar type ([place]) Active Problems:   Homeless   Past Psychiatric History: See H&P  Past Medical History:  Past Medical History:  Diagnosis Date   Schizophrenia ([place])    Schizophrenia, paranoid type ([place]) 02/02/2021   History reviewed. No pertinent surgical history. Family History: History reviewed. No pertinent family history. Family Psychiatric  History: See H&P Social History:  Social History   Substance and Sexual Activity  Alcohol Use Not Currently   Alcohol/week: 1.0 standard drink   Types: 1 Cans of beer per week   Comment: occasional     Social History   Substance and Sexual Activity  Drug Use Never    Social History   Socioeconomic History   Marital status: Single    Spouse name: Not on file   Number of children: 1   Years of education: Not on file   Highest  education level: Not on file  Occupational History   Occupation: Unemployed  Tobacco Use   Smoking status: Never   Smokeless tobacco: Never  Vaping Use   Vaping Use: Never used  Substance and Sexual Activity   Alcohol use: Not Currently    Alcohol/week: 1.0 standard drink    Types: 1 Cans of beer per week    Comment: occasional   Drug use: Never   Sexual activity: Never  Other Topics Concern   Not on file  Social History Narrative   ** Merged History Encounter **       Pt lives with mother and other relatives.  Currently unemployed.  [place] outpatient psychiatry services through [facility].   Social Determinants of Health   Financial Resource Strain: Not on file  Food Insecurity: Not on file  Transportation Needs: Not on file  Physical Activity: Not on file  Stress: Not on file  Social Connections: Not on file   Additional Social History:                         Sleep: Per above  Appetite:  Per above  Current Medications: Current Facility-Administered Medications  Medication Dose Route Frequency Provider Last Rate Last Admin   acetaminophen (TYLENOL) tablet 650 mg  650 mg Oral Q6H PRN Ethelene Hal, NP   650 mg at 02/06/21 0754   alum & mag hydroxide-simeth (MAALOX/MYLANTA) 200-200-20 MG/5ML suspension 30 mL  30 mL Oral Q4H PRN Ethelene Hal, NP  benztropine (COGENTIN) tablet 1 mg  1 mg Oral QHS Ethelene Hal, NP   1 mg at 02/05/21 2112   divalproex (DEPAKOTE) DR tablet 250 mg  250 mg Oral Q12H Ethelene Hal, NP   250 mg at 02/06/21 0753   haloperidol (HALDOL) tablet 5 mg  5 mg Oral BID Merrily Brittle, DO       hydrOXYzine (ATARAX) tablet 25 mg  25 mg Oral TID PRN Ethelene Hal, NP   25 mg at 02/03/21 2132   OLANZapine zydis (ZYPREXA) disintegrating tablet 5 mg  5 mg Oral Q8H PRN Merrily Brittle, DO       And   LORazepam (ATIVAN) tablet 1 mg  1 mg Oral PRN Merrily Brittle, DO       And   ziprasidone (GEODON) injection  20 mg  20 mg Intramuscular PRN Merrily Brittle, DO       magnesium hydroxide (MILK OF MAGNESIA) suspension 30 mL  30 mL Oral Daily PRN Ethelene Hal, NP       traZODone (DESYREL) tablet 50 mg  50 mg Oral QHS,MR X 1 Ethelene Hal, NP   50 mg at 02/05/21 2112    Lab Results: No results found for this or any previous visit (from the past 48 hour(s)).  Blood Alcohol level:  Lab Results  Component Value Date   ETH <10 02/02/2021   ETH <10 [id number]    Metabolic Disorder Labs: Lab Results  Component Value Date   HGBA1C 6.0 (H) 02/03/2021   MPG 126 02/03/2021   MPG 114 02/02/2021   Lab Results  Component Value Date   PROLACTIN 23.4 (H) 11/28/2018   Lab Results  Component Value Date   CHOL 131 01/30/2021   TRIG 104 01/30/2021   HDL 30 (L) 01/30/2021   CHOLHDL 4.4 01/30/2021   VLDL 21 01/30/2021   LDLCALC 80 01/30/2021   LDLCALC 69 08/25/2019    Physical Findings: AIMS:  Facial and Oral Movements Muscles of Facial Expression: None, normal Lips and Perioral Area: None, normal Jaw: None, normal Tongue: None, normal, Extremity Movements Upper (arms, wrists, hands, fingers): None, normal Lower (legs, knees, ankles, toes): None, normal,  Trunk Movements Neck, shoulders, hips: None, normal,  Overall Severity Severity of abnormal movements (highest score from questions above): None, normal Incapacitation due to abnormal movements: None, normal Patient's awareness of abnormal movements (rate only patient's report): No Awareness,  Dental Status Current problems with teeth and/or dentures?: No Does patient usually wear dentures?: No  CIWA:    COWS:     Musculoskeletal: Strength & Muscle Tone: within normal limits Gait & Station: normal Patient leans: N/A  Psychiatric Specialty Exam:  Presentation  General Appearance: Disheveled; Bizarre  Eye Contact:Minimal; Fleeting (Will look towards a side)  Speech:Blocked; Garbled; Slow  Speech  Volume:Decreased  Handedness:Right   Mood and Affect  Mood:Dysphoric  Affect:Non-Congruent; Restricted (Inappropriate smiling)   Thought Process  Thought Processes:Disorganized; Irrevelant  Descriptions of Associations:Circumstantial   Orientation:Other (comment) (Unable to assess due to psychosis)   Thought Content:Paranoid Ideation; Rumination (Reported AH of "he, she, he, she".  Denied SI/HI/VH.  Patient appeared to be internally preoccupied, as he was mumbling to himself and looking away during interview)   History of Schizophrenia/Schizoaffective disorder:Yes  Duration of Psychotic Symptoms:Greater than six months  Hallucinations:Hallucinations: Auditory Description of Auditory Hallucinations: "hearing his. her, his, her"  Ideas of Reference:Percusatory; Paranoia; Delusions   Suicidal Thoughts:Suicidal Thoughts: No  Homicidal Thoughts:Homicidal Thoughts: No   Sensorium  Memory:Immediate Fair; Recent Fair; Remote Poor  Judgment:Fair  Insight:Lacking   Executive Functions  Concentration:Fair  Attention Span:Fair  [place]   Psychomotor Activity  Psychomotor Activity:Psychomotor Activity: Decreased   Assets  Assets:Leisure Time   Sleep  Sleep:6 hours   Physical Exam: Vitals:   02/04/21 1621 02/05/21 2011 02/06/21 0628 02/06/21 0629  BP: (!) 101/57 128/82 124/84 124/79  Pulse: 68 (!) 101 79 87  Temp:   98 F (36.7 C)   Resp:      Height:      Weight:      SpO2:  99% 99%   TempSrc:   Oral   BMI (Calculated):         Physical Exam Vitals and nursing note reviewed.  Constitutional:      General: He is awake. He is not in acute distress.    Appearance: He is not ill-appearing or diaphoretic.  HENT:     Head: Normocephalic.  Pulmonary:     Effort: Pulmonary effort is normal.  Neurological:     General: No focal deficit present.     Mental Status: He is alert.  Psychiatric:         Behavior: Behavior is cooperative.    Review of Systems  Respiratory:  Negative for shortness of breath.   Cardiovascular:  Negative for chest pain.  Gastrointestinal:  Negative for nausea and vomiting.  Neurological:  Negative for dizziness and headaches.    Treatment Plan & Assessment Summary: Principal Problem:   Schizoaffective disorder, bipolar type [facility]) Active Problems:   Homeless   Wesley Peterson is a 36 y.o. male with PPHx schizophrenia, HI, who presented to [facility] ED for acute psychosis, agitation and concern for HI then admitted involuntarily to [facility] for treatment of schizophrenia exacerbated by poor therapeutic alliance.  [place] Day 4   PLAN: Schizophrenia-paranoid type Patient presented with auditory hallucinations, agitation, and continue to be noted internally preoccupied during exam.  Unclear why, patient had been receiving Adderall outpatient, from [place]. Received Haldol Decanoate 100 mg IM (01/29/2021), next dose per below Increased Haldol 5 mg PO daily to BID Continued Cogentin 1 mg qHS   Depakote DR 250 mg q12hrs  Continued Trazadone 50 mg qHS   Prediabetes A1c 6 Recommended follow-up with PCP after discharge Will encourage lifestyle modification Will consider metformin if patient is amenable, however unsure of med adherence after dc given hx and disease  Dispo: Patient, No Pcp Per (Inactive)  Dispo: homeless shelter (Patient was living with father prior to admission) Pending clinical improvement IVC'd by father Sriram Rhinehart [phone] Patient's next Haldol Decanoate 100 mg IM injection is due on 02/24/2021  Safety, monitoring and disposition planning: The patient was seen and evaluated on the unit.  The patient's chart was reviewed and nursing notes were reviewed.  The patient's case was discussed in multidisciplinary team meeting.  Plan and drug side effects were discussed with patient who was amendable. Social work and case  management to assist with discharge planning and identification of hospital follow-up needs prior to discharge. Discharge Concerns: Need to establish a safety plan; Medication compliance and effectiveness Discharge Goals: Return home with outpatient referrals for mental health follow-up including medication management/psychotherapy Safety and Monitoring: Involuntary status at inpatient psychiatric unit for safety, stabilization and treatment Daily contact with patient to assess and evaluate symptoms and progress in treatment and medical management Patient's case to be discussed in multi-disciplinary team meeting Observation Level: Per above  Vital signs:  q12 hours Precautions: suicide   Signed: Merrily Brittle, DO Psychiatry Resident, PGY-1 [facility] 02/06/2021, 3:57 PM    I have read above and discuss with resident. Edits made to reflect my own interview and interactions with patient today. I had to correct the dose of the IM decanoate due to digit error (one too many 0s).

## 2021-02-06 NOTE — [facility] Group Notes (Signed)
Adult Psychoeducational Group Not °Date:  02/06/2021 °Time:  0900-1045 °Group Topic/Focus: PROGRESSIVE RELAXATION. A group where deep breathing is taught and tensing and relaxation muscle groups is used. Imagery is used as well.  Pts are asked to imagine 3 pillars that hold them up when they are not able to hold themselves up. ° °Participation Level:  did not attend ° °Wesley Peterson A ° °

## 2021-02-06 NOTE — Progress Notes (Signed)
°   02/06/21 0600  Sleep  Number of Hours 6

## 2021-02-06 NOTE — Progress Notes (Signed)
The patient rated his day as an 8 out of 10. He states that he got out of bed more and spent more time watching tv with his peers. His goal for tomorrow is to "stay motivated".

## 2021-02-06 NOTE — Group Note (Signed)
[facility] LCSW Group Therapy Note ° °02/06/2021  10:00-11:00AM ° °Type of Therapy and Topic:  Group Therapy:  Building Supports ° °Participation Level:  Did Not Attend  ° °Description of Group:  Patients in this group were introduced to the idea of adding a variety of healthy supports to address the various needs in their lives.  Different types of support were defined and described, and patients were asked to act out what each type could be.  Patients discussed what additional healthy supports could be helpful in their recovery and wellness after discharge in order to prevent future hospitalizations.   An emphasis was placed on following up with the discharge plan when they leave the hospital in order to continue becoming healthier and happier.  Two songs were played during group to help further patients' understanding. ° °Therapeutic Goals: °1)  demonstrate the importance of adding supports ° 2)  discuss 4 definitions of support ° 3)  identify the patient's current level of healthy support and  ° 4)  elicit commitments to add one healthy support °  °Summary of Patient Progress:  The patient was invited to group, did not attend. ° °Therapeutic Modalities:   °Motivational Interviewing °Brief Solution-Focused Therapy ° °Akua Blethen J Grossman-Orr  ° °   °

## 2021-02-07 MED ORDER — ARIPIPRAZOLE 5 MG PO TABS
5.0000 mg | ORAL_TABLET | Freq: Every day | ORAL | Status: DC
  Administered 2021-02-08 – 2021-02-09 (×2): 5 mg via ORAL
  Filled 2021-02-07 (×5): qty 1

## 2021-02-07 MED ORDER — TRAZODONE HCL 50 MG PO TABS
50.0000 mg | ORAL_TABLET | Freq: Every evening | ORAL | Status: DC | PRN
  Administered 2021-02-07 (×2): 50 mg via ORAL
  Filled 2021-02-07 (×7): qty 1

## 2021-02-07 NOTE — [facility] Group Notes (Signed)
Pt did not attend 1400 group. ?

## 2021-02-07 NOTE — Progress Notes (Signed)
D- Patient alert and oriented x4. Patient in stable mood. Pt still endorses auditory hallucinations. Denies SI, HI, VH. Pt interacting positively with staff and peers. Pt complained of some insomnia and requested second dose off trazodone.    A- Scheduled medications administered to patient, per MD orders. PRN medications given for insomnia. Routine safety checks conducted every 15 minutes.  Patient informed to notify staff with problems or concerns.   R- Patient compliant with medications and verbalized understanding treatment plan.     02/06/21 2130  Psych Admission Type (Psych Patients Only)  Admission Status Involuntary  Psychosocial Assessment  Patient Complaints Other (Comment);Suspiciousness;Anxiety (AH)  Eye Contact Brief;Suspiciousness  Facial Expression Flat;Anxious  Affect Blunted;Anxious  Speech Soft;Logical/coherent  Interaction Cautious;Guarded;Forwards little  Motor Activity Slow  Appearance/Hygiene Disheveled  Behavior Characteristics Cooperative;Anxious;Guarded  Mood Suspicious  Thought Process  Coherency Blocking  Content Paranoia  Delusions Paranoid  Perception Hallucinations  Hallucination Auditory  Judgment Poor  Confusion None  Danger to Self  Current suicidal ideation? Denies  Danger to Others  Danger to Others None reported or observed

## 2021-02-07 NOTE — Group Note (Signed)
Date:  02/07/2021 Time:  10:25 AM  Group Topic/Focus:  Goals Group:   The focus of this group is to help patients establish daily goals to achieve during treatment and discuss how the patient can incorporate goal setting into their daily lives to aide in recovery. Orientation:   The focus of this group is to educate the patient on the purpose and policies of crisis stabilization and provide a format to answer questions about their admission.  The group details unit policies and expectations of patients while admitted.    Participation Level:  Did Not Attend  Participation Quality:   Affect:    Cognitive:    Insight:  Engagement in Group:    Modes of Intervention:    Additional Comments:    Jaquita Rector 02/07/2021, 10:25 AM

## 2021-02-07 NOTE — Progress Notes (Signed)
Pt denies SI/HI/AVH and verbally agrees to approach staff if these become apparent or before harming themselves/others. Rates depression 0/10. Rates anxiety 7/10. Rates pain 10/10. Pt has been isolative in his room. Pt will not go to meals nor groups. Scheduled medications administered to pt, per MD orders. RN provided support and encouragement to pt. Q15 min safety checks implemented and continued. Pt safe on the unit. RN will continue to monitor and intervene as needed.   02/07/21 [id number]  Psych Admission Type (Psych Patients Only)  Admission Status Involuntary  Psychosocial Assessment  Patient Complaints Anxiety;Other (Comment) (AH, pain)  Eye Contact Brief;Suspiciousness  Facial Expression Fixed smile;Anxious;Sad  Affect Blunted;Anxious  Speech Logical/coherent;Soft  Interaction Minimal;Guarded;Forwards little;Cautious  Motor Activity Slow  Appearance/Hygiene Production designer, theatre/television/film Cooperative;Anxious;Guarded  Mood Anxious;Empty;Suspicious  Thought Process  Coherency Blocking  Content Paranoia  Delusions Paranoid  Perception Hallucinations  Hallucination Auditory  Judgment Poor  Confusion None  Danger to Self  Current suicidal ideation? Denies  Danger to Others  Danger to Others None reported or observed

## 2021-02-07 NOTE — Group Note (Signed)
Date:  02/07/2021 °Time:  11:09 AM ° °Group Topic/Focus:  °Orientation:   The focus of this group is to educate the patient on the purpose and policies of crisis stabilization and provide a format to answer questions about their admission.  The group details unit policies and expectations of patients while admitted. ° ° ° °Participation Level:  Did Not Attend ° °Participation Quality:   ° °Affect:   ° °Cognitive:   ° °Insight:  ° °Engagement in Group:   ° °Modes of Intervention:   ° °Additional Comments:   ° °Loghan Subia Lashawn Itzy Adler °02/07/2021, 11:09 AM ° °

## 2021-02-07 NOTE — Group Note (Signed)
Recreation Therapy Group Note   Group Topic:Stress Management  Group Date: 02/07/2021 Start Time: 0935 End Time: 0952 Facilitators: Caroll Rancher, [place] Location: [address] Day[address]   Goal Area(s) Addresses:  Patient will identify positive stress management techniques. Patient will identify benefits of using stress management post d/c.  Group Description:  Meditation.  LRT played a meditation that focused on taking good intentions and positive thoughts into your day.  Patients were to listen and follow along as the meditation played to fully engage in the activity.   Affect/Mood: N/A   Participation Level: Did not attend    Clinical Observations/Individualized Feedback:     Plan: Continue to engage patient in RT group sessions 2-3x/week.   Caroll Rancher, Antonietta Jewel 02/07/2021 12:31 PM

## 2021-02-07 NOTE — Group Note (Deleted)
Date:  02/07/2021 °Time:  10:58 AM ° °Group Topic/Focus:  °Goals Group:   The focus of this group is to help patients establish daily goals to achieve during treatment and discuss how the patient can incorporate goal setting into their daily lives to aide in recovery. °Orientation:   The focus of this group is to educate the patient on the purpose and policies of crisis stabilization and provide a format to answer questions about their admission.  The group details unit policies and expectations of patients while admitted. ° ° ° ° °Participation Level:  {[facility] PARTICIPATION LEVEL:22264} ° °Participation Quality:  {[facility] PARTICIPATION QUALITY:22265} ° °Affect:  {[facility] AFFECT:22266} ° °Cognitive:  {[facility] COGNITIVE:22267} ° °Insight: {[facility] Insight2:20797} ° °Engagement in Group:  {[facility] ENGAGEMENT IN GROUP:22268} ° °Modes of Intervention:  {[facility] MODES OF INTERVENTION:22269} ° °Additional Comments:  *** ° °[facility] Lashawn Porche Steinberger °02/07/2021, 10:58 AM ° °

## 2021-02-07 NOTE — Progress Notes (Signed)
°   02/07/21 2141  Psych Admission Type (Psych Patients Only)  Admission Status Involuntary  Psychosocial Assessment  Patient Complaints None  Eye Contact Brief  Facial Expression Flat  Affect Appropriate to circumstance;Blunted  Speech Logical/coherent;Soft  Interaction Guarded  Motor Activity Slow  Appearance/Hygiene Disheveled  Behavior Characteristics Cooperative;Appropriate to situation  Mood Anxious  Thought Process  Coherency Blocking  Content Paranoia  Delusions Paranoid  Perception Hallucinations  Hallucination Auditory  Judgment Poor  Confusion None  Danger to Self  Current suicidal ideation? Denies  Danger to Others  Danger to Others None reported or observed

## 2021-02-07 NOTE — Group Note (Addendum)
LCSW Group Therapy Note   Group Date: 02/07/2021 Start Time: 1300 End Time: 1400  Type of Therapy and Topic:  Group Therapy:  Healthy and Unhealthy Supports   Participation Level: Did Not Attend    Description of Group:  Patients in this group were introduced to the idea of adding a variety of healthy supports to address the various needs in their lives. Patients discussed what additional healthy supports could be helpful in their recovery and wellness after discharge in order to prevent future hospitalizations.   An emphasis was placed on using counselor, doctor, therapy groups, 12-step groups, and problem-specific support groups to expand supports.  They also worked as a group on developing a specific plan for several patients to deal with unhealthy supports through boundary-setting, psychoeducation with loved ones, and even termination of relationships.   Therapeutic Goals:               1)  discuss importance of adding supports to stay well once out of the hospital             2)  compare healthy versus unhealthy supports and identify some examples of each             3)  generate ideas and descriptions of healthy supports that can be added             4)  offer mutual support about how to address unhealthy supports             5)  encourage active participation in and adherence to discharge plan               Summary of Patient Progress:  Did not attend  Therapeutic Modalities:   Motivational Interviewing Brief Solution-Focused Therapy  Aram Beecham, LCSWA 02/07/2021  1:47 PM

## 2021-02-07 NOTE — Progress Notes (Signed)
°   02/07/21 0600  Sleep  Number of Hours 5

## 2021-02-07 NOTE — Progress Notes (Signed)
[facility] MD Progress Note  02/07/2021 3:10 PM [facility]  MRN: [id number] CC: acute psychosis  Subjective: [facility] is a 36 y.o. male with PPHx schizophrenia, HI, who presented to [facility] ED for acute psychosis, agitation and concern for HI then admitted involuntarily to [facility] for treatment of schizophrenia exacerbated by poor therapeutic alliance.  [facility] Day 5   Interim History: Patient was evaluated today at patient room on [address]. Evaluation was limited due to patient psychosis, patient continued to be anergic, mainly staying in bed all day. Patient mumbled to himself and laughed during evaluation, concerning for possible ongoing internal preoccupation despite increasing haldol dose.  He continued to report a.m. fatigue that his AH is still unchanged.  He also brought up concerns of shoulder stiffness, but denied involuntary movements, difficulty swallowing, tremors.  He reported stable appetite and sleep, although his energy level is still decreased.  In response to how his mood was, patient stated that "it comes and go", patient declined to elaborate further.  He denied anxiety, depression, frustration. Patient denied SI/HI/VH, delusions, and contracted to safety on the unit. Patient was grossly responding to internal/external stimuli during encounter. Patient had no other concerns.  Diagnosis:  Principal Problem:   Schizoaffective disorder, bipolar type ([place]) Active Problems:   Homeless   Past Psychiatric History: See H&P  Past Medical History:  Past Medical History:  Diagnosis Date   Schizophrenia ([place])    Schizophrenia, paranoid type ([place]) 02/02/2021   History reviewed. No pertinent surgical history. Family History: History reviewed. No pertinent family history. Family Psychiatric  History: See H&P Social History:  Social History   Substance and Sexual Activity  Alcohol Use Not Currently   Alcohol/week: 1.0 standard drink   Types: 1 Cans of beer per week    Comment: occasional     Social History   Substance and Sexual Activity  Drug Use Never    Social History   Socioeconomic History   Marital status: Single    Spouse name: Not on file   Number of children: 1   Years of education: Not on file   Highest education level: Not on file  Occupational History   Occupation: Unemployed  Tobacco Use   Smoking status: Never   Smokeless tobacco: Never  Vaping Use   Vaping Use: Never used  Substance and Sexual Activity   Alcohol use: Not Currently    Alcohol/week: 1.0 standard drink    Types: 1 Cans of beer per week    Comment: occasional   Drug use: Never   Sexual activity: Never  Other Topics Concern   Not on file  Social History Narrative   ** Merged History Encounter **       Pt lives with mother and other relatives.  Currently unemployed.  [facility] outpatient [facility]ry [facility] through [facility].   Social Determinants of Health   Financial Resource Strain: Not on file  Food Insecurity: Not on file  Transportation Needs: Not on file  Physical Activity: Not on file  Stress: Not on file  Social Connections: Not on file   Additional Social History:                         Sleep: Per above  Appetite:  Per above  Current Medications: Current Facility-Administered Medications  Medication Dose Route Frequency Provider Last Rate Last Admin   acetaminophen (TYLENOL) tablet 650 mg  650 mg Oral Q6H PRN Ethelene Hal, NP  650 mg at 02/07/21 0812   alum & mag hydroxide-simeth (MAALOX/MYLANTA) 200-200-20 MG/5ML suspension 30 mL  30 mL Oral Q4H PRN Ethelene Hal, NP       [START ON 02/08/2021] ARIPiprazole (ABILIFY) tablet 5 mg  5 mg Oral Daily Merrily Brittle, DO       benztropine (COGENTIN) tablet 1 mg  1 mg Oral QHS Ethelene Hal, NP   1 mg at 02/06/21 2125   divalproex (DEPAKOTE) DR tablet 250 mg  250 mg Oral Q12H Ethelene Hal, NP   250 mg at 02/07/21 [id number]   hydrOXYzine (ATARAX) tablet 25  mg  25 mg Oral TID PRN Ethelene Hal, NP   25 mg at 02/07/21 0812   OLANZapine zydis (ZYPREXA) disintegrating tablet 5 mg  5 mg Oral Q8H PRN Merrily Brittle, DO       And   LORazepam (ATIVAN) tablet 1 mg  1 mg Oral PRN Merrily Brittle, DO       And   ziprasidone (GEODON) injection 20 mg  20 mg Intramuscular PRN Merrily Brittle, DO       magnesium hydroxide (MILK OF MAGNESIA) suspension 30 mL  30 mL Oral Daily PRN Ethelene Hal, NP       traZODone (DESYREL) tablet 50 mg  50 mg Oral QHS,MR X 1 Merrily Brittle, DO        Lab Results: No results found for this or any previous visit (from the past 48 hour(s)).  Blood Alcohol level:  Lab Results  Component Value Date   ETH <10 02/02/2021   ETH <10 [id number]    Metabolic Disorder Labs: Lab Results  Component Value Date   HGBA1C 6.0 (H) 02/03/2021   MPG 126 02/03/2021   MPG 114 02/02/2021   Lab Results  Component Value Date   PROLACTIN 23.4 (H) 11/28/2018   Lab Results  Component Value Date   CHOL 131 01/30/2021   TRIG 104 01/30/2021   HDL 30 (L) 01/30/2021   CHOLHDL 4.4 01/30/2021   VLDL 21 01/30/2021   LDLCALC 80 01/30/2021   LDLCALC 69 08/25/2019    Physical Findings: AIMS:  Facial and Oral Movements Muscles of Facial Expression: None, normal Lips and Perioral Area: None, normal Jaw: None, normal Tongue: None, normal, Extremity Movements Upper (arms, wrists, hands, fingers): None, normal Lower (legs, knees, ankles, toes): None, normal,  Trunk Movements Neck, shoulders, hips: None, normal,  Overall Severity Severity of abnormal movements (highest score from questions above): None, normal Incapacitation due to abnormal movements: None, normal Patient's awareness of abnormal movements (rate only patient's report): No Awareness,  Dental Status Current problems with teeth and/or dentures?: No Does patient usually wear dentures?: No  CIWA:    COWS:     Musculoskeletal: Strength & Muscle Tone: within  normal limits Gait & Station: normal Patient leans: N/A  Psychiatric Specialty Exam:  Presentation  General Appearance: Disheveled; Bizarre  Eye Contact:Minimal; Fleeting (Will look towards a side)  Speech:Blocked; Garbled; Slow  Speech Volume:Decreased  Handedness:Right   Mood and Affect  Mood:Dysphoric  Affect:Non-Congruent; Restricted (Inappropriate smiling)   Thought Process  Thought Processes:Disorganized; Irrevelant  Descriptions of Associations:Circumstantial   Orientation:Other (comment) (Unable to assess due to psychosis)   Thought Content:Paranoid Ideation; Rumination (Reported AH of "he, she, he, she".  Denied SI/HI/VH.  Patient appeared to be internally preoccupied, as he was mumbling to himself and looking away during interview)   History of Schizophrenia/Schizoaffective disorder:Yes  Duration of Psychotic Symptoms:Greater than six months  Hallucinations:Hallucinations: Auditory Description of Auditory Hallucinations: "hearing his. her, his, her"  Ideas of Reference:Percusatory; Paranoia; Delusions   Suicidal Thoughts:Suicidal Thoughts: No  Homicidal Thoughts:Homicidal Thoughts: No   Sensorium  Memory:Immediate Fair; Recent Fair; Remote Poor  Judgment:Fair  Insight:Lacking   Executive Functions  Concentration:Fair  Attention Span:Fair  [place]   Psychomotor Activity  Psychomotor Activity:Psychomotor Activity: Decreased   Assets  Assets:Leisure Time   Sleep  Sleep:5 hours   Physical Exam: Vitals:   02/06/21 0628 02/06/21 0629 02/06/21 1613 02/07/21 0746  BP: 124/84 124/79 128/69 108/67  Pulse: 79 87 77 64  Temp: 98 F (36.7 C)  99 F (37.2 C) 98.2 F (36.8 C)  Resp:      Height:      Weight:      SpO2: 99%  99% 97%  TempSrc: Oral  Oral Oral  BMI (Calculated):         Physical Exam Vitals and nursing note reviewed.  Constitutional:      General: He is awake. He  is not in acute distress.    Appearance: He is not ill-appearing or diaphoretic.  HENT:     Head: Normocephalic.  Pulmonary:     Effort: Pulmonary effort is normal.  Neurological:     General: No focal deficit present.     Mental Status: He is alert.  Psychiatric:        Behavior: Behavior is cooperative.    Review of Systems  Respiratory:  Negative for shortness of breath.   Cardiovascular:  Negative for chest pain.  Gastrointestinal:  Negative for nausea and vomiting.  Neurological:  Negative for dizziness and headaches.    Treatment Plan & Assessment Summary: Principal Problem:   Schizoaffective disorder, bipolar type [facility]) Active Problems:   Homeless   Worthy Gebre is a 36 y.o. male with PPHx schizophrenia, HI, who presented to [facility] ED for acute psychosis, agitation and concern for HI then admitted involuntarily to [facility] for treatment of schizophrenia exacerbated by poor therapeutic alliance.  [place] Day 5   PLAN: Schizophrenia-paranoid type Patient presented with auditory hallucinations, agitation, and continue to be noted internally preoccupied during exam. Unclear why, patient had been receiving Adderall outpatient, from [place].  After a few days of allowing for Haldol decanoate to reach steady state along with Haldol bridge, that was increased yesterday, patient has not responded and is still possibly psychotic.  We will plan to switch agents completely, given ineffectiveness Received Haldol Decanoate 100 mg IM (01/29/2021), next dose per below Continued Cogentin 1 mg qHS   Depakote DR 250 mg q12hrs  Continued Trazadone 50 mg qHS Discontinued Haldol 5 mg PO BID Started Abilify 5 mg daily   Prediabetes A1c 6 Recommended follow-up with PCP after discharge Will encourage lifestyle modification Will consider metformin if patient is amenable, however unsure of med adherence after dc given hx and disease  Dispo: Patient, No Pcp Per (Inactive)  Dispo:  homeless shelter (Patient was living with father prior to admission) Pending clinical improvement IVC'd by father Callon Herridge [phone] Patient's next Haldol Decanoate 100 mg IM injection is due on 02/24/2021  Safety, monitoring and disposition planning: The patient was seen and evaluated on the unit.  The patient's chart was reviewed and nursing notes were reviewed.  The patient's case was discussed in multidisciplinary team meeting.  Plan and drug side effects were discussed with patient who was amendable. Social work and case management to assist with discharge  planning and identification of [facility] follow-up needs prior to discharge. Discharge Concerns: Need to establish a safety plan; Medication compliance and effectiveness Discharge Goals: Return home with outpatient referrals for mental health follow-up including medication management/psychotherapy Safety and Monitoring: Involuntary status at inpatient psychiatric unit for safety, stabilization and treatment Daily contact with patient to assess and evaluate symptoms and progress in treatment and medical management Patient's case to be discussed in multi-disciplinary team meeting Observation Level: Per above Vital signs:  q12 hours Precautions: suicide   Signed: Merrily Brittle, DO Psychiatry Resident, PGY-1 [place] [facility] 02/07/2021, 3:10 PM

## 2021-02-08 DIAGNOSIS — G2401 Drug induced subacute dyskinesia: Secondary | ICD-10-CM

## 2021-02-08 DIAGNOSIS — F250 Schizoaffective disorder, bipolar type: Secondary | ICD-10-CM | POA: Diagnosis not present

## 2021-02-08 LAB — HEPATIC FUNCTION PANEL
ALT: 14 U/L (ref 0–44)
AST: 22 U/L (ref 15–41)
Albumin: 4.6 g/dL (ref 3.5–5.0)
Alkaline Phosphatase: 59 U/L (ref 38–126)
Bilirubin, Direct: 0.1 mg/dL (ref 0.0–0.2)
Indirect Bilirubin: 0 mg/dL — ABNORMAL LOW (ref 0.3–0.9)
Total Bilirubin: 0.1 mg/dL — ABNORMAL LOW (ref 0.3–1.2)
Total Protein: 8.2 g/dL — ABNORMAL HIGH (ref 6.5–8.1)

## 2021-02-08 MED ORDER — BENZTROPINE MESYLATE 1 MG PO TABS
1.0000 mg | ORAL_TABLET | Freq: Two times a day (BID) | ORAL | Status: DC
  Administered 2021-02-08 – 2021-02-14 (×12): 1 mg via ORAL
  Filled 2021-02-08 (×17): qty 1

## 2021-02-08 MED ORDER — VALBENAZINE TOSYLATE 40 MG PO CAPS
40.0000 mg | ORAL_CAPSULE | Freq: Every day | ORAL | Status: DC
  Administered 2021-02-08 – 2021-02-15 (×8): 40 mg via ORAL
  Filled 2021-02-08 (×10): qty 1

## 2021-02-08 MED ORDER — TRAZODONE HCL 50 MG PO TABS
50.0000 mg | ORAL_TABLET | Freq: Every evening | ORAL | Status: DC | PRN
  Administered 2021-02-08 – 2021-02-12 (×5): 50 mg via ORAL
  Filled 2021-02-08 (×6): qty 1

## 2021-02-08 MED ORDER — DOCUSATE SODIUM 100 MG PO CAPS: 100.0000 mg | ORAL_CAPSULE | Freq: Every day | ORAL | Status: DC | PRN

## 2021-02-08 NOTE — Progress Notes (Signed)
The patient's positive event for the day is that he played basketball with his peers. He also states that he made one of his peers smile today. His goal for tomorrow is to stay out of bed and attend all of the groups.

## 2021-02-08 NOTE — Progress Notes (Signed)
°   02/08/21 1000  Psych Admission Type (Psych Patients Only)  Admission Status Involuntary  Psychosocial Assessment  Patient Complaints Other (Comment) (Patient complaints of AH, Pain to L shoulder.)  Eye Contact Poor  Facial Expression Flat  Affect Flat  Speech Logical/coherent;Soft;Slow  Interaction Minimal  Motor Activity Slow  Appearance/Hygiene Unremarkable  Behavior Characteristics Cooperative  Mood Other (Comment) (Patient reports that his mood is good, remains flat in affect, cooperative.)  Thought Process  Coherency Blocking  Content Preoccupation (Patient preoccupied with reported shoulder pain this AM during assessment.)  Delusions Paranoid  Perception Hallucinations  Hallucination Auditory  Judgment Impaired  Confusion None  Danger to Self  Current suicidal ideation? Denies  Danger to Others  Danger to Others None reported or observed

## 2021-02-08 NOTE — Progress Notes (Addendum)
[facility] MD Progress Note  02/08/2021 8:32 PM Wesley Peterson  MRN: [id number]  CC: acute psychosis  Subjective: Wesley Peterson is a 36 y.o. male with PPHx schizophrenia vs schizoaffective d/o who presented to [facility] ED for acute psychosis, agitation and concern for HI. He was then admitted involuntarily to [facility] for treatment of psychosis exacerbated by poor therapeutic compliance. [place] [facility]   Interim History: Patient was evaluated today with attending.  Patient endorsed some residual anxiety but overall feels his mood is improving. He reports stable sleep and appetite. Patient continued to endorse AH that are unchanged. He stated that his AH are saying "his, hers, his, hers" but are not command in nature. Patient reported that in the past, he had times where he felt like his thoughts were not his own but currently denies first rank symptoms.He denied SI/HI/VH, delusions, or ideas of reference. Patient denied involuntary movements or signs of EPS but on clinical exam had involuntary movement noted to lower jaw. When this was pointed out to the patient he states he had no awareness until it was discussed. He voiced no other physical complaints.  Diagnosis:  Principal Problem:   Schizoaffective disorder, bipolar type ([place]) Active Problems:   Homeless   Tardive dyskinesia   Past Psychiatric History: See H&P  Past Medical History:  Past Medical History:  Diagnosis Date   Schizophrenia ([place])    Schizophrenia, paranoid type ([place]) 02/02/2021   Family History: see H&P  Family Psychiatric  History: See H&P  Social History:  Social History   Substance and Sexual Activity  Alcohol Use Not Currently   Alcohol/week: 1.0 standard drink   Types: 1 Cans of beer per week   Comment: occasional     Social History   Substance and Sexual Activity  Drug Use Never    Social History   Socioeconomic History   Marital status: Single    Spouse name: Not on file   Number of children: 1    Years of education: Not on file   Highest education level: Not on file  Occupational History   Occupation: Unemployed  Tobacco Use   Smoking status: Never   Smokeless tobacco: Never  Vaping Use   Vaping Use: Never used  Substance and Sexual Activity   Alcohol use: Not Currently    Alcohol/week: 1.0 standard drink    Types: 1 Cans of beer per week    Comment: occasional   Drug use: Never   Sexual activity: Never  Other Topics Concern   Not on file  Social History Narrative   ** Merged History Encounter **       Pt lives with mother and other relatives.  Currently unemployed.  [place] outpatient psychiatry services through [facility].   Social Determinants of Health   Financial Resource Strain: Not on file  Food Insecurity: Not on file  Transportation Needs: Not on file  Physical Activity: Not on file  Stress: Not on file  Social Connections: Not on file    Sleep: Improved  Appetite: Good  Current Medications: Current Facility-Administered Medications  Medication Dose Route Frequency Provider Last Rate Last Admin   acetaminophen (TYLENOL) tablet 650 mg  650 mg Oral Q6H PRN Ethelene Hal, NP   650 mg at 02/08/21 0914   alum & mag hydroxide-simeth (MAALOX/MYLANTA) 200-200-20 MG/5ML suspension 30 mL  30 mL Oral Q4H PRN Ethelene Hal, NP       ARIPiprazole (ABILIFY) tablet 5 mg  5 mg Oral  Daily Merrily Brittle, DO   5 mg at 02/08/21 [id number]   benztropine (COGENTIN) tablet 1 mg  1 mg Oral BID Harlow Asa, MD   1 mg at 02/08/21 1624   docusate sodium (COLACE) capsule 100 mg  100 mg Oral Daily PRN Harlow Asa, MD       hydrOXYzine (ATARAX) tablet 25 mg  25 mg Oral TID PRN Ethelene Hal, NP   25 mg at 02/07/21 0812   OLANZapine zydis (ZYPREXA) disintegrating tablet 5 mg  5 mg Oral Q8H PRN Merrily Brittle, DO       And   LORazepam (ATIVAN) tablet 1 mg  1 mg Oral PRN Merrily Brittle, DO       And   ziprasidone (GEODON) injection 20 mg  20 mg Intramuscular  PRN Merrily Brittle, DO       magnesium hydroxide (MILK OF MAGNESIA) suspension 30 mL  30 mL Oral Daily PRN Ethelene Hal, NP       traZODone (DESYREL) tablet 50 mg  50 mg Oral QHS PRN Harlow Asa, MD       valbenazine [facility]) capsule 40 mg  40 mg Oral Daily Harlow Asa, MD   40 mg at 02/08/21 1626    Lab Results:  Results for orders placed or performed during the hospital encounter of 02/02/21 (from the past 48 hour(s))  Hepatic function panel     Status: Abnormal   Collection Time: 02/08/21  6:26 PM  Result Value Ref Range   Total Protein 8.2 (H) 6.5 - 8.1 g/dL   Albumin 4.6 3.5 - 5.0 g/dL   AST 22 15 - 41 U/L   ALT 14 0 - 44 U/L   Alkaline Phosphatase 59 38 - 126 U/L   Total Bilirubin 0.1 (L) 0.3 - 1.2 mg/dL   Bilirubin, Direct 0.1 0.0 - 0.2 mg/dL   Indirect Bilirubin 0.0 (L) 0.3 - 0.9 mg/dL    Comment: Performed at [facility] [place] [address], [place], [place] [address]    Blood Alcohol level:  Lab Results  Component Value Date   ETH <10 02/02/2021   ETH <10 [id number]    Metabolic Disorder Labs: Lab Results  Component Value Date   HGBA1C 6.0 (H) 02/03/2021   MPG 126 02/03/2021   MPG 114 02/02/2021   Lab Results  Component Value Date   PROLACTIN 23.4 (H) 11/28/2018   Lab Results  Component Value Date   CHOL 131 01/30/2021   TRIG 104 01/30/2021   HDL 30 (L) 01/30/2021   CHOLHDL 4.4 01/30/2021   VLDL 21 01/30/2021   LDLCALC 80 01/30/2021   LDLCALC 69 08/25/2019    Physical Findings:  Musculoskeletal: Strength & Muscle Tone: within normal limits Gait & Station: normal Patient leans: N/A  Psychiatric Specialty Exam:  Presentation  General Appearance: Appropriate for Environment; Casual; Fairly Groomed (First time patient has been awake and out of room)  Eye Contact: Sales executive and coherent, slow  Speech Volume:Decreased  Mood and Affect  Mood:Described as anxious - appears  aloof  Affect:blunted   Thought Process  Thought Processes:superficially goal directed and linear  Orientation:Full (Time, Place and Person)   Thought Content: Denies VH, ideas of reference or current first rank symptoms; denies paranoia; endorses AH but is not grossly responding to internal/external stimuli on exam; denies SI or HI   History of Schizophrenia/Schizoaffective disorder:Yes  Duration of Psychotic Symptoms:Greater than six months  Hallucinations:Hallucinations: Auditory Description of Auditory  Hallucinations: AH "his,hers,his,hers". Patient agreed when asked if AH showing possesion.  Ideas of Reference:None   Suicidal Thoughts:Suicidal Thoughts: No  Homicidal Thoughts:Homicidal Thoughts: No   Sensorium  Memory:Immediate Fair; Recent Fair; Remote Poor  Judgment:Fair  Insight:Lacking   Executive Functions  Concentration:Fair  Attention Span:Fair  [place]   Psychomotor Activity  Psychomotor Activity: Involuntary movement laterally of lower jaw at rest and with distraction; no puckering, lip smacking or involuntary tongue movements,  no truncal or extremity involuntary movements noted; mild cogwheeling bilateral UE; no tremors noted in extremities   Assets  Assets:Leisure Time   Sleep  Total hours unrecorded   Physical Exam: Vitals:   02/07/21 0746 02/08/21 0621 02/08/21 0623 02/08/21 1617  BP: 108/67 124/81 129/87 (!) 138/99  Pulse: 64 (!) 56 86 (!) 108  Temp: 98.2 F (36.8 C) 98 F (36.7 C)    Resp:      Height:      Weight:      SpO2: 97%  95%   TempSrc: Oral Oral    BMI (Calculated):         Physical Exam Vitals and nursing note reviewed.  Constitutional:      General: He is awake. He is not in acute distress.    Appearance: He is not ill-appearing or diaphoretic.  HENT:     Head: Normocephalic.  Pulmonary:     Effort: Pulmonary effort is normal.  Neurological:     General:  No focal deficit present.     Mental Status: He is alert.  Psychiatric:        Behavior: Behavior is cooperative.    Review of Systems  Respiratory:  Negative for shortness of breath.   Cardiovascular:  Negative for chest pain.  Gastrointestinal:  Negative for nausea and vomiting.  Neurological:  Negative for dizziness and headaches.    Treatment Plan & Assessment Summary: Principal Problem:   Schizoaffective disorder, bipolar type ([place]) Active Problems:   Homeless   Tardive dyskinesia  PLAN: Schizoaffective d/o bipolar type (r/o schizophrenia) This is the first time that patient has not appeared internally preoccupied and seen outside his room.  He still endorsed AH that he reported is unchanged.  However he is no longer thought blocking, and is more linear and coherent. S/p Haldol Decanoate 100 mg IM (01/29/2021) Discontinued Depakote DR 250 mg q12hrs - unclear why was started and since he is not having current aggression and since records indicate previous Hep B diagnosis will discontinue - checking LFTs given h/o Hep B and recent use of Depakote Continued Trazodone 50 mg qHS Continue Abilify 5 mg daily  QTC 464ms, A1c 6.0, Lipids WNL other than HDL 30 - will repeat EKG for QTC monitoring on 2 antipsychotics Attempt to get collateral from [facility] to clarify diagnosis Holding stimulant and advised not to restart Adderall    Tardive Dyskinesia and EPS Increase Cogentin 1 mg bid- added Colace 100mg  daily PRN for constipation if needed with dose increase in Cogentin Started Ingrezza 40 mg daily (monitoring LFTS given Hep B history and Qtc with start of med)  Prediabetes A1c 6.0 Recommended follow-up with PCP after discharge Will encourage lifestyle modification Will consider metformin if patient is amenable, however unsure of med adherence after dc given hx and disease  Dispo: Patient, No Pcp Per (Inactive)  Dispo: homeless shelter (Patient was living with father  prior to admission) Pending clinical improvement IVC'd by father Wesley Peterson [phone] Will have patient  f/u with medication assistance program for Ingrezza  Patient's next Haldol Decanoate 100 mg IM injection is due on 02/24/2021  Safety, monitoring and disposition planning: The patient was seen and evaluated on the unit.  The patient's chart was reviewed and nursing notes were reviewed.  The patient's case was discussed in multidisciplinary team meeting.  Plan and drug side effects were discussed with patient who was amendable. Social work and case management to assist with discharge planning and identification of hospital follow-up needs prior to discharge. Discharge Concerns: Need to establish a safety plan; Medication compliance and effectiveness Discharge Goals: Return home with outpatient referrals for mental health follow-up including medication management/psychotherapy Safety and Monitoring: Involuntary status at inpatient psychiatric unit for safety, stabilization and treatment Daily contact with patient to assess and evaluate symptoms and progress in treatment and medical management Patient's case to be discussed in multi-disciplinary team meeting Observation Level: Per above Vital signs:  q12 hours Precautions: suicide   Signed: Blythe Stanford Psychiatry Resident, PGY-1 [place] [facility] 02/08/2021, 8:32 PM

## 2021-02-09 ENCOUNTER — Encounter (HOSPITAL_COMMUNITY): Payer: Self-pay

## 2021-02-09 DIAGNOSIS — F250 Schizoaffective disorder, bipolar type: Secondary | ICD-10-CM | POA: Diagnosis not present

## 2021-02-09 MED ORDER — ARIPIPRAZOLE 10 MG PO TABS
10.0000 mg | ORAL_TABLET | Freq: Every day | ORAL | Status: DC
  Administered 2021-02-10: 10 mg via ORAL
  Filled 2021-02-09 (×3): qty 1

## 2021-02-09 MED ORDER — ARIPIPRAZOLE 5 MG PO TABS
5.0000 mg | ORAL_TABLET | Freq: Once | ORAL | Status: AC
  Administered 2021-02-09: 5 mg via ORAL
  Filled 2021-02-09: qty 1

## 2021-02-09 NOTE — Group Note (Signed)
[facility] LCSW Group Therapy Note ° ° °Group Date: 02/09/2021 °Start Time: 1300 °End Time: 1400 ° ° °Type of Therapy/Topic:  Group Therapy:  Emotion Regulation ° °Participation Level:  Did Not Attend  ° °Mood: ° °Description of Group:   ° The purpose of this group is to assist patients in learning to regulate negative emotions and experience positive emotions. Patients will be guided to discuss ways in which they have been vulnerable to their negative emotions. These vulnerabilities will be juxtaposed with experiences of positive emotions or situations, and patients challenged to use positive emotions to combat negative ones. Special emphasis will be placed on coping with negative emotions in conflict situations, and patients will process healthy conflict resolution skills. ° °Therapeutic Goals: °Patient will identify two positive emotions or experiences to reflect on in order to balance out negative emotions:  °Patient will label two or more emotions that they find the most difficult to experience:  °Patient will be able to demonstrate positive conflict resolution skills through discussion or role plays:  ° °Summary of Patient Progress: ° ° °Did Not Attend ° ° ° °Therapeutic Modalities:   °Cognitive Behavioral Therapy °Feelings Identification °Dialectical Behavioral Therapy ° ° °Sady Monaco M Antoni Stefan, LCSWA °

## 2021-02-09 NOTE — BH IP Treatment Plan (Signed)
Interdisciplinary Treatment and Diagnostic Plan Update  02/09/2021 Time of Session: 10:00am  Kanye Depree MRN: [id number]  Principal Diagnosis: Schizoaffective disorder, bipolar type (HCC)  Secondary Diagnoses: Principal Problem:   Schizoaffective disorder, bipolar type (HCC) Active Problems:   Homeless   Tardive dyskinesia   Current Medications:  Current Facility-Administered Medications  Medication Dose Route Frequency Provider Last Rate Last Admin   acetaminophen (TYLENOL) tablet 650 mg  650 mg Oral Q6H PRN Laveda Abbe, NP   650 mg at 02/09/21 0838   alum & mag hydroxide-simeth (MAALOX/MYLANTA) 200-200-20 MG/5ML suspension 30 mL  30 mL Oral Q4H PRN Laveda Abbe, NP       Melene Muller ON 02/10/2021] ARIPiprazole (ABILIFY) tablet 10 mg  10 mg Oral Daily [name], DO       benztropine (COGENTIN) tablet 1 mg  1 mg Oral BID [place] Jim, Amy E, MD   1 mg at 02/09/21 5809   docusate sodium (COLACE) capsule 100 mg  100 mg Oral Daily PRN Comer Locket, MD       hydrOXYzine (ATARAX) tablet 25 mg  25 mg Oral TID PRN Laveda Abbe, NP   25 mg at 02/09/21 0838   OLANZapine zydis (ZYPREXA) disintegrating tablet 5 mg  5 mg Oral Q8H PRN [name], DO       And   LORazepam (ATIVAN) tablet 1 mg  1 mg Oral PRN [name], DO       And   ziprasidone (GEODON) injection 20 mg  20 mg Intramuscular PRN [name], DO       magnesium hydroxide (MILK OF MAGNESIA) suspension 30 mL  30 mL Oral Daily PRN Laveda Abbe, NP       traZODone (DESYREL) tablet 50 mg  50 mg Oral QHS PRN Comer Locket, MD   50 mg at 02/08/21 2118   valbenazine (INGREZZA) capsule 40 mg  40 mg Oral Daily Bartholomew Crews E, MD   40 mg at 02/09/21 9833   PTA Medications: Medications Prior to Admission  Medication Sig Dispense Refill Last Dose   amphetamine-dextroamphetamine (ADDERALL) 5 MG tablet Take 10 mg by mouth every morning. (Patient not taking: Reported on 01/28/2021)      benztropine  (COGENTIN) 1 MG tablet Take 1 tablet (1 mg total) by mouth 2 (two) times daily. (Patient not taking: Reported on 01/28/2021) 60 tablet 0    [START ON 02/24/2021] haloperidol decanoate (HALDOL DECANOATE) 100 MG/ML injection Inject 1 mL (100 mg total) into the muscle every 28 (twenty-eight) days. 1 mL 0     Patient Stressors: Financial difficulties    Patient Strengths: Ability for Contractor for treatment/growth   Treatment Modalities: Medication Management, Group therapy, Case management,  1 to 1 session with clinician, Psychoeducation, Recreational therapy.   Physician Treatment Plan for Primary Diagnosis: Schizoaffective disorder, bipolar type (HCC) Long Term Goal(s):     Short Term Goals:    Medication Management: Evaluate patient's response, side effects, and tolerance of medication regimen.  Therapeutic Interventions: 1 to 1 sessions, Unit Group sessions and Medication administration.  Evaluation of Outcomes: Progressing  Physician Treatment Plan for Secondary Diagnosis: Principal Problem:   Schizoaffective disorder, bipolar type (HCC) Active Problems:   Homeless   Tardive dyskinesia  Long Term Goal(s):     Short Term Goals:       Medication Management: Evaluate patient's response, side effects, and tolerance of medication regimen.  Therapeutic Interventions: 1 to 1 sessions, Unit Group  sessions and Medication administration.  Evaluation of Outcomes: Progressing   RN Treatment Plan for Primary Diagnosis: Schizoaffective disorder, bipolar type (HCC) Long Term Goal(s): Knowledge of disease and therapeutic regimen to maintain health will improve  Short Term Goals: Ability to remain free from injury will improve, Ability to participate in decision making will improve, Ability to verbalize feelings will improve, Ability to disclose and discuss suicidal ideas, and Ability to identify and develop effective coping behaviors will  improve  Medication Management: RN will administer medications as ordered by provider, will assess and evaluate patient's response and provide education to patient for prescribed medication. RN will report any adverse and/or side effects to prescribing provider.  Therapeutic Interventions: 1 on 1 counseling sessions, Psychoeducation, Medication administration, Evaluate responses to treatment, Monitor vital signs and CBGs as ordered, Perform/monitor CIWA, COWS, AIMS and Fall Risk screenings as ordered, Perform wound care treatments as ordered.  Evaluation of Outcomes: Progressing   LCSW Treatment Plan for Primary Diagnosis: Schizoaffective disorder, bipolar type (HCC) Long Term Goal(s): Safe transition to appropriate next level of care at discharge, Engage patient in therapeutic group addressing interpersonal concerns.  Short Term Goals: Engage patient in aftercare planning with referrals and resources, Increase social support, Increase emotional regulation, Facilitate acceptance of mental health diagnosis and concerns, Identify triggers associated with mental health/substance abuse issues, and Increase skills for wellness and recovery  Therapeutic Interventions: Assess for all discharge needs, 1 to 1 time with Social worker, Explore available resources and support systems, Assess for adequacy in community support network, Educate family and significant other(s) on suicide prevention, Complete Psychosocial Assessment, Interpersonal group therapy.  Evaluation of Outcomes: Progressing   Progress in Treatment: Attending groups: No. Participating in groups: No. Taking medication as prescribed: Yes. Toleration medication: Yes. Family/Significant other contact made: Yes, individual(s) contacted:  father, Javid Kemler [phone]) Patient understands diagnosis: No. Discussing patient identified problems/goals with staff: Yes. Medical problems stabilized or resolved: Yes. Denies  suicidal/homicidal ideation: Yes. Issues/concerns per patient self-inventory: No. Other: None   New problem(s) identified: No, Describe:  None   New Short Term/Long Term Goal(s):medication stabilization, elimination of SI thoughts, development of comprehensive mental wellness plan.    Patient Goals:  "work on my strength"   Discharge Plan or Barriers: Patient recently admitted. CSW will continue to follow and assess for appropriate referrals and possible discharge planning.    Reason for Continuation of Hospitalization: Delusions  Hallucinations Medication stabilization   Estimated Length of Stay: 3-5 days   Scribe for Treatment Team: Catha Brow 02/09/2021 3:16 PM

## 2021-02-09 NOTE — Group Note (Signed)
Recreation Therapy Group Note   Group Topic:Stress Management  Group Date: 02/09/2021 Start Time: 0935 End Time: 0950 Facilitators: Caroll Rancher, [place] Location: [address] Dayroom   Goal Area(s) Addresses:  Patient will identify positive stress management techniques. Patient will identify benefits of using stress management post d/c.  Group Description:  Meditation.  LRT played a meditation that focused on being resilient.  Patients were to focus and be attentive to the meditation as it played.  Patients were to also focus on their breathing and allow it to relax them as much as possible.   Affect/Mood: N/A   Participation Level: Did not attend    Clinical Observations/Individualized Feedback:     Plan: Continue to engage patient in RT group sessions 2-3x/week.   Caroll Rancher, LRT,CTRS 02/09/2021 11:58 AM

## 2021-02-09 NOTE — Progress Notes (Signed)
Psychoeducational Group Note  Date:  02/09/2021 Time:  2138  Group Topic/Focus:  Wrap-Up Group:   The focus of this group is to help patients review their daily goal of treatment and discuss progress on daily workbooks.  Participation Level: Did Not Attend  Participation Quality:  Not Applicable  Affect:  Not Applicable  Cognitive:  Not Applicable  Insight:  Not Applicable  Engagement in Group: Not Applicable  Additional Comments:  The patient did not attend the evening N.A.meeting.   Archie Balboa S 02/09/2021, 9:38 PM

## 2021-02-09 NOTE — Progress Notes (Incomplete)
[facility] MD Progress Note  02/09/2021 9:22 AM Wesley Peterson  MRN: [id number]  CC: acute psychosis  Subjective: Wesley Peterson is a 36 y.o. male with PPHx schizophrenia vs schizoaffective d/o who presented to [facility] ED for acute psychosis, agitation and concern for HI. He was then admitted involuntarily to [facility] for treatment of psychosis exacerbated by poor therapeutic compliance. [place] Day 7   Interim History: Patient was evaluated today with attending.   Patient endorsed some residual anxiety but overall feels his mood is improving. He reports stable sleep and appetite. Patient continued to endorse AH that are unchanged. He stated that his AH are saying "his, hers, his, hers" but are not command in nature. Patient reported that in the past, he had times where he felt like his thoughts were not his own but currently denies first rank symptoms.He denied SI/HI/VH, delusions, or ideas of reference. Patient denied involuntary movements or signs of EPS but on clinical exam had involuntary movement noted to lower jaw. When this was pointed out to the patient he states he had no awareness until it was discussed. He voiced no other physical complaints.  Diagnosis:  Principal Problem:   Schizoaffective disorder, bipolar type ([place]) Active Problems:   Homeless   Tardive dyskinesia   Past Psychiatric History: See H&P  Past Medical History:  Past Medical History:  Diagnosis Date   Schizophrenia ([place])    Schizophrenia, paranoid type ([place]) 02/02/2021   Family History: see H&P  Family Psychiatric  History: See H&P  Social History:  Social History   Substance and Sexual Activity  Alcohol Use Not Currently   Alcohol/week: 1.0 standard drink   Types: 1 Cans of beer per week   Comment: occasional     Social History   Substance and Sexual Activity  Drug Use Never    Social History   Socioeconomic History   Marital status: Single    Spouse name: Not on file   Number of children: 1    Years of education: Not on file   Highest education level: Not on file  Occupational History   Occupation: Unemployed  Tobacco Use   Smoking status: Never   Smokeless tobacco: Never  Vaping Use   Vaping Use: Never used  Substance and Sexual Activity   Alcohol use: Not Currently    Alcohol/week: 1.0 standard drink    Types: 1 Cans of beer per week    Comment: occasional   Drug use: Never   Sexual activity: Never  Other Topics Concern   Not on file  Social History Narrative   ** Merged History Encounter **       Pt lives with mother and other relatives.  Currently unemployed.  [place] outpatient psychiatry services through [facility].   Social Determinants of Health   Financial Resource Strain: Not on file  Food Insecurity: Not on file  Transportation Needs: Not on file  Physical Activity: Not on file  Stress: Not on file  Social Connections: Not on file    Sleep: Improved  Appetite: Good  Current Medications: Current Facility-Administered Medications  Medication Dose Route Frequency Provider Last Rate Last Admin   acetaminophen (TYLENOL) tablet 650 mg  650 mg Oral Q6H PRN Ethelene Hal, NP   650 mg at 02/09/21 0838   alum & mag hydroxide-simeth (MAALOX/MYLANTA) 200-200-20 MG/5ML suspension 30 mL  30 mL Oral Q4H PRN Ethelene Hal, NP       ARIPiprazole (ABILIFY) tablet 5 mg  5 mg  Oral Daily [name], DO   5 mg at 02/09/21 4403   benztropine (COGENTIN) tablet 1 mg  1 mg Oral BID Comer Locket, MD   1 mg at 02/09/21 4742   docusate sodium (COLACE) capsule 100 mg  100 mg Oral Daily PRN Comer Locket, MD       hydrOXYzine (ATARAX) tablet 25 mg  25 mg Oral TID PRN Laveda Abbe, NP   25 mg at 02/09/21 0838   OLANZapine zydis (ZYPREXA) disintegrating tablet 5 mg  5 mg Oral Q8H PRN [name], DO       And   LORazepam (ATIVAN) tablet 1 mg  1 mg Oral PRN [name], DO       And   ziprasidone (GEODON) injection 20 mg  20 mg Intramuscular  PRN [name], DO       magnesium hydroxide (MILK OF MAGNESIA) suspension 30 mL  30 mL Oral Daily PRN Laveda Abbe, NP       traZODone (DESYREL) tablet 50 mg  50 mg Oral QHS PRN Comer Locket, MD   50 mg at 02/08/21 2118   valbenazine (INGREZZA) capsule 40 mg  40 mg Oral Daily Comer Locket, MD   40 mg at 02/09/21 5956    Lab Results:  Results for orders placed or performed during the hospital encounter of 02/02/21 (from the past 48 hour(s))  Hepatic function panel     Status: Abnormal   Collection Time: 02/08/21  6:26 PM  Result Value Ref Range   Total Protein 8.2 (H) 6.5 - 8.1 g/dL   Albumin 4.6 3.5 - 5.0 g/dL   AST 22 15 - 41 U/L   ALT 14 0 - 44 U/L   Alkaline Phosphatase 59 38 - 126 U/L   Total Bilirubin 0.1 (L) 0.3 - 1.2 mg/dL   Bilirubin, Direct 0.1 0.0 - 0.2 mg/dL   Indirect Bilirubin 0.0 (L) 0.3 - 0.9 mg/dL    Comment: Performed at [facility], [address] [place], [place] [address]    Blood Alcohol level:  Lab Results  Component Value Date   ETH <10 02/02/2021   ETH <10 01/28/2021    Metabolic Disorder Labs: Lab Results  Component Value Date   HGBA1C 6.0 (H) 02/03/2021   MPG 126 02/03/2021   MPG 114 02/02/2021   Lab Results  Component Value Date   PROLACTIN 23.4 (H) 11/28/2018   Lab Results  Component Value Date   CHOL 131 01/30/2021   TRIG 104 01/30/2021   HDL 30 (L) 01/30/2021   CHOLHDL 4.4 01/30/2021   VLDL 21 01/30/2021   LDLCALC 80 01/30/2021   LDLCALC 69 08/25/2019    Physical Findings:  Musculoskeletal: Strength & Muscle Tone: within normal limits Gait & Station: normal Patient leans: N/A  Psychiatric Specialty Exam:  Presentation  General Appearance: Appropriate for Environment; Casual; Fairly Groomed (First time patient has been awake and out of room)  Eye Contact: Licensed conveyancer and coherent, slow  Speech Volume:Decreased  Mood and Affect  Mood:Described as anxious - appears  aloof  Affect:blunted   Thought Process  Thought Processes:superficially goal directed and linear  Orientation:Full (Time, Place and Person)   Thought Content: Denies VH, ideas of reference or current first rank symptoms; denies paranoia; endorses AH but is not grossly responding to internal/external stimuli on exam; denies SI or HI   History of Schizophrenia/Schizoaffective disorder:Yes  Duration of Psychotic Symptoms:Greater than six months  Hallucinations:Hallucinations:  Auditory Description of Auditory Hallucinations: AH "his,hers,his,hers". Patient agreed when asked if AH showing possesion.  Ideas of Reference:None   Suicidal Thoughts:Suicidal Thoughts: No  Homicidal Thoughts:Homicidal Thoughts: No   Sensorium  Memory:Immediate Fair; Recent Fair; Remote Poor  Judgment:Fair  Insight:Lacking   Executive Functions  Concentration:Fair  Attention Span:Fair  [place]   Psychomotor Activity  Psychomotor Activity: Involuntary movement laterally of lower jaw at rest and with distraction; no puckering, lip smacking or involuntary tongue movements,  no truncal or extremity involuntary movements noted; mild cogwheeling bilateral UE; no tremors noted in extremities   Assets  Assets:Leisure Time   Sleep  Total hours unrecorded   Physical Exam: Vitals:   02/08/21 0623 02/08/21 1617 02/09/21 0611 02/09/21 0613  BP: 129/87 (!) 138/99 117/85 122/84  Pulse: 86 (!) 108 (!) 51 76  Temp:   97.8 F (36.6 C)   Resp:      Height:      Weight:      SpO2: 95%   99%  TempSrc:   Oral   BMI (Calculated):         Physical Exam Vitals and nursing note reviewed.  Constitutional:      General: He is awake. He is not in acute distress.    Appearance: He is not ill-appearing or diaphoretic.  HENT:     Head: Normocephalic.  Pulmonary:     Effort: Pulmonary effort is normal.  Neurological:     General: No focal deficit  present.     Mental Status: He is alert.  Psychiatric:        Behavior: Behavior is cooperative.    Review of Systems  Respiratory:  Negative for shortness of breath.   Cardiovascular:  Negative for chest pain.  Gastrointestinal:  Negative for nausea and vomiting.  Neurological:  Negative for dizziness and headaches.    Treatment Plan & Assessment Summary: Principal Problem:   Schizoaffective disorder, bipolar type ([place]) Active Problems:   Homeless   Tardive dyskinesia  PLAN: Schizoaffective d/o bipolar type (r/o schizophrenia) This is the first time that patient has not appeared internally preoccupied and seen outside his room.  He still endorsed AH that he reported is unchanged.  However he is no longer thought blocking, and is more linear and coherent. S/p Haldol Decanoate 100 mg IM (01/29/2021) Continued Trazodone 50 mg qHS Continue Abilify 5 mg daily *** QTC 4102ms, A1c 6.0, Lipids WNL other than HDL 30 - will repeat EKG for QTC monitoring on 2 antipsychotics Attempt to get collateral from [facility] to clarify diagnosis Holding stimulant and advised not to restart Adderall    Tardive Dyskinesia and EPS Continue Cogentin 1 mg bid- added Colace 100mg  daily PRN for constipation if needed with dose increase in Cogentin Continue Ingrezza 40 mg daily (monitoring LFTS given Hep B history and Qtc with start of med)  Prediabetes A1c 6.0 Recommended follow-up with PCP after discharge Will encourage lifestyle modification Will consider metformin if patient is amenable, however unsure of med adherence after dc given hx and disease  Dispo: Patient, No Pcp Per (Inactive)  Dispo: homeless shelter (Patient was living with father prior to admission) Pending clinical improvement IVC'd by father Kebin Merrick [phone] Will have patient f/u with medication assistance program for Ingrezza  Patient's next Haldol Decanoate 100 mg IM injection is due on 02/24/2021  Safety,  monitoring and disposition planning: The patient was seen and evaluated on the unit.  The patient's chart was  reviewed and nursing notes were reviewed.  The patient's case was discussed in multidisciplinary team meeting.  Plan and drug side effects were discussed with patient who was amendable. Social work and case management to assist with discharge planning and identification of hospital follow-up needs prior to discharge. Discharge Concerns: Need to establish a safety plan; Medication compliance and effectiveness Discharge Goals: Return home with outpatient referrals for mental health follow-up including medication management/psychotherapy Safety and Monitoring: Involuntary status at inpatient psychiatric unit for safety, stabilization and treatment Daily contact with patient to assess and evaluate symptoms and progress in treatment and medical management Patient's case to be discussed in multi-disciplinary team meeting Observation Level: Per above Vital signs:  q12 hours Precautions: suicide   Signed: Blythe Stanford Psychiatry Resident, PGY-1 [facility] 02/09/2021, 9:22 AM

## 2021-02-09 NOTE — Progress Notes (Addendum)
[facility] MD Progress Note  02/09/2021 12:46 PM Wesley Peterson  MRN: [id number]  CC: acute psychosis  Subjective: Wesley Peterson is a 36 y.o. male with PPHx schizophrenia vs schizoaffective d/o who presented to [facility] ED for acute psychosis, agitation and concern for HI. He was then admitted involuntarily to [facility] for treatment of psychosis exacerbated by poor therapeutic compliance. [place] Day 7   Interim History: Case was discussed in team meeting and nursing notes were reviewed. The patient had no acute behavioral issues noted and attended select groups. He was compliant with scheduled medications. Patient was evaluated today with attending. Patient continued to report AH of a voice that sounds like his stepbrother saying "his, theirs" when he does things or touches items which the patient perceives as his stepbrother claiming possession of his things. He exhibited inappropriate smiling at times during evaluation.  He is also not able to reality test when discussing his residual AH. He reported belief in thought insertion by his stepbrother and paranoia about his family.  He believes his family is trying to control his mind. In response to why he thinks his family is controlling his mind, he stated, "you called them, shouldn't you have figured that out". He still has residual anxiety but denied depression or irritability.  Explained that he is at risk to potentially come off of antipsychotics due to his underlying schizoaffective versus schizophrenia diagnosis for fear of acute psychiatric decompensation.  It was discussed that we are giving him a trial of Ingrezza to see if TD symptoms will improve and he states that he is not bothered by symptoms and was unaware of any such movements until noted by the team.  He denies thought broadcasting or ideas of reference. He voices no physical complaints and states he is not aware of any residual lateral jaw movements. He denies other difficulty with swallowing,  tongue movements or other involuntary movements of the body. He reports stable sleep and appetite. Time was spent discussing his medication regimen in detail but he had limited engagement in conversation. The presence of TD was discussed and he was made aware that this is likely due to his use of antipsychotic medications. It was discussed with the patient that he is at risk of acute psychiatric decompensation given his schizoaffective versus schizophrenia diagnosis to come off of antipsychotics and therefore we will try to treat any residual TD symptoms with Ingrezza.  The risk benefits and side effects of Ingrezza were again discussed and he is amenable to staying on the medication.  We discussed dose titration up on his Abilify to help treat residual auditory hallucinations first rank symptoms and paranoia.  Diagnosis:  Principal Problem:   Schizoaffective disorder, bipolar type ([place]) Active Problems:   Homeless   Tardive dyskinesia   Past Psychiatric History: See H&P  Past Medical History:  Past Medical History:  Diagnosis Date   Schizophrenia ([place])    Schizophrenia, paranoid type ([place]) 02/02/2021   Family History: see H&P  Family Psychiatric  History: See H&P  Social History:  Social History   Substance and Sexual Activity  Alcohol Use Not Currently   Alcohol/week: 1.0 standard drink   Types: 1 Cans of beer per week   Comment: occasional     Social History   Substance and Sexual Activity  Drug Use Never    Social History   Socioeconomic History   Marital status: Single    Spouse name: Not on file   Number of children: 1  Years of education: Not on file   Highest education level: Not on file  Occupational History   Occupation: Unemployed  Tobacco Use   Smoking status: Never   Smokeless tobacco: Never  Vaping Use   Vaping Use: Never used  Substance and Sexual Activity   Alcohol use: Not Currently    Alcohol/week: 1.0 standard drink    Types: 1 Cans of beer per  week    Comment: occasional   Drug use: Never   Sexual activity: Never  Other Topics Concern   Not on file  Social History Narrative   ** Merged History Encounter **       Pt lives with mother and other relatives.  Currently unemployed.  [place] outpatient psychiatry services through [facility].   Social Determinants of Health   Financial Resource Strain: Not on file  Food Insecurity: Not on file  Transportation Needs: Not on file  Physical Activity: Not on file  Stress: Not on file  Social Connections: Not on file    Sleep: Improved  Appetite: Good  Current Medications: Current Facility-Administered Medications  Medication Dose Route Frequency Provider Last Rate Last Admin   acetaminophen (TYLENOL) tablet 650 mg  650 mg Oral Q6H PRN Ethelene Hal, NP   650 mg at 02/09/21 0838   alum & mag hydroxide-simeth (MAALOX/MYLANTA) 200-200-20 MG/5ML suspension 30 mL  30 mL Oral Q4H PRN Ethelene Hal, NP       Derrill Memo ON 02/10/2021] ARIPiprazole (ABILIFY) tablet 10 mg  10 mg Oral Daily Merrily Brittle, DO       ARIPiprazole (ABILIFY) tablet 5 mg  5 mg Oral Once Merrily Brittle, DO       benztropine (COGENTIN) tablet 1 mg  1 mg Oral BID Viann Fish E, MD   1 mg at 02/09/21 [id number]   docusate sodium (COLACE) capsule 100 mg  100 mg Oral Daily PRN Harlow Asa, MD       hydrOXYzine (ATARAX) tablet 25 mg  25 mg Oral TID PRN Ethelene Hal, NP   25 mg at 02/09/21 0838   OLANZapine zydis (ZYPREXA) disintegrating tablet 5 mg  5 mg Oral Q8H PRN Merrily Brittle, DO       And   LORazepam (ATIVAN) tablet 1 mg  1 mg Oral PRN Merrily Brittle, DO       And   ziprasidone (GEODON) injection 20 mg  20 mg Intramuscular PRN Merrily Brittle, DO       magnesium hydroxide (MILK OF MAGNESIA) suspension 30 mL  30 mL Oral Daily PRN Ethelene Hal, NP       traZODone (DESYREL) tablet 50 mg  50 mg Oral QHS PRN Harlow Asa, MD   50 mg at 02/08/21 2118   valbenazine (INGREZZA) capsule 40 mg   40 mg Oral Daily Harlow Asa, MD   40 mg at 02/09/21 [id number]    Lab Results:  Results for orders placed or performed during the hospital encounter of 02/02/21 (from the past 48 hour(s))  Hepatic function panel     Status: Abnormal   Collection Time: 02/08/21  6:26 PM  Result Value Ref Range   Total Protein 8.2 (H) 6.5 - 8.1 g/dL   Albumin 4.6 3.5 - 5.0 g/dL   AST 22 15 - 41 U/L   ALT 14 0 - 44 U/L   Alkaline Phosphatase 59 38 - 126 U/L   Total Bilirubin 0.1 (L) 0.3 - 1.2 mg/dL   Bilirubin, Direct 0.1 0.0 -  0.2 mg/dL   Indirect Bilirubin 0.0 (L) 0.3 - 0.9 mg/dL    Comment: Performed at [facility], [place] [address] [place],  [address]    Blood Alcohol level:  Lab Results  Component Value Date   ETH <10 02/02/2021   ETH <10 [id number]    Metabolic Disorder Labs: Lab Results  Component Value Date   HGBA1C 6.0 (H) 02/03/2021   MPG 126 02/03/2021   MPG 114 02/02/2021   Lab Results  Component Value Date   PROLACTIN 23.4 (H) 11/28/2018   Lab Results  Component Value Date   CHOL 131 01/30/2021   TRIG 104 01/30/2021   HDL 30 (L) 01/30/2021   CHOLHDL 4.4 01/30/2021   VLDL 21 01/30/2021   LDLCALC 80 01/30/2021   LDLCALC 69 08/25/2019    Physical Findings:  Musculoskeletal: Strength & Muscle Tone: within normal limits Gait & Station: normal Patient leans: N/A  Psychiatric Specialty Exam:  Presentation  General Appearance: casually dressed, fair hygiene  Eye Contact: Fair - staring quality  Speech:Clear and coherent, slow  Speech Volume:Decreased  Mood and Affect  Mood:Described as anxious - appears aloof  Affect: blunted but smiles inappropriately on exam, guarded   Thought Process  Thought Processes:ruminative about delusional content; concrete  Orientation:Full (Time, Place and Person)   Thought Content: Denies VH but endorses AH, thought insertion, and belief in thought control by family; is paranoid about family;  appears guarded on exam and internally preoccupied; denies ideas of reference or thought broadcasting   History of Schizophrenia/Schizoaffective disorder:Yes  Duration of Psychotic Symptoms:Greater than six months  Hallucinations:Hallucinations: Auditory Description of Auditory Hallucinations: AH "his,hers,his,hers". Patient agreed when asked if AH showing possesion.  Ideas of Reference:None   Suicidal Thoughts:Suicidal Thoughts: No  Homicidal Thoughts:Homicidal Thoughts: No   Sensorium  Memory:Immediate Fair; Recent Fair; Remote Poor  Judgment: Fair - compliant with meds  Insight:Lacking   Executive Functions  Concentration:Fair  Attention Span:Fair  [place]   Psychomotor Activity  Psychomotor Activity: Involuntary movement laterally of lower jaw at rest and with distraction - less pronounced today; no puckering, lip smacking or involuntary tongue movements,  no truncal or extremity involuntary movements noted; no cogwheeling bilateral UE; no tremors noted in extremities   Assets  Assets:Leisure Time   Sleep  6.25 hours   Physical Exam: Vitals:   02/08/21 0623 02/08/21 1617 02/09/21 0611 02/09/21 0613  BP: 129/87 (!) 138/99 117/85 122/84  Pulse: 86 (!) 108 (!) 51 76  Temp:   97.8 F (36.6 C)   Resp:      Height:      Weight:      SpO2: 95%   99%  TempSrc:   Oral   BMI (Calculated):         Physical Exam Vitals and nursing note reviewed.  Constitutional:      General: He is awake. He is not in acute distress.    Appearance: He is not ill-appearing or diaphoretic.  HENT:     Head: Normocephalic.  Pulmonary:     Effort: Pulmonary effort is normal.  Neurological:     General: No focal deficit present.     Mental Status: He is alert.  Psychiatric:        Behavior: Behavior is cooperative.    Review of Systems  Respiratory:  Negative for shortness of breath.   Cardiovascular:  Negative for chest  pain.  Gastrointestinal:  Negative for nausea and vomiting.  Neurological:  Negative for dizziness and headaches.    Treatment Plan & Assessment Summary: Principal Problem:   Schizoaffective disorder, bipolar type ([place]) Active Problems:   Homeless   Tardive dyskinesia  PLAN: Schizoaffective d/o bipolar type (r/o schizophrenia) Patient was awake and seen in the day room prior to admission. Patient still exhibits lingering psychotic sxs per above S/p Haldol Decanoate 100 mg IM (01/29/2021) Continued Trazodone 50 mg qHS Increased Abilify 5 mg to 10 mg daily  QTC 481ms, A1c 6.0, Lipids WNL other than HDL 30 - will repeat EKG for QTC monitoring on 2 antipsychotics Holding stimulant and advised not to restart Adderall    Tardive Dyskinesia and EPS Improved lateral jaw movement, very subtle when patient is distracted, patient does not notice. Continued Cogentin 1 mg bid- added Colace 100mg  daily PRN for constipation if needed with dose increase in Cogentin Continued Ingrezza 40 mg daily (monitoring LFTS given Hep B history and Qtc with start of med)  Prediabetes A1c 6.0 Recommended follow-up with PCP after discharge Will encourage lifestyle modification Will consider metformin if patient is amenable, however unsure of med adherence after dc given hx and disease  Questionable h/o Hep B  Patient is unaware of previous diagnosis but thinks he was vaccinated for Hep B  - will check acute hepatitis panel and Hep B quant  Dispo: Patient, No Pcp Per (Inactive)  Dispo: homeless shelter (Patient was living with father prior to admission) Pending clinical improvement IVC'd by father Lev Ciolek [phone] Will have patient f/u with medication assistance program for Ingrezza  Patient's next Haldol Decanoate 100 mg IM injection is due on 02/24/2021  Safety, monitoring and disposition planning: The patient was seen and evaluated on the unit.  The patient's chart was reviewed and nursing  notes were reviewed.  The patient's case was discussed in multidisciplinary team meeting.  Plan and drug side effects were discussed with patient who was amendable. Social work and case management to assist with discharge planning and identification of hospital follow-up needs prior to discharge. Discharge Concerns: Need to establish a safety plan; Medication compliance and effectiveness Discharge Goals: Return home with outpatient referrals for mental health follow-up including medication management/psychotherapy Safety and Monitoring: Involuntary status at inpatient psychiatric unit for safety, stabilization and treatment Daily contact with patient to assess and evaluate symptoms and progress in treatment and medical management Patient's case to be discussed in multi-disciplinary team meeting Observation Level: Per above Vital signs:  q12 hours Precautions: suicide   Signed: Blythe Stanford Psychiatry Resident, PGY-1 [facility] 02/09/2021, 12:46 PM

## 2021-02-09 NOTE — Progress Notes (Signed)
°   02/08/21 2115  Psych Admission Type (Psych Patients Only)  Admission Status Involuntary  Psychosocial Assessment  Patient Complaints Other (Comment) (L shoulder pain)  Eye Contact Brief  Facial Expression Flat  Affect Flat  Speech Logical/coherent;Soft;Slow  Interaction Minimal  Motor Activity Slow  Appearance/Hygiene Layered clothes  Behavior Characteristics Appropriate to situation;Cooperative  Mood Pleasant  Thought Process  Coherency WDL  Content Paranoia  Delusions Paranoid  Perception Hallucinations  Hallucination Auditory  Judgment Impaired  Confusion None  Danger to Self  Current suicidal ideation? Denies  Danger to Others  Danger to Others None reported or observed

## 2021-02-09 NOTE — Progress Notes (Signed)
°   02/09/21 0838  Psych Admission Type (Psych Patients Only)  Admission Status Involuntary  Psychosocial Assessment  Patient Complaints Other (Comment) (pain and voices)  Eye Contact Brief;Avoids  Facial Expression Flat;Pensive;Sad  Affect Flat  Speech Logical/coherent;Soft;Slow  Interaction Forwards little;Minimal;Needy  Motor Activity Slow  Appearance/Hygiene Unremarkable  Behavior Characteristics Cooperative;Appropriate to situation;Guarded  Mood Pleasant  Thought Process  Coherency WDL  Content Paranoia  Delusions Paranoid  Perception Hallucinations  Hallucination Auditory  Judgment Limited  Confusion None  Danger to Self  Current suicidal ideation? Denies  Danger to Others  Danger to Others None reported or observed

## 2021-02-10 DIAGNOSIS — F250 Schizoaffective disorder, bipolar type: Secondary | ICD-10-CM | POA: Diagnosis not present

## 2021-02-10 LAB — HEPATITIS PANEL, ACUTE
HCV Ab: NONREACTIVE
Hep A IgM: NONREACTIVE
Hep B C IgM: NONREACTIVE
Hepatitis B Surface Ag: NONREACTIVE

## 2021-02-10 LAB — HEPATITIS B DNA, ULTRAQUANTITATIVE, PCR
HBV DNA SERPL PCR-ACNC: NOT DETECTED IU/mL
HBV DNA SERPL PCR-LOG IU: UNDETERMINED log10 IU/mL

## 2021-02-10 MED ORDER — ARIPIPRAZOLE 15 MG PO TABS
15.0000 mg | ORAL_TABLET | Freq: Every day | ORAL | Status: DC
  Administered 2021-02-11: 15 mg via ORAL
  Filled 2021-02-10 (×3): qty 1

## 2021-02-10 MED ORDER — ARIPIPRAZOLE 5 MG PO TABS
5.0000 mg | ORAL_TABLET | Freq: Once | ORAL | Status: DC
  Filled 2021-02-10 (×2): qty 1

## 2021-02-10 MED ORDER — TRAZODONE HCL 50 MG PO TABS
50.0000 mg | ORAL_TABLET | Freq: Once | ORAL | Status: AC
  Administered 2021-02-10: 50 mg via ORAL
  Filled 2021-02-10: qty 1

## 2021-02-10 NOTE — Plan of Care (Signed)
°  Problem: Education: Goal: Mental status will improve Outcome: Progressing   Problem: Education: Goal: Emotional status will improve Outcome: Not Progressing   Problem: Activity: Goal: Interest or engagement in activities will improve Outcome: Not Progressing

## 2021-02-10 NOTE — Progress Notes (Signed)
°   02/10/21 0702  Sleep  Number of Hours 7

## 2021-02-10 NOTE — Plan of Care (Signed)
[facility] to assess if pt's dx is more consistent with schizoaffective, bipolar type vs schizophrenia. Patient was d/c'd from there about 2 weeks prior to admission to [facility]. [facility] staff stated that their records show a dx of schizoaffective, bipolar type and agreed to fax [facility] his d/c summary.  Signed: Merrily Brittle, DO Psychiatry Resident, PGY-1 [facility] 02/10/2021, 9:07 AM

## 2021-02-10 NOTE — BHH Group Notes (Signed)
Pt did not attend wrap-up group   

## 2021-02-10 NOTE — Progress Notes (Signed)
D:  Wesley Peterson was isolative to his room all evening.  He did not attend evening NA group.  He continues to report auditory hallucinations telling him "his, hers and [place]."  He denied SI/HI or VH.  He denied any anxiety or depression.  He requested trazodone for sleep with good relief.   A:  1:1 with RN for support and encouragement.  Medications given as ordered.  Q 15 minute checks maintained for safety.  Encouraged participation in group and unit activities.   R:  He is currently resting with his eyes closed and appears to be asleep.  He remains safe on the unit.  We will continue to monitor the progress towards his goals.

## 2021-02-10 NOTE — Progress Notes (Signed)
Dar Note: Patient presents with a flat affect and depressed mood.  Denies suicidal thoughts but reports auditory and visual hallucinations.  Medication given as prescribed.  Patient is withdrawn and isolative to his room.  Minimal interaction with staff.  Patient is safe on the unit

## 2021-02-10 NOTE — Progress Notes (Addendum)
[facility] MD Progress Note  02/10/2021 5:04 PM Wesley Peterson  MRN: [id number]  CC: acute psychosis  Subjective: Wesley Peterson is a 36 y.o. male with PPHx schizophrenia vs schizoaffective d/o who presented to [facility] ED for acute psychosis, agitation and concern for HI. He was then admitted involuntarily to [facility] for treatment of psychosis exacerbated by poor therapeutic compliance. [place] Day 8   Interim History: Case was discussed in team meeting and nursing notes were reviewed. The patient had no acute behavioral issues noted and attended select groups. He was compliant with scheduled medications. Patient was evaluated today with attending. Patient continued to report AH of a voice that sounds like his stepbrother saying "his, theirs".  He also reported hearing a woman's voice this AM, saying the same thing.  Reported that he doesn't recognize the voice, and reported that it is not commanding in nature. He continued to exhibit poor insight and inability to reality test, still believing that his family is controlling his mind and are able to read his mind although he is in the hospital. However, he then questioned if family was inserting thoughts into his mind stating "they can't put anything in my head".  He is still paranoid of his family, saying that "they were hiding things from me, and trying to lie to me". He also exhibited inappropriate laughing intermittently during encounter concerning for responding internally. He denied ideas of reference.He reported his mood is "tired", stated that his appetite and sleep is stable.  He attributed his daytime somnolence to not having coffee.  In response to if he is having anxiety, patient stated that "it comes and goes", however after multiple redirection, he denied having anxiety today. He denied concerns or side effects from medications.  He reported improvement in his muscle stiffness. He also denied noticing involuntary movements or difficulty swallowing.   He denied noticing any involuntary jaw movements.Patient denied SI/HI/VH and contracted to safety on the unit.   Diagnosis:  Principal Problem:   Schizoaffective disorder, bipolar type ([place]) Active Problems:   Homeless   Tardive dyskinesia   Past Psychiatric History: See H&P  Past Medical History:  Past Medical History:  Diagnosis Date   Schizophrenia ([name])    Schizophrenia, paranoid type ([place]) 02/02/2021   Family History: see H&P  Family Psychiatric  History: See H&P  Social History:  Social History   Substance and Sexual Activity  Alcohol Use Not Currently   Alcohol/week: 1.0 standard drink   Types: 1 Cans of beer per week   Comment: occasional     Social History   Substance and Sexual Activity  Drug Use Never    Social History   Socioeconomic History   Marital status: Single    Spouse name: Not on file   Number of children: 1   Years of education: Not on file   Highest education level: Not on file  Occupational History   Occupation: Unemployed  Tobacco Use   Smoking status: Never   Smokeless tobacco: Never  Vaping Use   Vaping Use: Never used  Substance and Sexual Activity   Alcohol use: Not Currently    Alcohol/week: 1.0 standard drink    Types: 1 Cans of beer per week    Comment: occasional   Drug use: Never   Sexual activity: Never  Other Topics Concern   Not on file  Social History Narrative   ** Merged History Encounter **       Pt lives with mother and  other relatives.  Currently unemployed.  [place] outpatient psychiatry services through [facility].   Social Determinants of Health   Financial Resource Strain: Not on file  Food Insecurity: Not on file  Transportation Needs: Not on file  Physical Activity: Not on file  Stress: Not on file  Social Connections: Not on file    Sleep: Improved  Appetite: Good  Current Medications: Current Facility-Administered Medications  Medication Dose Route Frequency Provider Last Rate Last Admin    acetaminophen (TYLENOL) tablet 650 mg  650 mg Oral Q6H PRN Ethelene Hal, NP   650 mg at 02/09/21 0838   alum & mag hydroxide-simeth (MAALOX/MYLANTA) 200-200-20 MG/5ML suspension 30 mL  30 mL Oral Q4H PRN Ethelene Hal, NP       Derrill Memo ON 02/11/2021] ARIPiprazole (ABILIFY) tablet 15 mg  15 mg Oral Daily Nelda Marseille, Ronelle Smallman E, MD       ARIPiprazole (ABILIFY) tablet 5 mg  5 mg Oral Once Harlow Asa, MD       benztropine (COGENTIN) tablet 1 mg  1 mg Oral BID Nelda Marseille, Kaydon Creedon E, MD   1 mg at 02/10/21 0920   docusate sodium (COLACE) capsule 100 mg  100 mg Oral Daily PRN Harlow Asa, MD       hydrOXYzine (ATARAX) tablet 25 mg  25 mg Oral TID PRN Ethelene Hal, NP   25 mg at 02/09/21 0838   OLANZapine zydis (ZYPREXA) disintegrating tablet 5 mg  5 mg Oral Q8H PRN Merrily Brittle, DO       And   LORazepam (ATIVAN) tablet 1 mg  1 mg Oral PRN Merrily Brittle, DO       And   ziprasidone (GEODON) injection 20 mg  20 mg Intramuscular PRN Merrily Brittle, DO       magnesium hydroxide (MILK OF MAGNESIA) suspension 30 mL  30 mL Oral Daily PRN Ethelene Hal, NP       traZODone (DESYREL) tablet 50 mg  50 mg Oral QHS PRN Harlow Asa, MD   50 mg at 02/09/21 2310   valbenazine (INGREZZA) capsule 40 mg  40 mg Oral Daily Harlow Asa, MD   40 mg at 02/10/21 [id number]    Lab Results:  Results for orders placed or performed during the hospital encounter of 02/02/21 (from the past 48 hour(s))  Hepatic function panel     Status: Abnormal   Collection Time: 02/08/21  6:26 PM  Result Value Ref Range   Total Protein 8.2 (H) 6.5 - 8.1 g/dL   Albumin 4.6 3.5 - 5.0 g/dL   AST 22 15 - 41 U/L   ALT 14 0 - 44 U/L   Alkaline Phosphatase 59 38 - 126 U/L   Total Bilirubin 0.1 (L) 0.3 - 1.2 mg/dL   Bilirubin, Direct 0.1 0.0 - 0.2 mg/dL   Indirect Bilirubin 0.0 (L) 0.3 - 0.9 mg/dL    Comment: Performed at [facility], [place] [address] [place], [place] [address]  Hepatitis  panel, acute     Status: None   Collection Time: 02/09/21  6:34 PM  Result Value Ref Range   Hepatitis B Surface Ag NON REACTIVE NON REACTIVE   HCV Ab NON REACTIVE NON REACTIVE    Comment: (NOTE) Nonreactive HCV antibody screen is consistent with no HCV infections,  unless recent infection is suspected or other evidence exists to indicate HCV infection.     Hep A IgM NON REACTIVE NON REACTIVE   Hep B  C IgM NON REACTIVE NON REACTIVE    Comment: Performed at [facility], [place] [address] [place], [place] [address]    Blood Alcohol level:  Lab Results  Component Value Date   ETH <10 02/02/2021   ETH <10 [id number]    Metabolic Disorder Labs: Lab Results  Component Value Date   HGBA1C 6.0 (H) 02/03/2021   MPG 126 02/03/2021   MPG 114 02/02/2021   Lab Results  Component Value Date   PROLACTIN 23.4 (H) 11/28/2018   Lab Results  Component Value Date   CHOL 131 01/30/2021   TRIG 104 01/30/2021   HDL 30 (L) 01/30/2021   CHOLHDL 4.4 01/30/2021   VLDL 21 01/30/2021   LDLCALC 80 01/30/2021   LDLCALC 69 08/25/2019    Physical Findings:  Musculoskeletal: Strength & Muscle Tone: within normal limits Gait & Station: normal Patient leans: N/A  Psychiatric Specialty Exam:  Presentation  General Appearance: casually dressed, fair hygiene  Eye Contact: Fair - staring quality  Speech:Clear and coherent, slow  Speech Volume:Decreased  Mood and Affect  Mood: "Tired", appear aloof  Affect: blunted but laughing inappropriately on exam, guarded   Thought Process  Thought Processes:ruminative about delusional content; concrete  Orientation:Full (Time, Place and Person)   Thought Content: Denies VH but endorses AH, belief in thought control by family, belief that family can hear his thoughts; is paranoid about family; appears guarded on exam and internally preoccupied; denies ideas of reference or thought broadcasting   History of Schizophrenia/Schizoaffective  disorder:Yes  Duration of Psychotic Symptoms:Greater than six months  Hallucinations:Hallucinations: Auditory Description of Auditory Hallucinations: AH of step brother saying "his" when pt picks up stuff. Exacerbated when alone.  Ideas of Reference:None   Suicidal Thoughts:Suicidal Thoughts: No  Homicidal Thoughts:Homicidal Thoughts: No   Sensorium  Memory:Immediate Fair; Recent Fair; Remote Fair  Judgment: Fair - compliant with meds  Insight:Lacking   Executive Functions  Concentration:Fair  Attention Span:Fair  [place]   Psychomotor Activity  Psychomotor Activity: Involuntary movement laterally of lower jaw with distraction-minimal, nearly gone; no puckering, lip smacking or involuntary tongue movements,  no truncal or extremity involuntary movements noted; no cogwheeling bilateral UE; no tremors noted in extremities   Assets  Assets:Social Support; Leisure Time   Sleep  7hr   Physical Exam: Vitals:   02/08/21 1617 02/09/21 0611 02/09/21 0613 02/09/21 1621  BP: (!) 138/99 117/85 122/84 120/82  Pulse: (!) 108 (!) 51 76 86  Temp:  97.8 F (36.6 C)    Resp:      Height:      Weight:      SpO2:   99%   TempSrc:  Oral    BMI (Calculated):         Physical Exam Vitals and nursing note reviewed.  Constitutional:      General: He is awake. He is not in acute distress.    Appearance: He is not ill-appearing or diaphoretic.  HENT:     Head: Normocephalic.  Pulmonary:     Effort: Pulmonary effort is normal.  Neurological:     General: No focal deficit present.     Mental Status: He is alert.  Psychiatric:        Behavior: Behavior is cooperative.    Review of Systems  Respiratory:  Negative for shortness of breath.   Cardiovascular:  Negative for chest pain.  Gastrointestinal:  Negative for nausea and vomiting.  Neurological:  Negative for dizziness  and headaches.    Treatment Plan & Assessment  Summary: Principal Problem:   Schizoaffective disorder, bipolar type ([place]) Active Problems:   Homeless   Tardive dyskinesia  PLAN: Schizoaffective d/o bipolar type (r/o schizophrenia) Patient c/o drowsiness this AM.Patient still exhibits lingering psychotic sxs per above S/p Haldol Decanoate 100 mg IM (01/29/2021) Increased Abilify 10 mg to 15 mg daily  QTc 498ms on 02/03/21 and 349ms on 02/09/21, A1c 6.0, Lipids WNL other than HDL 30 Holding stimulant and advised not to restart Adderall    Tardive Dyskinesia and EPS Improved lateral jaw movement, very subtle when patient is distracted, patient does not notice. Continued Cogentin 1 mg bid- added Colace 100mg  daily PRN for constipation if needed with dose increase in Cogentin Continued Ingrezza 40 mg daily   Prediabetes A1c 6.0 Recommended follow-up with PCP after discharge Will encourage lifestyle modification Will consider metformin if patient is amenable, however unsure of med adherence after dc given hx and disease  Hep B SAg (+) Consistent with Hep B vaccination immunity status. Hep B labs from 11/03/20 at [facility] showed Hep B SAg (+), however patient received HEP B #2 on [facility]. HepB labs were negative on 07/27/20. Cannot rule out past infection of HBV that he has recovered from. Given HepB Core Ab and HepB S Ag are NR (2/1), patient does not have active HepB infection. F/u Hep B DNA PCR for confirmation of no active infection Acute hepatitis panel also negative on 02/09/21 for Hep C Ab and Hep A IgM  Dispo: Patient, No Pcp Per (Inactive)  Dispo: homeless shelter (Patient was living with father prior to admission) Pending clinical improvement IVC'd by father Win Raymundo [phone] Will have patient f/u with medication assistance program for Ingrezza  Patient's next Haldol Decanoate 100 mg IM injection is due on 02/24/2021  Safety, monitoring and disposition  planning: The patient was seen and evaluated on the unit.  The patient's chart was reviewed and nursing notes were reviewed.  The patient's case was discussed in multidisciplinary team meeting.  Plan and drug side effects were discussed with patient who was amendable. Social work and case management to assist with discharge planning and identification of hospital follow-up needs prior to discharge. Discharge Concerns: Need to establish a safety plan; Medication compliance and effectiveness Discharge Goals: Return home with outpatient referrals for mental health follow-up including medication management/psychotherapy Safety and Monitoring: Involuntary status at inpatient psychiatric unit for safety, stabilization and treatment Daily contact with patient to assess and evaluate symptoms and progress in treatment and medical management Patient's case to be discussed in multi-disciplinary team meeting Observation Level: Per above Vital signs:  q12 hours Precautions: suicide   Signed: Blythe Stanford Psychiatry Resident, PGY-1 [place] 02/10/2021, 5:04 PM

## 2021-02-10 NOTE — Progress Notes (Addendum)
°  On assessment, pt is lying in bed, curled up on his right side, and covered with blanket. Pt complains of being tired. Pt is isolative but denies anxiety and depression.  However, his mood and affect appear flat, sad, withdrawn and suspicious. Pt reports that he attended recreation today and played basketball.  Pt responded with a smile when asked if he got a lot of hoops.  Pt denies SI/HI/VH and verbally contracts for safety. Pt endorses AH, but when asked for specifics, pt responded stating, "they ask me that same question over and over again and it hasn't changed."  PRN Trazodone x 2 was administered for sleep per [facility] per pt request.  PRN Tylenol was administered for 10/10 Left shoulder pain per [facility] per pt request. MHT reports that doing her check, pt was found sleeping on the floor on the side of his bed rather than in his bed.  Pt remains safe on the unit with Q 15 minute safety checks.   02/10/21 2124  Psych Admission Type (Psych Patients Only)  Admission Status Involuntary  Psychosocial Assessment  Patient Complaints Other (Comment) (Tired)  Eye Contact Brief;Avoids  Facial Expression Flat;Pensive;Sad  Affect Flat  Speech Logical/coherent;Soft;Slow  Interaction Forwards little;Minimal  Motor Activity Slow  Appearance/Hygiene Unremarkable  Behavior Characteristics Cooperative;Appropriate to situation  Mood Suspicious  Thought Process  Coherency WDL  Content Paranoia  Delusions Paranoid  Perception Hallucinations  Hallucination Auditory  Judgment Limited  Confusion None  Danger to Self  Current suicidal ideation? Denies  Danger to Others  Danger to Others None reported or observed

## 2021-02-11 DIAGNOSIS — F250 Schizoaffective disorder, bipolar type: Secondary | ICD-10-CM | POA: Diagnosis not present

## 2021-02-11 MED ORDER — ARIPIPRAZOLE 10 MG PO TABS
20.0000 mg | ORAL_TABLET | Freq: Every day | ORAL | Status: DC
  Administered 2021-02-12 – 2021-02-13 (×2): 20 mg via ORAL
  Filled 2021-02-11 (×5): qty 2

## 2021-02-11 MED ORDER — ARIPIPRAZOLE 15 MG PO TABS
30.0000 mg | ORAL_TABLET | Freq: Every day | ORAL | Status: DC
  Filled 2021-02-11 (×2): qty 2

## 2021-02-11 NOTE — Progress Notes (Signed)
Patient did attend the evening speaker AA meeting.  

## 2021-02-11 NOTE — Group Note (Signed)
LCSW Group Therapy Note   Group Date: 02/11/2021 Start Time: 1300 End Time: 1400   Type of Therapy and Topic:  Group Therapy: Boundaries  Participation Level:  Active  Description of Group: This group will address the use of boundaries in their personal lives. Patients will explore why boundaries are important, the difference between healthy and unhealthy boundaries, and negative and postive outcomes of different boundaries and will look at how boundaries can be crossed.  Patients will be encouraged to identify current boundaries in their own lives and identify what kind of boundary is being set. Facilitators will guide patients in utilizing problem-solving interventions to address and correct types boundaries being used and to address when no boundary is being used. Understanding and applying boundaries will be explored and addressed for obtaining and maintaining a balanced life. Patients will be encouraged to explore ways to assertively make their boundaries and needs known to significant others in their lives, using other group members and facilitator for role play, support, and feedback.  Therapeutic Goals:  1.  Patient will identify areas in their life where setting clear boundaries could be  used to improve their life.  2.  Patient will identify signs/triggers that a boundary is not being respected. 3.  Patient will identify two ways to set boundaries in order to achieve balance in  their lives: 4.  Patient will demonstrate ability to communicate their needs and set boundaries  through discussion and/or role plays  Summary of Patient Progress:  Pt was present/active throughout the session and proved open to feedback from CSW and peers. Patient demonstrated insight into the subject matter, was respectful of peers, and was present throughout the entire session.  Therapeutic Modalities:   Cognitive Behavioral Therapy Solution-Focused Therapy  Felizardo Hoffmann, Theresia Majors 02/11/2021  1:22 PM

## 2021-02-11 NOTE — Progress Notes (Addendum)
°  On assessment, pt observed in milieu at [facility] meeting.  Pt is sitting calmly in chair looking away from presenters but appears to be listening.  Pt endorses moderate anxiety and auditory hallucinations.  Pt denies SI/HI/VH.  Pt verbally contracts for safety.  Administered PRN Hydroxyzine for anxiety and PRN Trazodone for sleep per [facility] per pt request.  Pt remains safe on unit with Q 15 minute safety checks.    02/11/21 2156  Psych Admission Type (Psych Patients Only)  Admission Status Involuntary  Psychosocial Assessment  Patient Complaints Anxiety;Depression  Eye Contact Brief;Avoids  Facial Expression Flat;Pensive;Sad  Affect Flat  Speech Logical/coherent;Soft;Slow  Interaction Forwards little;Minimal  Motor Activity Slow  Appearance/Hygiene Unremarkable  Behavior Characteristics Cooperative;Anxious  Mood Depressed;Anxious;Suspicious  Thought Process  Coherency WDL  Content Paranoia  Delusions Paranoid  Perception Hallucinations  Hallucination Auditory  Judgment Limited  Confusion None  Danger to Self  Current suicidal ideation? Denies  Danger to Others  Danger to Others None reported or observed

## 2021-02-11 NOTE — BHH Group Notes (Signed)
Pt  attended and contributed to group discussion. °

## 2021-02-11 NOTE — Progress Notes (Addendum)
[facility] MD Progress Note  02/11/2021 12:20 PM Wesley Peterson  MRN: [id number]  CC: acute psychosis  Subjective: Wesley Peterson is a 36 y.o. male with PPHx schizophrenia vs schizoaffective d/o who presented to [facility] ED for acute psychosis, agitation and concern for HI. He was then admitted involuntarily to [facility] for treatment of psychosis exacerbated by poor therapeutic compliance. [place] Day 9   Interim History: Case was discussed in team meeting and nursing notes were reviewed. The patient had no acute behavioral issues noted or safety concerns noted. He refused additional Abilify 5 mg yesterday, however was compliant with the rest of his medicines.Per nursing, he was isolative, suspicious, and withdrawn appearing. He did attend rec therapy gym time but otherwise did not attend groups.  Patient was evaluated this AM, initially patient was seen folding his clothes and putting them away after a shower. Patient reported that [place] are still present, although noted that they are mildly quieter and less bothersome compared to when he first arrived. However he reported no change in frequency and still believed that they are real. Patient was still paranoid that his family was the cause for his behavior at home, and reported that his family is probably able to hear his thoughts and the thoughts of people here as well.  However today he denied that his family is inserting thoughts into his head or controlling his mind.  He also denied ideas of reference.  He reported that he is tolerating the Abilify well, he denied restlessness or any new or worsening somatic symptoms that he noted.  Discussed with him that the Abilify is supposed to help him with the voices, was amenable to titrating upward. Patient reported that his stiff muscles are slowly improving, he denied any involuntary movements, difficulty swallowing, or tremors. He denied noticing any involuntary jaw movements. Patient reported that he still has  some daytime somnolence. He reported stable sleep, with a few middle of the night awakenings where he notices the PM team checking up on the patients. Discussed with patient the importance of attending groups, going to the cafeteria for meals, and not staying in bed for good hygiene and improved quality of sleep at night.  Patient was agreeable, so he will stay out of bed during the day. He reported stable mood and appetite. Patient denied SI/HI/VH and contracted to safety on the unit.   Diagnosis:  Principal Problem:   Schizoaffective disorder, bipolar type ([place]) Active Problems:   Homeless   Tardive dyskinesia  Past Psychiatric History: See H&P  Past Medical History:  Past Medical History:  Diagnosis Date   Schizophrenia ([place])    Schizophrenia, paranoid type ([place]) 02/02/2021   Family History: see H&P  Family Psychiatric  History: See H&P  Social History:  Social History   Substance and Sexual Activity  Alcohol Use Not Currently   Alcohol/week: 1.0 standard drink   Types: 1 Cans of beer per week   Comment: occasional     Social History   Substance and Sexual Activity  Drug Use Never    Social History   Socioeconomic History   Marital status: Single    Spouse name: Not on file   Number of children: 1   Years of education: Not on file   Highest education level: Not on file  Occupational History   Occupation: Unemployed  Tobacco Use   Smoking status: Never   Smokeless tobacco: Never  Vaping Use   Vaping Use: Never used  Substance and  Sexual Activity   Alcohol use: Not Currently    Alcohol/week: 1.0 standard drink    Types: 1 Cans of beer per week    Comment: occasional   Drug use: Never   Sexual activity: Never  Other Topics Concern   Not on file  Social History Narrative   ** Merged History Encounter **       Pt lives with mother and other relatives.  Currently unemployed.  [place] outpatient psychiatry services through [facility]   Social Determinants of  Health   Financial Resource Strain: Not on file  Food Insecurity: Not on file  Transportation Needs: Not on file  Physical Activity: Not on file  Stress: Not on file  Social Connections: Not on file    Sleep: Improved  Appetite: Good  Current Medications: Current Facility-Administered Medications  Medication Dose Route Frequency Provider Last Rate Last Admin   acetaminophen (TYLENOL) tablet 650 mg  650 mg Oral Q6H PRN Ethelene Hal, NP   650 mg at 02/10/21 2301   alum & mag hydroxide-simeth (MAALOX/MYLANTA) 200-200-20 MG/5ML suspension 30 mL  30 mL Oral Q4H PRN Ethelene Hal, NP       Derrill Memo ON 02/12/2021] ARIPiprazole (ABILIFY) tablet 30 mg  30 mg Oral Daily Merrily Brittle, DO       benztropine (COGENTIN) tablet 1 mg  1 mg Oral BID Nelda Marseille, Euleta Belson E, MD   1 mg at 02/11/21 [id number]   docusate sodium (COLACE) capsule 100 mg  100 mg Oral Daily PRN Harlow Asa, MD       hydrOXYzine (ATARAX) tablet 25 mg  25 mg Oral TID PRN Ethelene Hal, NP   25 mg at 02/09/21 0838   OLANZapine zydis (ZYPREXA) disintegrating tablet 5 mg  5 mg Oral Q8H PRN Merrily Brittle, DO       And   LORazepam (ATIVAN) tablet 1 mg  1 mg Oral PRN Merrily Brittle, DO       And   ziprasidone (GEODON) injection 20 mg  20 mg Intramuscular PRN Merrily Brittle, DO       magnesium hydroxide (MILK OF MAGNESIA) suspension 30 mL  30 mL Oral Daily PRN Ethelene Hal, NP       traZODone (DESYREL) tablet 50 mg  50 mg Oral QHS PRN Harlow Asa, MD   50 mg at 02/10/21 2124   valbenazine (INGREZZA) capsule 40 mg  40 mg Oral Daily Harlow Asa, MD   40 mg at 02/11/21 [id number]    Lab Results:  Results for orders placed or performed during the hospital encounter of 02/02/21 (from the past 53 hour(s))  Hepatitis panel, acute     Status: None   Collection Time: 02/09/21  6:34 PM  Result Value Ref Range   Hepatitis B Surface Ag NON REACTIVE NON REACTIVE   HCV Ab NON REACTIVE NON REACTIVE    Comment:  (NOTE) Nonreactive HCV antibody screen is consistent with no HCV infections,  unless recent infection is suspected or other evidence exists to indicate HCV infection.     Hep A IgM NON REACTIVE NON REACTIVE   Hep B C IgM NON REACTIVE NON REACTIVE    Comment: Performed at [facility], [place] [address], [place], [place] [address]  Hepatitis B DNA, Ultraquantitative, PCR     Status: None   Collection Time: 02/09/21  6:34 PM  Result Value Ref Range   HBV DNA SERPL PCR-ACNC HBV DNA not detected IU/mL   HBV  DNA SERPL PCR-LOG IU UNABLE TO CALCULATE log10 IU/mL    Comment: (NOTE) Unable to calculate result since non-numeric result obtained for component test.    Test Info: Comment     Comment: (NOTE) The reportable range for this assay is 10 IU/mL to 1 billion IU/mL. Performed At: [facility] [place], [place] [id number] Rush Farmer MD [id number]     Blood Alcohol level:  Lab Results  Component Value Date   [facility] <10 02/02/2021   ETH <10 [id number]    Metabolic Disorder Labs: Lab Results  Component Value Date   HGBA1C 6.0 (H) 02/03/2021   MPG 126 02/03/2021   MPG 114 02/02/2021   Lab Results  Component Value Date   PROLACTIN 23.4 (H) 11/28/2018   Lab Results  Component Value Date   CHOL 131 01/30/2021   TRIG 104 01/30/2021   HDL 30 (L) 01/30/2021   CHOLHDL 4.4 01/30/2021   VLDL 21 01/30/2021   LDLCALC 80 01/30/2021   LDLCALC 69 08/25/2019    Physical Findings:  Musculoskeletal: Strength & Muscle Tone: within normal limits Gait & Station: normal Patient leans: N/A  Psychiatric Specialty Exam:  Presentation  General Appearance: casually dressed, fair hygiene  Eye Contact: Minimal - staring quality  Speech:Clear and coherent, slow  Speech Volume:Decreased  Mood and Affect  Mood: "Tired", appears aloof  Affect: blunted and guarded, laughs inappropriately at times   Thought Process  Thought Processes:more linear today  but concrete  Orientation:Full (Time, Place and Person)  Thought Content: Endorsed AH, thought broadcasting to his family only. Paranoid delusions towards family. He still appeared internally preoccupied during evaluation, staring and laughing inappropriately. Denied VH, thought insertion, family controlling his mind, ideas of reference.   History of Schizophrenia/Schizoaffective disorder:Yes  Duration of Psychotic Symptoms:Greater than six months  Hallucinations:AH says "theirs, his"   Ideas of Reference:None   Suicidal Thoughts:None  Homicidal Thoughts:None   Sensorium  Memory:Immediate Fair; Recent Fair; Remote Fair  Judgment: Fair - compliant with meds  Insight:Lacking   Executive Functions  Concentration:Fair  Attention Span:Fair  [place]   Psychomotor Activity  Psychomotor Activity: Involuntary movement laterally of lower jaw with distraction-minimal, nearly gone; no puckering, lip smacking or involuntary tongue movements, no truncal or extremity involuntary movements noted; no cogwheeling bilateral UE; no tremors noted in extremities  AIMS 2   Assets  Assets:Social Support; Leisure Time   Sleep  Total time unrecorded    Physical Exam: Vitals:   02/09/21 0611 02/09/21 0613 02/09/21 1621 02/10/21 1713  BP: 117/85 122/84 120/82 115/78  Pulse: (!) 51 76 86 63  Temp: 97.8 F (36.6 C)     Resp:      Height:      Weight:      SpO2:  99%  100%  TempSrc: Oral     BMI (Calculated):         Physical Exam Vitals and nursing note reviewed.  Constitutional:      General: He is awake. He is not in acute distress.    Appearance: He is not ill-appearing or diaphoretic.  HENT:     Head: Normocephalic.  Pulmonary:     Effort: Pulmonary effort is normal.  Neurological:     General: No focal deficit present.     Mental Status: He is alert.  Psychiatric:        Behavior: Behavior is cooperative.    Review  of Systems  Respiratory:  Negative for  shortness of breath.   Cardiovascular:  Negative for chest pain.  Gastrointestinal:  Negative for nausea and vomiting.  Neurological:  Negative for dizziness and headaches.    Treatment Plan & Assessment Summary: Principal Problem:   Schizoaffective disorder, bipolar type ([place]) Active Problems:   Homeless   Tardive dyskinesia  PLAN: Schizoaffective d/o bipolar type (r/o schizophrenia) Patient is still psychotic, however reported that his AH (non-command) are quieter and less bothersome compared to admission.  He is still paranoia towards family and that they can read his mind. Still appeared internally preoccupied exhibited inappropriate laughing. Patient has been tolerating Abilify well, with no akathisia so far, he will likely tolerate continue upward titration. S/p Haldol Decanoate 100 mg IM (01/29/2021) Increased Abilify 15 mg to 20 mg daily tomorrow for residual symptoms QTc 441ms on 02/03/21 and 373ms on 02/09/21, A1c 6.0, Lipids WNL other than HDL 30 Holding stimulant and advised not to restart Adderall    Tardive Dyskinesia and EPS Improved lateral jaw movement, very subtle and emerges only during distraction, patient does not notice. Continued Cogentin 1 mg bid- added Colace 100mg  daily PRN for constipation if needed with dose increase in Cogentin Continued Ingrezza 40 mg daily   Prediabetes A1c 6.0 Recommended follow-up with PCP after discharge Continued to encourage lifestyle modification Will consider metformin if patient is amenable, however unsure of med adherence after dc given hx and disease  Hep B SAg (+) Consistent with Hep B vaccination immunity status. Hep B labs from 11/03/20 at [facility] showed Hep B SAg (+), however patient received HEP B #2 on 09/16/20 at [facility]. HepB labs were negative on 07/27/20. Cannot rule out past infection of HBV that he has recovered from. Given HepB Core  Ab and HepB S Ag are NR (2/1), patient does not have active HepB infection. Hep B DNA PCR showed no HBV DNA detected. Acute hepatitis panel also negative on 02/09/21 for Hep C Ab and Hep A IgM  Dispo: Patient, No Pcp Per (Inactive)  Dispo: homeless shelter (Patient was living with father prior to admission) Pending clinical improvement IVC'd by father Gaby Walenta [phone] Will have patient f/u with medication assistance program for Ingrezza  Patient's next Haldol Decanoate 100 mg IM injection is due on 02/24/2021  Safety, monitoring and disposition planning: The patient was seen and evaluated on the unit.  The patient's chart was reviewed and nursing notes were reviewed.  The patient's case was discussed in multidisciplinary team meeting.  Plan and drug side effects were discussed with patient who was amendable. Social work and case management to assist with discharge planning and identification of hospital follow-up needs prior to discharge. Discharge Concerns: Need to establish a safety plan; Medication compliance and effectiveness Discharge Goals: Return home with outpatient referrals for mental health follow-up including medication management/psychotherapy Safety and Monitoring: Involuntary status at inpatient psychiatric unit for safety, stabilization and treatment Daily contact with patient to assess and evaluate symptoms and progress in treatment and medical management Patient's case to be discussed in multi-disciplinary team meeting Observation Level: Per above Vital signs:  q12 hours Precautions: suicide   Signed: Blythe Stanford Psychiatry Resident, PGY-1 [facility] 02/11/2021, 12:20 PM

## 2021-02-12 DIAGNOSIS — F250 Schizoaffective disorder, bipolar type: Secondary | ICD-10-CM | POA: Diagnosis not present

## 2021-02-12 MED ORDER — MELATONIN 3 MG PO TABS
3.0000 mg | ORAL_TABLET | Freq: Every day | ORAL | Status: DC
  Administered 2021-02-12 – 2021-02-20 (×9): 3 mg via ORAL
  Filled 2021-02-12: qty 7
  Filled 2021-02-12: qty 1
  Filled 2021-02-12: qty 7
  Filled 2021-02-12 (×9): qty 1

## 2021-02-12 NOTE — Progress Notes (Addendum)
[facility] MD Progress Note  02/12/2021 6:05 PM Wesley Peterson  MRN: [id number]  CC: acute psychosis  Subjective: Wesley Peterson is a 36 y.o. male with PPHx schizophrenia vs schizoaffective d/o who presented to [facility] ED for acute psychosis, agitation and concern for HI. He was then admitted involuntarily to [facility] for treatment of psychosis exacerbated by poor therapeutic compliance. [place] Day 10   Interim History: Case was discussed in team meeting and nursing notes were reviewed. The patient had no acute behavioral issues noted or safety concerns noted. He was compliant with scheduled meds. He attend attend groups.  Patient was evaluated this AM in the group room on [address]. Patient appeared more alert. Prior to interview, patient reported having 4-5 cups of coffee to stay awake during morning groups and requested that he have something like his Adderall at home, to help him stay awake.  He stated that the caffeine helps him become more social and talkative.  He reported that his sleep is poor quality and reported poor sleep latency.  In discussing with him the importance of sleep hygiene, patient admitted to drinking 8 PM coffee for the past 3 days.  Discussed with patient that any caffeine after lunch will disrupt his sleep, making him fatigued next day.  He reported decreased frequency in AH, mainly noting residual when he went to the restroom this a.m.  Denied any when he is about on the unit and during groups.Patient continued to endorse paranoia towards family, stating that they must put something into his head to make him "tweak out" prior to admission.  Today patient was able to recall more of the events prior to admission and described them.  Patient reported taking Adderall twice a day every day. Prior to admission his dad hid his Adderall. Patient then reported remembering just going downstairs and "being belligerent and freaking out".  Patient adamantly believes that his family must have  done something to his mind prior to admission but then denies belief in thought control or thought insertion today.Patient reported feeling "jittery". Patient reported that his stiff muscles continue to slowly improve, he denied any involuntary movements, difficulty swallowing.  Patient reported noticing involuntary jaw movement when asked about it but stated that he does not mind it.  He reported mild anxiety and decreased appetite at lunch.  Said he was able to have breakfast. Patient denied SI/HI/VH, thought broadcasting, ideas of reference and contracted for safety on the unit.  Patient was agreeable to plan and had no other concerns.  Diagnosis:  Principal Problem:   Schizoaffective disorder, bipolar type ([place]) Active Problems:   Homeless   Tardive dyskinesia  Past Psychiatric History: See H&P  Past Medical History:  Past Medical History:  Diagnosis Date   Schizophrenia ([place])    Schizophrenia, paranoid type ([place]) 02/02/2021   Family History: see H&P  Family Psychiatric  History: See H&P  Social History:  Social History   Substance and Sexual Activity  Alcohol Use Not Currently   Alcohol/week: 1.0 standard drink   Types: 1 Cans of beer per week   Comment: occasional     Social History   Substance and Sexual Activity  Drug Use Never    Social History   Socioeconomic History   Marital status: Single    Spouse name: Not on file   Number of children: 1   Years of education: Not on file   Highest education level: Not on file  Occupational History   Occupation: Unemployed  Tobacco Use   Smoking status: Never   Smokeless tobacco: Never  Vaping Use   Vaping Use: Never used  Substance and Sexual Activity   Alcohol use: Not Currently    Alcohol/week: 1.0 standard drink    Types: 1 Cans of beer per week    Comment: occasional   Drug use: Never   Sexual activity: Never  Other Topics Concern   Not on file  Social History Narrative   ** Merged History Encounter **        Pt lives with mother and other relatives.  Currently unemployed.  [place] outpatient psychiatry services through [facility].   Social Determinants of Health   Financial Resource Strain: Not on file  Food Insecurity: Not on file  Transportation Needs: Not on file  Physical Activity: Not on file  Stress: Not on file  Social Connections: Not on file    Sleep: Improved  Appetite: Good  Current Medications: Current Facility-Administered Medications  Medication Dose Route Frequency Provider Last Rate Last Admin   acetaminophen (TYLENOL) tablet 650 mg  650 mg Oral Q6H PRN Ethelene Hal, NP   650 mg at 02/10/21 2301   alum & mag hydroxide-simeth (MAALOX/MYLANTA) 200-200-20 MG/5ML suspension 30 mL  30 mL Oral Q4H PRN Ethelene Hal, NP       ARIPiprazole (ABILIFY) tablet 20 mg  20 mg Oral Daily Nelda Marseille, Zaheer Wageman E, MD   20 mg at 02/12/21 0820   benztropine (COGENTIN) tablet 1 mg  1 mg Oral BID Viann Fish E, MD   1 mg at 02/12/21 0820   docusate sodium (COLACE) capsule 100 mg  100 mg Oral Daily PRN Harlow Asa, MD       hydrOXYzine (ATARAX) tablet 25 mg  25 mg Oral TID PRN Ethelene Hal, NP   25 mg at 02/12/21 1636   OLANZapine zydis (ZYPREXA) disintegrating tablet 5 mg  5 mg Oral Q8H PRN Merrily Brittle, DO       And   LORazepam (ATIVAN) tablet 1 mg  1 mg Oral PRN Merrily Brittle, DO       And   ziprasidone (GEODON) injection 20 mg  20 mg Intramuscular PRN Merrily Brittle, DO       magnesium hydroxide (MILK OF MAGNESIA) suspension 30 mL  30 mL Oral Daily PRN Ethelene Hal, NP       melatonin tablet 3 mg  3 mg Oral QHS Merrily Brittle, DO       traZODone (DESYREL) tablet 50 mg  50 mg Oral QHS PRN Harlow Asa, MD   50 mg at 02/11/21 2156   valbenazine (INGREZZA) capsule 40 mg  40 mg Oral Daily Viann Fish E, MD   40 mg at 02/12/21 0820    Lab Results:  No results found for this or any previous visit (from the past 3 hour(s)).   Blood Alcohol level:   Lab Results  Component Value Date   ETH <10 02/02/2021   ETH <10 [id number]    Metabolic Disorder Labs: Lab Results  Component Value Date   HGBA1C 6.0 (H) 02/03/2021   MPG 126 02/03/2021   MPG 114 02/02/2021   Lab Results  Component Value Date   PROLACTIN 23.4 (H) 11/28/2018   Lab Results  Component Value Date   CHOL 131 01/30/2021   TRIG 104 01/30/2021   HDL 30 (L) 01/30/2021   CHOLHDL 4.4 01/30/2021   VLDL 21 01/30/2021   LDLCALC 80 01/30/2021   LDLCALC 69  08/25/2019    Physical Findings:  Musculoskeletal: Strength & Muscle Tone: within normal limits Gait & Station: normal Patient leans: N/A  Psychiatric Specialty Exam:  Presentation  General Appearance: casually dressed, fair hygiene  Eye Contact: Fair, improved  Speech: Clear and coherent, normal rate  Speech Volume:Decreased  Mood and Affect  Mood: "Tired and anxious"  Affect: Blunted, less paranoid   Thought Process  Thought Processes: more linear, but concrete  Orientation:Full (Time, Place and Person)  Thought Content: Endorsed AH. Paranoid/persecutory delusions about family. Did not appear to be responding internally, did not laugh inappropriately. Denied VH, thought broadcasting, family controlling his mind, thought insertion on the unit, or ideas of reference. Less paranoid appearing  History of Schizophrenia/Schizoaffective disorder:Yes  Duration of Psychotic Symptoms:Greater than six months  Hallucinations:AH says "theirs, his"-decreased frequency  Ideas of Reference:None   Suicidal Thoughts:None  Homicidal Thoughts:None   Sensorium  Memory:Immediate Fair; Recent Fair; Remote Fair  Judgment: Fair - compliant with meds  Insight:Lacking   Executive Functions  Concentration:Fair  Attention Span:Fair  [place]   Psychomotor Activity  Psychomotor Activity: Involuntary movement laterally of lower jaw with  distraction-minimal, nearly gone; no puckering, lip smacking or involuntary tongue movements, no truncal or extremity involuntary movements noted; no cogwheeling bilateral UE; no tremors noted in extremities  AIMS 3  Assets  Assets:Social Support; Leisure Time   Sleep  Total time unrecorded  Physical Exam: Vitals:   02/09/21 1621 02/10/21 1713 02/11/21 1717 02/12/21 1628  BP: 120/82 115/78 125/81 (!) 146/98  Pulse: 86 63 79 (!) 120  Temp:   98.2 F (36.8 C) 99.2 F (37.3 C)  Resp:      Height:      Weight:      SpO2:  100% 99% 96%  TempSrc:   Oral Oral  BMI (Calculated):         Physical Exam Vitals and nursing note reviewed.  Constitutional:      General: He is awake. He is not in acute distress.    Appearance: He is not ill-appearing or diaphoretic.  HENT:     Head: Normocephalic.  Pulmonary:     Effort: Pulmonary effort is normal.  Neurological:     General: No focal deficit present.     Mental Status: He is alert.  Psychiatric:        Behavior: Behavior is cooperative.    Review of Systems  Respiratory:  Negative for shortness of breath.   Cardiovascular:  Negative for chest pain.  Gastrointestinal:  Negative for nausea and vomiting.  Neurological:  Negative for dizziness and headaches.    Treatment Plan & Assessment Summary: Principal Problem:   Schizoaffective disorder, bipolar type ([place]) Active Problems:   Homeless   Tardive dyskinesia  PLAN: Schizoaffective d/o bipolar type (r/o schizophrenia) Patient's psychosis has improved, with decreased frequency in AH (non-command) that are limited to this AM when he went to the bathroom since med change. He also did not appear to be internally responding, nor laughed inappropriately during evaluation.  However he continued to endorse paranoid delusions.   S/p Haldol Decanoate 100 mg IM (01/29/2021) Continued Abilify 20 mg daily, for psychosis QTc 454ms on 02/03/21 and 356ms on 02/09/21, A1c 6.0, Lipids WNL other  than HDL 30 Holding stimulant and advised not to restart Adderall  Care order: No coffee after lunch   Tardive Dyskinesia and EPS Minimal lateral jaw movement, very subtle and emerges only during distraction, patient noticed when  asked, but is unbothered. AIMS 3 Continued Cogentin 1 mg bid- added Colace 100mg  daily PRN for constipation if needed with dose increase in Cogentin Continued Ingrezza 40 mg daily   Prediabetes A1c 6.0 Recommended follow-up with PCP after discharge Continued to encourage lifestyle modification Will consider metformin if patient is amenable, however unsure of med adherence after dc given hx and disease  Hep B SAg (+) Consistent with Hep B vaccination immunity status. Hep B labs from 11/03/20 at [facility] showed Hep B SAg (+), however patient received HEP B #2 on 09/16/20 at [facility]. HepB labs were negative on 07/27/20. Cannot rule out past infection of HBV that he has recovered from. Given HepB Core Ab and HepB S Ag are NR (2/1), patient does not have active HepB infection. Hep B DNA PCR showed no HBV DNA detected. Acute hepatitis panel also negative on 02/09/21 for Hep C Ab and Hep A IgM  Dispo: Patient, No Pcp Per (Inactive)  Dispo: homeless shelter (Patient was living with father prior to admission) Pending clinical improvement IVC'd by father Wesley Peterson [phone] Will have patient f/u with medication assistance program for Ingrezza  Patient's next Haldol Decanoate 100 mg IM injection is due on 02/24/2021  Safety, monitoring and disposition planning: The patient was seen and evaluated on the unit.  The patient's chart was reviewed and nursing notes were reviewed.  The patient's case was discussed in multidisciplinary team meeting.  Plan and drug side effects were discussed with patient who was amendable. Social work and case management to assist with discharge planning and identification of hospital follow-up  needs prior to discharge. Discharge Concerns: Need to establish a safety plan; Medication compliance and effectiveness Discharge Goals: Return home with outpatient referrals for mental health follow-up including medication management/psychotherapy Safety and Monitoring: Involuntary status at inpatient psychiatric unit for safety, stabilization and treatment Daily contact with patient to assess and evaluate symptoms and progress in treatment and medical management Patient's case to be discussed in multi-disciplinary team meeting Observation Level: Per above Vital signs:  q12 hours Precautions: suicide   Signed: Blythe Stanford Psychiatry Resident, PGY-1 [place] 02/12/2021, 6:05 PM

## 2021-02-12 NOTE — [facility] Group Notes (Signed)
Psychoeducational Group Note    Date:02/12/21 Time: 1300-1400    Purpose of Group: . The group focus' on teaching patients on how to identify their needs and their Life Skills:  A group where two lists are made. What people need and what are things that we do that are unhealthy. The lists are developed by the patients and it is explained that we often do the actions that are not healthy to get our list of needs met.  Goal:: to develop the coping skills needed to get their needs met  Participation Level:  Active  Participation Quality:  Appropriate  Affect:  Appropriate  Cognitive:  Oriented  Insight:  Improving  Engagement in Group:  Engaged  Additional Comments: Attended the grouop, but did not volunteer any information in the group.  Paulino Rily

## 2021-02-12 NOTE — Progress Notes (Signed)
Adult Psychoeducational Group Note  Date:  02/12/2021 Time:  1:48 PM  Group Topic/Focus:  Goals Group:   The focus of this group is to help patients establish daily goals to achieve during treatment and discuss how the patient can incorporate goal setting into their daily lives to aide in recovery.  Participation Level:  Active  Participation Quality:  Appropriate  Affect:  Appropriate  Cognitive:  Appropriate  Insight: Appropriate  Engagement in Group:  Engaged  Modes of Intervention:  Discussion  Additional Comments: Patient attended morning orientation/goal group and participated.   Brayleigh Rybacki W Reed Eifert 02/12/2021, 1:48 PM

## 2021-02-12 NOTE — Group Note (Signed)
LCSW Group Therapy Note ° °No therapy group could be held today due to other needs on the unit that were prioritized.  The patient did not go without group, as another licensed group was held by an RN. ° °Phillippa Straub Grossman-Orr, LCSW °10/30/2020 °10:05 AM  °   °

## 2021-02-12 NOTE — Plan of Care (Signed)
  Problem: Education: Goal: Emotional status will improve Outcome: Progressing Goal: Mental status will improve Outcome: Progressing   Problem: Activity: Goal: Interest or engagement in activities will improve Outcome: Progressing   Problem: Coping: Goal: Ability to demonstrate self-control will improve Outcome: Progressing   

## 2021-02-12 NOTE — [facility] Group Notes (Signed)
Adult Psychoeducational Group Note  Date:  02/12/2021 Time:  9:19 PM  Group Topic/Focus:  Goals Group:   The focus of this group is to help patients establish daily goals to achieve during treatment and discuss how the patient can incorporate goal setting into their daily lives to aide in recovery.  Participation Level:  Minimal  Participation Quality:  Appropriate  Affect:  Appropriate  Cognitive:  Appropriate  Insight: Appropriate  Engagement in Group:  Engaged  Modes of Intervention:  Discussion  Additional Comments  Wesley Peterson 02/12/2021, 9:19 PM

## 2021-02-12 NOTE — [facility] Group Notes (Signed)
Goals Group 02/12/2021   Group Focus: affirmation, clarity of thought, and goals/reality orientation Treatment Modality:  Psychoeducation Interventions utilized were assignment, group exercise, and support Purpose: To be able to understand and verbalize the reason for their admission to the hospital. To understand that the medication helps with their chemical imbalance but they also need to work on their choices in life. To be challenged to develop a list of 30 positives about themselves. Also introduce the concept that "feelings" are not reality.  Participation Level:  Active  Participation Quality:  Appropriate  Affect:  Appropriate  Cognitive:  Appropriate  Insight:  Improving  Engagement in Group:  Engaged  Additional Comments:   Attended the group. Listens to all that is being said but does not contribute.  Dione Housekeeper

## 2021-02-13 DIAGNOSIS — F250 Schizoaffective disorder, bipolar type: Secondary | ICD-10-CM | POA: Diagnosis not present

## 2021-02-13 MED ORDER — IBUPROFEN 600 MG PO TABS
600.0000 mg | ORAL_TABLET | Freq: Four times a day (QID) | ORAL | Status: DC | PRN
  Administered 2021-02-13 – 2021-02-20 (×11): 600 mg via ORAL
  Filled 2021-02-13 (×11): qty 1

## 2021-02-13 MED ORDER — ARIPIPRAZOLE 15 MG PO TABS
30.0000 mg | ORAL_TABLET | Freq: Every day | ORAL | Status: DC
  Administered 2021-02-14: 30 mg via ORAL
  Filled 2021-02-13 (×3): qty 2

## 2021-02-13 MED ORDER — DOCUSATE SODIUM 100 MG PO CAPS
100.0000 mg | ORAL_CAPSULE | Freq: Every day | ORAL | Status: DC
  Administered 2021-02-13 – 2021-02-14 (×2): 100 mg via ORAL
  Filled 2021-02-13 (×7): qty 1

## 2021-02-13 MED ORDER — ARIPIPRAZOLE 10 MG PO TABS
10.0000 mg | ORAL_TABLET | Freq: Once | ORAL | Status: AC
  Administered 2021-02-13: 10 mg via ORAL
  Filled 2021-02-13 (×2): qty 1

## 2021-02-13 MED ORDER — TRAZODONE HCL 100 MG PO TABS
100.0000 mg | ORAL_TABLET | Freq: Every evening | ORAL | Status: DC | PRN
  Filled 2021-02-13: qty 7

## 2021-02-13 NOTE — Progress Notes (Signed)
°   02/13/21 2323  Psych Admission Type (Psych Patients Only)  Admission Status Involuntary  Psychosocial Assessment  Patient Complaints Anxiety  Eye Contact Brief  Facial Expression Blank  Affect Flat  Speech Logical/coherent;Soft  Interaction Minimal;Forwards little  Motor Activity Slow  Appearance/Hygiene Unremarkable  Behavior Characteristics Cooperative;Appropriate to situation  Mood Suspicious  Thought Process  Coherency Blocking  Content Paranoia  Delusions Paranoid  Perception Hallucinations  Hallucination Auditory  Judgment Limited  Confusion None  Danger to Self  Current suicidal ideation? Denies  Danger to Others  Danger to Others None reported or observed   D: Patient in dayroom watching TV with peers. Pt reports he had a good still endorsing AH without command.  A: Medications administered as prescribed. Support and encouragement provided as needed.  R: Patient remains safe on the unit. Will continue to monitor for safety and stability.

## 2021-02-13 NOTE — Group Note (Signed)
[facility] LCSW Group Therapy Note  02/13/2021    Type of Therapy and Topic:  Group Therapy:   Processing Significant Event and Introduction to Supporting Self  Participation Level:  Active   Description of Group:  Patients in this group were first given the opportunity to process an event that took place in the prior group during which a fellow group member had an epileptic seizure and other patients had to immediately leave the room, do not know what happened, and will not get news of the person's recovery.  CSW compared that event to having a psychiatric crisis and asked how patients and their loved ones would react the same or differently.  There was a healthy discussion about this.  We then talked about needs for support after discharge, including the fact that we have to (1) know our own diagnosis, (2) be able to describe the symptoms of their illness, (3) be willing to figure out a way to share this with important people in our lives.  A song entitled "My Own Hero" was played which patients said inspired them to realize they must help themselves first and foremost.  WWe also talked about how to put up necessary boundaries to limit unhealthy supports from harming [place].  Therapeutic Goals: 1)  provide support about a difficult event that patients witnessed when another patient had a seizure during group just prior to this group 2)  discuss the differences and similarities between physical breakdowns and mental breakdowns  3)  identify the patient's current level of self-support  4)  elicit thoughtfulness about how to garner additional support from loves ones by providing them education about mental health diagnoses   Summary of Patient Progress:  The patient expressed that one good quality about himmself is that he can write music.  His reaction to the significant event that took place was not mentioned during group, as he was silent for the remainder of the time together.  His thoughts about self-support  were not shared.  The quality of patient's participation was to listen only.  Therapeutic Modalities:   Motivational Interviewing Processing  Lynnell Chad

## 2021-02-13 NOTE — Plan of Care (Signed)
°  Problem: Education: °Goal: Emotional status will improve °Outcome: Progressing °Goal: Mental status will improve °Outcome: Progressing °  °Problem: Activity: °Goal: Interest or engagement in activities will improve °Outcome: Progressing °  °

## 2021-02-13 NOTE — Progress Notes (Addendum)
°  On assessment, pt complains of anxiety and depression.  Pt observed out in the milieu.  Pt denies SI/HI, and verbally contracts for safety.  Pt endorses AH reporting that he hears more than one voice at a time.  Pt denies VH.  Administered PRN Trazodone for sleep per [facility] per pt request.  Pt is safe on the unit with Q 15 minute safety checks.    02/12/21 2144  Psych Admission Type (Psych Patients Only)  Admission Status Involuntary  Psychosocial Assessment  Patient Complaints Anxiety;Depression  Eye Contact Brief;Avoids  Facial Expression Flat;Pensive;Sad  Affect Flat  Speech Logical/coherent;Soft;Slow  Interaction Forwards little;Minimal  Motor Activity Slow  Appearance/Hygiene Unremarkable  Behavior Characteristics Cooperative;Anxious  Mood Depressed;Anxious;Suspicious  Thought Process  Coherency WDL  Content Paranoia  Delusions Paranoid  Perception Hallucinations  Hallucination Auditory  Judgment Limited  Confusion None  Danger to Self  Current suicidal ideation? Denies  Danger to Others  Danger to Others None reported or observed

## 2021-02-13 NOTE — [facility] Group Notes (Signed)
Psychoeducational Group Note  Date:  02/13/2021 Time:  1300-1400   Group Topic/Focus: This is a continuation of the group from Saturday. Pt's have been asked to formulate a list of 30 positives about themselves. This list is to be read 2 times a day for 30 days, looking in a mirror. Changing patterns of negative self talk. Also discussed is the fact that there have been some people who hurt [place] in the past. We keep that memory alive within [place]. Ways to cope with this are discused   Participation Level:  Active  Participation Quality:  Appropriate  Affect:  Appropriate  Cognitive:  Oriented  Insight: Improving  Engagement in Group:  Engaged  Modes of Intervention:  Activity, Discussion, Education, and Support  Additional Comments:  Pt rates his energy at a 10/10. Did not verbally participated in the group but paid close attention to what was being said.  Dione Housekeeper

## 2021-02-13 NOTE — Progress Notes (Signed)
Pt denies SI/HI/VH but endorses AH and verbally agrees to approach staff if these become apparent or before harming themselves/others. Rates depression 0/10. Rates anxiety 10/10. Rates pain 10/10. Pt has been more talkative and silly but still sleeps in his room a lot. Pt on room lock-out during meals and groups. Pt states that he has not been sleeping well due to the q 15 min checks. Pt complained of constipation and given a PRN. Scheduled medications administered to pt, per MD orders. RN provided support and encouragement to pt. Q15 min safety checks implemented and continued. Pt safe on the unit. RN will continue to monitor and intervene as needed.   02/13/21 0809  Psych Admission Type (Psych Patients Only)  Admission Status Involuntary  Psychosocial Assessment  Patient Complaints Anxiety;Other (Comment) (pain)  Eye Contact Brief;Avoids  Facial Expression Pensive;Flat;Sad;Blank  Affect Flat  Speech Logical/coherent;Soft;Slow  Interaction Assertive;Isolative;Minimal  Motor Activity Slow  Appearance/Hygiene Unremarkable  Behavior Characteristics Cooperative;Appropriate to situation;Anxious  Mood Anxious;Pleasant;Suspicious  Thought Process  Coherency Blocking  Content Paranoia  Delusions Paranoid  Perception Hallucinations  Hallucination Auditory  Judgment Limited  Confusion None  Danger to Self  Current suicidal ideation? Denies  Danger to Others  Danger to Others None reported or observed

## 2021-02-13 NOTE — Progress Notes (Addendum)
[facility] MD Progress Note  02/13/2021 7:09 AM Wesley Peterson  MRN: [id number]  CC: acute psychosis  Reason for Admission: Wesley Peterson is a 36 y.o. male with PPHx schizophrenia vs schizoaffective d/o who presented to [facility] ED for acute psychosis, agitation and concern for HI. He was then admitted involuntarily to [facility] for treatment of psychosis exacerbated by poor therapeutic compliance. [place] 11   Chart Review from last 24 hours:  The patient's chart was reviewed and nursing notes were reviewed. The patient's case was discussed in multidisciplinary team meeting. Per nursing, he endorses AH but had no acute behavioral issues or safety concerns noted. He attended groups. Per MAR he was compliant with scheduled medications and did receive Vistaril X1 for anxiety and Trazodone X1 for sleep.  Information Obtained Today During Patient Interview: The patient was seen and evaluated on the unit. On assessment today the patient reports he did not sleep well last night.  He was encouraged not to sleep during the day and to practice good sleep hygiene.  He has remained on room lockout for meals and groups and has been attending these activities.  He remains very aloof and guarded on exam and admits that he is having residual noncommand auditory hallucinations.  He states the voices is of his stepbrother and of a girl he does not recognize.  He states he predominantly hears the voices when he is in the bathroom or in the shower making comments such as "that is [place] or that is ours."  He denies visual hallucinations, SI or HI.  When questioned about belief in thought control or thought insertion he admits that he believes his family "knows what I am going to do or answer" before I say or do things.  When asked about paranoid thoughts he denies any paranoia on the unit but admits that he does have worry that family may be conspiring against him.  When asked as to why he feels this way, he states they "are  jealous of my music and want to take my strength."  He then laughs inappropriately and derails into discussion about an event that occurred prior to admission.  He states that he was on his phone talking to a girl in a sexual nature when he got suspicious and went downstairs and checked on his family. He states he walked in on his brother and stepmother each on their phones talking to other people also in a sexual way.  He states this makes him question if there is some sexual conspiracy going on with his family that he does not understand.  He denies ideas of reference and voices no physical complaints other than some residual left shoulder strain.  He requests a higher dose of Motrin for pain.  He also states he has had some mild constipation and was encouraged to take a dose of milk of magnesia and increase his fluids and fiber.  He was in agreement to continue Colace.  We discussed titration up on his Abilify for residual psychotic symptoms.  I advised that he appears brighter and more engaged and less isolative with the titration up on Abilify but his psychotic symptoms do not appear to be resolving.  He states he is not noticing any TD symptoms or involuntary movements and he has very minimal lateral jaw tremor with activation and distraction on exam today.  Diagnosis:  Principal Problem:   Schizoaffective disorder, bipolar type ([place]) Active Problems:   Homeless   Tardive dyskinesia  Past Psychiatric History: See H&P  Past Medical History:  Past Medical History:  Diagnosis Date   Schizophrenia (HCC)    Schizophrenia, paranoid type (HCC) 02/02/2021   Family History: see H&P  Family Psychiatric  History: See H&P  Social History:  Social History   Substance and Sexual Activity  Alcohol Use Not Currently   Alcohol/week: 1.0 standard drink   Types: 1 Cans of beer per week   Comment: occasional     Social History   Substance and Sexual Activity  Drug Use Never    Social History    Socioeconomic History   Marital status: Single    Spouse name: Not on file   Number of children: 1   Years of education: Not on file   Highest education level: Not on file  Occupational History   Occupation: Unemployed  Tobacco Use   Smoking status: Never   Smokeless tobacco: Never  Vaping Use   Vaping Use: Never used  Substance and Sexual Activity   Alcohol use: Not Currently    Alcohol/week: 1.0 standard drink    Types: 1 Cans of beer per week    Comment: occasional   Drug use: Never   Sexual activity: Never  Other Topics Concern   Not on file  Social History Narrative   ** Merged History Encounter **       Pt lives with mother and other relatives.  Currently unemployed.  Receives outpatient psychiatry services through [facility].   Social Determinants of Health   Financial Resource Strain: Not on file  Food Insecurity: Not on file  Transportation Needs: Not on file  Physical Activity: Not on file  Stress: Not on file  Social Connections: Not on file    Sleep: Poor  Appetite: Good  Current Medications: Current Facility-Administered Medications  Medication Dose Route Frequency Provider Last Rate Last Admin   acetaminophen (TYLENOL) tablet 650 mg  650 mg Oral Q6H PRN Laveda Abbe, NP   650 mg at 02/12/21 1826   alum & mag hydroxide-simeth (MAALOX/MYLANTA) 200-200-20 MG/5ML suspension 30 mL  30 mL Oral Q4H PRN Laveda Abbe, NP       ARIPiprazole (ABILIFY) tablet 20 mg  20 mg Oral Daily Mason Jim, Orlander Norwood E, MD   20 mg at 02/12/21 0820   benztropine (COGENTIN) tablet 1 mg  1 mg Oral BID Comer Locket, MD   1 mg at 02/12/21 1828   docusate sodium (COLACE) capsule 100 mg  100 mg Oral Daily PRN Comer Locket, MD       hydrOXYzine (ATARAX) tablet 25 mg  25 mg Oral TID PRN Laveda Abbe, NP   25 mg at 02/12/21 1636   OLANZapine zydis (ZYPREXA) disintegrating tablet 5 mg  5 mg Oral Q8H PRN [name], DO       And   LORazepam (ATIVAN) tablet  1 mg  1 mg Oral PRN [name], DO       And   ziprasidone (GEODON) injection 20 mg  20 mg Intramuscular PRN [name], DO       magnesium hydroxide (MILK OF MAGNESIA) suspension 30 mL  30 mL Oral Daily PRN Laveda Abbe, NP       melatonin tablet 3 mg  3 mg Oral QHS [name], DO   3 mg at 02/12/21 2144   traZODone (DESYREL) tablet 50 mg  50 mg Oral QHS PRN Comer Locket, MD   50 mg at 02/12/21 2144  valbenazine (INGREZZA) capsule 40 mg  40 mg Oral Daily Nelda Marseille, Aasiya Creasey E, MD   40 mg at 02/12/21 0820    Lab Results:  No results found for this or any previous visit (from the past 21 hour(s)).   Blood Alcohol level:  Lab Results  Component Value Date   ETH <10 02/02/2021   ETH <10 [id number]    Metabolic Disorder Labs: Lab Results  Component Value Date   HGBA1C 6.0 (H) 02/03/2021   MPG 126 02/03/2021   MPG 114 02/02/2021   Lab Results  Component Value Date   PROLACTIN 23.4 (H) 11/28/2018   Lab Results  Component Value Date   CHOL 131 01/30/2021   TRIG 104 01/30/2021   HDL 30 (L) 01/30/2021   CHOLHDL 4.4 01/30/2021   VLDL 21 01/30/2021   LDLCALC 80 01/30/2021   LDLCALC 69 08/25/2019    Physical Findings:  Musculoskeletal: Strength & Muscle Tone: within normal limits Gait & Station: normal Patient leans: N/A  Psychiatric Specialty Exam:  Presentation  General Appearance: casually dressed, fair hygiene  Eye Contact: Minimal  Speech: Clear and coherent, normal rate  Speech Volume:Decreased  Mood and Affect  Mood: aloof  Affect: constricted but at times laugh inappropriately on exam   Thought Process  Thought Processes: concrete, linear  Orientation:oriented to self, month, year and President  Thought Content: Endorsed AH making possessive comments - non-command in nature. Endorses paranoia and persecutory delusions about family. Appears to have some residual belief that family is influencing his thoughts. Denied VH, thought  broadcasting,or ideas of reference. Guarded on exam today   History of Schizophrenia/Schizoaffective disorder:Yes  Duration of Psychotic Symptoms:Greater than six months  Hallucinations:AH making possessive statements  Ideas of Reference:None   Suicidal Thoughts:None  Homicidal Thoughts:None   Sensorium  Memory:Immediate Fair; Recent Fair; Remote Fair  Judgment: Fair - compliant with meds  Insight:Lacking   Executive Functions  Concentration:Fair  Attention Span:Fair  [place]   Psychomotor Activity  Psychomotor Activity: Involuntary movement laterally of lower jaw with distraction-minimal, nearly gone; no puckering, lip smacking or involuntary tongue movements, no truncal or extremity involuntary movements noted; no cogwheeling bilateral UE; no tremors noted in extremities, no stiffness today  Assets  Assets:Social Support; Leisure Time  Sleep  3.25 hours  Physical Exam: Vitals:   02/12/21 1628 02/12/21 2000 02/13/21 0627 02/13/21 0628  BP: (!) 146/98 112/85 (!) 138/94 (!) 138/93  Pulse: (!) 120 69 77 86  Temp: 99.2 F (37.3 C) 98.6 F (37 C) 98.2 F (36.8 C)   Resp:  18 18   Height:      Weight:      SpO2: 96% 99% 98%   TempSrc: Oral Oral Oral   BMI (Calculated):         Physical Exam Vitals and nursing note reviewed.  Constitutional:      General: He is awake. He is not in acute distress.    Appearance: He is not ill-appearing or diaphoretic.  HENT:     Head: Normocephalic.  Pulmonary:     Effort: Pulmonary effort is normal.  Neurological:     General: No focal deficit present.     Mental Status: He is alert.  Psychiatric:        Behavior: Behavior is cooperative.    Review of Systems  Respiratory:  Negative for shortness of breath.   Cardiovascular:  Negative for chest pain.  Gastrointestinal:  Positive for constipation. Negative for nausea and  vomiting.  Musculoskeletal:        L shoulder  soreness  Neurological:  Negative for dizziness and headaches.    Treatment Plan & Assessment Summary: Principal Problem:   Schizoaffective disorder, bipolar type ([place]) Active Problems:   Homeless   Tardive dyskinesia  PLAN: Schizoaffective d/o bipolar type (r/o schizophrenia) S/p Haldol Decanoate 100 mg IM (01/29/2021) Increase Abilify to 30mg  daily with additional 10mg  today - if not improving with max dose of Abilify may need cross-taper onto different atypical antipsychotic QTc 459ms on 02/03/21 and 357ms on 02/09/21, A1c 6.0, Lipids WNL other than HDL 30 Holding stimulant and advised not to restart Adderall  Increase Trazodone to 100mg  qhs PRN insomnia and good sleep hygiene discussed - continue Melatonin 3mg  qhs   Tardive Dyskinesia and EPS Minimal lateral jaw movement, very subtle and emerges only during distraction, patient noticed when asked, but is unbothered.  Continued Cogentin 1 mg bid-  Continued Ingrezza 40 mg daily   Prediabetes A1c 6.0 Recommended follow-up with PCP after discharge Continued to encourage lifestyle modification Will consider metformin if patient is amenable, however unsure of med adherence after dc given hx and disease  Constipation Continue Colace schedule 100mg  qd rather than PRN Encouraged MOM and increased fluid and fiber  Hep B SAg (+) Consistent with Hep B vaccination immunity status. Hep B labs from 11/03/20 at [facility] showed Hep B SAg (+), however patient received HEP B #2 on 09/16/20 at [facility]. HepB labs were negative on 07/27/20. Cannot rule out past infection of HBV that he has recovered from. Given HepB Core Ab and HepB S Ag are NR (2/1), patient does not have active HepB infection. Hep B DNA PCR showed no HBV DNA detected. Acute hepatitis panel also negative on 02/09/21 for Hep C Ab and Hep A IgM  Dispo: Patient, No Pcp Per (Inactive)  Dispo: homeless shelter (Patient was living with father  prior to admission) Pending clinical improvement IVC'd by father Kyrion Morles [phone] Will have patient f/u with medication assistance program for Ingrezza  Patient's next Haldol Decanoate 100 mg IM injection is due on 02/24/2021  Safety, monitoring and disposition planning: The patient was seen and evaluated on the unit.  The patient's chart was reviewed and nursing notes were reviewed.  The patient's case was discussed in multidisciplinary team meeting.  Plan and drug side effects were discussed with patient who was amendable. Social work and case management to assist with discharge planning and identification of hospital follow-up needs prior to discharge. Discharge Concerns: Need to establish a safety plan; Medication compliance and effectiveness Discharge Goals: Return home with outpatient referrals for mental health follow-up including medication management/psychotherapy Safety and Monitoring: Involuntary status at inpatient psychiatric unit for safety, stabilization and treatment Daily contact with patient to assess and evaluate symptoms and progress in treatment and medical management Patient's case to be discussed in multi-disciplinary team meeting Observation Level: Per above Vital signs:  q12 hours Precautions: suicide   Signed: Viann Fish, MD, Alda Ponder

## 2021-02-13 NOTE — [facility] Group Notes (Signed)
Adult Psychoeducational Group Not Date:  02/13/2021 Time:  1540-0867 Group Topic/Focus: PROGRESSIVE RELAXATION. A group where deep breathing is taught and tensing and relaxation muscle groups is used. Imagery is used as well.  Pts are asked to imagine 3 pillars that hold them up when they are not able to hold themselves up.  Participation Level:  Active  Participation Quality:  Appropriate  Affect:  Appropriate  Cognitive:  Oriented  Insight: Improving  Engagement in Group:  Engaged  Modes of Intervention:  Activity, Discussion, Education, and Support  Additional Comments:  Pt rates his energy at a 7/10. States music, medication and his family are his pillars  Dione Housekeeper

## 2021-02-14 ENCOUNTER — Encounter (HOSPITAL_COMMUNITY): Payer: Self-pay

## 2021-02-14 DIAGNOSIS — F250 Schizoaffective disorder, bipolar type: Secondary | ICD-10-CM | POA: Diagnosis not present

## 2021-02-14 MED ORDER — BENZTROPINE MESYLATE 1 MG PO TABS
1.0000 mg | ORAL_TABLET | Freq: Three times a day (TID) | ORAL | Status: DC
  Administered 2021-02-14 – 2021-02-17 (×10): 1 mg via ORAL
  Filled 2021-02-14 (×13): qty 1

## 2021-02-14 MED ORDER — OLANZAPINE 5 MG PO TABS
5.0000 mg | ORAL_TABLET | Freq: Every day | ORAL | Status: DC
  Administered 2021-02-14: 5 mg via ORAL
  Filled 2021-02-14 (×3): qty 1

## 2021-02-14 MED ORDER — ARIPIPRAZOLE 10 MG PO TABS
20.0000 mg | ORAL_TABLET | Freq: Every day | ORAL | Status: DC
  Administered 2021-02-15: 20 mg via ORAL
  Filled 2021-02-14 (×3): qty 2

## 2021-02-14 NOTE — Progress Notes (Signed)
D: Patient is compliant with his medications. His cogentin was increased and he was asking why. Educated patient on benefits of cogentin. He denies any thoughts of self harm. He appears to have some thought blocking at times. He is observed attending group activities in the milieu. He is pleasant with staff and his peers.   A: Continue to monitor medication management and MD orders.  Safety checks completed every 15 minutes per protocol.  Offer support and encouragement as needed.  R: Patient is receptive to staff; his behavior is appropriate.     02/14/21 1600  Psych Admission Type (Psych Patients Only)  Admission Status Involuntary  Psychosocial Assessment  Patient Complaints Anxiety  Eye Contact Brief  Facial Expression Blank  Affect Flat  Speech Logical/coherent  Interaction Minimal  Motor Activity Slow  Appearance/Hygiene Unremarkable  Behavior Characteristics Cooperative  Mood Suspicious  Thought Process  Coherency Blocking  Content Paranoia  Delusions Paranoid  Perception Hallucinations  Hallucination Auditory  Judgment Limited  Confusion None  Danger to Self  Current suicidal ideation? Denies  Danger to Others  Danger to Others None reported or observed     02/14/21 1600  Psych Admission Type (Psych Patients Only)  Admission Status Involuntary  Psychosocial Assessment  Patient Complaints Anxiety  Eye Contact Brief  Facial Expression Blank  Affect Flat  Speech Logical/coherent  Interaction Minimal  Motor Activity Slow  Appearance/Hygiene Unremarkable  Behavior Characteristics Cooperative  Mood Suspicious  Thought Process  Coherency Blocking  Content Paranoia  Delusions Paranoid  Perception Hallucinations  Hallucination Auditory  Judgment Limited  Confusion None  Danger to Self  Current suicidal ideation? Denies  Danger to Others  Danger to Others None reported or observed

## 2021-02-14 NOTE — Progress Notes (Signed)
Adult Psychoeducational Group Note  Date:  02/14/2021 Time:  10:39 AM  Group Topic/Focus:  Goals Group:   The focus of this group is to help patients establish daily goals to achieve during treatment and discuss how the patient can incorporate goal setting into their daily lives to aide in recovery.  Participation Level:  Active  Participation Quality:  Appropriate  Affect:  Appropriate  Cognitive:  Appropriate  Insight: Appropriate  Engagement in Group:  Engaged  Modes of Intervention:  Discussion  Additional Comments:  Patient attended morning orientation/goal group and participated.   Wesley Peterson W Wesley Peterson 02/14/2021, 10:39 AM

## 2021-02-14 NOTE — BH IP Treatment Plan (Signed)
Interdisciplinary Treatment and Diagnostic Plan Update  02/14/2021 Time of Session: update Wesley Peterson MRN: [id number]  Principal Diagnosis: Schizoaffective disorder, bipolar type ([place])  Secondary Diagnoses: Principal Problem:   Schizoaffective disorder, bipolar type ([place]) Active Problems:   Homeless   Tardive dyskinesia   Current Medications:  Current Facility-Administered Medications  Medication Dose Route Frequency Provider Last Rate Last Admin   acetaminophen (TYLENOL) tablet 650 mg  650 mg Oral Q6H PRN Ethelene Hal, NP   650 mg at 02/13/21 0812   alum & mag hydroxide-simeth (MAALOX/MYLANTA) 200-200-20 MG/5ML suspension 30 mL  30 mL Oral Q4H PRN Ethelene Hal, NP       ARIPiprazole (ABILIFY) tablet 30 mg  30 mg Oral Daily Nelda Marseille, Amy E, MD   30 mg at 02/14/21 3825   benztropine (COGENTIN) tablet 1 mg  1 mg Oral BID Viann Fish E, MD   1 mg at 02/14/21 0539   docusate sodium (COLACE) capsule 100 mg  100 mg Oral Daily Viann Fish E, MD   100 mg at 02/14/21 7673   hydrOXYzine (ATARAX) tablet 25 mg  25 mg Oral TID PRN Ethelene Hal, NP   25 mg at 02/13/21 4193   ibuprofen (ADVIL) tablet 600 mg  600 mg Oral Q6H PRN Harlow Asa, MD   600 mg at 02/13/21 1515   OLANZapine zydis (ZYPREXA) disintegrating tablet 5 mg  5 mg Oral Q8H PRN Merrily Brittle, DO       And   LORazepam (ATIVAN) tablet 1 mg  1 mg Oral PRN Merrily Brittle, DO       And   ziprasidone (GEODON) injection 20 mg  20 mg Intramuscular PRN Merrily Brittle, DO       magnesium hydroxide (MILK OF MAGNESIA) suspension 30 mL  30 mL Oral Daily PRN Ethelene Hal, NP   30 mL at 02/13/21 1515   melatonin tablet 3 mg  3 mg Oral QHS Merrily Brittle, DO   3 mg at 02/13/21 2102   traZODone (DESYREL) tablet 100 mg  100 mg Oral QHS PRN Harlow Asa, MD       valbenazine [place]) capsule 40 mg  40 mg Oral Daily Nelda Marseille, Amy E, MD   40 mg at 02/14/21 0808   PTA Medications: Medications Prior to  Admission  Medication Sig Dispense Refill Last Dose   amphetamine-dextroamphetamine (ADDERALL) 5 MG tablet Take 10 mg by mouth every morning. (Patient not taking: Reported on 01/28/2021)      benztropine (COGENTIN) 1 MG tablet Take 1 tablet (1 mg total) by mouth 2 (two) times daily. (Patient not taking: Reported on 01/28/2021) 60 tablet 0    [START ON 02/24/2021] haloperidol decanoate (HALDOL DECANOATE) 100 MG/ML injection Inject 1 mL (100 mg total) into the muscle every 28 (twenty-eight) days. 1 mL 0     Patient Stressors: Financial difficulties    Patient Strengths: Ability for Health and safety inspector for treatment/growth   Treatment Modalities: Medication Management, Group therapy, Case management,  1 to 1 session with clinician, Psychoeducation, Recreational therapy.   Physician Treatment Plan for Primary Diagnosis: Schizoaffective disorder, bipolar type ([place]) Long Term Goal(s):     Short Term Goals:    Medication Management: Evaluate patient's response, side effects, and tolerance of medication regimen.  Therapeutic Interventions: 1 to 1 sessions, Unit Group sessions and Medication administration.  Evaluation of Outcomes: Progressing  Physician Treatment Plan for Secondary Diagnosis: Principal Problem:   Schizoaffective disorder, bipolar type ([place])  Active Problems:   Homeless   Tardive dyskinesia  Long Term Goal(s):     Short Term Goals:       Medication Management: Evaluate patient's response, side effects, and tolerance of medication regimen.  Therapeutic Interventions: 1 to 1 sessions, Unit Group sessions and Medication administration.  Evaluation of Outcomes: Progressing   RN Treatment Plan for Primary Diagnosis: Schizoaffective disorder, bipolar type ([place]) Long Term Goal(s): Knowledge of disease and therapeutic regimen to maintain health will improve  Short Term Goals: Ability to participate in decision making will improve, Ability to verbalize  feelings will improve, Ability to identify and develop effective coping behaviors will improve, and Compliance with prescribed medications will improve  Medication Management: RN will administer medications as ordered by provider, will assess and evaluate patient's response and provide education to patient for prescribed medication. RN will report any adverse and/or side effects to prescribing provider.  Therapeutic Interventions: 1 on 1 counseling sessions, Psychoeducation, Medication administration, Evaluate responses to treatment, Monitor vital signs and CBGs as ordered, Perform/monitor CIWA, COWS, AIMS and Fall Risk screenings as ordered, Perform wound care treatments as ordered.  Evaluation of Outcomes: Progressing   LCSW Treatment Plan for Primary Diagnosis: Schizoaffective disorder, bipolar type ([place]) Long Term Goal(s): Safe transition to appropriate next level of care at discharge, Engage patient in therapeutic group addressing interpersonal concerns.  Short Term Goals: Engage patient in aftercare planning with referrals and resources, Increase social support, Increase ability to appropriately verbalize feelings, Increase emotional regulation, Facilitate acceptance of mental health diagnosis and concerns, Facilitate patient progression through stages of change regarding substance use diagnoses and concerns, Identify triggers associated with mental health/substance abuse issues, and Increase skills for wellness and recovery  Therapeutic Interventions: Assess for all discharge needs, 1 to 1 time with Social worker, Explore available resources and support systems, Assess for adequacy in community support network, Educate family and significant other(s) on suicide prevention, Complete Psychosocial Assessment, Interpersonal group therapy.  Evaluation of Outcomes: Not Met   Progress in Treatment: Attending groups: Yes. Participating in groups: No. Taking medication as prescribed:  Yes. Toleration medication: Yes. Family/Significant other contact made: Yes, individual(s) contacted:  [facility], father Patient understands diagnosis: No. Discussing patient identified problems/goals with staff: No. Medical problems stabilized or resolved: Yes. Denies suicidal/homicidal ideation: Yes. Issues/concerns per patient self-inventory: No.   New problem(s) identified: No, Describe:  n/a  New Short Term/Long Term Goal(s): update  Patient Goals:    medication stabilization, elimination of SI thoughts, development of comprehensive mental wellness plan.    Discharge Plan or Barriers: Patient continues to have psychotic symptoms.  Patient can not engage in effective discharge planning at this time.  Continues to adjust medications to discharge medications.   Reason for Continuation of Hospitalization: Delusions  Hallucinations Medication stabilization  Estimated Length of Stay: 3-5 days   Scribe for Treatment Team: Zachery Conch, LCSW 02/14/2021 10:07 AM

## 2021-02-14 NOTE — Group Note (Signed)
Recreation Therapy Group Note   Group Topic:Stress Management  Group Date: 02/14/2021 Start Time: 0930 End Time: 0950 Facilitators: Caroll Rancher, LRT,CTRS Location: [address] Dayroom   Goal Area(s) Addresses:  Patient will actively participate in stress management techniques presented during session.  Patient will successfully identify benefit of practicing stress management post d/c.   Group Description: Guided Imagery. LRT provided education, instruction, and demonstration on practice of visualization via guided imagery. Patient was asked to participate in the technique introduced during session. LRT debriefed including topics of mindfulness, stress management and specific scenarios each patient could use these techniques. Patients were given suggestions of ways to access scripts post d/c and encouraged to explore Youtube and other apps available on smartphones, tablets, and computers.   Affect/Mood: Appropriate   Participation Level: Active   Participation Quality: Independent   Behavior: Appropriate   Speech/Thought Process: Focused   Insight: Good   Judgement: Good   Modes of Intervention: Script, Ambient Sounds   Patient Response to Interventions:  Attentive   Education Outcome:  Acknowledges education and In group clarification offered    Clinical Observations/Individualized Feedback: Pt attended and participated in group activity.    Plan: Continue to engage patient in RT group sessions 2-3x/week.   Caroll Rancher, LRT,CTRS 02/14/2021 11:13 AM

## 2021-02-14 NOTE — Progress Notes (Addendum)
[facility] MD Progress Note  02/14/2021 5:24 PM Bertin Inabinet  MRN: [id number]  CC: acute psychosis  Reason for Admission: Wesley Peterson is a 36 y.o. male with PPHx schizophrenia vs schizoaffective d/o who presented to [facility] ED for acute psychosis, agitation and concern for HI. He was then admitted involuntarily to [facility] for treatment of psychosis exacerbated by poor therapeutic compliance. [place] [facility] 12   Chart Review from last 24 hours:  The patient's chart was reviewed and nursing notes were reviewed. The patient's case was discussed in multidisciplinary team meeting. Per nursing, he endorses AH but had no acute behavioral issues or safety concerns noted. He attended groups. Per [facility] he was compliant with scheduled medications.  Information Obtained Today During Patient Interview: The patient was seen and evaluated on the unit with attending.  On assessment, patient was cooperative but was still guarded and exhibited tangential thought process and would stare off at times when talking about his delusions.He reported that his noncommand AH are unchanged, mainly occur when he is in the restroom or when he is alone.  Reported the last time he heard them was this AM when he was in bed alone.  He stated that they said "that's theirs, that his, that [place]" that are voices from his stepbrother, a "random girl I have never met before", and "other people I used to work with".  Patient is still paranoid that his family is responsible for influencing his actions.  However he denies belief that they are controlling his mind.  He reported concerns that are consistent with thought insertion, that they are putting something in his mind.  He denied thought broadcasting and ideas of reference.  When discussing the delusions, patient would stare off, concerning for being internally preoccupied.  Although he denied AH during the conversation, during groups, during mealtimes.He reported improved sleep, and that he did not  have any caffeine after lunch.  He reported that his energy level has improved today, and that his appetite and mood are stable.  Patient denied noticing any involuntary movements, specifically the lateral jaw movement discussed over the past few days.Patient denied SI/HI/VH, and contracted for safety on the unit.  Diagnosis:  Principal Problem:   Schizoaffective disorder, bipolar type ([place]) Active Problems:   Homeless   Tardive dyskinesia  Past Psychiatric History: See H&P  Past Medical History:  Past Medical History:  Diagnosis Date   Schizophrenia ([place])    Schizophrenia, paranoid type ([place]) 02/02/2021   Family History: see H&P  Family Psychiatric  History: See H&P  Social History:  Social History   Substance and Sexual Activity  Alcohol Use Not Currently   Alcohol/week: 1.0 standard drink   Types: 1 Cans of beer per week   Comment: occasional     Social History   Substance and Sexual Activity  Drug Use Never    Social History   Socioeconomic History   Marital status: Single    Spouse name: Not on file   Number of children: 1   Years of education: Not on file   Highest education level: Not on file  Occupational History   Occupation: Unemployed  Tobacco Use   Smoking status: Never   Smokeless tobacco: Never  Vaping Use   Vaping Use: Never used  Substance and Sexual Activity   Alcohol use: Not Currently    Alcohol/week: 1.0 standard drink    Types: 1 Cans of beer per week    Comment: occasional   Drug use:  Never   Sexual activity: Never  Other Topics Concern   Not on file  Social History Narrative   ** Merged History Encounter **       Pt lives with mother and other relatives.  Currently unemployed.  [place] outpatient psychiatry services through [facility].   Social Determinants of Health   Financial Resource Strain: Not on file  Food Insecurity: Not on file  Transportation Needs: Not on file  Physical Activity: Not on file  Stress: Not on file   Social Connections: Not on file    Sleep: Improved  Appetite: Fair, improved  Current Medications: Current Facility-Administered Medications  Medication Dose Route Frequency Provider Last Rate Last Admin   acetaminophen (TYLENOL) tablet 650 mg  650 mg Oral Q6H PRN Ethelene Hal, NP   650 mg at 02/13/21 0812   alum & mag hydroxide-simeth (MAALOX/MYLANTA) 200-200-20 MG/5ML suspension 30 mL  30 mL Oral Q4H PRN Ethelene Hal, NP       Derrill Memo ON 02/15/2021] ARIPiprazole (ABILIFY) tablet 20 mg  20 mg Oral Daily Merrily Brittle, DO       benztropine (COGENTIN) tablet 1 mg  1 mg Oral TID Merrily Brittle, DO   1 mg at 02/14/21 1637   docusate sodium (COLACE) capsule 100 mg  100 mg Oral Daily Harlow Asa, MD   100 mg at 02/14/21 9741   hydrOXYzine (ATARAX) tablet 25 mg  25 mg Oral TID PRN Ethelene Hal, NP   25 mg at 02/13/21 6384   ibuprofen (ADVIL) tablet 600 mg  600 mg Oral Q6H PRN Harlow Asa, MD   600 mg at 02/14/21 1336   OLANZapine zydis (ZYPREXA) disintegrating tablet 5 mg  5 mg Oral Q8H PRN Merrily Brittle, DO       And   LORazepam (ATIVAN) tablet 1 mg  1 mg Oral PRN Merrily Brittle, DO       And   ziprasidone (GEODON) injection 20 mg  20 mg Intramuscular PRN Merrily Brittle, DO       magnesium hydroxide (MILK OF MAGNESIA) suspension 30 mL  30 mL Oral Daily PRN Ethelene Hal, NP   30 mL at 02/13/21 1515   melatonin tablet 3 mg  3 mg Oral QHS Merrily Brittle, DO   3 mg at 02/13/21 2102   OLANZapine (ZYPREXA) tablet 5 mg  5 mg Oral QHS Merrily Brittle, DO       traZODone (DESYREL) tablet 100 mg  100 mg Oral QHS PRN Harlow Asa, MD       valbenazine [facility]) capsule 40 mg  40 mg Oral Daily Harlow Asa, MD   40 mg at 02/14/21 5364    Lab Results:  No results found for this or any previous visit (from the past 19 hour(s)).   Blood Alcohol level:  Lab Results  Component Value Date   ETH <10 02/02/2021   ETH <10 68/03/2120    Metabolic Disorder  Labs: Lab Results  Component Value Date   HGBA1C 6.0 (H) 02/03/2021   MPG 126 02/03/2021   MPG 114 02/02/2021   Lab Results  Component Value Date   PROLACTIN 23.4 (H) 11/28/2018   Lab Results  Component Value Date   CHOL 131 01/30/2021   TRIG 104 01/30/2021   HDL 30 (L) 01/30/2021   CHOLHDL 4.4 01/30/2021   VLDL 21 01/30/2021   LDLCALC 80 01/30/2021   LDLCALC 69 08/25/2019    Physical Findings:  Musculoskeletal: Strength & Muscle  Tone: within normal limits Gait & Station: normal Patient leans: N/A  Psychiatric Specialty Exam:  Presentation  General Appearance: casually dressed, fair hygiene  Eye Contact: Minimal, would stare off in the distance at times  Speech: Clear and coherent, normal rate  Speech Volume:Decreased  Mood and Affect  Mood: aloof, guarded  Affect: constricted but at times laughs inappropriately on exam, paranoid   Thought Process  Thought Processes: concrete, evasive with difficulty expressing thoughts today  Orientation:oriented to self, month, year and President  Thought Content: Endorsed AH making possessive comments - non-command in nature. Endorses paranoia and persecutory delusions about family. Appears to have some residual belief that family is influencing his thoughts or inserting thoughts. Denied VH, thought broadcasting,or ideas of reference. Guarded on exam today and questionably internally preoccupied at times on exam  History of Schizophrenia/Schizoaffective disorder:Yes  Duration of Psychotic Symptoms:Greater than six months  Hallucinations:AH making possessive statements  Ideas of Reference:None   Suicidal Thoughts:None  Homicidal Thoughts:None   Sensorium  Memory:Immediate Fair; Recent Fair; Remote Fair  Judgment: Fair - compliant with meds  Insight:Lacking   Executive Functions  Concentration:Fair  Attention Span:Fair  [place]   Psychomotor Activity   Psychomotor Activity: Involuntary movement laterally of lower jaw with distraction-minimal, nearly gone; no puckering, lip smacking or involuntary tongue movements, no truncal or extremity involuntary movements noted; no cogwheeling bilateral UE; no tremors noted in extremities, no stiffness today.  Bilateral wrist and elbow cogwheeling AIMS 2  Assets  Assets:Social Support; Leisure Time  Sleep  6.5 hours  Physical Exam: Vitals:   02/12/21 2000 02/13/21 0627 02/13/21 0628 02/14/21 1626  BP: 112/85 (!) 138/94 (!) 138/93 125/79  Pulse: 69 77 86 (!) 118  Temp: 98.6 F (37 C) 98.2 F (36.8 C)  97.6 F (36.4 C)  Resp: 18 18    Height:      Weight:      SpO2: 99% 98%  98%  TempSrc: Oral Oral    BMI (Calculated):         Physical Exam Vitals and nursing note reviewed.  Constitutional:      General: He is awake. He is not in acute distress.    Appearance: He is not ill-appearing or diaphoretic.  HENT:     Head: Normocephalic.  Pulmonary:     Effort: Pulmonary effort is normal.  Neurological:     General: No focal deficit present.     Mental Status: He is alert.  Psychiatric:        Behavior: Behavior is cooperative.    Review of Systems  Respiratory:  Negative for shortness of breath.   Cardiovascular:  Negative for chest pain.  Gastrointestinal:  Negative for nausea and vomiting.  Musculoskeletal:        L shoulder soreness  Neurological:  Negative for dizziness and headaches.    Treatment Plan & Assessment Summary: Principal Problem:   Schizoaffective disorder, bipolar type ([place]) Active Problems:   Homeless   Tardive dyskinesia  PLAN: Schizoaffective d/o bipolar type (r/o schizophrenia) Patient is more interactive with the milieu and has a brighter affect, however he continued to have residual psychotic symptoms of noncommand AH, paranoid and persecutory delusions, thought insertion, flat affect, inappropriate laughing, and at times would stare off in a  distance, concerning for being internally preoccupied.  Patient had been on Abilify for a few days now and will start to cross titrate to a different atypical antipsychotic.  S/p Haldol Decanoate 100  mg IM (01/29/2021) Decreased Abilify 42m to 20 mg daily due to ineffectiveness in treating his residual psychosis. Goal to cross titrate to zyprexa  Started zyprexa 5 mg qHS for residual psychosis (discussed with patient) QTc 4375mon 02/03/21 and 39252mn 02/09/21, A1c 6.0, Lipids WNL other than HDL 30 Holding stimulant and advised not to restart Adderall  Continued Trazodone 100 mg qHS PRN insomnia and good sleep hygiene discussed - continue Melatonin 3mg3ms Considering further psychotic workup including head imaging if it has not been done - attempted to get in contact with [facility] where the patient f/u outpatient. Will try again.   Tardive Dyskinesia and EPS Minimal lateral jaw movement, very subtle and emerges only during distraction, patient noticed when asked, but is unbothered.  Cogwheeling noted on exam, suspected 2/2 maxing out Abilify. Continued to monitor with AIMS daily Increased Cogentin 1 mg bid to 3 times daily, for bilateral cogwheeling noted on exam - monitoring for constipation with dose increase Continued Ingrezza 40 mg daily   Prediabetes A1c 6.0 Recommended follow-up with PCP after discharge Continued to encourage lifestyle modification Will consider metformin if patient is amenable, however unsure of med adherence after dc given hx and disease  Constipation Continued Colace schedule 100mg27m Encouraged MOM and increased fluid and fiber  Hep B SAg (+) Consistent with Hep B vaccination immunity status. [facility] (+), however [facility]. Cannot rule out past infection of HBV that he has recovered from. Given HepB  Core Ab and HepB S Ag are NR (2/1), patient does not have active HepB infection. Hep B DNA PCR showed no HBV DNA detected. Acute hepatitis panel also negative on 02/09/21 for Hep C Ab and Hep A IgM  Dispo: Patient, No Pcp Per (Inactive)  Dispo: homeless shelter (Patient was living with father prior to admission) Pending clinical improvement IVC'd by father VinceMatyas [phone] have patient f/u with medication assistance program for Ingrezza  Patient's next Haldol Decanoate 100 mg IM injection is due on 02/24/2021  Safety, monitoring and disposition planning: The patient was seen and evaluated on the unit.  The patient's chart was reviewed and nursing notes were reviewed.  The patient's case was discussed in multidisciplinary team meeting.  Plan and drug side effects were discussed with patient who was amendable. Social work and case management to assist with discharge planning and identification of hospital follow-up needs prior to discharge. Discharge Concerns: Need to establish a safety plan; Medication compliance and effectiveness Discharge Goals: Return home with outpatient referrals for mental health follow-up including medication management/psychotherapy Safety and Monitoring: Involuntary status at inpatient psychiatric unit for safety, stabilization and treatment Daily contact with patient to assess and evaluate symptoms and progress in treatment and medical management Patient's case to be discussed in multi-disciplinary team meeting Observation Level: Per above Vital signs:  q12 hours Precautions: suicide   Signed: JulieMerrily BrittlePsychiatry Resident, PGY-1 [facility]2023, 5:47 PM

## 2021-02-14 NOTE — Group Note (Signed)
LCSW Group Therapy Note  Group Date: 02/14/2021 Start Time: 1300 End Time: 1400   Type of Therapy and Topic:  Group Therapy - Healthy vs Unhealthy Coping Skills  Participation Level:  Active   Description of Group The focus of this group was to determine what unhealthy coping techniques typically are used by group members and what healthy coping techniques would be helpful in coping with various problems. Patients were guided in becoming aware of the differences between healthy and unhealthy coping techniques. Patients were asked to identify 2-3 healthy coping skills they would like to learn to use more effectively.  Therapeutic Goals Patients learned that coping is what human beings do all day long to deal with various situations in their lives Patients defined and discussed healthy vs unhealthy coping techniques Patients identified their preferred coping techniques and identified whether these were healthy or unhealthy Patients determined 2-3 healthy coping skills they would like to become more familiar with and use more often. Patients provided support and ideas to each other   Summary of Patient Progress:  During group, pt expressed that music is a coping skill he used about prior to being in the hospital. Patient proved open to input from peers and feedback from CSW. Patient demonstrated insight into the subject matter, was respectful of peers, and participated throughout the entire session.   Therapeutic Modalities Cognitive Behavioral Therapy Motivational Interviewing  Wesley Peterson, Theresia Majors 02/14/2021  1:25 PM

## 2021-02-15 MED ORDER — OLANZAPINE 10 MG PO TABS
10.0000 mg | ORAL_TABLET | Freq: Every day | ORAL | Status: DC
  Administered 2021-02-15: 10 mg via ORAL
  Filled 2021-02-15 (×3): qty 1

## 2021-02-15 MED ORDER — ARIPIPRAZOLE 10 MG PO TABS
10.0000 mg | ORAL_TABLET | Freq: Every day | ORAL | Status: DC
  Administered 2021-02-16: 10 mg via ORAL
  Filled 2021-02-15 (×3): qty 1

## 2021-02-15 MED ORDER — VALBENAZINE TOSYLATE 40 MG PO CAPS
40.0000 mg | ORAL_CAPSULE | Freq: Every evening | ORAL | Status: DC
  Administered 2021-02-16 – 2021-02-20 (×5): 40 mg via ORAL
  Filled 2021-02-15 (×2): qty 1
  Filled 2021-02-15: qty 7
  Filled 2021-02-15 (×2): qty 1
  Filled 2021-02-15: qty 7
  Filled 2021-02-15 (×2): qty 1

## 2021-02-15 MED ORDER — FLUOXETINE HCL 10 MG PO CAPS
10.0000 mg | ORAL_CAPSULE | Freq: Every day | ORAL | Status: DC
  Administered 2021-02-15 – 2021-02-17 (×3): 10 mg via ORAL
  Filled 2021-02-15 (×5): qty 1

## 2021-02-15 NOTE — Plan of Care (Signed)
°  Problem: Activity: Goal: Interest or engagement in activities will improve Outcome: Progressing   Problem: Coping: Goal: Ability to verbalize frustrations and anger appropriately will improve Outcome: Progressing   Problem: Coping: Goal: Ability to demonstrate self-control will improve Outcome: Progressing   Problem: Safety: Goal: Periods of time without injury will increase Outcome: Progressing   Problem: Coping: Goal: Coping ability will improve Outcome: Progressing   Problem: Self-Concept: Goal: Will verbalize positive feelings about self Outcome: Progressing

## 2021-02-15 NOTE — Group Note (Signed)
Recreation Therapy Group Note   Group Topic:Animal Assisted Therapy   Group Date: 02/15/2021 Start Time: 1430 End Time: 1510 Facilitators: Caroll Rancher, LRT,CTRS Location: [address] Dayroom   Animal-Assisted Activity (AAA) Program Checklist/Progress Note Patient Eligibility Criteria Checklist & Daily Group note for Rec Tx Intervention  AAA/T Program Assumption of Risk Form signed by Patient/ or Parent Legal Guardian YES  Patient washes hands after animal contact YES  Group Description: Patients provided opportunity to interact with trained and credentialed [employer] Therapy dog and the community volunteer/dog handler. Patients practiced appropriate animal interaction and were educated on dog safety outside of the hospital in common community settings. Patients were allowed to use dog toys and other items to practice commands, engage the dog in play, and/or complete routine aspects of animal care.   Education: Charity fundraiser, Health visitor, Communication & Social Skills    Affect/Mood: N/A   Participation Level: Did not attend    Clinical Observations/Individualized Feedback:  Pt attended, participated and shared stories of their animals at home.  Pt also engaged with dog team by asking questions to gain information about dog training, service dogs and therapy dogs.    Plan: Continue to engage patient in RT group sessions 2-3x/week.   Caroll Rancher, LRT,CTRS 02/15/2021 3:52 PM

## 2021-02-15 NOTE — BHH Group Notes (Signed)
Pt attended and contributed to goals group. °

## 2021-02-15 NOTE — Progress Notes (Signed)
°   02/15/21 0827  Psych Admission Type (Psych Patients Only)  Admission Status Involuntary  Psychosocial Assessment  Patient Complaints None  Eye Contact Brief  Facial Expression Flat  Affect Flat  Speech Logical/coherent  Interaction Minimal  Motor Activity Other (Comment) (WNL)  Appearance/Hygiene Unremarkable  Behavior Characteristics Cooperative  Mood Euthymic  Thought Process  Coherency WDL  Content WDL  Delusions Paranoid  Perception Hallucinations  Hallucination Auditory  Judgment Impaired  Confusion None  Danger to Self  Current suicidal ideation? Denies  Danger to Others  Danger to Others None reported or observed

## 2021-02-15 NOTE — Progress Notes (Signed)
°   02/14/21 2309  Psych Admission Type (Psych Patients Only)  Admission Status Involuntary  Psychosocial Assessment  Patient Complaints None  Eye Contact Brief  Facial Expression Blank  Affect Flat  Speech Logical/coherent  Interaction Minimal  Motor Activity Slow  Appearance/Hygiene Unremarkable  Behavior Characteristics Cooperative;Appropriate to situation  Mood Pleasant  Thought Process  Coherency Blocking  Content Paranoia  Delusions Paranoid  Perception Hallucinations  Hallucination Auditory  Judgment Limited  Confusion None  Danger to Self  Current suicidal ideation? Denies  Danger to Others  Danger to Others None reported or observed

## 2021-02-15 NOTE — Progress Notes (Addendum)
Patient ID: Wesley Peterson, male   DOB: Oct 22, 1985, 36 y.o.   MRN: [id number]   Pt c/o R shoulder pain at 10/10 and requested medication. 600 mg ibuprofen, PRN given.

## 2021-02-15 NOTE — Progress Notes (Signed)
[facility] MD Progress Note  02/15/2021 3:13 PM Wesley Peterson  MRN: [id number]  CC: acute psychosis  Reason for Admission: Wesley Peterson is a 36 y.o. male with PPHx schizophrenia vs schizoaffective d/o who presented to [facility] ED for acute psychosis, agitation and concern for HI. He was then admitted involuntarily to [facility] for treatment of psychosis exacerbated by poor therapeutic compliance. [place] 13    Information Obtained Today During Patient Interview: The patient was seen and evaluated on the unit. Patient appeared to have a brighter affect and more talkative but still exhibit psychotic symptoms.  Patient reported that his AH has improved since starting zyprexa last night. Reported AH x1 that said "that's ours" in his step brother's voice, today when he went to make a phone call to his biological father. He still endorses paranoid and persecutory delusions about his family, stating that they must have done something to him. Patient continued to exhibit poor insight, stating that it is not the schizophrenia or adderall that may have caused his symptoms, but rather his family.  Patient reported feeling sad because he learned that his friend of ~20 years died recently. However he reported feeling "down" with hypersomnia, anhedonia, decreased appetite, decreased concentration, and psychomotor slowing for multiple weeks at a time. For this reason, patient perseverated on getting adderall because, "it makes my mood better". He denied SI/HI/VH and contracted to safety on the unit. He reported improved sleep, appetite, and energy. Patient denied noticing symptoms on tardive dyskinesia and that his muscles are less stiff. Discussed continuing with cross tapering abilify to zyprexa and starting an antidepressant, patient was amenable with the plan.   Diagnosis:  Principal Problem:   Schizoaffective disorder, bipolar type ([place]) Active Problems:   Homeless   Tardive dyskinesia  Past Psychiatric  History: See H&P  Past Medical History:  Past Medical History:  Diagnosis Date   Schizophrenia ([place])    Schizophrenia, paranoid type ([place]) 02/02/2021   Family History: see H&P  Family Psychiatric  History: See H&P  Social History:  Social History   Substance and Sexual Activity  Alcohol Use Not Currently   Alcohol/week: 1.0 standard drink   Types: 1 Cans of beer per week   Comment: occasional     Social History   Substance and Sexual Activity  Drug Use Never    Social History   Socioeconomic History   Marital status: Single    Spouse name: Not on file   Number of children: 1   Years of education: Not on file   Highest education level: Not on file  Occupational History   Occupation: Unemployed  Tobacco Use   Smoking status: Never   Smokeless tobacco: Never  Vaping Use   Vaping Use: Never used  Substance and Sexual Activity   Alcohol use: Not Currently    Alcohol/week: 1.0 standard drink    Types: 1 Cans of beer per week    Comment: occasional   Drug use: Never   Sexual activity: Never  Other Topics Concern   Not on file  Social History Narrative   ** Merged History Encounter **       Pt lives with mother and other relatives.  Currently unemployed.  [place] outpatient psychiatry services through [facility].   Social Determinants of Health   Financial Resource Strain: Not on file  Food Insecurity: Not on file  Transportation Needs: Not on file  Physical Activity: Not on file  Stress: Not on file  Social Connections:  Not on file    Sleep: Improved  Appetite: Fair, improved  Current Medications: Current Facility-Administered Medications  Medication Dose Route Frequency Provider Last Rate Last Admin   acetaminophen (TYLENOL) tablet 650 mg  650 mg Oral Q6H PRN Ethelene Hal, NP   650 mg at 02/13/21 0812   alum & mag hydroxide-simeth (MAALOX/MYLANTA) 200-200-20 MG/5ML suspension 30 mL  30 mL Oral Q4H PRN Ethelene Hal, NP       Derrill Memo ON  02/16/2021] ARIPiprazole (ABILIFY) tablet 10 mg  10 mg Oral Daily Merrily Brittle, DO       benztropine (COGENTIN) tablet 1 mg  1 mg Oral TID Merrily Brittle, DO   1 mg at 02/15/21 1218   docusate sodium (COLACE) capsule 100 mg  100 mg Oral Daily Nelda Marseille, Amy E, MD   100 mg at 02/14/21 [id number]   FLUoxetine (PROZAC) capsule 10 mg  10 mg Oral Daily Merrily Brittle, DO   10 mg at 02/15/21 1457   hydrOXYzine (ATARAX) tablet 25 mg  25 mg Oral TID PRN Ethelene Hal, NP   25 mg at 02/13/21 [id number]   ibuprofen (ADVIL) tablet 600 mg  600 mg Oral Q6H PRN Harlow Asa, MD   600 mg at 02/15/21 0827   OLANZapine zydis (ZYPREXA) disintegrating tablet 5 mg  5 mg Oral Q8H PRN Merrily Brittle, DO       And   LORazepam (ATIVAN) tablet 1 mg  1 mg Oral PRN Merrily Brittle, DO       And   ziprasidone (GEODON) injection 20 mg  20 mg Intramuscular PRN Merrily Brittle, DO       magnesium hydroxide (MILK OF MAGNESIA) suspension 30 mL  30 mL Oral Daily PRN Ethelene Hal, NP   30 mL at 02/13/21 1515   melatonin tablet 3 mg  3 mg Oral QHS Merrily Brittle, DO   3 mg at 02/14/21 2124   OLANZapine (ZYPREXA) tablet 10 mg  10 mg Oral QHS Merrily Brittle, DO       traZODone (DESYREL) tablet 100 mg  100 mg Oral QHS PRN Harlow Asa, MD       [START ON 02/16/2021] valbenazine (INGREZZA) capsule 40 mg  40 mg Oral QPM Merrily Brittle, DO        Lab Results:  No results found for this or any previous visit (from the past 48 hour(s)).   Blood Alcohol level:  Lab Results  Component Value Date   ETH <10 02/02/2021   ETH <10 [id number]    Metabolic Disorder Labs: Lab Results  Component Value Date   HGBA1C 6.0 (H) 02/03/2021   MPG 126 02/03/2021   MPG 114 02/02/2021   Lab Results  Component Value Date   PROLACTIN 23.4 (H) 11/28/2018   Lab Results  Component Value Date   CHOL 131 01/30/2021   TRIG 104 01/30/2021   HDL 30 (L) 01/30/2021   CHOLHDL 4.4 01/30/2021   VLDL 21 01/30/2021   LDLCALC 80 01/30/2021   LDLCALC  69 08/25/2019    Physical Findings:  Musculoskeletal: Strength & Muscle Tone: within normal limits Gait & Station: normal Patient leans: N/A  Psychiatric Specialty Exam:  Presentation  General Appearance: casually dressed, fair hygiene  Eye Contact: Fair, would still stare off when talking about psychotic sxs  Speech: Clear and coherent, normal rate, more talkative  Speech Volume:Decreased  Mood and Affect  Mood: Depressed  Affect: Depressed, restricted, congruent, no inappropriate smiling today   Thought  Process  Thought Processes: Concrete, evasive with difficulty expressing thoughts concerning paranoia about family  Orientation: Oriented to self, month, year and President  Thought Content: Endorsed AH making possessive comments - non-command in nature. Endorses paranoia and persecutory delusions about family. Denied VH, thought broadcasting, thought insertion or ideas of reference.   History of Schizophrenia/Schizoaffective disorder:Yes  Duration of Psychotic Symptoms:Greater than six months  Hallucinations: AH making possessive statements  Ideas of Reference:None   Suicidal Thoughts:None  Homicidal Thoughts:None   Sensorium  Memory:Immediate Fair; Recent Fair; Remote Fair  Judgment: Fair - compliant with meds  Insight:Lacking   Executive Functions  Concentration:Fair  Attention Span:Fair  [place]   Psychomotor Activity  Psychomotor Activity: Involuntary movement laterally of lower jaw with distraction-minimal, nearly gone; no puckering, lip smacking or involuntary tongue movements, no truncal or extremity involuntary movements noted; no cogwheeling bilateral UE; no tremors noted in extremities, no stiffness today.  Bilateral wrist and elbow cogwheeling AIMS 2  Assets  Assets:Social Support; Leisure Time  Sleep  7.5 hours  Physical Exam: Vitals:   02/13/21 0628 02/14/21 1626 02/15/21 0626  02/15/21 0627  BP: (!) 138/93 125/79 131/88 (!) 130/98  Pulse: 86 (!) 118 68 79  Temp:  97.6 F (36.4 C) 98.7 F (37.1 C)   Resp:      Height:      Weight:      SpO2:  98% 100% 100%  TempSrc:   Oral   BMI (Calculated):         Physical Exam Vitals and nursing note reviewed.  Constitutional:      General: He is awake. He is not in acute distress.    Appearance: He is not ill-appearing or diaphoretic.  HENT:     Head: Normocephalic.  Pulmonary:     Effort: Pulmonary effort is normal.  Neurological:     General: No focal deficit present.     Mental Status: He is alert.  Psychiatric:        Behavior: Behavior is cooperative.    Review of Systems  Respiratory:  Negative for shortness of breath.   Cardiovascular:  Negative for chest pain.  Gastrointestinal:  Negative for nausea and vomiting.  Neurological:  Negative for dizziness and headaches.    Treatment Plan & Assessment Summary: Principal Problem:   Schizoaffective disorder, bipolar type ([place]) Active Problems:   Homeless   Tardive dyskinesia  PLAN: Schizoaffective d/o bipolar type (r/o schizophrenia) Patient's AH has improved with zyprexa, will continue cross tapering abilify to zyprexa. He still has residual paranoia delusion towards family. Given improvement in his psychotic symptoms, patient is able communicated his symptoms of depression, with main concerns being anhedonia, hypersomnia, decreased concentration, psychomotor slowing. These depressive symptoms are why he reported continued to use adderall at home.  S/p Haldol Decanoate 100 mg IM (01/29/2021) Cross titrate abilify to zyprexa given inefficiency in treating residual psychosis.  QTc 474ms on 02/03/21 and 352ms on 02/09/21, A1c 6.0, Lipids WNL other than HDL 30 Holding stimulant and advised not to restart Adderall  Continued  Melatonin 3mg  qhs    Tardive Dyskinesia and EPS Minimal lateral jaw movement, very subtle and emerges only during distraction,  patient noticed when asked, but is unbothered.  Cogwheeling noted on exam, suspected 2/2 Abilify. Continued to monitor with AIMS daily Continued Cogentin 1 TID, for bilateral cogwheeling noted on exam with plan to decrease since tapering off abilify Continued Ingrezza 40 mg qHS   Prediabetes A1c 6.0  Recommended follow-up with PCP after discharge Continued to encourage lifestyle modification Will consider metformin if patient is amenable, however unsure of med adherence after dc given hx and disease  Constipation Continued Colace schedule 100mg  qd  Encouraged MOM and increased fluid and fiber  Hep B SAg (+) Consistent with Hep B vaccination immunity status. Hep B labs from 11/03/20 at [facility] showed Hep B SAg (+), however patient received HEP B #2 on [facility]16/[facility]. HepB labs were negative on 07/27/20. Cannot rule out past infection of HBV that he has recovered from. Given HepB Core Ab and HepB S Ag are NR (2/1), patient does not have active HepB infection. Hep B DNA PCR showed no HBV DNA detected. Acute hepatitis panel also negative on 02/09/21 for Hep C Ab and Hep A IgM  Dispo: Patient, No Pcp Per (Inactive)  Dispo: homeless shelter (Patient was living with father prior to admission) Pending clinical improvement IVC'd by father Kelton Kozub [phone] Will have patient f/u with medication assistance program for Ingrezza  Patient's next Haldol Decanoate 100 mg IM injection is due on 02/24/2021  Safety, monitoring and disposition planning: The patient was seen and evaluated on the unit.  The patient's chart was reviewed and nursing notes were reviewed.  The patient's case was discussed in multidisciplinary team meeting.  Plan and drug side effects were discussed with patient who was amendable. Social work and case management to assist with discharge planning and identification of hospital follow-up needs prior to discharge. Discharge  Concerns: Need to establish a safety plan; Medication compliance and effectiveness Discharge Goals: Return home with outpatient referrals for mental health follow-up including medication management/psychotherapy Safety and Monitoring: Involuntary status at inpatient psychiatric unit for safety, stabilization and treatment Daily contact with patient to assess and evaluate symptoms and progress in treatment and medical management Patient's case to be discussed in multi-disciplinary team meeting Observation Level: Per above Vital signs:  q12 hours Precautions: suicide   Signed: Merrily Brittle, DO Psychiatry Resident, PGY-1 [facility] 02/15/2021, 3:13 PM

## 2021-02-16 MED ORDER — ARIPIPRAZOLE 5 MG PO TABS
5.0000 mg | ORAL_TABLET | Freq: Every day | ORAL | Status: DC
  Administered 2021-02-17: 5 mg via ORAL
  Filled 2021-02-16 (×2): qty 1

## 2021-02-16 MED ORDER — OLANZAPINE 7.5 MG PO TABS
15.0000 mg | ORAL_TABLET | Freq: Every day | ORAL | Status: DC
  Administered 2021-02-16 – 2021-02-20 (×5): 15 mg via ORAL
  Filled 2021-02-16 (×3): qty 2
  Filled 2021-02-16: qty 14
  Filled 2021-02-16 (×2): qty 2
  Filled 2021-02-16: qty 14
  Filled 2021-02-16 (×2): qty 2

## 2021-02-16 NOTE — Progress Notes (Addendum)
D: Patient states his sleep and appetite are "fair." He describes his energy level is low; his concentration is "low." He rates his depression, hopelessness and anxiety as a 9.  He appears to be thought blocking; has a difficult time getting his sentences out. His goal today is to "make calls." He denies any thoughts of self harm or AVH.  A: Continue to monitor medication management and MD orders.  Safety checks completed every 15 minutes per protocol.  Offer support and encouragement as needed.  R: Patient is receptive to staff; he is encouraged to attend group activities.   02/16/21 0900  Psych Admission Type (Psych Patients Only)  Admission Status Involuntary  Psychosocial Assessment  Patient Complaints None  Eye Contact Brief  Facial Expression Flat  Affect Flat  Speech Soft  Interaction Minimal  Motor Activity Slow  Appearance/Hygiene Unremarkable  Behavior Characteristics Cooperative  Mood Pleasant  Thought Process  Coherency WDL  Content WDL  Delusions Paranoid  Perception Hallucinations  Hallucination Auditory  Judgment Impaired  Confusion None  Danger to Self  Current suicidal ideation? Denies  Danger to Others  Danger to Others None reported or observed

## 2021-02-16 NOTE — Group Note (Signed)
Date:  02/16/2021 Time:  12:05 PM  Group Topic/Focus:  Healthy Communication:   The focus of this group is to discuss communication, barriers to communication, as well as healthy ways to communicate with others.    Participation Level:  None  Participation Quality:  Inattentive  Affect:  Flat  Cognitive:  Lacking  Insight: Lacking  Engagement in Group:  Lacking  Modes of Intervention:  Education  Additional Comments:  Patient was in group but did not participate in the discussion.   Reymundo Poll 02/16/2021, 12:05 PM

## 2021-02-16 NOTE — BHH Counselor (Signed)
CSW provided this patient with homeless resources and SLA information.     Ruthann Cancer MSW, LCSW Clincal Social Worker  [facility]

## 2021-02-16 NOTE — Group Note (Signed)
Recreation Therapy Group Note   Group Topic:Stress Management  Group Date: 02/16/2021 Start Time: 0932 End Time: 0958 Facilitators: Caroll Rancher, [place] Location: [address] Day[address]   Goal Area(s) Addresses:  Patient will identify positive stress management techniques. Patient will identify benefits of using stress management post d/c.  Group Description:  Stress Release Meditation.  LRT played a meditation that focused on breathing and releasing tension through tensing and releasing the muscles.  Patients were to focus on the meditation to fully engage.    Affect/Mood: Appropriate   Participation Level: Active   Participation Quality: Independent   Behavior: Appropriate   Speech/Thought Process: Focused   Insight: Good   Judgement: Good   Modes of Intervention: Meditation   Patient Response to Interventions:  Attentive   Education Outcome:  Acknowledges education and In group clarification offered    Clinical Observations/Individualized Feedback: Pt attended and participated in activity.    Plan: Continue to engage patient in RT group sessions 2-3x/week.   Caroll Rancher, LRT,CTRS 02/16/2021 12:30 PM

## 2021-02-16 NOTE — Group Note (Signed)
[facility] LCSW Group Therapy Note   Group Date: 02/16/2021 Start Time: 1300 End Time: 1400   Type of Therapy/Topic:  Group Therapy:  Emotion Regulation  Participation Level:  Active   Mood:  Description of Group:    The purpose of this group is to assist patients in learning to regulate negative emotions and experience positive emotions. Patients will be guided to discuss ways in which they have been vulnerable to their negative emotions. These vulnerabilities will be juxtaposed with experiences of positive emotions or situations, and patients challenged to use positive emotions to combat negative ones. Special emphasis will be placed on coping with negative emotions in conflict situations, and patients will process healthy conflict resolution skills.  Therapeutic Goals: Patient will identify two positive emotions or experiences to reflect on in order to balance out negative emotions:  Patient will label two or more emotions that they find the most difficult to experience:  Patient will be able to demonstrate positive conflict resolution skills through discussion or role plays:   Summary of Patient Progress:   Patient participated appropriately in group.  He shared that he got good news from his social worker today which put a smile on his face. Patient shared mantra that assists with regulating emotions with group members, "this too shall pass." Patient offered and provided feedback to group members.     Therapeutic Modalities:   Cognitive Behavioral Therapy Feelings Identification Dialectical Behavioral Therapy   Arista Kettlewell E Troyce Gieske, LCSW

## 2021-02-16 NOTE — Progress Notes (Signed)
°   02/15/21 2215  Psych Admission Type (Psych Patients Only)  Admission Status Involuntary  Psychosocial Assessment  Patient Complaints None  Eye Contact Brief  Facial Expression Flat  Affect Flat  Speech Logical/coherent  Interaction Minimal  Motor Activity Other (Comment) (WNL)  Appearance/Hygiene Unremarkable  Behavior Characteristics Cooperative  Mood Pleasant  Thought Process  Coherency WDL  Content WDL  Delusions Paranoid  Perception Hallucinations  Hallucination Auditory  Judgment Impaired  Confusion None  Danger to Self  Current suicidal ideation? Denies  Danger to Others  Danger to Others None reported or observed

## 2021-02-16 NOTE — Group Note (Signed)
Date:  02/16/2021 Time:  9:54 AM  Group Topic/Focus:  Orientation:   The focus of this group is to educate the patient on the purpose and policies of crisis stabilization and provide a format to answer questions about their admission.  The group details unit policies and expectations of patients while admitted.    Participation Level:  None  Participation Quality:  Inattentive  Affect:  Blunted  Cognitive:  Lacking  Insight: Good  Engagement in Group:  Lacking  Modes of Intervention:  Discussion  Additional Comments:    Reymundo Poll 02/16/2021, 9:54 AM

## 2021-02-16 NOTE — Progress Notes (Signed)
Patient did attend the evening NA meeting. ° °

## 2021-02-16 NOTE — Progress Notes (Signed)
[facility] MD Progress Note  02/16/2021 5:10 PM Wesley Peterson  MRN: [id number]  CC: acute psychosis  Reason for Admission: Wesley Peterson is a 36 y.o. male with PPHx schizophrenia vs schizoaffective d/o who presented to [facility] ED for acute psychosis, agitation and concern for HI. He was then admitted involuntarily to [facility] for treatment of psychosis exacerbated by poor therapeutic compliance. [place] Day 14    Information Obtained Today During Patient Interview: Patient was evaluated today on the unit, he was still paranoid and guarded but cooperative and pleasant on exam.  He reported that AH was still present when patient is alone, especially in the restroom, saying "that's ours" or "that's theirs". He continued to describe the [place] as his stepbrother's voice, and continues to believe that somehow his family are causing his hallucination symptoms. He denied thought insertion, thought broadcasting, ideas of reference, and that his family is mind controlling him.  Patient reported that the increase in zyprexa really improved his sleep, stating that he did not wake up at all last night. However he reported sleeping too much and missing breakfast this am.  He reported that his mood is not great, but improved compared to yesterday.  He reported improved appetite last night. He denied SI/HI/VH and contracted to safety on the unit. Patient denied noticing symptoms on tardive dyskinesia and that his muscles are less stiff, but his L shoulder still hurts after doing pushups. Discussed continuing with cross tapering abilify to zyprexa and continuing the antidepressant, patient was amenable with the plan.   Diagnosis:  Principal Problem:   Schizoaffective disorder, bipolar type ([place]) Active Problems:   Homeless   Tardive dyskinesia  Past Psychiatric History: See H&P  Past Medical History:  Past Medical History:  Diagnosis Date   Schizophrenia ([place])    Schizophrenia, paranoid type ([place]) 02/02/2021    Family History: see H&P  Family Psychiatric  History: See H&P  Social History:  Social History   Substance and Sexual Activity  Alcohol Use Not Currently   Alcohol/week: 1.0 standard drink   Types: 1 Cans of beer per week   Comment: occasional     Social History   Substance and Sexual Activity  Drug Use Never    Social History   Socioeconomic History   Marital status: Single    Spouse name: Not on file   Number of children: 1   Years of education: Not on file   Highest education level: Not on file  Occupational History   Occupation: Unemployed  Tobacco Use   Smoking status: Never   Smokeless tobacco: Never  Vaping Use   Vaping Use: Never used  Substance and Sexual Activity   Alcohol use: Not Currently    Alcohol/week: 1.0 standard drink    Types: 1 Cans of beer per week    Comment: occasional   Drug use: Never   Sexual activity: Never  Other Topics Concern   Not on file  Social History Narrative   ** Merged History Encounter **       Pt lives with mother and other relatives.  Currently unemployed.  [place] [facility].   Social Determinants of Health   Financial Resource Strain: Not on file  Food Insecurity: Not on file  Transportation Needs: Not on file  Physical Activity: Not on file  Stress: Not on file  Social Connections: Not on file    Sleep: Improved  Appetite: Fair, improved  Current Medications: Current Facility-Administered Medications  Medication Dose Route Frequency Provider Last Rate Last Admin   acetaminophen (TYLENOL) tablet 650 mg  650 mg Oral Q6H PRN Ethelene Hal, NP   650 mg at 02/13/21 0812   alum & mag hydroxide-simeth (MAALOX/MYLANTA) 200-200-20 MG/5ML suspension 30 mL  30 mL Oral Q4H PRN Ethelene Hal, NP       Derrill Memo ON 02/17/2021] ARIPiprazole (ABILIFY) tablet 5 mg  5 mg Oral Daily Merrily Brittle, DO       benztropine (COGENTIN) tablet 1 mg  1 mg Oral TID Merrily Brittle, DO    1 mg at 02/16/21 1649   docusate sodium (COLACE) capsule 100 mg  100 mg Oral Daily Nelda Marseille, Amy E, MD   100 mg at 02/14/21 [id number]   FLUoxetine (PROZAC) capsule 10 mg  10 mg Oral Daily Merrily Brittle, DO   10 mg at 02/16/21 [id number]   hydrOXYzine (ATARAX) tablet 25 mg  25 mg Oral TID PRN Ethelene Hal, NP   25 mg at 02/13/21 [id number]   ibuprofen (ADVIL) tablet 600 mg  600 mg Oral Q6H PRN Harlow Asa, MD   600 mg at 02/16/21 1335   OLANZapine zydis (ZYPREXA) disintegrating tablet 5 mg  5 mg Oral Q8H PRN Merrily Brittle, DO       And   LORazepam (ATIVAN) tablet 1 mg  1 mg Oral PRN Merrily Brittle, DO       And   ziprasidone (GEODON) injection 20 mg  20 mg Intramuscular PRN Merrily Brittle, DO       magnesium hydroxide (MILK OF MAGNESIA) suspension 30 mL  30 mL Oral Daily PRN Ethelene Hal, NP   30 mL at 02/13/21 1515   melatonin tablet 3 mg  3 mg Oral QHS Merrily Brittle, DO   3 mg at 02/15/21 2109   OLANZapine (ZYPREXA) tablet 15 mg  15 mg Oral QHS Merrily Brittle, DO       traZODone (DESYREL) tablet 100 mg  100 mg Oral QHS PRN Harlow Asa, MD       valbenazine [facility]) capsule 40 mg  40 mg Oral QPM Merrily Brittle, DO        Lab Results:  No results found for this or any previous visit (from the past 48 hour(s)).   Blood Alcohol level:  Lab Results  Component Value Date   ETH <10 02/02/2021   ETH <10 [id number]    Metabolic Disorder Labs: Lab Results  Component Value Date   HGBA1C 6.0 (H) 02/03/2021   MPG 126 02/03/2021   MPG 114 02/02/2021   Lab Results  Component Value Date   PROLACTIN 23.4 (H) 11/28/2018   Lab Results  Component Value Date   CHOL 131 01/30/2021   TRIG 104 01/30/2021   HDL 30 (L) 01/30/2021   CHOLHDL 4.4 01/30/2021   VLDL 21 01/30/2021   LDLCALC 80 01/30/2021   LDLCALC 69 08/25/2019    Physical Findings:  Musculoskeletal: Strength & Muscle Tone: within normal limits Gait & Station: normal Patient leans: N/A  Psychiatric Specialty  Exam:  Presentation  General Appearance: casually dressed, fair hygiene  Eye Contact: Fair, would still stare off when talking about psychotic sxs  Speech: Clear and coherent, normal rate, more talkative  Speech Volume:Decreased  Mood and Affect  Mood: Depressed, frustrated  Affect: Depressed, restricted, congruent, no inappropriate smiling today   Thought Process  Thought Processes: Concrete, difficulty expressing thoughts concerning paranoia about family  Orientation: Oriented to self, month, year and President  Thought Content: Endorsed AH making possessive comments - non-command in nature. Endorses paranoia and persecutory delusions about family causing his psychotic sxs. Denied VH, thought broadcasting, thought insertion or ideas of reference.   History of Schizophrenia/Schizoaffective disorder:Yes  Duration of Psychotic Symptoms:Greater than six months  Hallucinations: AH making possessive statements  Ideas of Reference:None   Suicidal Thoughts:None  Homicidal Thoughts:None   Sensorium  Memory:Immediate Fair; Recent Fair; Remote Fair  Judgment: Fair - compliant with meds  Insight:Lacking   Executive Functions  Concentration:Fair  Attention Span:Fair  [place]   Psychomotor Activity  Psychomotor Activity: Involuntary movement laterally of lower jaw with distraction-minimal, nearly gone; no puckering, lip smacking or involuntary tongue movements, no truncal or extremity involuntary movements noted; no cogwheeling bilateral UE; no tremors noted in extremities, no stiffness today.  Bilateral wrist and elbow cogwheeling AIMS 2  Assets  Assets:Social Support; Leisure Time  Sleep  5.5 hours   Physical Exam: Vitals:   02/15/21 1651 02/16/21 0822 02/16/21 0823 02/16/21 1625  BP: 118/82 129/88 (!) 128/92 137/87  Pulse: 64 65 87 (!) 108  Temp:  98 F (36.7 C)    Resp:      Height:      Weight:       SpO2:  99%  98%  TempSrc:  Oral    BMI (Calculated):         Physical Exam Vitals and nursing note reviewed.  Constitutional:      General: He is awake. He is not in acute distress.    Appearance: He is not ill-appearing or diaphoretic.  HENT:     Head: Normocephalic.  Pulmonary:     Effort: Pulmonary effort is normal.  Neurological:     General: No focal deficit present.     Mental Status: He is alert.  Psychiatric:        Behavior: Behavior is cooperative.    Review of Systems  Respiratory:  Negative for shortness of breath.   Cardiovascular:  Negative for chest pain.  Gastrointestinal:  Negative for nausea and vomiting.  Neurological:  Negative for dizziness and headaches.    Treatment Plan & Assessment Summary: Principal Problem:   Schizoaffective disorder, bipolar type ([place]) Active Problems:   Homeless   Tardive dyskinesia  PLAN: Schizoaffective d/o bipolar type (r/o schizophrenia) Patient's AH appears unchanged compared to yesterday, however zyprexa has improved his sleep and appetite. He still has paranoid and persecutory delusions along with AH and poor reality testing. He appeared to have adequate energy despite tapering off abilify. He is tolerating med changes well. S/p Haldol Decanoate 100 mg IM (01/29/2021) Cross titrate abilify to zyprexa given inefficiency in treating residual psychosis.  QTc 424ms on 02/03/21 and 358ms on 02/09/21, A1c 6.0, Lipids WNL other than HDL 30 Holding stimulant and advised not to restart Adderall  Continued Melatonin 3mg  qhs  Tardive Dyskinesia and EPS Minimal lateral jaw movement, very subtle and emerges only during distraction, patient noticed when asked, but is unbothered. No cogwheeling on exam. Continued to monitor with AIMS daily Continued Cogentin 1 TID, for bilateral cogwheeling noted on exam with plan to decrease since tapering off abilify Continued Ingrezza 40 mg qHS   Prediabetes A1c 6.0 Recommended follow-up with  PCP after discharge Continued to encourage lifestyle modification Will consider metformin if patient is amenable, however unsure of med adherence after dc given hx and disease  Constipation Continued Colace schedule 100mg  qd  Encouraged MOM and increased fluid and fiber  Hep B SAg (+) Consistent with Hep B vaccination immunity status. Hep B labs from 11/03/20 at [facility] showed Hep B SAg (+), however patient received HEP B #2 on 09/16/20 at [facility]. HepB labs were negative on 07/27/20. Cannot rule out past infection of HBV that he has recovered from. Given HepB Core Ab and HepB S Ag are NR (2/1), patient does not have active HepB infection. Hep B DNA PCR showed no HBV DNA detected. Acute hepatitis panel also negative on 02/09/21 for Hep C Ab and Hep A IgM  Dispo: Patient, No Pcp Per (Inactive)  Dispo: homeless shelter (Patient was living with father prior to admission) Pending clinical improvement IVC'd by father Flabio Vradenburg [phone] Will have patient f/u with medication assistance program for Ingrezza  Patient's next Haldol Decanoate 100 mg IM injection is due on 02/24/2021  Safety, monitoring and disposition planning: The patient was seen and evaluated on the unit.  The patient's chart was reviewed and nursing notes were reviewed.  The patient's case was discussed in multidisciplinary team meeting.  Plan and drug side effects were discussed with patient who was amendable. Social work and case management to assist with discharge planning and identification of hospital follow-up needs prior to discharge. Discharge Concerns: Need to establish a safety plan; Medication compliance and effectiveness Discharge Goals: Return home with outpatient referrals for mental health follow-up including medication management/psychotherapy Safety and Monitoring: Involuntary status at inpatient psychiatric unit for safety, stabilization and treatment Daily  contact with patient to assess and evaluate symptoms and progress in treatment and medical management Patient's case to be discussed in multi-disciplinary team meeting Observation Level: Per above Vital signs:  q12 hours Precautions: suicide   Signed: Merrily Brittle, DO Psychiatry Resident, PGY-1 [facility] 02/16/2021, 5:10 PM

## 2021-02-17 MED ORDER — BENZTROPINE MESYLATE 1 MG PO TABS
1.0000 mg | ORAL_TABLET | Freq: Two times a day (BID) | ORAL | Status: DC | PRN
  Filled 2021-02-17: qty 14

## 2021-02-17 MED ORDER — FLUOXETINE HCL 20 MG PO CAPS
20.0000 mg | ORAL_CAPSULE | Freq: Every day | ORAL | Status: DC
  Administered 2021-02-18 – 2021-02-21 (×4): 20 mg via ORAL
  Filled 2021-02-17: qty 1
  Filled 2021-02-17: qty 7
  Filled 2021-02-17 (×4): qty 1
  Filled 2021-02-17: qty 7

## 2021-02-17 MED ORDER — LIDOCAINE 5 % EX PTCH
1.0000 | MEDICATED_PATCH | Freq: Every evening | CUTANEOUS | Status: DC
  Administered 2021-02-17 – 2021-02-20 (×4): 1 via TRANSDERMAL
  Filled 2021-02-17 (×6): qty 1

## 2021-02-17 NOTE — Progress Notes (Signed)
[facility] MD Progress Note  02/17/2021 4:46 PM Wesley Peterson  MRN: [id number]  CC: acute psychosis  Reason for Admission: Wesley Peterson is a 36 y.o. male with PPHx schizophrenia vs schizoaffective d/o who presented to [facility] ED for acute psychosis, agitation and concern for HI. He was then admitted involuntarily to [facility] for treatment of psychosis exacerbated by poor therapeutic compliance. Wesley Peterson Day 15   Information Obtained Today During Patient Interview: Patient was evaluated in patient's room on [address].  Patient was cooperative and pleasant with interview.  Patient reported ongoing paranoid delusions about his family causing all the symptoms.  He reported that he can still hear his stepbrother's voice saying "that is theirs", however less frequently than days prior.  Patient ruminated on how and why his family would "mess with me like this".  Patient is apprehensive about returning home to family, stated that he will likely return to a shelter that he had been prior.  He continued report feeling "down", however it is improving.  He also reported still feeling worried, although it is also improving.  He reported improved energy, concentration, sleep, appetite, although still having difficulties with each.  Discussed with patient his medications and diagnosis.  Discussed with him about lab work-up, patient was amenable.  He continued to show poor insight on his diagnosis.  He was amenable to continuing with cross tapering of medications.  Discussed with him the importance of adhering to his medicines, and that he needs to continue get his LAI. Patient denied SI/HI/AH, first rank symptoms, and contracted to safety on the unit. Patient was not grossly responding to internal/external stimuli nor made any delusional statements during encounter.   Diagnosis:  Principal Problem:   Schizoaffective disorder, bipolar type ([place]) Active Problems:   Homeless   Tardive dyskinesia  Past Psychiatric  History: See H&P  Past Medical History:  Past Medical History:  Diagnosis Date   Schizophrenia ([place])    Schizophrenia, paranoid type ([place]) 02/02/2021   Family History: see H&P  Family Psychiatric  History: See H&P  Social History:  Social History   Substance and Sexual Activity  Alcohol Use Not Currently   Alcohol/week: 1.0 standard drink   Types: 1 Cans of beer per week   Comment: occasional     Social History   Substance and Sexual Activity  Drug Use Never    Social History   Socioeconomic History   Marital status: Single    Spouse name: Not on file   Number of children: 1   Years of education: Not on file   Highest education level: Not on file  Occupational History   Occupation: Unemployed  Tobacco Use   Smoking status: Never   Smokeless tobacco: Never  Vaping Use   Vaping Use: Never used  Substance and Sexual Activity   Alcohol use: Not Currently    Alcohol/week: 1.0 standard drink    Types: 1 Cans of beer per week    Comment: occasional   Drug use: Never   Sexual activity: Never  Other Topics Concern   Not on file  Social History Narrative   ** Merged History Encounter **       Pt lives with mother and other relatives.  Currently unemployed.  [place] outpatient psychiatry services through [facility]   Social Determinants of Health   Financial Resource Strain: Not on file  Food Insecurity: Not on file  Transportation Needs: Not on file  Physical Activity: Not on file  Stress: Not  on file  Social Connections: Not on file    Sleep: Improved  Appetite: Fair, improved  Current Medications: Current Facility-Administered Medications  Medication Dose Route Frequency Provider Last Rate Last Admin   acetaminophen (TYLENOL) tablet 650 mg  650 mg Oral Q6H PRN Ethelene Hal, NP   650 mg at 02/13/21 0812   alum & mag hydroxide-simeth (MAALOX/MYLANTA) 200-200-20 MG/5ML suspension 30 mL  30 mL Oral Q4H PRN Ethelene Hal, NP        benztropine (COGENTIN) tablet 1 mg  1 mg Oral BID PRN Merrily Brittle, DO       [START ON 02/18/2021] FLUoxetine (PROZAC) capsule 20 mg  20 mg Oral Daily Merrily Brittle, DO       hydrOXYzine (ATARAX) tablet 25 mg  25 mg Oral TID PRN Ethelene Hal, NP   25 mg at 02/13/21 [id number]   ibuprofen (ADVIL) tablet 600 mg  600 mg Oral Q6H PRN Harlow Asa, MD   600 mg at 02/17/21 1347   lidocaine (LIDODERM) 5 % 1 patch  1 patch Transdermal QPM Merrily Brittle, DO       OLANZapine zydis (ZYPREXA) disintegrating tablet 5 mg  5 mg Oral Q8H PRN Merrily Brittle, DO       And   LORazepam (ATIVAN) tablet 1 mg  1 mg Oral PRN Merrily Brittle, DO       And   ziprasidone (GEODON) injection 20 mg  20 mg Intramuscular PRN Merrily Brittle, DO       magnesium hydroxide (MILK OF MAGNESIA) suspension 30 mL  30 mL Oral Daily PRN Ethelene Hal, NP   30 mL at 02/13/21 1515   melatonin tablet 3 mg  3 mg Oral QHS Merrily Brittle, DO   3 mg at 02/16/21 2101   OLANZapine (ZYPREXA) tablet 15 mg  15 mg Oral QHS Merrily Brittle, DO   15 mg at 02/16/21 2102   traZODone (DESYREL) tablet 100 mg  100 mg Oral QHS PRN Harlow Asa, MD       valbenazine [facility]) capsule 40 mg  40 mg Oral QPM Merrily Brittle, DO   40 mg at 02/16/21 2101    Lab Results:  No results found for this or any previous visit (from the past 48 hour(s)).   Blood Alcohol level:  Lab Results  Component Value Date   ETH <10 02/02/2021   ETH <10 [id number]    Metabolic Disorder Labs: Lab Results  Component Value Date   HGBA1C 6.0 (H) 02/03/2021   MPG 126 02/03/2021   MPG 114 02/02/2021   Lab Results  Component Value Date   PROLACTIN 23.4 (H) 11/28/2018   Lab Results  Component Value Date   CHOL 131 01/30/2021   TRIG 104 01/30/2021   HDL 30 (L) 01/30/2021   CHOLHDL 4.4 01/30/2021   VLDL 21 01/30/2021   LDLCALC 80 01/30/2021   LDLCALC 69 08/25/2019    Physical Findings:  Musculoskeletal: Strength & Muscle Tone: within normal  limits Gait & Station: normal Patient leans: N/A  Psychiatric Specialty Exam:  Presentation  General Appearance: casually dressed, adequate hygiene  Eye Contact: Fair, would still stare off when talking about psychotic sxs, however good when discussing other topics unrelated to psych  Speech: Clear and coherent, normal rate, spontaneous  Speech Volume:Decreased  Mood and Affect  Mood: "Down"  Affect: Brighter affect, still depressed, congruent, appropriate, constricted   Thought Process  Thought Processes: Concrete, ruminating on paranoid and persecutory delusions, otherwise  coherent, linear, goal directed  Orientation: Oriented to self, month, year and President  Thought Content: Endorsed AH making possessive comments - non-command in nature. Endorses paranoia and persecutory delusions about family causing his psychotic sxs. Denied VH, thought broadcasting, thought insertion or ideas of reference.   History of Schizophrenia/Schizoaffective disorder:Yes  Duration of Psychotic Symptoms:Greater than six months  Hallucinations: AH making possessive statements  Ideas of Reference:None   Suicidal Thoughts:None  Homicidal Thoughts:None   Sensorium  Memory:Immediate Fair; Recent Fair; Remote Fair  Judgment: Fair - compliant with meds  Insight:Lacking   Executive Functions  Concentration:Fair  Attention Span:Fair  [place]   Psychomotor Activity  Psychomotor Activity: Involuntary movement laterally of lower jaw with distraction-minimal, nearly gone; no puckering, lip smacking or involuntary tongue movements, no truncal or extremity involuntary movements noted; no cogwheeling bilateral UE; no tremors noted in extremities, no stiffness.  AIMS 2  Assets  Assets:Social Support; Leisure Time  Sleep  5.5 hours   Physical Exam: Vitals:   02/16/21 1625 02/17/21 0625 02/17/21 0626 02/17/21 1623  BP: 137/87 (!) 127/91  128/86 127/86  Pulse: (!) 108 63 78 (!) 102  Temp:  97.7 F (36.5 C)    Resp:      Height:      Weight:      SpO2: 98% 99%  97%  TempSrc:  Oral    BMI (Calculated):         Physical Exam Vitals and nursing note reviewed.  Constitutional:      General: He is awake. He is not in acute distress.    Appearance: He is not ill-appearing or diaphoretic.  HENT:     Head: Normocephalic.  Pulmonary:     Effort: Pulmonary effort is normal.  Neurological:     General: No focal deficit present.     Mental Status: He is alert.  Psychiatric:        Behavior: Behavior is cooperative.    Review of Systems  Respiratory:  Negative for shortness of breath.   Cardiovascular:  Negative for chest pain.  Gastrointestinal:  Negative for nausea and vomiting.  Neurological:  Negative for dizziness and headaches.    Treatment Plan & Assessment Summary: Principal Problem:   Schizoaffective disorder, bipolar type ([place]) Active Problems:   Homeless   Tardive dyskinesia  PLAN: Schizoaffective d/o bipolar type (r/o schizophrenia), S/p Haldol Decanoate 100 mg IM (01/29/2021) QTc 480ms on 02/03/21 and 363ms on 02/09/21, A1c 6.0, Lipids WNL other than HDL 30. Patient's affect is brighter, and thought process were linear, coherent, and goal directed.  He still ruminates on his delusions per above.  However did not appear to be internally preoccupied.  He also reported decreased frequency in his AH. Cross titrate abilify to zyprexa given inefficiency in treating residual psychosis, today's last day of Abilify.  Continuing Zyprexa 15 mg nightly Holding stimulant and advised not to restart Adderall  Continued Melatonin 3mg  qhs  Tardive Dyskinesia and EPS Minimal lateral jaw movement, very subtle and emerges only during distraction, patient noticed when asked, but is unbothered. No cogwheeling on exam.  No restlessness or dystonia, low suspicion of EPS symptoms given switching from Abilify to Zyprexa per above.   Will change Cogentin to PRN to help with pill burden Continued to monitor with AIMS daily Changed Cogentin 1 TID to PRN TID Continued Ingrezza 40 mg nightly  Prediabetes A1c 6.0 Recommended follow-up with PCP after discharge Continued to encourage lifestyle modification Will consider  metformin if patient is amenable, however unsure of med adherence after dc given hx and disease  Constipation Continued Colace schedule 100mg  qd  Encouraged MOM and increased fluid and fiber  Hep B SAg (+) Consistent with Hep B vaccination immunity status. Hep B labs from 11/03/20 at [facility] showed Hep B SAg (+), however patient received HEP B #2 on 09/16/20 at [facility]. HepB labs were negative on 07/27/20. Cannot rule out past infection of HBV that he has recovered from. Given HepB Core Ab and HepB S Ag are NR (2/1), patient does not have active HepB infection. Hep B DNA PCR showed no HBV DNA detected. Acute hepatitis panel also negative on 02/09/21 for Hep C Ab and Hep A IgM  Dispo: Patient, No Pcp Per (Inactive)  Dispo: homeless shelter (Patient was living with father prior to admission) Pending clinical improvement IVC'd by father Wesley Peterson [phone] Will have patient f/u with medication assistance program for Ingrezza  Patient's next Haldol Decanoate 100 mg IM injection is due on 02/24/2021  Safety, monitoring and disposition planning: The patient was seen and evaluated on the unit.  The patient's chart was reviewed and nursing notes were reviewed.  The patient's case was discussed in multidisciplinary team meeting.  Plan and drug side effects were discussed with patient who was amendable. Social work and case management to assist with discharge planning and identification of hospital follow-up needs prior to discharge. Discharge Concerns: Need to establish a safety plan; Medication compliance and effectiveness Discharge Goals: Return home with  outpatient referrals for mental health follow-up including medication management/psychotherapy Safety and Monitoring: Involuntary status at inpatient psychiatric unit for safety, stabilization and treatment Daily contact with patient to assess and evaluate symptoms and progress in treatment and medical management Patient's case to be discussed in multi-disciplinary team meeting Observation Level: Per above Vital signs:  q12 hours Precautions: suicide   Signed: Merrily Brittle, DO Psychiatry Resident, PGY-1 [facility] 02/17/2021, 4:46 PM

## 2021-02-17 NOTE — Progress Notes (Signed)
[facility] Group Notes:  (Nursing/MHT/Case Management/Adjunct)  Date:  02/17/2021  Time:  2015  Type of Therapy:   wrap up group  Participation Level:  Active  Participation Quality:  Appropriate, Attentive, Sharing, and Supportive  Affect:  Flat  Cognitive:  Alert  Insight:  Improving  Engagement in Group:  Engaged  Modes of Intervention:  Clarification, Education, and Support  Summary of Progress/Problems: Positive thinking and positive change were discussed.   Marcille Buffy 02/17/2021, 9:18 PM

## 2021-02-17 NOTE — Progress Notes (Signed)
°   02/17/21 0820  Psych Admission Type (Psych Patients Only)  Admission Status Involuntary  Psychosocial Assessment  Patient Complaints None  Eye Contact Brief  Facial Expression Flat  Affect Flat  Speech Logical/coherent  Interaction Minimal  Motor Activity Other (Comment) (WNL)  Appearance/Hygiene Unremarkable  Behavior Characteristics Cooperative  Mood Euthymic;Pleasant  Thought Process  Coherency WDL  Content WDL  Delusions None reported or observed  Perception Hallucinations  Hallucination Auditory  Judgment Impaired  Confusion None  Danger to Self  Current suicidal ideation? Denies  Danger to Others  Danger to Others None reported or observed

## 2021-02-17 NOTE — Plan of Care (Signed)
°  Problem: Education: Goal: Emotional status will improve Outcome: Progressing Goal: Mental status will improve Outcome: Progressing   Problem: Activity: Goal: Interest or engagement in activities will improve Outcome: Progressing Goal: Sleeping patterns will improve Outcome: Progressing   Problem: Coping: Goal: Ability to demonstrate self-control will improve Outcome: Progressing   Problem: Health Behavior/Discharge Planning: Goal: Compliance with treatment plan for underlying cause of condition will improve Outcome: Progressing   

## 2021-02-17 NOTE — Plan of Care (Signed)
°  Problem: Coping: ?Goal: Ability to verbalize frustrations and anger appropriately will improve ?Outcome: Progressing ?  ?Problem: Coping: ?Goal: Ability to demonstrate self-control will improve ?Outcome: Progressing ?  ?Problem: Safety: ?Goal: Periods of time without injury will increase ?Outcome: Progressing ?  ?Problem: Education: ?Goal: Knowledge of the prescribed therapeutic regimen will improve ?Outcome: Progressing ?  ?

## 2021-02-17 NOTE — Group Note (Signed)
Date:  02/18/2021 Time:  3:03 PM  Group Topic/Focus:  Orientation:   The focus of this group is to educate the patient on the purpose and policies of crisis stabilization and provide a format to answer questions about their admission.  The group details unit policies and expectations of patients while admitted.    Participation Level:  Minimal  Participation Quality:  Inattentive  Affect:  Blunted  Cognitive:  Lacking  Insight: Lacking  Engagement in Group:  Limited  Modes of Intervention:  Education  Additional Comments:  Not attentive in group.  Jerrye Beavers 02/18/2021, 3:03 PM

## 2021-02-17 NOTE — [facility] Group Notes (Signed)
Adult Psychoeducational Group Note  Date:  02/17/2021 Time:  2:55 PM  Group Topic/Focus:  Wellness Toolbox:   The focus of this group is to discuss various aspects of wellness, balancing those aspects and exploring ways to increase the ability to experience wellness.  Patients will create a wellness toolbox for use upon discharge.  Participation Level:  Active  Participation Quality:  Attentive  Affect:  Appropriate  Cognitive:  Alert  Insight: Appropriate  Engagement in Group:  Engaged  Modes of Intervention:  Activity  Additional Comments:  Patient attended and participated in the relaxation group activity.  Jearl Klinefelter 02/17/2021, 2:55 PM

## 2021-02-17 NOTE — Progress Notes (Signed)
° ° °   02/17/21 2125  Psych Admission Type (Psych Patients Only)  Admission Status Involuntary  Psychosocial Assessment  Patient Complaints Anxiety;Depression  Eye Contact Brief  Facial Expression Flat  Affect Flat  Speech Logical/coherent;Slow;Soft  Interaction Minimal  Motor Activity Other (Comment) (WDL)  Appearance/Hygiene Unremarkable  Behavior Characteristics Cooperative;Calm  Mood Pleasant;Euthymic  Thought Process  Coherency WDL  Content WDL  Delusions None reported or observed  Perception Hallucinations  Hallucination Auditory  Judgment Impaired  Confusion None  Danger to Self  Current suicidal ideation? Denies  Danger to Others  Danger to Others None reported or observed

## 2021-02-17 NOTE — Progress Notes (Signed)
Please see Downtime BH safety checks from 02/17/2021 @ 0130 - 02/17/2021 @ 0615.  ° °

## 2021-02-18 ENCOUNTER — Encounter (HOSPITAL_COMMUNITY): Payer: Self-pay

## 2021-02-18 LAB — CERULOPLASMIN: Ceruloplasmin: 21.1 mg/dL (ref 16.0–31.0)

## 2021-02-18 NOTE — Progress Notes (Signed)
[facility] MD Progress Note  02/18/2021 12:31 PM Mose Mabe  MRN: [id number]  CC: acute psychosis  Reason for Admission: Wesley Peterson is a 36 y.o. male with PPHx schizophrenia vs schizoaffective d/o who presented to [facility] ED for acute psychosis, agitation and concern for HI. He was then admitted involuntarily to [facility] for treatment of psychosis exacerbated by poor therapeutic compliance. [place] [facility]   Information Obtained Today During Patient Interview: Patient was evaluated in patient's room on [address]. Patient was cooperative and pleasant with interview. His thought process are linear, coherent, goal directed. Patient reported anxiety when he learned that the funeral of his long-time friend was this last Tuesday, however patient reported "I'm ok now". He reported to still feeling down, and did not wish to discuss the reasons. He continued to have delusions of paranoia about his family causing his symptoms AH. Patient agreed that his AH are mood congruent, and discussed that it will likely be less bothersome or resolve with improving mood along with med adherence. Patient showed improved insight, responding "I've thought that too, maybe". Patient still has AH when he is alone mainly, but during interview, patient reported hearing a male voice saying "ahem" x2. Patient reported the last time he heard the voice was this AM when he was changing, and found it to be bothersome. However denied AH during meals, group, and when interacting with the milieu. Patient reported improved energy, however still requiring coffee in the AM. He reported adequate sleep and appetite. Patient reported that he does not want to return home to dad, and prefers to be discharged to the shelter where he is familiar with. Patient denied SI/HI/VH, and contracted to safety on the unit.   Diagnosis:  Principal Problem:   Schizoaffective disorder, bipolar type ([place]) Active Problems:   Homeless   Tardive  dyskinesia  Past Psychiatric History: See H&P  Past Medical History:  Past Medical History:  Diagnosis Date   Schizophrenia ([place])    Schizophrenia, paranoid type ([place]) 02/02/2021   Family History: see H&P  Family Psychiatric  History: See H&P  Social History:  Social History   Substance and Sexual Activity  Alcohol Use Not Currently   Alcohol/week: 1.0 standard drink   Types: 1 Cans of beer per week   Comment: occasional     Social History   Substance and Sexual Activity  Drug Use Never    Social History   Socioeconomic History   Marital status: Single    Spouse name: Not on file   Number of children: 1   Years of education: Not on file   Highest education level: Not on file  Occupational History   Occupation: Unemployed  Tobacco Use   Smoking status: Never   Smokeless tobacco: Never  Vaping Use   Vaping Use: Never used  Substance and Sexual Activity   Alcohol use: Not Currently    Alcohol/week: 1.0 standard drink    Types: 1 Cans of beer per week    Comment: occasional   Drug use: Never   Sexual activity: Never  Other Topics Concern   Not on file  Social History Narrative   ** Merged History Encounter **       Pt lives with mother and other relatives.  Currently unemployed.  [place] outpatient psychiatry services through [facility].   Social Determinants of Health   Financial Resource Strain: Not on file  Food Insecurity: Not on file  Transportation Needs: Not on file  Physical Activity:  Not on file  Stress: Not on file  Social Connections: Not on file    Sleep: Improved  Appetite: Fair, improved  Current Medications: Current Facility-Administered Medications  Medication Dose Route Frequency Provider Last Rate Last Admin   acetaminophen (TYLENOL) tablet 650 mg  650 mg Oral Q6H PRN Ethelene Hal, NP   650 mg at 02/18/21 0924   alum & mag hydroxide-simeth (MAALOX/MYLANTA) 200-200-20 MG/5ML suspension 30 mL  30 mL Oral Q4H PRN Ethelene Hal, NP       benztropine (COGENTIN) tablet 1 mg  1 mg Oral BID PRN Merrily Brittle, DO       FLUoxetine (PROZAC) capsule 20 mg  20 mg Oral Daily Merrily Brittle, DO   20 mg at 02/18/21 0920   hydrOXYzine (ATARAX) tablet 25 mg  25 mg Oral TID PRN Ethelene Hal, NP   25 mg at 02/13/21 [id number]   ibuprofen (ADVIL) tablet 600 mg  600 mg Oral Q6H PRN Harlow Asa, MD   600 mg at 02/17/21 2125   lidocaine (LIDODERM) 5 % 1 patch  1 patch Transdermal QPM Merrily Brittle, DO   1 patch at 02/17/21 2126   OLANZapine zydis (ZYPREXA) disintegrating tablet 5 mg  5 mg Oral Q8H PRN Merrily Brittle, DO       And   LORazepam (ATIVAN) tablet 1 mg  1 mg Oral PRN Merrily Brittle, DO       And   ziprasidone (GEODON) injection 20 mg  20 mg Intramuscular PRN Merrily Brittle, DO       magnesium hydroxide (MILK OF MAGNESIA) suspension 30 mL  30 mL Oral Daily PRN Ethelene Hal, NP   30 mL at 02/13/21 1515   melatonin tablet 3 mg  3 mg Oral QHS Merrily Brittle, DO   3 mg at 02/17/21 2123   OLANZapine (ZYPREXA) tablet 15 mg  15 mg Oral QHS Merrily Brittle, DO   15 mg at 02/17/21 2123   traZODone (DESYREL) tablet 100 mg  100 mg Oral QHS PRN Harlow Asa, MD       valbenazine [facility]) capsule 40 mg  40 mg Oral QPM Merrily Brittle, DO   40 mg at 02/17/21 2121    Lab Results:  Results for orders placed or performed during the hospital encounter of 02/02/21 (from the past 48 hour(s))  Ceruloplasmin     Status: None   Collection Time: 02/17/21  6:37 PM  Result Value Ref Range   Ceruloplasmin 21.1 16.0 - 31.0 mg/dL    Comment: (NOTE) Performed At: [facility] [place], [place] [id number] Rush Farmer MD [id number]      Blood Alcohol level:  Lab Results  Component Value Date   [facility] <10 02/02/2021   ETH <10 [id number]    Metabolic Disorder Labs: Lab Results  Component Value Date   HGBA1C 6.0 (H) 02/03/2021   MPG 126 02/03/2021   MPG 114 02/02/2021   Lab Results   Component Value Date   PROLACTIN 23.4 (H) 11/28/2018   Lab Results  Component Value Date   CHOL 131 01/30/2021   TRIG 104 01/30/2021   HDL 30 (L) 01/30/2021   CHOLHDL 4.4 01/30/2021   VLDL 21 01/30/2021   LDLCALC 80 01/30/2021   LDLCALC 69 08/25/2019    Physical Findings:  Musculoskeletal: Strength & Muscle Tone: within normal limits Gait & Station: normal Patient leans: N/A  Psychiatric Specialty Exam: Physical Exam Vitals and nursing note reviewed.  Constitutional:  General: He is not in acute distress.    Appearance: He is not ill-appearing or diaphoretic.  HENT:     Head: Normocephalic.  Pulmonary:     Effort: Pulmonary effort is normal. No respiratory distress.  Neurological:     General: No focal deficit present.     Mental Status: He is alert.  Psychiatric:        Behavior: Behavior is cooperative.     Review of Systems  Respiratory:  Negative for shortness of breath.   Cardiovascular:  Negative for chest pain.  Gastrointestinal:  Negative for nausea and vomiting.  Neurological:  Negative for dizziness and headaches.    Body mass index is 20.08 kg/m. Pulse Rate:  [102] 102 (02/09 1623) BP: (127)/(86) 127/86 (02/09 1623) SpO2:  [97 %] 97 % (02/09 1623)  General Appearance:  Appropriate for Environment; Casual; Fairly Groomed    Eye Contact:   Poor    Speech:   Clear and Coherent; Normal Rate    Volume:   Normal    Mood:   Depressed ("Down")    Affect:   Congruent; Constricted    Thought Process:  Coherent; Goal Directed; Linear  Descriptions of Associations: Intact  Duration of Psychotic Symptoms:  Greater than six months  Past Diagnosis of Schizophrenia or Psychoactive disorder:  Yes   Orientation:   Full (Time, Place and Person)   Thought Content:   Delusions; Paranoid Ideation (Continued paranoid and persecutory delusions about family. Ruminated on funeral of friend last Tuesday on how he was not able to attend. Does not  want to return home to dad, rather go to shelter)  Hallucinations: Auditory Mood congruent, mainly when alone. This AM when changing, male voice said "his". During interview, male voice "ahem" x2.  Ideas of Reference: None   Suicidal Thoughts:   No   Homicidal Thoughts:   No    Memory:   Immediate Good; Recent Good; Remote Fair    Judgement:   Good   Insight:   Lacking (Believes that AH are due to family "hexing" him rather than schizoaffective d/o)    Psychomotor Activity:   Normal Tardive Dyskinesia No    Concentration:   Fair    Attention Span:   Fair   Recall:   Good    Fund of Knowledge:   Fair    Language:   Good    Handed:   Right    Assets:   Physical Health; Social Support; Talents/Skills    ADL's:  Intact  Sleep:   Good  Number of Hours: 4.25  AIMS: Facial and Oral Movements Muscles of Facial Expression: None, normal Lips and Perioral Area: None, normal Jaw: None, normal Tongue: None, normal,Extremity Movements Upper (arms, wrists, hands, fingers): None, normal Lower (legs, knees, ankles, toes): None, normal, Trunk Movements Neck, shoulders, hips: None, normal, Overall Severity Severity of abnormal movements (highest score from questions above): None, normal Incapacitation due to abnormal movements: None, normal Patient's awareness of abnormal movements (rate only patient's report): No Awareness, Dental Status Current problems with teeth and/or dentures?: No Does patient usually wear dentures?: No   CIWA:   COWS:        Physical Exam: Vitals:   02/16/21 1625 02/17/21 0625 02/17/21 0626 02/17/21 1623  BP: 137/87 (!) 127/91 128/86 127/86  Pulse: (!) 108 63 78 (!) 102  Temp:  97.7 F (36.5 C)    Resp:      Height:      Weight:  SpO2: 98% 99%  97%  TempSrc:  Oral    BMI (Calculated):         Treatment Plan & Assessment Summary: Principal Problem:   Schizoaffective disorder, bipolar type ([place]) Active Problems:    Homeless   Tardive dyskinesia  PLAN: Schizoaffective d/o bipolar type (r/o schizophrenia), S/p Haldol Decanoate 100 mg IM (01/29/2021) QTc 412ms on 02/03/21 and 348ms on 02/09/21, A1c 6.0, Lipids WNL other than HDL 30. Patient's affect is brighter, and thought process were linear, coherent, and goal directed.  He still ruminates on his delusions per above and experienced AH during interview. The AH appear to be mood congruent and stable. Patient is still able to participate in meals, group and is actively integrating in the milieu. The paranoid and persecutory delusions are possibly fixed.  Continued zyprexa 15 mg qHS Holding stimulant and advised not to restart Adderall  Continued Melatonin 3mg  qhs  Tardive Dyskinesia and EPS Minimal lateral jaw movement, very subtle and emerges only during distraction, patient noticed when asked, but is unbothered.  Continued to monitor with AIMS daily Continued Cogentin 1 mg TID PRN Continued Ingrezza 40 mg nightly  Prediabetes A1c 6.0 Recommended follow-up with PCP after discharge Continued to encourage lifestyle modification Will consider metformin if patient is amenable, however unsure of med adherence after dc given hx and disease  Constipation Continued Colace schedule 100mg  qd  Encouraged MOM and increased fluid and fiber  Hep B SAg (+) Consistent with Hep B vaccination immunity status. Hep B labs from 11/03/20 at [facility] showed Hep B SAg (+), however patient received HEP B #2 on 09/16/20 at [facility]. HepB labs were negative on 07/27/20. Cannot rule out past infection of HBV that he has recovered from. Given HepB Core Ab and HepB S Ag are NR (2/1), patient does not have active HepB infection. Hep B DNA PCR showed no HBV DNA detected. Acute hepatitis panel also negative on 02/09/21 for Hep C Ab and Hep A IgM  Dispo: Patient, No Pcp Per (Inactive)  Dispo: homeless shelter (Patient was living with father  prior to admission) Pending clinical improvement IVC'd by father Jomo Bogdanowicz [phone] Will have patient f/u with medication assistance program for Ingrezza  Patient's next Haldol Decanoate 100 mg IM injection is due on 02/24/2021  Safety, monitoring and disposition planning: The patient was seen and evaluated on the unit.  The patient's chart was reviewed and nursing notes were reviewed.  The patient's case was discussed in multidisciplinary team meeting.  Plan and drug side effects were discussed with patient who was amendable. Social work and case management to assist with discharge planning and identification of hospital follow-up needs prior to discharge. Discharge Concerns: Need to establish a safety plan; Medication compliance and effectiveness Discharge Goals: Return home with outpatient referrals for mental health follow-up including medication management/psychotherapy Safety and Monitoring: Involuntary status at inpatient psychiatric unit for safety, stabilization and treatment Daily contact with patient to assess and evaluate symptoms and progress in treatment and medical management Patient's case to be discussed in multi-disciplinary team meeting Observation Level: Per above Vital signs:  q12 hours Precautions: suicide   Signed: Merrily Brittle, DO Psychiatry Resident, PGY-1 [facility] 02/18/2021, 12:31 PM

## 2021-02-18 NOTE — Group Note (Signed)
LCSW Group Therapy Note   Group Date: 02/18/2021 Start Time: 1300 End Time: 1400   Type of Therapy and Topic:  Group Therapy:  Strengths Exploration   Participation Level: Active  Description of Group: This group allows individuals to explore their strengths, learn to use strengths in new ways to improve well-being. Strengths-based interventions involve identifying strengths, understanding how they are used, and learning new ways to apply them. Individuals will identify their strengths, and then explore their roles in different areas of life (relationships, professional life, and personal fulfillment). Individuals will think about ways in which they currently use their strengths, along with new ways they could begin using them.    Therapeutic Goals Patient will verbalize two of their strengths Patient will identify how their strengths are currently used Patient will identify two new ways to apply their strengths  Patients will create a plan to apply their strengths in their daily lives     Summary of Patient Progress:  Pt attended group and remained there the entire time.  The Pt accepted the worksheet and followed along, filling in the worksheet, participating, and following directions.  The Pt demonstrated understanding of the topic and showed respect for their peers during the group discussion.     Therapeutic Modalities Cognitive Behavioral Therapy Motivational Interviewing  Aram Beecham, [place] 02/18/2021  2:17 PM

## 2021-02-18 NOTE — BHH Group Notes (Signed)
[facility] Group Notes:  (Nursing/MHT/Case Management/Adjunct)  Date:  02/18/2021  Time:  3:00 PM  Type of Therapy:  Psychoeducational Skills  Participation Level:  Minimal  Participation Quality:  Appropriate and Inattentive  Affect:  Blunted  Cognitive:  Appropriate  Insight:  Good  Engagement in Group:  Poor  Modes of Intervention:  Education  Summary of Progress/Problems:Patient was not attentive during  group today.  Jerrye Beavers 02/18/2021, 3:00 PM

## 2021-02-18 NOTE — Group Note (Signed)
Recreation Therapy Group Note   Group Topic:Team Building  Group Date: 02/18/2021 Start Time: 0930 End Time: 0955 Facilitators: Caroll Rancher, LRT,CTRS Location: [address] Dayroom   Goal Area(s) Addresses:  Patient will effectively work with peer towards shared goal.  Patient will identify skills used to make activity successful.  Patient will identify how skills used during activity can be applied to reach post d/c goals.   Group Description: [employer]. In teams of 5-6, patients were given 12 craft pipe cleaners. Using the materials provided, patients were instructed to compete again the opposing team(s) to build the tallest free-standing structure from floor level. The activity was timed; difficulty increased by Clinical research associate as Production designer, theatre/television/film continued.  Systematically resources were removed with additional directions for example, placing one arm behind their back, working in silence, and shape stipulations.    Affect/Mood: Appropriate   Participation Level: Moderate   Participation Quality: Independent   Behavior: Appropriate and Defiant   Speech/Thought Process: Focused   Insight: Good   Judgement: Good   Modes of Intervention: Competitive Play   Patient Response to Interventions:  Attentive   Education Outcome:  Acknowledges education and In group clarification offered    Clinical Observations/Individualized Feedback: Pt was quiet and started off being active in completing activity.  Pt became more observant as peers continued to work on the activity.    Plan: Continue to engage patient in RT group sessions 2-3x/week.   Caroll Rancher, LRT,CTRS 02/18/2021 12:14 PM

## 2021-02-18 NOTE — Group Note (Signed)
Date:  02/18/2021 Time:  10:30 AM  Group Topic/Focus:  Orientation:   The focus of this group is to educate the patient on the purpose and policies of crisis stabilization and provide a format to answer questions about their admission.  The group details unit policies and expectations of patients while admitted.    Participation Level:  Did Not Attend  Participation Quality:    Affect:    Cognitive:    Insight:   Engagement in Group:    Modes of Intervention:    Additional Comments:  Was invited but did not attend   Reymundo Poll 02/18/2021, 10:30 AM

## 2021-02-18 NOTE — BH IP Treatment Plan (Signed)
Interdisciplinary Treatment and Diagnostic Plan Update  02/18/2021 Wesley Peterson MRN: [id number]  Principal Diagnosis: Schizoaffective disorder, bipolar type [facility])  Secondary Diagnoses: Principal Problem:   Schizoaffective disorder, bipolar type ([facility]) Active Problems:   Homeless   Tardive dyskinesia   Current Medications:  Current Facility-Administered Medications  Medication Dose Route Frequency Provider Last Rate Last Admin   acetaminophen (TYLENOL) tablet 650 mg  650 mg Oral Q6H PRN Laveda Abbe, NP   650 mg at 02/18/21 0924   alum & mag hydroxide-simeth (MAALOX/MYLANTA) 200-200-20 MG/5ML suspension 30 mL  30 mL Oral Q4H PRN Laveda Abbe, NP       benztropine (COGENTIN) tablet 1 mg  1 mg Oral BID PRN [name], DO       FLUoxetine (PROZAC) capsule 20 mg  20 mg Oral Daily [name], DO   20 mg at 02/18/21 0920   hydrOXYzine (ATARAX) tablet 25 mg  25 mg Oral TID PRN Laveda Abbe, NP   25 mg at 02/13/21 7124   ibuprofen (ADVIL) tablet 600 mg  600 mg Oral Q6H PRN Comer Locket, MD   600 mg at 02/17/21 2125   lidocaine (LIDODERM) 5 % 1 patch  1 patch Transdermal QPM [name], DO   1 patch at 02/17/21 2126   OLANZapine zydis (ZYPREXA) disintegrating tablet 5 mg  5 mg Oral Q8H PRN [name], DO       And   LORazepam (ATIVAN) tablet 1 mg  1 mg Oral PRN [name], DO       And   ziprasidone (GEODON) injection 20 mg  20 mg Intramuscular PRN [name], DO       magnesium hydroxide (MILK OF MAGNESIA) suspension 30 mL  30 mL Oral Daily PRN Laveda Abbe, NP   30 mL at 02/13/21 1515   melatonin tablet 3 mg  3 mg Oral QHS [name], DO   3 mg at 02/17/21 2123   OLANZapine (ZYPREXA) tablet 15 mg  15 mg Oral QHS [name], DO   15 mg at 02/17/21 2123   traZODone (DESYREL) tablet 100 mg  100 mg Oral QHS PRN Comer Locket, MD       valbenazine [facility]) capsule 40 mg  40 mg Oral QPM [name], DO   40 mg at 02/17/21 2121    PTA Medications: Medications Prior to Admission  Medication Sig Dispense Refill Last Dose   amphetamine-dextroamphetamine (ADDERALL) 5 MG tablet Take 10 mg by mouth every morning. (Patient not taking: Reported on 01/28/2021)      benztropine (COGENTIN) 1 MG tablet Take 1 tablet (1 mg total) by mouth 2 (two) times daily. (Patient not taking: Reported on 01/28/2021) 60 tablet 0    [START ON 02/24/2021] haloperidol decanoate (HALDOL DECANOATE) 100 MG/ML injection Inject 1 mL (100 mg total) into the muscle every 28 (twenty-eight) days. 1 mL 0     Patient Stressors: Financial difficulties    Patient Strengths: Ability for Contractor for treatment/growth   Treatment Modalities: Medication Management, Group therapy, Case management,  1 to 1 session with clinician, Psychoeducation, Recreational therapy.   Physician Treatment Plan for Primary Diagnosis: Schizoaffective disorder, bipolar type (HCC) Long Term Goal(s):     Short Term Goals:    Medication Management: Evaluate patient's response, side effects, and tolerance of medication regimen.  Therapeutic Interventions: 1 to 1 sessions, Unit Group sessions and Medication administration.  Evaluation of Outcomes: Progressing  Physician Treatment Plan  for Secondary Diagnosis: Principal Problem:   Schizoaffective disorder, bipolar type (HCC) Active Problems:   Homeless   Tardive dyskinesia  Long Term Goal(s):     Short Term Goals:       Medication Management: Evaluate patient's response, side effects, and tolerance of medication regimen.  Therapeutic Interventions: 1 to 1 sessions, Unit Group sessions and Medication administration.  Evaluation of Outcomes: Progressing   RN Treatment Plan for Primary Diagnosis: Schizoaffective disorder, bipolar type (HCC) Long Term Goal(s): Knowledge of disease and therapeutic regimen to maintain health will improve  Short Term Goals: Ability to demonstrate  self-control, Ability to participate in decision making will improve, and Ability to verbalize feelings will improve  Medication Management: RN will administer medications as ordered by provider, will assess and evaluate patient's response and provide education to patient for prescribed medication. RN will report any adverse and/or side effects to prescribing provider.  Therapeutic Interventions: 1 on 1 counseling sessions, Psychoeducation, Medication administration, Evaluate responses to treatment, Monitor vital signs and CBGs as ordered, Perform/monitor CIWA, COWS, AIMS and Fall Risk screenings as ordered, Perform wound care treatments as ordered.  Evaluation of Outcomes: Progressing   LCSW Treatment Plan for Primary Diagnosis: Schizoaffective disorder, bipolar type (HCC) Long Term Goal(s): Safe transition to appropriate next level of care at discharge, Engage patient in therapeutic group addressing interpersonal concerns.  Short Term Goals: Engage patient in aftercare planning with referrals and resources, Increase emotional regulation, Facilitate acceptance of mental health diagnosis and concerns, and Facilitate patient progression through stages of change regarding substance use diagnoses and concerns  Therapeutic Interventions: Assess for all discharge needs, 1 to 1 time with Social worker, Explore available resources and support systems, Assess for adequacy in community support network, Educate family and significant other(s) on suicide prevention, Complete Psychosocial Assessment, Interpersonal group therapy.  Evaluation of Outcomes: Progressing   Progress in Treatment: Attending groups: Yes. Participating in groups: Yes. Taking medication as prescribed: Yes. Toleration medication: Yes. Family/Significant other contact made: Yes, individual(s) contacted:  father Patient understands diagnosis: Yes. Discussing patient identified problems/goals with staff: Yes. Medical problems  stabilized or resolved: Yes. Denies suicidal/homicidal ideation: Yes. Issues/concerns per patient self-inventory: No. Other: None  New problem(s) identified: No, Describe:  None  New Short Term/Long Term Goal(s):medication stabilization, elimination of SI thoughts, development of comprehensive mental wellness plan.  Patient Goals: "work on my strength"  Discharge Plan or Barriers: Pt will f/u for medication management and therapy at [facility].   Reason for Continuation of Hospitalization: Medication stabilization  Estimated Length of Stay: 3-5 days   Scribe for Treatment Team: Chrys Racer 02/18/2021 2:15 PM

## 2021-02-19 LAB — HEAVY METALS, BLOOD
Arsenic: 2 ug/L (ref 0–9)
Lead: <1 ug/dL (ref 0.0–3.4)
Mercury: <1 ug/L (ref 0.0–14.9)

## 2021-02-19 NOTE — Progress Notes (Signed)
° ° °   02/18/21 2133  Psych Admission Type (Psych Patients Only)  Admission Status Involuntary  Psychosocial Assessment  Patient Complaints Anxiety  Eye Contact Brief  Facial Expression Flat  Affect Appropriate to circumstance  Speech Logical/coherent;Slow;Soft  Interaction Minimal;Cautious  Motor Activity Other (Comment) (wdl)  Appearance/Hygiene Unremarkable  Behavior Characteristics Cooperative  Mood Pleasant;Depressed  Thought Process  Coherency WDL  Content WDL  Delusions None reported or observed  Perception Hallucinations  Hallucination Auditory  Judgment Impaired  Confusion None  Danger to Self  Current suicidal ideation? Denies  Danger to Others  Danger to Others None reported or observed

## 2021-02-19 NOTE — Progress Notes (Signed)
°   02/19/21 2200  Psych Admission Type (Psych Patients Only)  Admission Status Involuntary  Psychosocial Assessment  Patient Complaints None  Eye Contact Brief  Facial Expression Flat  Affect Appropriate to circumstance  Speech Logical/coherent;Slow;Soft  Interaction Minimal;Cautious  Motor Activity Other (Comment) (wdl)  Appearance/Hygiene Unremarkable  Behavior Characteristics Cooperative  Mood Pleasant  Thought Process  Coherency WDL  Content WDL  Delusions None reported or observed  Perception Hallucinations  Hallucination Auditory  Judgment Impaired  Confusion None  Danger to Self  Current suicidal ideation? Denies  Danger to Others  Danger to Others None reported or observed

## 2021-02-19 NOTE — Progress Notes (Signed)
[facility] MD Progress Note  02/19/2021 4:47 PM Wesley Peterson  MRN: [id number]  CC: acute psychosis  Reason for Admission: Wesley Peterson is a 36 y.o. male with PPHx schizophrenia vs schizoaffective d/o who presented to [facility] ED for acute psychosis, agitation and concern for HI. He was then admitted involuntarily to [facility] for treatment of psychosis exacerbated by poor therapeutic compliance. [place] Day 17   Information Obtained Today During Patient Interview: Patient was evaluated in patient's room on [address]. Patient was cooperative and pleasant with interview. His thought process are linear, coherent, goal directed. He reported his AH are "stronger" today than yesterday.  The promptly reported how bored he was, as there are less groups today.  Also agreed that he possibly feels down because of the people he has met on the unit-discharged, reported that he has a phone number to contact later.  He reported improved sleep, mood, appetite.  Denied any medication side effects. Patient denied SI/HI/AH, delusions, paranoia, first rank symptoms, and contracted to safety on the unit. Patient was not grossly responding to internal/external stimuli nor made any delusional statements during encounter.   Diagnosis:  Principal Problem:   Schizoaffective disorder, bipolar type ([place]) Active Problems:   Homeless   Tardive dyskinesia  Past Psychiatric History: See H&P  Past Medical History:  Past Medical History:  Diagnosis Date   Schizophrenia ([place])    Schizophrenia, paranoid type ([place]) 02/02/2021   Family History: see H&P  Family Psychiatric  History: See H&P  Social History:  Social History   Substance and Sexual Activity  Alcohol Use Not Currently   Alcohol/week: 1.0 standard drink   Types: 1 Cans of beer per week   Comment: occasional     Social History   Substance and Sexual Activity  Drug Use Never    Social History   Socioeconomic History   Marital status: Single    Spouse  name: Not on file   Number of children: 1   Years of education: Not on file   Highest education level: Not on file  Occupational History   Occupation: Unemployed  Tobacco Use   Smoking status: Never   Smokeless tobacco: Never  Vaping Use   Vaping Use: Never used  Substance and Sexual Activity   Alcohol use: Not Currently    Alcohol/week: 1.0 standard drink    Types: 1 Cans of beer per week    Comment: occasional   Drug use: Never   Sexual activity: Never  Other Topics Concern   Not on file  Social History Narrative   ** Merged History Encounter **       Pt lives with mother and other relatives.  Currently unemployed.  [place] outpatient psychiatry services through [facility].   Social Determinants of Health   Financial Resource Strain: Not on file  Food Insecurity: Not on file  Transportation Needs: Not on file  Physical Activity: Not on file  Stress: Not on file  Social Connections: Not on file    Sleep: Improved  Appetite: Fair, improved  Current Medications: Current Facility-Administered Medications  Medication Dose Route Frequency Provider Last Rate Last Admin   acetaminophen (TYLENOL) tablet 650 mg  650 mg Oral Q6H PRN Ethelene Hal, NP   650 mg at 02/18/21 0924   alum & mag hydroxide-simeth (MAALOX/MYLANTA) 200-200-20 MG/5ML suspension 30 mL  30 mL Oral Q4H PRN Ethelene Hal, NP       benztropine (COGENTIN) tablet 1 mg  1 mg Oral  BID PRN Merrily Brittle, DO       FLUoxetine (PROZAC) capsule 20 mg  20 mg Oral Daily Merrily Brittle, DO   20 mg at 02/19/21 2355   hydrOXYzine (ATARAX) tablet 25 mg  25 mg Oral TID PRN Ethelene Hal, NP   25 mg at 02/18/21 2132   ibuprofen (ADVIL) tablet 600 mg  600 mg Oral Q6H PRN Harlow Asa, MD   600 mg at 02/19/21 1619   lidocaine (LIDODERM) 5 % 1 patch  1 patch Transdermal QPM Merrily Brittle, DO   1 patch at 02/18/21 1723   OLANZapine zydis (ZYPREXA) disintegrating tablet 5 mg  5 mg Oral Q8H PRN Merrily Brittle, DO       And   LORazepam (ATIVAN) tablet 1 mg  1 mg Oral PRN Merrily Brittle, DO       And   ziprasidone (GEODON) injection 20 mg  20 mg Intramuscular PRN Merrily Brittle, DO       magnesium hydroxide (MILK OF MAGNESIA) suspension 30 mL  30 mL Oral Daily PRN Ethelene Hal, NP   30 mL at 02/13/21 1515   melatonin tablet 3 mg  3 mg Oral QHS Merrily Brittle, DO   3 mg at 02/18/21 2133   OLANZapine (ZYPREXA) tablet 15 mg  15 mg Oral QHS Merrily Brittle, DO   15 mg at 02/18/21 2133   traZODone (DESYREL) tablet 100 mg  100 mg Oral QHS PRN Harlow Asa, MD       valbenazine [facility]) capsule 40 mg  40 mg Oral QPM Merrily Brittle, DO   40 mg at 02/18/21 2132    Lab Results:  Results for orders placed or performed during the hospital encounter of 02/02/21 (from the past 48 hour(s))  Heavy metals, blood     Status: None   Collection Time: 02/17/21  6:37 PM  Result Value Ref Range   Arsenic 2 0 - 9 ug/L    Comment: (NOTE) This test was developed and its performance characteristics determined by [facility]. It has not been cleared or approved by the Food and Drug Administration.                                Detection Limit = 1    Mercury <1.0 0.0 - 14.9 ug/L    Comment: (NOTE) This test was developed and its performance characteristics determined by [facility]. It has not been cleared or approved by the Food and Drug Administration.                        Environmental Exposure:  <15.0                        Occupational Exposure:                         BEI - Inorganic Mercury: 15.0                                Detection Limit =  1.0 Performed At: [facility] [place], [place] [id number] Rush Farmer MD HC:[id number]    Lead <1.0 0.0 - 3.4 ug/dL    Comment: (NOTE) Testing performed by [employer] Estate manager/land agent.  Environmental Exposure:                           WHO Recommendation    <20.0                           Occupational Exposure:                           OSHA Lead Std          40.0                           BEI                    30.0                                Detection Limit =  1.0 This test was developed and its performance characteristics determined by [facility]. It has not been cleared or approved by the Food and Drug Administration.   Ceruloplasmin     Status: None   Collection Time: 02/17/21  6:37 PM  Result Value Ref Range   Ceruloplasmin 21.1 16.0 - 31.0 mg/dL    Comment: (NOTE) Performed At: [facility] [place], [place] [id number] Rush Farmer MD BS:[id number]      Blood Alcohol level:  Lab Results  Component Value Date   [facility] <10 02/02/2021   ETH <10 76/54/6503    Metabolic Disorder Labs: Lab Results  Component Value Date   HGBA1C 6.0 (H) 02/03/2021   MPG 126 02/03/2021   MPG 114 02/02/2021   Lab Results  Component Value Date   PROLACTIN 23.4 (H) 11/28/2018   Lab Results  Component Value Date   CHOL 131 01/30/2021   TRIG 104 01/30/2021   HDL 30 (L) 01/30/2021   CHOLHDL 4.4 01/30/2021   VLDL 21 01/30/2021   LDLCALC 80 01/30/2021   LDLCALC 69 08/25/2019    Physical Findings:  Musculoskeletal: Strength & Muscle Tone: within normal limits Gait & Station: normal Patient leans: N/A  Psychiatric Specialty Exam: Physical Exam Vitals and nursing note reviewed.  Constitutional:      General: He is not in acute distress.    Appearance: He is not ill-appearing or diaphoretic.  HENT:     Head: Normocephalic.  Pulmonary:     Effort: Pulmonary effort is normal. No respiratory distress.  Neurological:     General: No focal deficit present.     Mental Status: He is alert.  Psychiatric:        Behavior: Behavior is cooperative.     Review of Systems  Respiratory:  Negative for shortness of breath.   Cardiovascular:  Negative for chest pain.  Gastrointestinal:  Negative for nausea and vomiting.  Neurological:  Negative for  dizziness and headaches.    Body mass index is 20.08 kg/m. Pulse Rate:  [81-106] 106 (02/11 1619) BP: (129-140)/(80-99) 140/99 (02/11 1619)  General Appearance:  Appropriate for Environment; Casual; Fairly Groomed    Eye Contact:   Fair    Speech:   Clear and Coherent; Normal Rate    Volume:   Normal    Mood:   Depressed (Mood is improved when in group or interacting with other people)    Affect:  Congruent; Constricted    Thought Process:  Coherent; Goal Directed; Linear  Descriptions of Associations: Intact  Duration of Psychotic Symptoms:  Greater than six months  Past Diagnosis of Schizophrenia or Psychoactive disorder:  Yes   Orientation:   Full (Time, Place and Person)   Thought Content:   Delusions; Paranoid Ideation (Ruminated on how bored yesterday, as there is not as many groups.  Reported "stronger AH"-mood congruent)  Hallucinations: Auditory Mood congruent-"that's his"  Ideas of Reference: None   Suicidal Thoughts:   No   Homicidal Thoughts:   No    Memory:   Immediate Good; Recent Good; Remote Fair    Judgement:   Good   Insight:   Shallow    Psychomotor Activity:   Normal -- (None noted during evaluation) No    Concentration:   Good    Attention Span:   Good   Recall:   Good    Fund of Knowledge:   Good    Language:   Good    Handed:   Right    Assets:   Communication Skills; Desire for Improvement; Leisure Time; Physical Health; Resilience; Social Support    ADL's:  Intact  Sleep:   Good  Number of Hours: 4.25  AIMS: Facial and Oral Movements Muscles of Facial Expression: None, normal Lips and Perioral Area: None, normal Jaw: None, normal Tongue: None, normal,Extremity Movements Upper (arms, wrists, hands, fingers): None, normal Lower (legs, knees, ankles, toes): None, normal, Trunk Movements Neck, shoulders, hips: None, normal, Overall Severity Severity of abnormal movements (highest score from  questions above): None, normal Incapacitation due to abnormal movements: None, normal Patient's awareness of abnormal movements (rate only patient's report): No Awareness, Dental Status Current problems with teeth and/or dentures?: No Does patient usually wear dentures?: No   CIWA:   COWS:        Physical Exam: Vitals:   02/17/21 1623 02/18/21 1621 02/18/21 2015 02/19/21 1619  BP: 127/86 (!) 129/101 129/80 (!) 140/99  Pulse: (!) 102 (!) 107 81 (!) 106  Temp:      Resp:      Height:      Weight:      SpO2: 97%     TempSrc:      BMI (Calculated):         Treatment Plan & Assessment Summary: Principal Problem:   Schizoaffective disorder, bipolar type ([place]) Active Problems:   Homeless   Tardive dyskinesia  PLAN: Schizoaffective d/o bipolar type (r/o schizophrenia), S/p Haldol Decanoate 100 mg IM (01/29/2021) QTc 456m on 02/03/21 and 3927mon 02/09/21, A1c 6.0, Lipids WNL other than HDL 30. Patient's affect is brighter, and thought process were linear, coherent, and goal directed.  He still ruminates on his delusions per above and experienced AH during interview. The AH appear to be mood congruent and stable. Patient is still able to participate in meals, group and is actively integrating in the milieu. The paranoid and persecutory delusions are possibly fixed.  Continued zyprexa 15 mg qHS Holding stimulant and advised not to restart Adderall  Continued Melatonin 28m26mhs  Tardive Dyskinesia and EPS Minimal lateral jaw movement, very subtle and emerges only during distraction, patient noticed when asked, but is unbothered.  Continued to monitor with AIMS  Continued Cogentin 1 mg TID PRN Continued Ingrezza 40 mg nightly  Prediabetes A1c 6.0 Recommended follow-up with PCP after discharge Continued to encourage lifestyle modification Will consider metformin if patient is amenable, however unsure of med adherence after  dc given hx and disease  Constipation Continued Colace  schedule 151m qd  Encouraged MOM and increased fluid and fiber  Hep B SAg (+) Consistent with Hep B vaccination immunity status. [facility] however [facility]B labs were negative on 07/27/20. Cannot rule out past infection of HBV that he has recovered from. Given HepB Core Ab and HepB S Ag are NR (2/1), patient does not have active HepB infection. Hep B DNA PCR showed no HBV DNA detected. Acute hepatitis panel also negative on 02/09/21 for Hep C Ab and Hep A IgM  Dispo: Patient, No Pcp Per (Inactive)  Dispo: homeless shelter (Patient was living with father prior to admission) Pending clinical improvement IVC'd by father VMichal [phone] have patient f/u with medication assistance program for Ingrezza  Patient's next Haldol Decanoate 100 mg IM injection is due on 02/24/2021  Safety, monitoring and disposition planning: The patient was seen and evaluated on the unit.  The patient's chart was reviewed and nursing notes were reviewed.  The patient's case was discussed in multidisciplinary team meeting.  Plan and drug side effects were discussed with patient who was amendable. Social work and case management to assist with discharge planning and identification of hospital follow-up needs prior to discharge. Discharge Concerns: Need to establish a safety plan; Medication compliance and effectiveness Discharge Goals: Return home with outpatient referrals for mental health follow-up including medication management/psychotherapy Safety and Monitoring: Involuntary status at inpatient psychiatric unit for safety, stabilization and treatment Daily contact with patient to assess and evaluate symptoms and progress in treatment and medical management Patient's case to be discussed in multi-disciplinary team meeting Observation Level: Per above Vital signs:   q12 hours Precautions: suicide   Signed: JMerrily Brittle DO Psychiatry Resident, PGY-1 [facility]2/11/2021, 4:47 PM

## 2021-02-19 NOTE — Group Note (Signed)
LCSW Group Therapy Note  02/19/2021    10:00-11:00am   Topic:  Anger Healthy and Unhealthy Coping Skills  Participation Level:  Minimal  Description of Group:   In this group, patients identified their own common triggers and typical reactions then analyzed how these reactions are possibly beneficial and possibly unhelpful.  Focus was placed on examining whether typical coping skills are healthy or unhealthy.  Therapeutic Goals: Patients will share situations that commonly incite their anger and how they typically respond Patients will identify how their coping skills work for them and/or against them Patients will explore possible alternative coping skills Patients will learn that anger itself is normal and that healthier reactions can assist with resolving conflict rather than worsening situations  Summary of Patient Progress:  The patient shared that his frequent cause of anger is arguments with family or friends.  He stated that he has often lost relationships.  He was completely silent for the remainder of group.  Therapeutic Modalities:   Cognitive Behavioral Therapy Processing  Maretta Los

## 2021-02-19 NOTE — Plan of Care (Signed)
°  Problem: Education: °Goal: Emotional status will improve °Outcome: Progressing °Goal: Mental status will improve °Outcome: Progressing °Goal: Verbalization of understanding the information provided will improve °Outcome: Progressing °  °

## 2021-02-19 NOTE — Progress Notes (Signed)
Patient attend wrap up group.Adult Psychoeducational Group Note  Date:  02/19/2021 Time:  10:52 PM  Group Topic/Focus:  Wrap-Up Group:   The focus of this group is to help patients review their daily goal of treatment and discuss progress on daily workbooks.  Participation Level:  Active  Participation Quality:  Appropriate  Affect:  Appropriate  Cognitive:  Appropriate  Insight: Appropriate  Engagement in Group:  Distracting  Modes of Intervention:  Discussion  Additional Comments:    Barbette Hair 02/19/2021, 10:52 PM

## 2021-02-20 NOTE — Progress Notes (Signed)
[facility] MD Progress Note  02/20/2021 9:54 AM Wesley Peterson  MRN:  [id number]  Subjective: Wesley Peterson reports, "I'm doing a little better today. The voices are still there, saying to me, his, hers & ours. I'm trying to ignore them". Daily notes 02-20-21: Wesley Peterson is seen, chart reviewed. The chart findings discussed with the treatment team. He is seen in his room. Says was getting ready to go to the group sessions. He presents alert, oriented & aware of situation. He is visible on the unit, taking his medications. He denies any side effects. He says is medications are helping, however, continues to report auditory hallucinations (see subjective data above). But from his outlook & presentation, Wesley Peterson presents more improved than when first admitted. He is making a good eye contact. He is coming out of his room more often now, participating in the group sessions than when first admitted. Although continues to endorse auditory hallucinations, he denies any VH, delusional thoughts or paranoia. He does not appear to be responding to any internal stimuli. He remains on his current plan of care as already in progress. No changes made. The vital signs are reviewed, stable. Lab reviewed, no new lab results or changes.  Reason for admission:  Wesley Peterson is a 36 y.o. male with PPHx schizophrenia, HI, who presented to [facility] ED for acute psychosis, agitation and concern for HI then admitted involuntarily to [facility] for treatment of schizophrenia exacerbated by poor therapeutic alliance. Wesley Peterson 3   Principal Problem: Schizoaffective disorder, bipolar type ([place])  Diagnosis: Principal Problem:   Schizoaffective disorder, bipolar type ([place]) Active Problems:   Homeless   Tardive dyskinesia  Total Time spent with patient:  25 minutes  Past Psychiatric History: Schizoaffective disorder, bipolar-type.  Past Medical History:  Past Medical History:  Diagnosis Date   Schizophrenia ([place])    Schizophrenia, paranoid type  ([place]) 02/02/2021   History reviewed. No pertinent surgical history.  Family History: History reviewed. No pertinent family history.  Family Psychiatric  History: See H&P.  Social History:  Social History   Substance and Sexual Activity  Alcohol Use Not Currently   Alcohol/week: 1.0 standard drink   Types: 1 Cans of beer per week   Comment: occasional     Social History   Substance and Sexual Activity  Drug Use Never    Social History   Socioeconomic History   Marital status: Single    Spouse name: Not on file   Number of children: 1   Years of education: Not on file   Highest education level: Not on file  Occupational History   Occupation: Unemployed  Tobacco Use   Smoking status: Never   Smokeless tobacco: Never  Vaping Use   Vaping Use: Never used  Substance and Sexual Activity   Alcohol use: Not Currently    Alcohol/week: 1.0 standard drink    Types: 1 Cans of beer per week    Comment: occasional   Drug use: Never   Sexual activity: Never  Other Topics Concern   Not on file  Social History Narrative   ** Merged History Encounter **       Pt lives with mother and other relatives.  Currently unemployed.  [place] outpatient psychiatry services through [facility].   Social Determinants of Health   Financial Resource Strain: Not on file  Food Insecurity: Not on file  Transportation Needs: Not on file  Physical Activity: Not on file  Stress: Not on file  Social Connections: Not on  file   Additional Social History:   Sleep: Good  Appetite:  Good  Current Medications: Current Facility-Administered Medications  Medication Dose Route Frequency Provider Last Rate Last Admin   acetaminophen (TYLENOL) tablet 650 mg  650 mg Oral Q6H PRN Ethelene Hal, NP   650 mg at 02/18/21 0924   alum & mag hydroxide-simeth (MAALOX/MYLANTA) 200-200-20 MG/5ML suspension 30 mL  30 mL Oral Q4H PRN Ethelene Hal, NP       benztropine (COGENTIN) tablet 1 mg  1 mg  Oral BID PRN Merrily Brittle, DO       FLUoxetine (PROZAC) capsule 20 mg  20 mg Oral Daily Merrily Brittle, DO   20 mg at 02/20/21 0900   hydrOXYzine (ATARAX) tablet 25 mg  25 mg Oral TID PRN Ethelene Hal, NP   25 mg at 02/18/21 2132   ibuprofen (ADVIL) tablet 600 mg  600 mg Oral Q6H PRN Harlow Asa, MD   600 mg at 02/19/21 1619   lidocaine (LIDODERM) 5 % 1 patch  1 patch Transdermal QPM Merrily Brittle, DO   1 patch at 02/19/21 1810   OLANZapine zydis (ZYPREXA) disintegrating tablet 5 mg  5 mg Oral Q8H PRN Merrily Brittle, DO       And   LORazepam (ATIVAN) tablet 1 mg  1 mg Oral PRN Merrily Brittle, DO       And   ziprasidone (GEODON) injection 20 mg  20 mg Intramuscular PRN Merrily Brittle, DO       magnesium hydroxide (MILK OF MAGNESIA) suspension 30 mL  30 mL Oral Daily PRN Ethelene Hal, NP   30 mL at 02/13/21 1515   melatonin tablet 3 mg  3 mg Oral QHS Merrily Brittle, DO   3 mg at 02/19/21 2053   OLANZapine (ZYPREXA) tablet 15 mg  15 mg Oral QHS Merrily Brittle, DO   15 mg at 02/19/21 2053   traZODone (DESYREL) tablet 100 mg  100 mg Oral QHS PRN Harlow Asa, MD       valbenazine [facility]) capsule 40 mg  40 mg Oral QPM Merrily Brittle, DO   40 mg at 02/19/21 2053   Lab Results: No results found for this or any previous visit (from the past 48 hour(s)).  Blood Alcohol level:  Lab Results  Component Value Date   ETH <10 02/02/2021   ETH <10 [id number]   Metabolic Disorder Labs: Lab Results  Component Value Date   HGBA1C 6.0 (H) 02/03/2021   MPG 126 02/03/2021   MPG 114 02/02/2021   Lab Results  Component Value Date   PROLACTIN 23.4 (H) 11/28/2018   Lab Results  Component Value Date   CHOL 131 01/30/2021   TRIG 104 01/30/2021   HDL 30 (L) 01/30/2021   CHOLHDL 4.4 01/30/2021   VLDL 21 01/30/2021   LDLCALC 80 01/30/2021   LDLCALC 69 08/25/2019   Physical Findings: AIMS: Facial and Oral Movements Muscles of Facial Expression: None, normal Lips and Perioral  Area: None, normal Jaw: None, normal Tongue: None, normal,Extremity Movements Upper (arms, wrists, hands, fingers): None, normal Lower (legs, knees, ankles, toes): None, normal, Trunk Movements Neck, shoulders, hips: None, normal, Overall Severity Severity of abnormal movements (highest score from questions above): None, normal Incapacitation due to abnormal movements: None, normal Patient's awareness of abnormal movements (rate only patient's report): No Awareness, Dental Status Current problems with teeth and/or dentures?: No Does patient usually wear dentures?: No  CIWA:    COWS:  Musculoskeletal: Strength & Muscle Tone: within normal limits Gait & Station: normal Patient leans: N/A  Psychiatric Specialty Exam:  Presentation  General Appearance: Appropriate for Environment; Casual; Fairly Groomed  Eye Contact:Fair  Speech:Clear and Coherent; Normal Rate  Speech Volume:Normal  Handedness:Right  Mood and Affect  Mood:Depressed (Mood is improved when in group or interacting with other people)  Affect:Congruent; Constricted  Thought Process  Thought Processes:Coherent; Goal Directed; Linear  Descriptions of Associations:Intact  Orientation:Full (Time, Place and Person)  Thought Content:Delusions; Paranoid Ideation (Ruminated on how bored yesterday, as there is not as many groups.  Reported "stronger AH"-mood congruent)  History of Schizophrenia/Schizoaffective disorder:Yes  Duration of Psychotic Symptoms:Greater than six months  Hallucinations:Hallucinations: Auditory Description of Auditory Hallucinations: Mood congruent-"that's his"  Ideas of Reference:None  Suicidal Thoughts:Suicidal Thoughts: No  Homicidal Thoughts:Homicidal Thoughts: No  Sensorium  Memory:Immediate Good; Recent Good; Remote Fair  Judgment:Good  Insight:Shallow  Executive Functions  Concentration:Good  Attention Span:Good  Railroad of  Knowledge:Good  Language:Good  Psychomotor Activity  Psychomotor Activity:Psychomotor Activity: Normal Extrapyramidal Side Effects (EPS): -- (None noted during evaluation) AIMS Completed?: No  Assets  Assets:Communication Skills; Desire for Improvement; Leisure Time; Physical Health; Resilience; Social Support  Sleep  Sleep:Sleep: Good  Physical Exam: Physical Exam Vitals and nursing note reviewed.  HENT:     Nose: Nose normal.  Eyes:     Pupils: Pupils are equal, round, and reactive to light.  Genitourinary:    Comments: Deferred Musculoskeletal:        General: Normal range of motion.     Cervical back: Normal range of motion.  Skin:    General: Skin is warm and dry.  Neurological:     General: No focal deficit present.     Mental Status: He is alert and oriented to person, place, and time.   Review of Systems  Constitutional:  Negative for chills, diaphoresis and fever.  HENT:  Negative for congestion and sore throat.   Eyes:  Negative for blurred vision.  Respiratory:  Negative for cough, shortness of breath and wheezing.   Cardiovascular:  Negative for chest pain and palpitations.  Gastrointestinal:  Negative for abdominal pain, diarrhea, heartburn, nausea and vomiting.  Genitourinary:  Negative for dysuria.  Musculoskeletal:  Positive for myalgias.  Neurological:  Negative for dizziness, tingling, tremors, sensory change, speech change, focal weakness, seizures, loss of consciousness, weakness and headaches.  Endo/Heme/Allergies:        Allergies: NKDA  Psychiatric/Behavioral:  Positive for depression, hallucinations ("Improving") and substance abuse (UDS (+) for Amphetamine). Negative for memory loss and suicidal ideas. The patient is not nervous/anxious and does not have insomnia.   Blood pressure (!) 140/99, pulse (!) 106, temperature 97.7 F (36.5 C), temperature source Oral, resp. rate 18, height 6' 1.2" (1.859 m), weight 69.4 kg, SpO2 97 %. Body mass index  is 20.08 kg/m.  Treatment Plan Summary: Daily contact with patient to assess and evaluate symptoms and progress in treatment and Medication management.   Continue inpatient hospitalization.  Will continue today 02/20/2021 plan as below except where it is noted.   Diagnoses: Schizoaffective disorder, bipolar-type. Major depressive disorder.  Plan: -Continue Olanzapine 15 mg po Q bedtime for Psychosis. -Continue Fluoxetine 20 mg po daily for depression. -Continue Melatonin 3 mg po Q bedtime for insomnia. -Ingrezza 40 mg po Q evenings for tardive dyskinesia.   Other prn medication, continue:  -Continue Cogentin 1 mg po prn for EPS. -Hydroxyzine 25 mg po tid prn for anxiety. -Trazodone  100 mg po Q hs prn for insomnia. -Acetaminophen 650 mg po Q 6 hrs prn for pain/fever. -Ibuprofen 400 mg po Q 6 hs prn for pain. -MOM 30 ml po Q daily prn for constipation.  -The psychosis/agitation protocols as recommended prn.  Other medical issues:  -Continue Lidocaine patch transdermally Q 12 hrs at (1800) for pain.  -Encourage group participation. -Discharge disposition plan in progress.   Lindell Spar, NP, pmhnp, fnp-bc 02/20/2021, 9:54 AM

## 2021-02-20 NOTE — Group Note (Signed)
[facility] LCSW Group Therapy Note  02/20/2021  10:00-11:00am  Type of Therapy and Topic:  Group Therapy:  A Hero Worthy of Support  Participation Level:  Minimal   Description of Group:  Patients in this group were introduced to the concept that additional supports including self-support are an essential part of recovery.  Matching needs with supports to help fulfill those needs was explained.  Establishing boundaries that can gradually be increased or decreased was described, with patients giving their own examples of establishing appropriate boundaries in their lives.  A song entitled "My Own Hero" was played and a group discussion ensued in which patients stated it inspired them to help themselves in order to succeed, because other people cannot achieve their goals such as sobriety or stability for them.  A song was played called "Love Me More" which led to a discussion about being willing to believe we are worth the effort of being a self-support.   Therapeutic Goals: 1)  demonstrate the importance of being a key part of one's own support system 2)  discuss various available supports 3)  show how peer supports can be a valuable tool in recovery 4)  elicit ideas from patients about supports that need to be added   Summary of Patient Progress:  The patient was not present at the beginning of group so did not have an opportunity to share his current supports.  The patient expressed a desire and willingness to add his father after discharge to help in recovery.  The patient's participation during the short time he was in the room was considerable more than it has ever been previously..  Therapeutic Modalities:   Motivational Interviewing Activity  Lynnell Chad

## 2021-02-20 NOTE — Progress Notes (Signed)
D- Patient alert and oriented x4. Patient in stable mood.  Denies SI, HI, VH. States he still has auditory hallucinations. Pt interacting positively with staff and peers. Pt verbalized excitement for discharge tomorrow.   A- Scheduled medications administered to patient, per MD orders.Routine safety checks conducted every 15 minutes.  Patient informed to notify staff with problems or concerns.   R- Patient compliant with medications and verbalized understanding treatment plan. Patient receptive, calm, and cooperative.     02/20/21 2020  Psych Admission Type (Psych Patients Only)  Admission Status Involuntary  Psychosocial Assessment  Patient Complaints None  Eye Contact Brief  Facial Expression Fixed smile  Affect Appropriate to circumstance  Speech Logical/coherent;Slow;Soft  Interaction Cautious  Motor Activity Other (Comment) (wdl)  Appearance/Hygiene Unremarkable  Behavior Characteristics Appropriate to situation;Cooperative  Mood Pleasant  Thought Process  Coherency WDL  Content WDL  Delusions None reported or observed  Perception Hallucinations  Hallucination Auditory  Judgment Impaired  Confusion None  Danger to Self  Current suicidal ideation? Denies  Danger to Others  Danger to Others None reported or observed

## 2021-02-20 NOTE — Plan of Care (Signed)
Nurse discussed coping skills with patient.  

## 2021-02-20 NOTE — [facility] Group Notes (Signed)
Spiritual care group on grief and loss facilitated by chaplain Dyanne Carrel, [facility]   Group Goal:   Support / Education around grief and loss   Members engage in facilitated group support and psycho-social education.   Group Description:   Following introductions and group rules, group members engaged in facilitated group dialog and support around topic of loss, with particular support around experiences of loss in their lives. Group Identified types of loss (relationships / self / things) and identified patterns, circumstances, and changes that precipitate losses. Reflected on thoughts / feelings around loss, normalized grief responses, and recognized variety in grief experience. Group noted Worden's four tasks of grief in discussion.   Group drew on Adlerian / Rogerian, narrative, MI,   Patient Progress: Wesley Peterson attended group and was engaged in conversation for the entirety of the session.  [address] [facility] Pager, [phone]

## 2021-02-20 NOTE — [facility] Group Notes (Signed)
Psychoeducational Group Note  Date:  02/20/2021 Time:  1300-1400   Group Topic/Focus: This is a continuation of the group from Saturday. Pt's have been asked to formulate a list of 30 positives about themselves. This list is to be read 2 times a day for 30 days, looking in a mirror. Changing patterns of negative self talk. Also discussed is the fact that there have been some people who hurt [place] in the past. We keep that memory alive within [place]. Ways to cope with this are discused   Participation Level:  Active  Participation Quality:  Appropriate  Affect:  Appropriate  Cognitive:  Oriented  Insight: Improving  Engagement in Group:  Engaged  Modes of Intervention:  Activity, Discussion, Education, and Support  Additional Comments:  Pt rates his energy at a 9/10. Answered questions but did not volunteer anything. Listened intently.  Dione Housekeeper

## 2021-02-20 NOTE — [facility] Group Notes (Signed)
Adult Psychoeducational Group Not °Date:  02/20/2021 °Time:  0900-1045 °Group Topic/Focus: PROGRESSIVE RELAXATION. A group where deep breathing is taught and tensing and relaxation muscle groups is used. Imagery is used as well.  Pts are asked to imagine 3 pillars that hold them up when they are not able to hold themselves up. ° °Participation Level:  did not attend ° °Shamecka Hocutt A ° ° °

## 2021-02-20 NOTE — Progress Notes (Addendum)
D:  Patient's self inventory sheet, patient sleeps good, no sleep medication.  Good appetite, low energy level, poor concentration.  Rated depression and anxiety 8, hopeless 7.  Denied withdrawals.  Denied SI.  Physical problems, pain, worst pain #8 in past 24 hours.  Goal today is  call his family.  No discharge plans. A:  Medications administered per MD orders.  Emotional support and encouragement given patient. R:  Patient stated he does hear voices, "he, her, them".  Denied SI and HI, contracts for  safety.  Denied visual hallucinations.

## 2021-02-21 MED ORDER — MELATONIN 3 MG PO TABS: 3.0000 mg | ORAL_TABLET | Freq: Every day | ORAL | 0 refills | Status: DC

## 2021-02-21 MED ORDER — BENZTROPINE MESYLATE 1 MG PO TABS: 1.0000 mg | ORAL_TABLET | Freq: Two times a day (BID) | ORAL | 0 refills | Status: DC | PRN

## 2021-02-21 MED ORDER — VALBENAZINE TOSYLATE 40 MG PO CAPS: 40.0000 mg | ORAL_CAPSULE | Freq: Every evening | ORAL | 0 refills | Status: AC

## 2021-02-21 MED ORDER — FLUOXETINE HCL 20 MG PO CAPS: 20.0000 mg | ORAL_CAPSULE | Freq: Every day | ORAL | 0 refills | Status: DC

## 2021-02-21 MED ORDER — HALOPERIDOL DECANOATE 100 MG/ML IM SOLN
100.0000 mg | INTRAMUSCULAR | 0 refills | Status: DC
Start: 2021-02-24 — End: 2021-06-10

## 2021-02-21 MED ORDER — OLANZAPINE 15 MG PO TABS: 15.0000 mg | ORAL_TABLET | Freq: Every day | ORAL | 0 refills | Status: DC

## 2021-02-21 NOTE — Progress Notes (Signed)
°  [facility] Adult Case Management Discharge Plan :  Will you be returning to the same living situation after discharge:  No. Will be discharged to shelter At discharge, do you have transportation home?: No. Transportation to be arranged by CSW Do you have the ability to pay for your medications: No. Samples to be provided at discharge  Release of information consent forms completed and in the chart;  Patient's signature needed at discharge.  Patient to Follow up at:  Follow-up Information     Services, [facility]. Go on 02/23/2021.   Why: You have a hospital follow up appointment for therapy and medication management services on 02/23/21 at 10:00 am.  This appointment will be held in person. Contact information: [place] [phone]                 Next level of care provider has access to [place] and Suicide Prevention discussed: Yes,  with father     Has patient been referred to the Quitline?: Patient refused referral  Patient has been referred for addiction treatment: N/A  Vassie Moselle, LCSW 02/21/2021, 10:12 AM

## 2021-02-21 NOTE — Discharge Summary (Signed)
Physician Discharge Summary Note  Patient:  Wesley Peterson is an 36 y.o., male MRN: [id number] DOB: Oct 08, 1985 Patient phone: [phone] (home)  Patient address:   [address] [place] [address]   Total Time spent with patient:  I personally spent 30 minutes on the unit in direct patient care. The direct patient care time included face-to-face time with the patient, reviewing the patient's chart, communicating with other professionals, and coordinating care. Greater than 50% of this time was spent in counseling or coordinating care with the patient regarding goals of hospitalization, psycho-education, and discharge planning needs.   Date of Admission: 02/02/2021 Date of Discharge: 02/21/2021  Reason for Admission:   Acute psychosis  Per H&P: " Wesley Peterson is a 36 y.o. male with PPHx of  schizophrenia, HI, who presented to [facility] ED for acute psychosis, agitation and concern for HI then admitted involuntarily to [facility] for treatment of schizophrenia exacerbated by poor therapeutic alliance.   Mode of transport to Hospital: [facility] Current Medication List: Haldol Dec,haldol 5 mg daily, cogentin 1 mg daily, depakote 250 BID PRN medication prior to evaluation: None   ED course:  Patient was medically cleared by [facility] ED, patient received Haldol decanoate IM per below.   HPI:  Patient stated that he was admitted to [facility] because his dad called the police on him, and he was brought here.  Said there is a TEFL teacher, that he does not remember why.  Patient stated that he is currently having AH.  Patient is currently psychotic, and history was limited. Per father, [employer] was called after becoming agitated with concern for HI at his biological father and stepmother at home, after about 1 week of worsening psychosis. Patient was discharged from [facility] inpatient psychiatric hospital after about 2 weeks of stay.  Father stated that the patient received a shot, was taking his medicines  during hospitalization.  Stated "I do not know if the shot wore off, but he was not sleeping and he was pacing and he was talking to himself, until he got really agitated".    Patient denied symptoms of MDD including continuous depressed mood and pervasive sadness, anhedonia, insomnia/hypersomnia, guilt, hopelessness, decreased energy, decreased concentration, forgetfulness, decreased/increased appetite, psychomotor slowing, and suicidal ideations or intentions for >2 weeks.    Patient denied symptoms of generalized anxiety including having difficulty controlling/managing anxiety, that it is out of proportion with stressors, and that it caused feelings of restlessness or being on edge, easily fatigued, concentration difficulty, irritability, muscle tension, and sleep disturbance. Patient denied having difficulty controlling worry.    Currently, patient denied SI/HI, and endorsed AH of voices of people he has met before, saying "he and she".   Unable to obtain given patient's psychosis. Past Psychiatric History: Past psych diagnoses: Schizophrenia, cannabis abuse Prior inpatient treatment: [facility] (12/2021), unclear if others given patient has been traveling to different states Suicide attempts: Denied Psychiatric med trials:  Neuromodulation history:  Current outpatient psychiatrist:  Current outpatient therapist:  History of selective adherence: Yes.      Family Psychiatric History: Completed/attempted suicide:  Bipolar spectrum disorder:  Schizophrenia spectrum disorder: Mom and maternal uncle Substance abuse: Multiple family members, crack cocaine, cannabis   Substance Abuse History: Alcohol:  Nicotine:  Illicit drugs:  Rx drug abuse:  Rehab hx:  Seizure hx:    Social History:  Childhood:  Abuse:  Marital Status: Denied Children: Denied Income: None, mom has since applied for disabilities Peer Group:  Housing:  Housing stability Legal: [employer] time in [place],  [place] for fighting with golf clubs.  Multiple charges such as trespassing, currently has charges for trespassing Military:  "  Discharge Diagnoses:  Principal Problem:   Schizoaffective disorder, bipolar type ([place]) Active Problems:   Homeless   Tardive dyskinesia   Past Medical History:  Past Medical History:  Diagnosis Date   Schizophrenia ([place])    Schizophrenia, paranoid type ([place]) 02/02/2021    History reviewed. No pertinent surgical history.  Family History:  History reviewed. No pertinent family history.  Social History:  Social History   Substance and Sexual Activity  Alcohol Use Not Currently   Alcohol/week: 1.0 standard drink   Types: 1 Cans of beer per week   Comment: occasional     Social History   Substance and Sexual Activity  Drug Use Never    Social History   Socioeconomic History   Marital status: Single    Spouse name: Not on file   Number of children: 1   Years of education: Not on file   Highest education level: Not on file  Occupational History   Occupation: Unemployed  Tobacco Use   Smoking status: Never   Smokeless tobacco: Never  Vaping Use   Vaping Use: Never used  Substance and Sexual Activity   Alcohol use: Not Currently    Alcohol/week: 1.0 standard drink    Types: 1 Cans of beer per week    Comment: occasional   Drug use: Never   Sexual activity: Never  Other Topics Concern   Not on file  Social History Narrative   ** Merged History Encounter **       Pt lives with mother and other relatives.  Currently unemployed.  [place] outpatient psychiatry services through [facility].   Social Determinants of Health   Financial Resource Strain: Not on file  Food Insecurity: Not on file  Transportation Needs: Not on file  Physical Activity: Not on file  Stress: Not on file  Social Connections: Not on file    HOSPITAL COURSE: Wesley Peterson is a 36 y.o. male with PPHx schizoaffective d/o who presented to [facility] ED for acute  psychosis. He was then admitted involuntarily to [facility] for treatment of psychosis exacerbated by poor therapeutic compliance and stimulant use.  During the patient's hospitalization, patient had extensive initial psychiatric evaluation, and follow-up psychiatric evaluations every day.  Psychiatric diagnoses provided upon initial assessment:  Schizoaffective disorder, bipolar type  During hospitalization, the diagnosis list was revised to reflect:  Schizoaffective disorder, bipolar type - currently in depressive episode with psychotic features (mood congruent AH) Tardive dyskinesia  Patient's psychiatric medications were adjusted on admission:  Restarted home Cogentin 1 mg BID for EPS Haldol Decanoate 100 mg IM (01/29/2021)-received prior to admission Restarted home Haldol 5 mg p.o. daily  During the hospitalization, other adjustments were made to the patient's psychiatric medication regimen:  Started Abilify, titrated up to 20 and tapered off due to ineffectiveness Started Ingrezza 40 mg for tardive dyskinesia-lateral motion of lower jaw on distraction Started and titrated Zyprexa 15 mg nightly for residual psychosis and delusions Changed Cogentin to TID PRN Discontinued Haldol for tardive dyskinesia Started and up titrated Prozac to 20 mg daily for MDD with psychotic features  Patient's next Haldol Decanoate 100 mg IM injection is due on 02/24/2021   Patient's care was discussed during the interdisciplinary team meeting every day during the hospitalization.  The patient had muscle stiffness (acute dystonia) and involuntary movements (  tardive dyskinesia) side effects to prescribed psychiatric medication that is resolved or managed with medications (cogentin for muscle stiffness as needed and ingrezza for involuntary movements).   Gradually, patient started adjusting to milieu. The patient was evaluated each day by a clinical provider to ascertain response to treatment.  Improvement was noted by the patient's report of decreasing symptoms, improved sleep and appetite, affect, medication tolerance, behavior, and participation in unit programming.  Patient was asked each day to complete a self inventory noting mood, mental status, pain, new symptoms, anxiety and concerns.   Symptoms were reported as significantly decreased or resolved completely by discharge.  The patient reports that their mood is stable.  The patient denied having suicidal thoughts for more than 48 hours prior to discharge.  Patient denies having homicidal thoughts.  Patient denies having auditory hallucinations.  Patient denies any visual hallucinations or other symptoms of psychosis.  The patient was motivated to continue taking medication with a goal of continued improvement in mental health.   The patient reports their target psychiatric symptoms of hypersomnia, decreased concentration, decreased attention, anxiety, auditory hallucinations, depression, paranoia responded well to the psychiatric medications, and the patient reports overall benefit other psychiatric hospitalization. Supportive psychotherapy was provided to the patient. The patient also participated in regular group therapy while hospitalized. Coping skills, problem solving as well as relaxation therapies were also part of the unit programming.  Labs were reviewed with the patient, and abnormal results were discussed with the patient.  The patient is able to verbalize their individual safety plan to this provider.  # It is recommended to the patient to continue psychiatric medications as prescribed, after discharge from the hospital.    # It is recommended to the patient to follow up with your outpatient psychiatric provider and PCP.  # It was discussed with the patient, the impact of alcohol, drugs, tobacco have been there overall psychiatric and medical wellbeing, and total abstinence from substance use was recommended the  patient.ed.  # Prescriptions provided or sent directly to preferred pharmacy at discharge. Patient agreeable to plan. Given opportunity to ask questions. Appears to feel comfortable with discharge.    # In the event of worsening symptoms, the patient is instructed to call the crisis hotline, 911 and or go to the nearest ED for appropriate evaluation and treatment of symptoms. To follow-up with primary care provider for other medical issues, concerns and or health care needs  # Patient was discharged shelter with a plan to follow up as noted below.  Physical Findings: Musculoskeletal: Strength & Muscle Tone: within normal limits Gait & Station: normal Patient leans: N/A   Psychiatric Specialty Exam: Physical Exam Vitals and nursing note reviewed.  Constitutional:      General: He is not in acute distress.    Appearance: He is not ill-appearing or diaphoretic.  HENT:     Head: Normocephalic.  Pulmonary:     Effort: Pulmonary effort is normal. No respiratory distress.  Neurological:     General: No focal deficit present.     Mental Status: He is alert and oriented to person, place, and time.     Cranial Nerves: No cranial nerve deficit.     Coordination: Coordination normal.     Gait: Gait normal.  Psychiatric:        Behavior: Behavior is cooperative.     Review of Systems  Respiratory:  Negative for shortness of breath.   Cardiovascular:  Negative for chest pain.  Gastrointestinal:  Negative  for nausea and vomiting.  Neurological:  Negative for dizziness and headaches.    Body mass index is 20.08 kg/m. Temp:  [98 F (36.7 C)-98.3 F (36.8 C)] 98 F (36.7 C) (02/13 0615) Pulse Rate:  [77-101] 101 (02/13 0617) Resp:  [18] 18 (02/13 0617) BP: (130-137)/(72-95) 137/95 (02/13 0617) SpO2:  [97 %-98 %] 98 % (02/13 0615)  General Appearance:  Appropriate for Environment; Casual; Fairly Groomed    Eye Contact:   Fair    Speech:   Clear and Coherent; Normal Rate  (Spontaneous)    Volume:   Normal    Mood:   Euthymic    Affect:   Appropriate; Congruent; Constricted (Affect brightens up intermittently during interview and appeared full range)    Thought Process:  Coherent; Goal Directed; Linear (Can be concrete at times when talking about his delusions, otherwise organized)  Descriptions of Associations: Intact  Duration of Psychotic Symptoms:  Greater than six months  Past Diagnosis of Schizophrenia or Psychoactive disorder:  Yes   Orientation:   Full (Time, Place and Person)   Thought Content:   Delusions; Logical (Fixed paranoid delusions about family that is unchaged. Future oriented. Denied thought insertion, thought broadcasting, magical thinking, ideas of reference.)  Hallucinations: Auditory Mood congruent that flucuates depending on his mood. None when interacting with meals, groups, milleu. Only occurs when lonely. Non-commanding.  Ideas of Reference: None   Suicidal Thoughts:   No   Homicidal Thoughts:   No    Memory:   Immediate Good; Recent Good; Remote Fair    Judgement:   Good   Insight:   Shallow    Psychomotor Activity:   Extrapyramidal Side Effects (EPS) (Otherwise normal motor activity with no tremors) Tardive Dyskinesia (Lateral jaw movement on distraction, patient reported not noticing until pointing it out) Yes    Concentration:   Good    Attention Span:   Good   Recall:   Good    Fund of Knowledge:   Good    Language:   Good    Handed:   Right    Assets:   Communication Skills; Desire for Improvement; Talents/Skills; Social Support; Leisure Time    ADL's:  Intact  Cognition:  WNL  Sleep:   Good    AIMS: Facial and Oral Movements Muscles of Facial Expression: None, normal Lips and Perioral Area: None, normal Jaw: Minimal Tongue: None, normal,Extremity Movements Upper (arms, wrists, hands, fingers): None, normal Lower (legs, knees, ankles, toes): None, normal, Trunk  Movements Neck, shoulders, hips: None, normal, Overall Severity Severity of abnormal movements (highest score from questions above): Minimal Incapacitation due to abnormal movements: None, normal Patient's awareness of abnormal movements (rate only patient's report): No Awareness, Dental Status Current problems with teeth and/or dentures?: No Does patient usually wear dentures?: No   AIMS 2     Social History   Tobacco Use  Smoking Status Never  Smokeless Tobacco Never   Tobacco Cessation: N/A, patient does not currently use tobacco products  Blood Alcohol level:  Lab Results  Component Value Date   ETH <10 02/02/2021   ETH <10 25/95/6387    Metabolic Disorder Labs:  Lab Results  Component Value Date   HGBA1C 6.0 (H) 02/03/2021   MPG 126 02/03/2021   MPG 114 02/02/2021   Lab Results  Component Value Date   PROLACTIN 23.4 (H) 11/28/2018   Lab Results  Component Value Date   CHOL 131 01/30/2021   TRIG 104 01/30/2021   HDL  30 (L) 01/30/2021   CHOLHDL 4.4 01/30/2021   VLDL 21 01/30/2021   LDLCALC 80 01/30/2021   LDLCALC 69 08/25/2019    Discharge destination:  Other:  Shelter  Is patient on multiple antipsychotic therapies at discharge:  No   Has Patient had three or more failed trials of antipsychotic monotherapy by history:  Yes,   Antipsychotic medications that previously failed include:   1.  Risperdal., 2.  Abilify., and 3.  Haldol.   Recommended Plan for Multiple Antipsychotic Therapies: Additional reason(s) for multiple antispychotic treatment:  Required 2 antipsychotics for stabilization. Currently stable on haldol decanoate LAI + zyprexa  Discharge Instructions     Diet - low sodium heart healthy   Complete by: As directed    Increase activity slowly   Complete by: As directed       Allergies as of 02/21/2021   No Known Allergies      Medication List     STOP taking these medications    amphetamine-dextroamphetamine 5 MG  tablet Commonly known as: ADDERALL       TAKE these medications      Indication  benztropine 1 MG tablet Commonly known as: COGENTIN Take 1 tablet (1 mg total) by mouth 2 (two) times daily as needed for tremors (Muscle stiffness and pain (EPS)). What changed:  when to take this reasons to take this  Indication: Extrapyramidal Reaction caused by Medications   FLUoxetine 20 MG capsule Commonly known as: PROZAC Take 1 capsule (20 mg total) by mouth daily.  Indication: Depressive Phase of Manic-Depression   haloperidol decanoate 100 MG/ML injection Commonly known as: HALDOL DECANOATE Inject 1 mL (100 mg total) into the muscle every 28 (twenty-eight) days for 28 days. Start taking on: February 24, 2021  Indication: Manic Phase of Manic-Depression, MIXED BIPOLAR AFFECTIVE DISORDER, Schizophrenia   melatonin 3 MG Tabs tablet Take 1 tablet (3 mg total) by mouth at bedtime.  Indication: Trouble Sleeping   OLANZapine 15 MG tablet Commonly known as: ZYPREXA Take 1 tablet (15 mg total) by mouth at bedtime.  Indication: Schizophrenia   valbenazine 40 MG capsule Commonly known as: INGREZZA Take 1 capsule (40 mg total) by mouth every evening.  Indication: Tardive Dyskinesia        Follow-up recommendations:   Activity: as tolerated  Diet: heart healthy  Other: -Follow-up with your outpatient psychiatric provider -instructions on appointment date, time, and address (location) are provided to you in discharge paperwork.  -Take your psychiatric medications as prescribed at discharge - instructions are provided to you in the discharge paperwork  -Follow-up with outpatient primary care doctor and other specialists -for management of chronic medical disease, including:  Prediabetes  -Testing: Follow-up with outpatient provider for abnormal lab results:  A1c  -Recommend abstinence from alcohol, tobacco, and other illicit drug use at discharge.   -If your psychiatric symptoms  recur, worsen, or if you have side effects to your psychiatric medications, call your outpatient psychiatric provider, 911, [phone] or go to the nearest emergency department.  -If suicidal thoughts recur, call your outpatient psychiatric provider, 911, [phone] or go to the nearest emergency department.  Comments: # Prescriptions provided or sent directly to preferred pharmacy at discharge. Patient agreeable to plan. Given opportunity to ask questions. Appears to feel comfortable with discharge denies any current suicidal or homicidal thought.   # Patient is also instructed prior to discharge to: Take all medications as prescribed by mental healthcare provider. Report any adverse effects and or reactions  from the medicines to outpatient provider promptly. Patient has been instructed & cautioned: To not engage in alcohol and or illegal drug use while on prescription medicines. In the event of worsening symptoms,  patient is instructed to call the crisis hotline, 911 and or go to the nearest ED for appropriate evaluation and treatment of symptoms. To follow-up with primary care provider for other medical issues, concerns and or health care needs   # The patient was evaluated each day by a clinical provider to ascertain response to treatment. Improvement was noted by the patient's report of decreasing symptoms, improved sleep and appetite, affect, medication tolerance, behavior, and participation in unit programming.  Patient was asked each day to complete a self inventory noting mood, mental status, pain, new symptoms, anxiety and concerns.   # Patient responded well to medication and being in a therapeutic and supportive environment. Positive and appropriate behavior was noted and the patient was motivated for recovery. The patient worked closely with the treatment team and case manager to develop a discharge plan with appropriate goals. Coping skills, problem solving as well as relaxation therapies were also part  of the unit programming.   # By the day of discharge patient was in much improved condition than upon admission.  Symptoms were reported as significantly decreased or resolved completely. The patient denied having suicidal thoughts, intent or plan, which were explored carefully.  The patient denied having homicidal thoughts.  The patient denies having auditory hallucinations, visual hallucinations, paranoia, or other symptoms of psychosis. The patient reports that her mood is stable. The patient was motivated to continue taking medication with a goal of continued improvement in mental health.    Patient was discharged to shelter with a plan to follow up as noted below.  Signed: Merrily Brittle, DO Psychiatry Resident, PGY-1 [facility] 02/21/2021, 9:44 AM

## 2021-02-21 NOTE — Progress Notes (Signed)
RN met with pt and reviewed pt's discharge instructions. Pt verbalized understanding of discharge instructions and pt did not have any questions. RN reviewed and provided pt with a copy of SRA, AVS and Transition Record. RN returned pt's belongings to pt. Medication samples and paper prescriptions ere given to pt. Pt denied SI/HI and voiced no concerns. Pt was appreciative of the care pt received at [facility]. Patient discharged to the lobby without incident.

## 2021-02-21 NOTE — [facility] Suicide Risk Assessment (Addendum)
[facility] Discharge Suicide Risk Assessment  Principal Problem: Schizoaffective disorder, bipolar type [facility]) Discharge Diagnoses:  Principal Problem:   Schizoaffective disorder, bipolar type ([place]) Active Problems:   Homeless   Tardive dyskinesia   HOSPITAL COURSE: Wesley Peterson is a 36 y.o. male with PPHx schizoaffective d/o who presented to [facility] ED for acute psychosis. He was then admitted involuntarily to [facility] for treatment of psychosis exacerbated by poor therapeutic compliance and stimulant use.  During the patient's hospitalization, patient had extensive initial psychiatric evaluation, and follow-up psychiatric evaluations every day.  Psychiatric diagnoses provided upon initial assessment:  Schizoaffective disorder, bipolar type  During hospitalization, the diagnosis list was revised to reflect:  Schizoaffective disorder, bipolar type - currently in depressive episode with psychotic features (mood congruent AH) Tardive dyskinesia  Patient's psychiatric medications were adjusted on admission:  Restarted home Cogentin 1 mg BID for EPS Haldol Decanoate 100 mg IM (01/29/2021)-received prior to admission Restarted home Haldol 5 mg p.o. daily  During the hospitalization, other adjustments were made to the patient's psychiatric medication regimen:  Started Abilify, titrated up to 20 and tapered off due to ineffectiveness Started Ingrezza 40 mg for tardive dyskinesia-lateral motion of lower jaw on distraction Started and titrated Zyprexa 15 mg nightly for residual psychosis and delusions Changed Cogentin to TID PRN Discontinued Haldol for tardive dyskinesia Started and up titrated Prozac to 20 mg daily for MDD with psychotic features  Patient's next Haldol Decanoate 100 mg IM injection is due on 02/24/2021    Gradually, patient started adjusting to milieu.   Patient's care was discussed during the interdisciplinary team meeting every day during the hospitalization.  The  patient had muscle stiffness (acute dystonia) and involuntary movements (tardive dyskinesia) side effects to prescribed psychiatric medication that is resolved or managed with medications (cogentin for muscle stiffness as needed and ingrezza for involuntary movements).   The patient reports their target psychiatric symptoms of hypersomnia, decreased concentration, decreased attention, anxiety, auditory hallucinations, depression, paranoia responded well to the psychiatric medications, and the patient reports overall benefit other psychiatric hospitalization. Supportive psychotherapy was provided to the patient. The patient also participated in regular group therapy while admitted.   Labs were reviewed with the patient, and abnormal results were discussed with the patient.  The patient denied having suicidal thoughts more than 48 hours prior to discharge.  Patient denies having homicidal thoughts.  Patient denies having auditory hallucinations.  Patient denies any visual hallucinations.  Patient denies having paranoid thoughts.  The patient is able to verbalize their individual safety plan to this provider.  It is recommended to the patient to continue psychiatric medications as prescribed, after discharge from the hospital.    It is recommended to the patient to follow up with your outpatient psychiatric provider and PCP.  Discussed with the patient, the impact of alcohol, drugs, tobacco have been there overall psychiatric and medical wellbeing, and total abstinence from substance use was recommended the patient.  Total Time spent with patient:  I personally spent 35 minutes on the unit in direct patient care. The direct patient care time included face-to-face time with the patient, reviewing the patient's chart, communicating with other professionals, and coordinating care. Greater than 50% of this time was spent in counseling or coordinating care with the patient regarding goals of  hospitalization, psycho-education, and discharge planning needs.   Musculoskeletal: Strength & Muscle Tone: within normal limits Gait & Station: normal Patient leans: N/A  Psychiatric Specialty Exam: Physical exam - See discharge  summary  Review of systems - see discharge summary  Body mass index is 20.08 kg/m. Temp:  [98 F (36.7 C)-98.3 F (36.8 C)] 98 F (36.7 C) (02/13 0615) Pulse Rate:  [77-101] 101 (02/13 0617) Resp:  [18] 18 (02/13 0617) BP: (130-137)/(72-95) 137/95 (02/13 0617) SpO2:  [97 %-98 %] 98 % (02/13 0615)  General Appearance:  Appropriate for Environment; Casual; Fairly Groomed    Eye Contact:   Fair    Speech:   Clear and Coherent; Normal Rate (Spontaneous)    Volume:   Normal    Mood:   Euthymic    Affect:   Appropriate; Congruent; Constricted (Affect brightens up intermittently during interview and appeared full range)    Thought Process:   Coherent; Goal Directed; Linear (Can be concrete at times when talking about his delusions, otherwise organized)  Descriptions of Associations: Intact  Duration of Psychotic Symptoms:  Greater than six months  Past Diagnosis of Schizophrenia or Psychoactive disorder:  Yes   Orientation:   Full (Time, Place and Person)   Thought Content:   Delusions; Logical (Fixed paranoid delusions about family that is unchaged. Future oriented. Denied thought insertion, thought broadcasting, magical thinking, ideas of reference.)  Hallucinations: Hallucinations: Auditory Description of Auditory Hallucinations: Mood congruent that flucuates depending on his mood. None when interacting with meals, groups, milleu. Only occurs when lonely. Non-commanding.  Ideas of Reference: None   Suicidal Thoughts:   No   Homicidal Thoughts:   No    Memory:   Immediate Good; Recent Good; Remote Fair    Judgement:   Good   Insight:   Shallow    Psychomotor Activity:   Extrapyramidal Side Effects (EPS) (Otherwise  normal motor activity with no tremors) Tardive Dyskinesia (Lateral jaw movement on distraction, patient reported not noticing until pointing it out) Yes    Concentration:   Good    Attention Span:   Good   Recall:   Good    Fund of Knowledge:   Good    Language:   Good    Handed:   Right    Assets:   Communication Skills; Desire for Improvement; Talents/Skills; Social Support; Leisure Time    ADL's:  Intact  Cognition:  WNL  Sleep:  Sleep: Good   AIMS: Facial and Oral Movements Muscles of Facial Expression: None, normal Lips and Perioral Area: None, normal Jaw: Minimal Tongue: None, normal,Extremity Movements Upper (arms, wrists, hands, fingers): None, normal Lower (legs, knees, ankles, toes): None, normal, Trunk Movements Neck, shoulders, hips: None, normal, Overall Severity Severity of abnormal movements (highest score from questions above): Minimal Incapacitation due to abnormal movements: None, normal Patient's awareness of abnormal movements (rate only patient's report): No Awareness, Dental Status Current problems with teeth and/or dentures?: No Does patient usually wear dentures?: No       Mental Status Per Nursing Assessment::   On Admission: NA  Demographic Factors: Male, Adolescent or young adult, Unemployed  Loss Factors: NA, Financial problems / change in socioeconomic status  Historical Factors: Impulsivity  Risk Reduction Factors: Living with another person, especially a relative, Positive social support  Continued Clinical Symptoms:  Depression:   Delusional Schizophrenia:   Depressive state Less than 64 years old More than one psychiatric diagnosis Previous Psychiatric Diagnoses and Treatments  Cognitive Features That Contribute To Risk:  None    Suicide Risk:  Minimal to mild: Patients may be classified as minimal/mild risk based on the severity of the depressive symptoms.    Follow-up  Information     Services, [facility].  Go on 02/23/2021.   Why: You have a hospital follow up appointment for therapy and medication management services on 02/23/21 at 10:00 am.  This appointment will be held in person. Contact information: [place] [address] [phone]                  Plan Of Care/Follow-up recommendations:  Activity: as tolerated   Diet: heart healthy   Other: -Follow-up with your outpatient psychiatric provider -instructions on appointment date, time, and address (location) are provided to you in discharge paperwork.   -Take your psychiatric medications as prescribed at discharge - instructions are provided to you in the discharge paperwork   -Follow-up with outpatient primary care doctor and other specialists -for management of chronic medical disease, including:  Prediabetes  -Testing: Follow-up with outpatient provider for abnormal lab results:  A1c   -Recommend abstinence from alcohol, tobacco, and other illicit drug use at discharge.    -If your psychiatric symptoms recur, worsen, or if you have side effects to your psychiatric medications, call your outpatient psychiatric provider, 911, [phone] or go to the nearest emergency department.   -If suicidal thoughts recur, call your outpatient psychiatric provider, 911, [phone] or go to the nearest emergency department.  Signed: Merrily Brittle, DO Psychiatry Resident, PGY-1 [facility] 02/21/2021, 9:38 AM

## 2021-04-04 NOTE — Congregational Nurse Program (Signed)
NO complaints or concerns. BP ? ? ?RNCongregational Nurse Program Note ? 120/78; HR 84. Jenene Slicker ?Date of Encounter: 04/04/2021 ? ?Past Medical History: ?Past Medical History:  ?Diagnosis Date  ? Schizophrenia (HCC)   ? Schizophrenia, paranoid type (HCC) 02/02/2021  ? ? ?Encounter Details: ? CNP Questionnaire - 04/04/21 2255   ? ?  ? Questionnaire  ? Do you give verbal consent to treat you today? Yes   ? Location Patient Served  Home of [address], [place]   ? Visit Setting Church or Organization   ? Patient Status Homeless   ? Insurance Unknown   ? Insurance Referral N/A   ? Medication N/A   ? Medical Provider No   ? Screening Referrals N/A   ? Medical Referral N/A   ? Medical Appointment Made N/A   ? Food Have Food Insecurities   ? Housing/Utilities No permanent housing   ? Interpersonal Safety N/A   ? Intervention Blood pressure   ? ED Visit Averted N/A   ? Life-Saving Intervention Made N/A   ? ?  ?  ? ?  ? ? ? ? ?

## 2021-05-30 ENCOUNTER — Encounter (HOSPITAL_COMMUNITY): Payer: Self-pay | Admitting: *Deleted

## 2021-05-30 ENCOUNTER — Other Ambulatory Visit: Payer: Self-pay

## 2021-05-30 ENCOUNTER — Emergency Department (HOSPITAL_COMMUNITY)
Admission: EM | Admit: 2021-05-30 | Discharge: 2021-06-01 | Disposition: A | Discharge status: Other Acute Inpatient Hospital | Payer: Medicaid Other | Attending: Emergency Medicine | Admitting: Emergency Medicine

## 2021-05-30 DIAGNOSIS — R440 Auditory hallucinations: Secondary | ICD-10-CM

## 2021-05-30 DIAGNOSIS — F209 Schizophrenia, unspecified: Secondary | ICD-10-CM

## 2021-05-30 DIAGNOSIS — F250 Schizoaffective disorder, bipolar type: Secondary | ICD-10-CM | POA: Insufficient documentation

## 2021-05-30 DIAGNOSIS — Z20822 Contact with and (suspected) exposure to covid-19: Secondary | ICD-10-CM | POA: Insufficient documentation

## 2021-05-30 DIAGNOSIS — Z5900 Homelessness unspecified: Secondary | ICD-10-CM | POA: Insufficient documentation

## 2021-05-30 DIAGNOSIS — F22 Delusional disorders: Secondary | ICD-10-CM | POA: Insufficient documentation

## 2021-05-30 DIAGNOSIS — R45851 Suicidal ideations: Secondary | ICD-10-CM | POA: Insufficient documentation

## 2021-05-30 DIAGNOSIS — Y900 Blood alcohol level of less than 20 mg/100 ml: Secondary | ICD-10-CM | POA: Insufficient documentation

## 2021-05-30 LAB — COMPREHENSIVE METABOLIC PANEL
ALT: 19 U/L (ref 0–44)
AST: 21 U/L (ref 15–41)
Albumin: 4.5 g/dL (ref 3.5–5.0)
Alkaline Phosphatase: 65 U/L (ref 38–126)
Anion gap: 7 (ref 5–15)
BUN: 8 mg/dL (ref 6–20)
CO2: 27 mmol/L (ref 22–32)
Calcium: 9.5 mg/dL (ref 8.9–10.3)
Chloride: 106 mmol/L (ref 98–111)
Creatinine, Ser: 0.95 mg/dL (ref 0.61–1.24)
GFR, Estimated: >60 mL/min (ref >60)
Glucose, Bld: 88 mg/dL (ref 70–99)
Potassium: 4.2 mmol/L (ref 3.5–5.1)
Sodium: 140 mmol/L (ref 135–145)
Total Bilirubin: 0.3 mg/dL (ref 0.3–1.2)
Total Protein: 7.5 g/dL (ref 6.5–8.1)

## 2021-05-30 LAB — CBC WITH DIFFERENTIAL/PLATELET
Abs Immature Granulocytes: 0.01 10*3/uL (ref 0.00–0.07)
Basophils Absolute: 0 10*3/uL (ref 0.0–0.1)
Basophils Relative: 1 %
Eosinophils Absolute: 0.1 10*3/uL (ref 0.0–0.5)
Eosinophils Relative: 2 %
HCT: 44.6 % (ref 39.0–52.0)
Hemoglobin: 14.2 g/dL (ref 13.0–17.0)
Immature Granulocytes: 0 %
Lymphocytes Relative: 24 %
Lymphs Abs: 1.4 10*3/uL (ref 0.7–4.0)
MCH: 30.1 pg (ref 26.0–34.0)
MCHC: 31.8 g/dL (ref 30.0–36.0)
MCV: 94.5 fL (ref 80.0–100.0)
Monocytes Absolute: 0.6 10*3/uL (ref 0.1–1.0)
Monocytes Relative: 11 %
Neutro Abs: 3.5 10*3/uL (ref 1.7–7.7)
Neutrophils Relative %: 62 %
Platelets: 285 10*3/uL (ref 150–400)
RBC: 4.72 MIL/uL (ref 4.22–5.81)
RDW: 14.4 % (ref 11.5–15.5)
WBC: 5.7 10*3/uL (ref 4.0–10.5)
nRBC: 0 % (ref 0.0–0.2)

## 2021-05-30 LAB — ETHANOL: Alcohol, Ethyl (B): <10 mg/dL (ref <10)

## 2021-05-30 MED ORDER — BENZTROPINE MESYLATE 1 MG PO TABS: 1.0000 mg | ORAL_TABLET | Freq: Two times a day (BID) | ORAL | Status: DC | PRN

## 2021-05-30 MED ORDER — LORAZEPAM 1 MG PO TABS
2.0000 mg | ORAL_TABLET | Freq: Once | ORAL | Status: AC
  Administered 2021-05-30: 2 mg via ORAL
  Filled 2021-05-30: qty 2

## 2021-05-30 MED ORDER — FLUOXETINE HCL 20 MG PO CAPS
20.0000 mg | ORAL_CAPSULE | Freq: Every day | ORAL | Status: DC
  Administered 2021-05-30 – 2021-06-01 (×3): 20 mg via ORAL
  Filled 2021-05-30 (×3): qty 1

## 2021-05-30 MED ORDER — OLANZAPINE 5 MG PO TABS
15.0000 mg | ORAL_TABLET | Freq: Every day | ORAL | Status: DC
  Administered 2021-05-30 – 2021-05-31 (×2): 15 mg via ORAL
  Filled 2021-05-30 (×2): qty 3

## 2021-05-30 NOTE — ED Provider Notes (Signed)
[facility] EMERGENCY DEPARTMENT Provider Note   CSN: [id number] Arrival date & time: 05/30/21  1309     History  Chief Complaint  Patient presents with   V70.1    Yale Golla is a 36 y.o. male.  HPI Patient presenting for auditory hallucinations.  His medical history includes schizophrenia and homelessness.  His most recent psychiatric admission was in February.  At that time, his medication list included Haldol decanoate, Cogentin, Ingrezza, Zyprexa, and Prozac.  He has reportedly been nonadherent to his medications, although he reports he has been taking them.  He states that his auditory hallucinations have been worsening over the past 4 days.  Physically he felt well yesterday.  He had poor sleep last night and this caused him to feel fatigued today.  He has had less of an appetite.  He denies any other physical symptoms.    Home Medications Prior to Admission medications   Medication Sig Start Date End Date Taking? Authorizing Provider  benztropine (COGENTIN) 1 MG tablet Take 1 tablet (1 mg total) by mouth 2 (two) times daily as needed for tremors (Muscle stiffness and pain (EPS)). 02/21/21 03/23/21  [name], DO  FLUoxetine (PROZAC) 20 MG capsule Take 1 capsule (20 mg total) by mouth daily. 02/21/21 03/23/21  [name], DO  haloperidol decanoate (HALDOL DECANOATE) 100 MG/ML injection Inject 1 mL (100 mg total) into the muscle every 28 (twenty-eight) days for 28 days. 02/24/21 03/24/21  [name], DO  melatonin 3 MG TABS tablet Take 1 tablet (3 mg total) by mouth at bedtime. 02/21/21   [name], DO  OLANZapine (ZYPREXA) 15 MG tablet Take 1 tablet (15 mg total) by mouth at bedtime. 02/21/21 03/23/21  [name], DO      Allergies    Patient has no known allergies.    Review of Systems   Review of Systems  Constitutional:  Positive for fatigue.  Psychiatric/Behavioral:  Positive for hallucinations. Negative for self-injury.   All other systems reviewed and are  negative.  Physical Exam Updated Vital Signs BP 131/90   Pulse 84   Temp 98.2 F (36.8 C) (Oral)   Resp 16   Ht 6\' 1"  (1.854 m)   Wt 72.6 kg   SpO2 99%   BMI 21.11 kg/m  Physical Exam Vitals and nursing note reviewed.  Constitutional:      General: He is not in acute distress.    Appearance: Normal appearance. He is well-developed and normal weight. He is not ill-appearing, toxic-appearing or diaphoretic.  HENT:     Head: Normocephalic and atraumatic.     Right Ear: External ear normal.     Left Ear: External ear normal.     Nose: Nose normal.     Mouth/Throat:     Mouth: Mucous membranes are moist.     Pharynx: Oropharynx is clear.  Eyes:     Extraocular Movements: Extraocular movements intact.     Conjunctiva/sclera: Conjunctivae normal.  Cardiovascular:     Rate and Rhythm: Normal rate and regular rhythm.     Heart sounds: No murmur heard. Pulmonary:     Effort: Pulmonary effort is normal. No respiratory distress.  Abdominal:     General: Abdomen is flat. There is no distension.     Palpations: Abdomen is soft.  Musculoskeletal:        General: No swelling. Normal range of motion.     Cervical back: Normal range of motion and neck supple.     Right  lower leg: No edema.     Left lower leg: No edema.  Skin:    General: Skin is warm and dry.     Capillary Refill: Capillary refill takes less than 2 seconds.     Coloration: Skin is not jaundiced or pale.  Neurological:     General: No focal deficit present.     Mental Status: He is alert and oriented to person, place, and time.     Cranial Nerves: No cranial nerve deficit.     Sensory: No sensory deficit.     Motor: No weakness.     Coordination: Coordination normal.  Psychiatric:        Attention and Perception: He perceives auditory hallucinations.        Mood and Affect: Affect is flat.        Speech: Speech is delayed.        Behavior: Behavior is slowed and withdrawn.        Thought Content: Thought  content is paranoid and delusional. Thought content does not include homicidal or suicidal ideation.    ED Results / Procedures / Treatments   Labs (all labs ordered are listed, but only abnormal results are displayed) Labs Reviewed  RESP PANEL BY RT-PCR (FLU A&B, COVID) [id number]  COMPREHENSIVE METABOLIC PANEL  ETHANOL  CBC WITH DIFFERENTIAL/PLATELET  RAPID URINE DRUG SCREEN, HOSP PERFORMED    EKG None  Radiology No results found.  Procedures Procedures    Medications Ordered in ED Medications  OLANZapine (ZYPREXA) tablet 15 mg (has no administration in time range)  FLUoxetine (PROZAC) capsule 20 mg (has no administration in time range)  benztropine (COGENTIN) tablet 1 mg (has no administration in time range)    ED Course/ Medical Decision Making/ A&P                           Medical Decision Making Amount and/or Complexity of Data Reviewed Labs: ordered.   This patient presents to the ED for concern of decompensated schizophrenia, this involves an extensive number of treatment options, and is a complaint that carries with it a high risk of complications and morbidity.  The differential diagnosis includes medication nonadherence, medication ineffective, illicit drug use   Co morbidities that complicate the patient evaluation  Schizophrenia   Additional history obtained:  Additional history obtained from N/A External records from outside source obtained and reviewed including EMR   Lab Tests:  I Ordered, and personally interpreted labs.  The pertinent results include: Normal hemoglobin, no leukocytosis, normal electrolytes  Consultations Obtained:  I requested consultation with the TTS,  and discussed lab and imaging findings as well as pertinent plan - they recommend: (Pending)   Problem List / ED Course / Critical interventions / Medication management  Patient is a 36 year old male with history of schizophrenia, presenting for worsening auditory loose  Nations over the past 4 days.  Although he reports that he has been taking his medications, his mother stated in triage that he has not.  He does endorse active auditory hallucinations.  He denies any command hallucinations.  Physically, he has felt well up until today when he was fatigued.  He attributes this to poor sleep last night.  Patient is well-appearing on arrival.  His vital signs are normal.  He has flat affect and appears withdrawn.  Laboratory work-up was initiated.  Results of serum lab work were normal.  Patient is medically cleared at this time.  TTS  consult ordered.  Patient agreeable to stay for TTS evaluation.  Home medications ordered. I ordered medication including Zyprexa and Prozac for resumption of home medications   Social Determinants of Health:  History of schizophrenia        Final Clinical Impression(s) / ED Diagnoses Final diagnoses:  Schizophrenia, unspecified type [facility])  Auditory hallucinations    Rx / DC Orders ED Discharge Orders     None         Gloris Manchester, MD 05/30/21 1624

## 2021-05-30 NOTE — ED Triage Notes (Signed)
Pt states he hears voices; pt is non-compliant with his medication; pt states the voices are wanting to hurt him; mom states pt tells her people are stealing things from him; mom states pt has been intermittent with taking his meds; mom states pt told him he needs help

## 2021-05-30 NOTE — ED Notes (Signed)
Pt EKG done on day-shift via portable Copy is in MD office

## 2021-05-30 NOTE — ED Notes (Signed)
ED Provider at bedside. 

## 2021-05-30 NOTE — ED Notes (Signed)
Pt has not had to urinate yet--unable to obtain urine sample at this time

## 2021-05-31 DIAGNOSIS — R45851 Suicidal ideations: Secondary | ICD-10-CM | POA: Insufficient documentation

## 2021-05-31 LAB — RESP PANEL BY RT-PCR (FLU A&B, COVID) ARPGX2
Influenza A by PCR: NEGATIVE
Influenza B by PCR: NEGATIVE
SARS Coronavirus 2 by RT PCR: NEGATIVE

## 2021-05-31 NOTE — ED Notes (Signed)
Unable to complete assessment . Pt too sleepy

## 2021-05-31 NOTE — ED Notes (Signed)
BH called in for TTS, Pt went to bathroom so BH will call back for TTS

## 2021-05-31 NOTE — BH Assessment (Signed)
@   1725 - Attempted to assess patient.  He was not in the bed and after a period of 10 minutes, staff reported he is still in the restroom.  Patient will be assessed at a later time.

## 2021-05-31 NOTE — ED Provider Notes (Signed)
Emergency Medicine Observation Re-evaluation Note  Wesley Peterson is a 36 y.o. male, seen on rounds today.  Pt initially presented to the ED for complaints of V70.1 Currently, the patient is resting quietly.  Physical Exam  BP 106/61 (BP Location: Right Arm)   Pulse (!) 51   Temp 98.7 F (37.1 C)   Resp 16   Ht 6\' 1"  (1.854 m)   Wt 72.6 kg   SpO2 99%   BMI 21.11 kg/m  Physical Exam General: No acute distress Cardiac: Well-perfused Lungs: Nonlabored Psych: Unable to assess at this time  ED Course / MDM  EKG:EKG Interpretation  Date/Time:  Monday May 30 2021 18:28:24 EDT Ventricular Rate:  70 PR Interval:  146 QRS Duration: 88 QT Interval:  406 QTC Calculation: 438 R Axis:   86 Text Interpretation: Normal sinus rhythm ST elevation, consider early repolarization Borderline ECG No significant change since prior 2/23 Confirmed by 3/23 [phone]) on 05/31/2021 6:59:05 AM  I have reviewed the labs performed to date as well as medications administered while in observation.  Recent changes in the last 24 hours include [facility] attempted assessment.  Plan  Current plan is for reassessment by [facility] when more awake.  Wesley Peterson is not under involuntary commitment.     Wesley Emory, MD 05/31/21 1726
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
## 2021-05-31 NOTE — ED Notes (Signed)
Tele psych at bedside for assessment 

## 2021-05-31 NOTE — BH Assessment (Signed)
TTS clinician attempted to complete assessment. Kayla, RN, unable to arouse patient from sleep after multiple attempts. TTS will attempt at later time.

## 2021-06-01 ENCOUNTER — Other Ambulatory Visit: Payer: Self-pay

## 2021-06-01 ENCOUNTER — Inpatient Hospital Stay (HOSPITAL_COMMUNITY)
Admission: AD | Admit: 2021-06-01 | Discharge: 2021-06-10 | DRG: 885 | Disposition: A | Discharge status: Home / Self Care | Payer: Federal, State, Local not specified - Other | Source: Intra-hospital | Attending: Psychiatry | Admitting: Psychiatry

## 2021-06-01 ENCOUNTER — Encounter (HOSPITAL_COMMUNITY): Payer: Self-pay | Admitting: Psychiatry

## 2021-06-01 DIAGNOSIS — Z5900 Homelessness unspecified: Secondary | ICD-10-CM | POA: Diagnosis not present

## 2021-06-01 DIAGNOSIS — F251 Schizoaffective disorder, depressive type: Principal | ICD-10-CM | POA: Diagnosis present

## 2021-06-01 DIAGNOSIS — R7303 Prediabetes: Secondary | ICD-10-CM | POA: Diagnosis present

## 2021-06-01 DIAGNOSIS — K30 Functional dyspepsia: Secondary | ICD-10-CM | POA: Diagnosis present

## 2021-06-01 DIAGNOSIS — Z79899 Other long term (current) drug therapy: Secondary | ICD-10-CM | POA: Diagnosis not present

## 2021-06-01 DIAGNOSIS — Z91411 Personal history of adult psychological abuse: Secondary | ICD-10-CM

## 2021-06-01 DIAGNOSIS — F411 Generalized anxiety disorder: Secondary | ICD-10-CM | POA: Diagnosis present

## 2021-06-01 DIAGNOSIS — F209 Schizophrenia, unspecified: Principal | ICD-10-CM

## 2021-06-01 DIAGNOSIS — Z91410 Personal history of adult physical and sexual abuse: Secondary | ICD-10-CM | POA: Diagnosis not present

## 2021-06-01 DIAGNOSIS — G4700 Insomnia, unspecified: Secondary | ICD-10-CM | POA: Diagnosis present

## 2021-06-01 DIAGNOSIS — Z91148 Patient's other noncompliance with medication regimen for other reason: Secondary | ICD-10-CM

## 2021-06-01 DIAGNOSIS — F200 Paranoid schizophrenia: Secondary | ICD-10-CM | POA: Diagnosis not present

## 2021-06-01 LAB — POC SARS CORONAVIRUS 2 AG -  ED: SARSCOV2ONAVIRUS 2 AG: NEGATIVE

## 2021-06-01 MED ORDER — RISPERIDONE 2 MG PO TABS
2.0000 mg | ORAL_TABLET | Freq: Every day | ORAL | Status: DC
Start: 2021-06-01 — End: 2021-06-02
  Filled 2021-06-01 (×3): qty 1

## 2021-06-01 MED ORDER — ALUM & MAG HYDROXIDE-SIMETH 200-200-20 MG/5ML PO SUSP: 30.0000 mL | ORAL | Status: DC | PRN

## 2021-06-01 MED ORDER — BENZTROPINE MESYLATE 0.5 MG PO TABS
0.5000 mg | ORAL_TABLET | Freq: Every day | ORAL | Status: DC
  Filled 2021-06-01 (×3): qty 1

## 2021-06-01 MED ORDER — HYDROXYZINE HCL 25 MG PO TABS
25.0000 mg | ORAL_TABLET | Freq: Three times a day (TID) | ORAL | Status: DC | PRN
  Administered 2021-06-02 – 2021-06-08 (×6): 25 mg via ORAL
  Filled 2021-06-01 (×7): qty 1

## 2021-06-01 MED ORDER — MAGNESIUM HYDROXIDE 400 MG/5ML PO SUSP: 30.0000 mL | Freq: Every day | ORAL | Status: DC | PRN

## 2021-06-01 MED ORDER — ACETAMINOPHEN 325 MG PO TABS
650.0000 mg | ORAL_TABLET | Freq: Four times a day (QID) | ORAL | Status: DC | PRN
  Administered 2021-06-04 – 2021-06-10 (×7): 650 mg via ORAL
  Filled 2021-06-01 (×7): qty 2

## 2021-06-01 MED ORDER — TRAZODONE HCL 50 MG PO TABS
50.0000 mg | ORAL_TABLET | Freq: Every evening | ORAL | Status: DC | PRN
  Administered 2021-06-02: 50 mg via ORAL
  Filled 2021-06-01 (×2): qty 1

## 2021-06-01 NOTE — ED Notes (Signed)
Pt's vitals need updating, but we will defer them until pt wakens. Brooke, Sitter/NT informed to obtain vitals when he wakes. She reported that the previous sitter informed her that they had attempted to get vitals earlier, but the pt refused. Sitter informed to notify RN if this happens again when he wakes.

## 2021-06-01 NOTE — Tx Team (Signed)
Initial Treatment Plan 06/01/2021 3:49 PM Woods Gangemi HBZ:[id number]    PATIENT STRESSORS: Other: AVH     PATIENT STRENGTHS: Motivation for treatment/growth  Supportive family/friends    PATIENT IDENTIFIED PROBLEMS: AVH  Paranoia   Anxiety   Loneliness               DISCHARGE CRITERIA:  Ability to meet basic life and health needs Adequate post-discharge living arrangements Improved stabilization in mood, thinking, and/or behavior  PRELIMINARY DISCHARGE PLAN: Outpatient therapy Return to previous living arrangement  PATIENT/FAMILY INVOLVEMENT: This treatment plan has been presented to and reviewed with the patient, Wesley Peterson.  The patient has been given the opportunity to ask questions and make suggestions.  Sofie Hartigan, RN 06/01/2021, 3:49 PM

## 2021-06-01 NOTE — Consult Note (Signed)
Patient has been accepted to [facility] behavioral health per [facility]  R.  - pending repeat rapid COVID results.    Attending [place] bed to be assigned on arrival.

## 2021-06-01 NOTE — ED Provider Notes (Signed)
Emergency Medicine Observation Re-evaluation Note  Wesley Peterson is a 36 y.o. male, seen on rounds today.  Pt initially presented to the ED for complaints of V70.1 Currently, the patient is resting, awakens easily.  Physical Exam  BP 133/73 (BP Location: Right Arm)   Pulse (!) 59   Temp 98.8 F (37.1 C) (Oral)   Resp 16   Ht 6\' 1"  (1.854 m)   Wt 72.6 kg   SpO2 100%   BMI 21.11 kg/m  Physical Exam General: No distress Cardiac: Regular rate and rhythm Lungs: No increased work of breathing Psych: Calm  ED Course / MDM  EKG:EKG Interpretation  Date/Time:  Monday May 30 2021 18:28:24 EDT Ventricular Rate:  70 PR Interval:  146 QRS Duration: 88 QT Interval:  406 QTC Calculation: 438 R Axis:   86 Text Interpretation: Normal sinus rhythm ST elevation, consider early repolarization Borderline ECG No significant change since prior 2/23 Confirmed by 3/23 [phone]) on 05/31/2021 6:59:05 AM  I have reviewed the labs performed to date as well as medications administered while in observation.  Recent changes in the last 24 hours include acceptance to our behavioral health facility.  Plan  Current plan is for transfer to behavioral health.  Wesley Peterson is not under involuntary commitment.     Wesley Emory, MD 06/01/21 1034

## 2021-06-01 NOTE — ED Notes (Signed)
Pt woken to give morning medication. Pt's affect is flat, appears uninterested in his situation. RN asks how he is feeling this morning and he shakes his head no. RN asks what that means to him, whether he feels the same or worse than when he came in. Pt states, "both". Pt took medication with no difficulty and then covered back up and seemed uninterested in speaking with RN any longer.

## 2021-06-01 NOTE — Progress Notes (Signed)
BHH/BMU LCSW Progress Note   06/01/2021    11:57 AM  Wesley Peterson   [id number]   Type of Contact and Topic:  Psychiatric Bed Placement   Pt accepted to [facility] 405-2    Patient meets inpatient criteria per Hillery Jacks, NP   The attending provider will be Phineas Inches, MD   Call report to [phone]    Fransico Him, RN @ [facility] notified.     Pt scheduled  to arrive at [facility] TODAY PENDING a negative COVID result.    Damita Dunnings, MSW, LCSW-A  11:58 AM 06/01/2021

## 2021-06-01 NOTE — ED Notes (Signed)
Attempted to call report. Accepting nurse currently passing meds and will call back for report.

## 2021-06-01 NOTE — Progress Notes (Signed)
Admission note: Pt presented to the ED due to complaints of auditory and visual hallucinations. Pt stated that he doesn't know what's been going on. Pt states that he has been taking his meds. Pt states that he has no stressors. Pt stated that he was paranoid that people were after him. Pt stated that he feels weird. Pt also stated that he wants to regain his strength. Writer asked how he lost it and pt stated that he was not sure I wanted to hear the truth. Pt then went to explain that people were coming in and out and he is not sure how to explain it. Pt asked if I was understanding what he was saying. Pt has a flat affect but seems to be more talkative then the beginning of his last admission at [facility] this year. Pt did not go into much more detail on why he was here. Consents signed, handbook detailing the patient's rights, responsibilities, and visitor guidelines provided. Skin/belongings search completed and patient oriented to unit. Patient stable at this time. Patient given the opportunity to express concerns and ask questions. Patient given toiletries. Will continue to monitor.    06/01/21 1500  Psych Admission Type (Psych Patients Only)  Admission Status Voluntary  Psychosocial Assessment  Patient Complaints Anger;Anxiety;Decreased concentration;Loneliness  Eye Contact Poor  Facial Expression Flat;Sad  Affect Anxious;Depressed;Sad;Flat  Speech Logical/coherent;Soft  Interaction Forwards little;Minimal  Motor Activity Other (Comment) (WDL)  Appearance/Hygiene Unremarkable  Behavior Characteristics Cooperative;Anxious  Mood Anxious;Sad;Pleasant  Thought Process  Coherency WDL  Content Paranoia  Delusions Paranoid  Perception Hallucinations  Hallucination Auditory;Visual  Judgment Limited  Confusion None  Danger to Self  Current suicidal ideation? Denies  Danger to Others  Danger to Others None reported or observed

## 2021-06-01 NOTE — BHH Group Notes (Signed)
PT did not attend NA group. 

## 2021-06-02 DIAGNOSIS — G4700 Insomnia, unspecified: Secondary | ICD-10-CM

## 2021-06-02 DIAGNOSIS — F411 Generalized anxiety disorder: Secondary | ICD-10-CM

## 2021-06-02 DIAGNOSIS — F200 Paranoid schizophrenia: Secondary | ICD-10-CM

## 2021-06-02 LAB — LIPID PANEL
Cholesterol: 137 mg/dL (ref 0–200)
HDL: 36 mg/dL — ABNORMAL LOW (ref >40)
LDL Cholesterol: 80 mg/dL (ref 0–99)
Total CHOL/HDL Ratio: 3.8 RATIO
Triglycerides: 105 mg/dL (ref <150)
VLDL: 21 mg/dL (ref 0–40)

## 2021-06-02 LAB — HEMOGLOBIN A1C
Hgb A1c MFr Bld: 5.9 % — ABNORMAL HIGH (ref 4.8–5.6)
Mean Plasma Glucose: 122.63 mg/dL

## 2021-06-02 LAB — TSH: TSH: 1.354 u[IU]/mL (ref 0.350–4.500)

## 2021-06-02 MED ORDER — ZIPRASIDONE MESYLATE 20 MG IM SOLR: 20.0000 mg | Freq: Three times a day (TID) | INTRAMUSCULAR | Status: DC | PRN

## 2021-06-02 MED ORDER — ZIPRASIDONE HCL 40 MG PO CAPS
40.0000 mg | ORAL_CAPSULE | Freq: Two times a day (BID) | ORAL | Status: DC
  Administered 2021-06-02 – 2021-06-04 (×5): 40 mg via ORAL
  Filled 2021-06-02 (×9): qty 1

## 2021-06-02 MED ORDER — LORAZEPAM 1 MG PO TABS: 1.0000 mg | ORAL_TABLET | Freq: Three times a day (TID) | ORAL | Status: DC | PRN

## 2021-06-02 MED ORDER — OLANZAPINE 10 MG IM SOLR: 5.0000 mg | Freq: Three times a day (TID) | INTRAMUSCULAR | Status: DC | PRN

## 2021-06-02 MED ORDER — RISPERIDONE 1 MG PO TABS
1.0000 mg | ORAL_TABLET | Freq: Every day | ORAL | Status: DC
  Administered 2021-06-02: 1 mg via ORAL
  Filled 2021-06-02 (×2): qty 1

## 2021-06-02 MED ORDER — OLANZAPINE 5 MG PO TABS: 5.0000 mg | ORAL_TABLET | Freq: Three times a day (TID) | ORAL | Status: DC | PRN

## 2021-06-02 MED ORDER — RISPERIDONE 2 MG PO TBDP: 2.0000 mg | ORAL_TABLET | Freq: Three times a day (TID) | ORAL | Status: DC | PRN

## 2021-06-02 NOTE — BHH Suicide Risk Assessment (Signed)
[facility] INPATIENT:  Family/Significant Other Suicide Prevention Education  Suicide Prevention Education:  Patient Refusal for Family/Significant Other Suicide Prevention Education: The patient Wesley Peterson has refused to provide written consent for family/significant other to be provided Family/Significant Other Suicide Prevention Education during admission and/or prior to discharge.  Physician notified.  CSW completed SPE with patient. Discussed potential triggers leading to suicidal ideation in addition to coping skills one might use in order to delay and distract self from self harming behaviors. CSW encouraged patient to utilize emergency services if they felt unable to maintain their safety. SPE flyer provided to patient at this time.   [name] 06/02/2021, 4:01 PM

## 2021-06-02 NOTE — Group Note (Signed)
Date:  06/02/2021 Time:  10:25 AM  Group Topic/Focus:  Orientation:   The focus of this group is to educate the patient on the purpose and policies of crisis stabilization and provide a format to answer questions about their admission.  The group details unit policies and expectations of patients while admitted.    Participation Level:  Did Not Attend  Participation Quality:    Affect:    Cognitive:    Insight:   Engagement in Group:    Modes of Intervention:    Additional Comments:    Jaquita Rector 06/02/2021, 10:25 AM

## 2021-06-02 NOTE — Progress Notes (Signed)
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
## 2021-06-02 NOTE — Progress Notes (Signed)
[facility] Group Notes:  (Nursing/MHT/Case Management/Adjunct)  Date:  06/02/2021  Time:  2015 Type of Therapy:   wrap up group  Participation Level:  Active  Participation Quality:  Appropriate, Attentive, and Sharing  Affect:  Blunted  Cognitive:  Alert  Insight:  Improving  Engagement in Group:  Engaged  Modes of Intervention:  Clarification, Education, and Support  Summary of Progress/Problems: Positive thinking and positive change were discussed.   Marcille Buffy 06/02/2021, 9:34 PM

## 2021-06-02 NOTE — [facility] Suicide Risk Assessment (Signed)
Suicide Risk Assessment  Admission Assessment    [facility] Admission Suicide Risk Assessment   Nursing information obtained from:    Demographic factors:  Male, Low socioeconomic status, Unemployed Current Mental Status:  NA Loss Factors:  Financial problems / change in socioeconomic status Historical Factors:  Prior suicide attempts Risk Reduction Factors:  Positive social support  Total Time spent with patient: 1 hour Principal Problem: Schizophrenia ([place]) Diagnosis:  Principal Problem:   Schizophrenia ([place]) Active Problems:   Anxiety state   Insomnia  History of Present Illness: Wesley Peterson is a 36 y.o [place] American male with a history of Schizophrenia who presented to the [facility] ER with complaints of worsening auditory hallucinations, passive SI & depressive symptoms in the context of medication non compliance and homelessness. Pt was transferred voluntarily to this [facility] for treatment and stabilization of his mood,  Continued Clinical Symptoms: paranoia, +auditory hallucinations, delusional thinking, and reports worsening depressive symptoms, reports feelings of hopelessness, helplessness, worthlessness, worsening anxiety, insomnia, poor appetite, decreased motivation, low energy levels & worsening feelings of paranoia. Current symptoms render pt a high risk of danger to self and continuous hospitalization is necessary to treat and stabilize symptoms.  Alcohol Use Disorder Identification Test Final Score (AUDIT): 0 The "Alcohol Use Disorders Identification Test", Guidelines for Use in Primary Care, Second Edition.  [employer] [facility]). Score between 0-7:  no or low risk or alcohol related problems. Score between 8-15:  moderate risk of alcohol related problems. Score between 16-19:  high risk of alcohol related problems. Score 20 or above:  warrants further diagnostic evaluation for alcohol dependence and treatment.  CLINICAL FACTORS:   Depression:    Anhedonia Delusional Hopelessness Insomnia Severe Schizophrenia:   Depressive state Less than 63 years old Paranoid or undifferentiated type  Musculoskeletal: Strength & Muscle Tone: within normal limits Gait & Station: normal Patient leans: N/A  Psychiatric Specialty Exam:  Presentation  General Appearance: Disheveled  Eye Contact:Minimal  Speech:Clear and Coherent  Speech Volume:Normal  Handedness:Right  Mood and Affect  Mood:Depressed  Affect:Congruent  Thought Process  Thought Processes:Coherent  Descriptions of Associations:Intact  Orientation:Full (Time, Place and Person)  Thought Content:Delusions; Illogical; Paranoid Ideation  History of Schizophrenia/Schizoaffective disorder:Yes  Duration of Psychotic Symptoms:Greater than six months  Hallucinations:Hallucinations: Auditory  Ideas of Reference:Delusions; Paranoia  Suicidal Thoughts:Suicidal Thoughts: No  Homicidal Thoughts:Homicidal Thoughts: No  Sensorium  Memory:Immediate Good  Judgment:Poor  Insight:Poor  Executive Functions  Concentration:Fair  Attention Span:Fair  [place]  Psychomotor Activity  Psychomotor Activity:Psychomotor Activity: Other (comment) (stiffness)  Assets  Assets:Communication Skills  Sleep  Sleep:Sleep: Poor  Physical Exam: Physical Exam HENT:     Head: Normocephalic.     Nose: Nose normal.  Eyes:     Pupils: Pupils are equal, round, and reactive to light.  Musculoskeletal:     Cervical back: Normal range of motion.  Neurological:     Mental Status: He is alert and oriented to person, place, and time.   Review of Systems  Constitutional: Negative.   HENT: Negative.    Eyes: Negative.   Respiratory: Negative.    Cardiovascular: Negative.   Gastrointestinal: Negative.   Genitourinary: Negative.   Musculoskeletal: Negative.   Skin: Negative.   Endo/Heme/Allergies: Negative.    Psychiatric/Behavioral:  Positive for depression and hallucinations. Negative for memory loss, substance abuse and suicidal ideas. The patient is nervous/anxious and has insomnia.   Blood pressure (!) 122/92, pulse 94, temperature 97.8 F (  36.6 C), temperature source Oral, resp. rate 18, height 6\' 1"  (1.854 m), weight 70.8 kg, SpO2 96 %. Body mass index is 20.58 kg/m.   COGNITIVE FEATURES THAT CONTRIBUTE TO RISK:  None    SUICIDE RISK:   Moderate:  Frequent suicidal ideation with limited intensity, and duration, some specificity in terms of plans, no associated intent, good self-control, limited dysphoria/symptomatology, some risk factors present, and identifiable protective factors, including available and accessible social support.  PLAN OF CARE:  Treatment Plan Summary: Daily contact with patient to assess and evaluate symptoms and progress in treatment and Medication management   Observation Level/Precautions:  15 minute checks  Laboratory:  Labs reviewed   Psychotherapy:  Unit Group sessions  Medications:  See [facility]  Consultations:  To be determined   Discharge Concerns:  Safety, medication compliance, mood stability  Estimated LOS: 5-7 days  Other:  N/A    Physician Treatment Plan for Primary Diagnosis: Schizophrenia ([place])   PLAN Safety and Monitoring: Voluntary admission to inpatient psychiatric unit for safety, stabilization and treatment Daily contact with patient to assess and evaluate symptoms and progress in treatment Patient's case to be discussed in multi-disciplinary team meeting Observation Level : q15 minute checks Vital signs: q12 hours Precautions: Safety, No Roommate due to paranoia   Long Term Goal(s): Improvement in symptoms so as ready for discharge   Short Term Goals: Ability to verbalize feelings will improve, Ability to disclose and discuss suicidal ideas, and Compliance with prescribed medications will improve   Diagnoses Principal Problem:    Schizophrenia ([place]) Active Problems:   Anxiety state   Insomnia   Medications Plan: Plan: 1. stop risperdal due to stiffness and h/o TD. 2. 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  3. start geodon 40 mg bid with meals. 4. change agitation protocol to zyprexa 5 mg tid prn PO and IM    -Start Geodon 40 mg BID with meals for psychosis -Start Agitation protocol as per MAR (Zyprexa PO/IM PRN) -Start Trazodone 50 mg nightly PRN for insomnia -Continue Hydroxyzine 25 mg PRN TID for anxiety   Other PRNS -Continue Tylenol 650 mg every 6 hours PRN for mild pain -Continue Maalox 30 mg every 4 hrs PRN for indigestion -Continue Milk of Magnesia as needed every 6 hrs for constipation   Discharge Planning: Social work and case management to assist with discharge planning and identification of hospital follow-up needs prior to discharge Estimated LOS: 5-7 days Discharge Concerns: Need to establish a safety plan; Medication compliance and effectiveness Discharge Goals: Return home with outpatient referrals for mental health follow-up including medication management/psychotherapy  I certify that inpatient services furnished can reasonably be expected to improve the patient's condition.   Nicholes Rough, NP 06/02/2021, 3:34 PM
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
## 2021-06-03 ENCOUNTER — Encounter (HOSPITAL_COMMUNITY): Payer: Self-pay

## 2021-06-03 LAB — RAPID URINE DRUG SCREEN, HOSP PERFORMED
Amphetamines: NOT DETECTED
Barbiturates: NOT DETECTED
Benzodiazepines: NOT DETECTED
Cocaine: NOT DETECTED
Opiates: NOT DETECTED
Tetrahydrocannabinol: NOT DETECTED

## 2021-06-03 LAB — PROLACTIN: Prolactin: 17.5 ng/mL — ABNORMAL HIGH (ref 4.0–15.2)

## 2021-06-03 NOTE — Group Note (Signed)
LCSW Group Therapy Note   Group Date: 06/03/2021 Start Time: 1300 End Time: 1400  Type of Therapy and Topic:  Group Therapy - Healthy vs Unhealthy Coping Skills  Participation Level:  Did Not Attend   Description of Group The focus of this group was to determine what unhealthy coping techniques typically are used by group members and what healthy coping techniques would be helpful in coping with various problems. Patients were guided in becoming aware of the differences between healthy and unhealthy coping techniques. Patients were asked to identify 2-3 healthy coping skills they would like to learn to use more effectively.  Therapeutic Goals Patients learned that coping is what human beings do all day long to deal with various situations in their lives Patients defined and discussed healthy vs unhealthy coping techniques Patients identified their preferred coping techniques and identified whether these were healthy or unhealthy Patients determined 2-3 healthy coping skills they would like to become more familiar with and use more often. Patients provided support and ideas to each other   Summary of Patient Progress:  Did not attend    Therapeutic Modalities Cognitive Behavioral Therapy Motivational Interviewing  Aram Beecham, [place] 06/03/2021  2:06 PM

## 2021-06-03 NOTE — Group Note (Signed)
Date:  06/03/2021 Time:  9:56 AM  Group Topic/Focus:  Orientation:   The focus of this group is to educate the patient on the purpose and policies of crisis stabilization and provide a format to answer questions about their admission.  The group details unit policies and expectations of patients while admitted.    Participation Level:  Did Not Attend  Participation Quality:    Affect:    Cognitive:    Insight:   Engagement in Group:    Modes of Intervention:    Additional Comments:    Jaquita Rector 06/03/2021, 9:56 AM

## 2021-06-03 NOTE — BH IP Treatment Plan (Addendum)
Interdisciplinary Treatment and Diagnostic Plan Update  06/03/2021 Time of Session: 1000 Wesley Peterson MRN: [id number]  Principal Diagnosis: Schizophrenia [facility])  Secondary Diagnoses: Principal Problem:   Schizophrenia (HCC) Active Problems:   Anxiety state   Insomnia   Current Medications:  Current Facility-Administered Medications  Medication Dose Route Frequency Provider Last Rate Last Admin   acetaminophen (TYLENOL) tablet 650 mg  650 mg Oral Q6H PRN Oneta Rack, NP       alum & mag hydroxide-simeth (MAALOX/MYLANTA) 200-200-20 MG/5ML suspension 30 mL  30 mL Oral Q4H PRN Oneta Rack, NP       hydrOXYzine (ATARAX) tablet 25 mg  25 mg Oral TID PRN Oneta Rack, NP   25 mg at 06/02/21 2101   magnesium hydroxide (MILK OF MAGNESIA) suspension 30 mL  30 mL Oral Daily PRN Oneta Rack, NP       OLANZapine (ZYPREXA) injection 5 mg  5 mg Intramuscular TID PRN Massengill, Harrold Donath, MD       OLANZapine (ZYPREXA) tablet 5 mg  5 mg Oral TID PRN Phineas Inches, MD       traZODone (DESYREL) tablet 50 mg  50 mg Oral QHS PRN Oneta Rack, NP   50 mg at 06/02/21 2102   ziprasidone (GEODON) capsule 40 mg  40 mg Oral BID WC Massengill, Harrold Donath, MD   40 mg at 06/03/21 2025   PTA Medications: Medications Prior to Admission  Medication Sig Dispense Refill Last Dose   benztropine (COGENTIN) 1 MG tablet Take 1 tablet (1 mg total) by mouth 2 (two) times daily as needed for tremors (Muscle stiffness and pain (EPS)). 60 tablet 0    FLUoxetine (PROZAC) 20 MG capsule Take 1 capsule (20 mg total) by mouth daily. 30 capsule 0    haloperidol decanoate (HALDOL DECANOATE) 100 MG/ML injection Inject 1 mL (100 mg total) into the muscle every 28 (twenty-eight) days for 28 days. 1 mL 0    melatonin 3 MG TABS tablet Take 1 tablet (3 mg total) by mouth at bedtime.  0    OLANZapine (ZYPREXA) 15 MG tablet Take 1 tablet (15 mg total) by mouth at bedtime. 30 tablet 0    risperiDONE (RISPERDAL) 3 MG tablet  Take 3 mg by mouth 2 (two) times daily.       Patient Stressors: Other: AVH    Patient Strengths: Motivation for treatment/growth  Supportive family/friends   Treatment Modalities: Medication Management, Group therapy, Case management,  1 to 1 session with clinician, Psychoeducation, Recreational therapy.   Physician Treatment Plan for Primary Diagnosis: Schizophrenia (HCC) Long Term Goal(s): Improvement in symptoms so as ready for discharge   Short Term Goals: Ability to verbalize feelings will improve Ability to disclose and discuss suicidal ideas Compliance with prescribed medications will improve  Medication Management: Evaluate patient's response, side effects, and tolerance of medication regimen.  Therapeutic Interventions: 1 to 1 sessions, Unit Group sessions and Medication administration.  Evaluation of Outcomes: Progressing  Physician Treatment Plan for Secondary Diagnosis: Principal Problem:   Schizophrenia (HCC) Active Problems:   Anxiety state   Insomnia  Long Term Goal(s): Improvement in symptoms so as ready for discharge   Short Term Goals: Ability to verbalize feelings will improve Ability to disclose and discuss suicidal ideas Compliance with prescribed medications will improve     Medication Management: Evaluate patient's response, side effects, and tolerance of medication regimen.  Therapeutic Interventions: 1 to 1 sessions, Unit Group sessions and Medication administration.  Evaluation of  Outcomes: Progressing   RN Treatment Plan for Primary Diagnosis: Schizophrenia (HCC) Long Term Goal(s): Knowledge of disease and therapeutic regimen to maintain health will improve  Short Term Goals: Ability to remain free from injury will improve, Ability to verbalize frustration and anger appropriately will improve, Ability to demonstrate self-control, Ability to participate in decision making will improve, Ability to verbalize feelings will improve, Ability to  disclose and discuss suicidal ideas, Ability to identify and develop effective coping behaviors will improve, and Compliance with prescribed medications will improve  Medication Management: RN will administer medications as ordered by provider, will assess and evaluate patient's response and provide education to patient for prescribed medication. RN will report any adverse and/or side effects to prescribing provider.  Therapeutic Interventions: 1 on 1 counseling sessions, Psychoeducation, Medication administration, Evaluate responses to treatment, Monitor vital signs and CBGs as ordered, Perform/monitor CIWA, COWS, AIMS and Fall Risk screenings as ordered, Perform wound care treatments as ordered.  Evaluation of Outcomes: Progressing   LCSW Treatment Plan for Primary Diagnosis: Schizophrenia (HCC) Long Term Goal(s): Safe transition to appropriate next level of care at discharge, Engage patient in therapeutic group addressing interpersonal concerns.  Short Term Goals: Engage patient in aftercare planning with referrals and resources, Increase social support, Increase ability to appropriately verbalize feelings, Increase emotional regulation, Facilitate acceptance of mental health diagnosis and concerns, Facilitate patient progression through stages of change regarding substance use diagnoses and concerns, Identify triggers associated with mental health/substance abuse issues, and Increase skills for wellness and recovery  Therapeutic Interventions: Assess for all discharge needs, 1 to 1 time with Social worker, Explore available resources and support systems, Assess for adequacy in community support network, Educate family and significant other(s) on suicide prevention, Complete Psychosocial Assessment, Interpersonal group therapy.  Evaluation of Outcomes: Progressing   Progress in Treatment: Attending groups: No. Participating in groups: No. Taking medication as prescribed: Yes. Toleration  medication: Yes. Family/Significant other contact made: No, will contact:  Patient declined for CSW to reach family/friend. CSW will reach out only once patient provides written consent.  Patient understands diagnosis: No. Discussing patient identified problems/goals with staff: Yes. Medical problems stabilized or resolved: Yes. Denies suicidal/homicidal ideation: Yes. Issues/concerns per patient self-inventory: Yes. Other: none   New problem(s) identified: No, Describe:  No additional problems/concerns identified at this time.   New Short Term/Long Term Goal(s): Patient to work towards detox, elimination of symptoms of psychosis, medication management for mood stabilization; development of comprehensive mental wellness/sobriety plan.  Patient Goals: States he would like to be able to "get rid of the voices"   Discharge Plan or Barriers: Patient is homeless, unsure of where he will live at discharge. Patient provided with homeless resources, CSW will continue to discuss housing with patient to determine appropriate discharge location.   Reason for Continuation of Hospitalization: Hallucinations  Estimated Length of Stay: 1-7 days   Last 3 [place] Suicide Severity Risk Score: Flowsheet Row [facility]30/2021 in [facility] Admission (Dis[facility]  C-SSRS RISK CATEGORY No Risk No Risk No Risk       Last PHQ 2/9 Scores:     View : No data to display.          Scribe for Treatment Team: Almedia Balls 06/03/2021 12:25 PM

## 2021-06-03 NOTE — Progress Notes (Signed)
   06/03/21 2214  Psych Admission Type (Psych Patients Only)  Admission Status Voluntary  Psychosocial Assessment  Patient Complaints None  Eye Contact Avoids  Facial Expression Flat  Affect Blunted  Speech Soft  Interaction No initiation;Isolative  Motor Activity Slow  Appearance/Hygiene Poor hygiene  Behavior Characteristics Calm  Mood Pleasant  Thought Process  Coherency WDL  Content Paranoia  Delusions UTA  Perception Hallucinations  Hallucination Auditory  Judgment Poor  Confusion None  Danger to Self  Current suicidal ideation? Denies  Danger to Others  Danger to Others None reported or observed

## 2021-06-03 NOTE — Progress Notes (Signed)
     06/02/21 2100  Psych Admission Type (Psych Patients Only)  Admission Status Voluntary  Psychosocial Assessment  Patient Complaints Anxiety  Eye Contact Poor  Facial Expression Flat;Sad  Affect Anxious  Speech Soft;Logical/coherent  Interaction Cautious;Guarded;Minimal  Motor Activity Slow  Appearance/Hygiene Disheveled  Behavior Characteristics Anxious  Mood Anxious  Thought Process  Coherency WDL  Content Paranoia  Delusions Paranoid  Perception Hallucinations  Hallucination Auditory  Judgment Limited  Confusion None  Danger to Self  Current suicidal ideation? Denies  Danger to Others  Danger to Others None reported or observed

## 2021-06-03 NOTE — Group Note (Signed)
Recreation Therapy Group Note   Group Topic:Stress Management  Group Date: 06/03/2021 Start Time: 0930 End Time: 0945 Facilitators: Victorino Sparrow, [place] Location: [address] Day[address]   Goal Area(s) Addresses:  Patient will identify positive stress management techniques. Patient will identify benefits of using stress management post d/c.  Group Description:  Meditation.  LRT explained the stress management technique of meditation to patients.  LRT then played a meditation that focused on bringing positive energy into your day through positive self talk.  Patients were to listen as meditation played and quietly repeat the affirmations to themselves to get the full affect of the exercise.  At conclusion, LRT made suggestions of where patients could find more meditations and other stress management techniques.   Affect/Mood: N/A   Participation Level: Did not attend    Clinical Observations/Individualized Feedback:     Plan: Continue to engage patient in RT group sessions 2-3x/week.   Victorino Sparrow, Glennis Brink 06/03/2021 12:33 PM

## 2021-06-03 NOTE — Plan of Care (Signed)
  Problem: Education: Goal: Will be free of psychotic symptoms Outcome: Not Progressing   Problem: Coping: Goal: Coping ability will improve Outcome: Not Progressing Goal: Will verbalize feelings Outcome: Not Progressing

## 2021-06-03 NOTE — Progress Notes (Signed)
[facility] MD Progress Note  06/03/2021 2:04 PM Wesley Peterson  MRN:  [id number]  Subjective:  Wesley Peterson reports: "I am still hearing the voices, and they are moaning."  Reason For Admission: Wesley Peterson is a 36 y.o [place] American male with a history of Schizophrenia who presented to the [facility] ER with complaints of worsening auditory hallucinations, passive SI & depressive symptoms in the context of medication non compliance and homelessness. Pt was transferred voluntarily to this  [facility] for treatment and stabilization of his mood.  Today's patient assessment Note: Pt's chart reviewed, his case discussed with his treatment team. Pt with flat affect and depressed mood, attention to personal hygiene and grooming is poor, eye contact is poor, speech is clear & coherent. Thought contents are organized, but with some illogical contents. Pt continues to present with paranoia and delusional thinking. He reports that there are people out to get him, and presents with thought broadcasting & thought insertion. He reports that his thoughts are so loud that other people know what he is thinking, and states that people take thoughts out of his brain and put some thoughts back into his brain. Pt denies SI/HI, endorses +AH of moaning voices, denies VH.  He reports that his sleep quality last night was fair, and that his appetite is fair. Pt is resistant to coming to the medication window for his medications, but staff has been taking his medications to him, and he has been taking them. Will continue Geodon 40 mg BID, and plan is to transition pt to a long acting injectable medication prior to discharge.   Labs Reviewed, prolactin is currently elevated at 17.5, but pt was switched from Risperdal to Geodon yesterday. EKG with QTC of 438, and elevated T waves with early repolarization.   Principal Problem: Schizophrenia ([place]) Diagnosis: Principal Problem:   Schizophrenia ([place]) Active Problems:   Anxiety state    Insomnia  Total Time spent with patient: 30 minutes  Past Psychiatric History: As above  Past Medical History:  Past Medical History:  Diagnosis Date   Schizophrenia ([place])    Schizophrenia, paranoid type ([place]) 02/02/2021   History reviewed. No pertinent surgical history. Family History: History reviewed. No pertinent family history. Family Psychiatric  History: none reported Social History:  Social History   Substance and Sexual Activity  Alcohol Use Not Currently   Alcohol/week: 1.0 standard drink   Types: 1 Cans of beer per week   Comment: occasional     Social History   Substance and Sexual Activity  Drug Use Never    Social History   Socioeconomic History   Marital status: Single    Spouse name: Not on file   Number of children: 1   Years of education: Not on file   Highest education level: Not on file  Occupational History   Occupation: Unemployed  Tobacco Use   Smoking status: Never   Smokeless tobacco: Never  Vaping Use   Vaping Use: Never used  Substance and Sexual Activity   Alcohol use: Not Currently    Alcohol/week: 1.0 standard drink    Types: 1 Cans of beer per week    Comment: occasional   Drug use: Never   Sexual activity: Never  Other Topics Concern   Not on file  Social History Narrative   ** Merged History Encounter **       Pt lives with mother and other relatives.  Currently unemployed.  [place] outpatient psychiatry services through [facility].   Social  Determinants of Health   Financial Resource Strain: Not on file  Food Insecurity: Not on file  Transportation Needs: Not on file  Physical Activity: Not on file  Stress: Not on file  Social Connections: Not on file   Additional Social History:   Sleep: Good  Appetite:  Good  Current Medications: Current Facility-Administered Medications  Medication Dose Route Frequency Provider Last Rate Last Admin   acetaminophen (TYLENOL) tablet 650 mg  650 mg Oral Q6H PRN Derrill Center,  NP       alum & mag hydroxide-simeth (MAALOX/MYLANTA) 200-200-20 MG/5ML suspension 30 mL  30 mL Oral Q4H PRN Derrill Center, NP       hydrOXYzine (ATARAX) tablet 25 mg  25 mg Oral TID PRN Derrill Center, NP   25 mg at 06/02/21 2101   magnesium hydroxide (MILK OF MAGNESIA) suspension 30 mL  30 mL Oral Daily PRN Derrill Center, NP       OLANZapine (ZYPREXA) injection 5 mg  5 mg Intramuscular TID PRN Massengill, Ovid Curd, MD       OLANZapine (ZYPREXA) tablet 5 mg  5 mg Oral TID PRN Massengill, Ovid Curd, MD       traZODone (DESYREL) tablet 50 mg  50 mg Oral QHS PRN Derrill Center, NP   50 mg at 06/02/21 2102   ziprasidone (GEODON) capsule 40 mg  40 mg Oral BID WC Massengill, Ovid Curd, MD   40 mg at 06/03/21 [id number]    Lab Results:  Results for orders placed or performed during the hospital encounter of 06/01/21 (from the past 48 hour(s))  TSH     Status: None   Collection Time: 06/02/21  6:27 AM  Result Value Ref Range   TSH 1.354 0.350 - 4.500 uIU/mL    Comment: Performed by a 3rd Generation assay with a functional sensitivity of <=0.01 uIU/mL. Performed at [facility], [place] [address], [place], [place] [address]   Prolactin     Status: Abnormal   Collection Time: 06/02/21  6:27 AM  Result Value Ref Range   Prolactin 17.5 (H) 4.0 - 15.2 ng/mL    Comment: (NOTE) Performed At: [facility] [place], [place] [id number] Rush Farmer MD [id number]   Hemoglobin A1c     Status: Abnormal   Collection Time: 06/02/21  6:27 AM  Result Value Ref Range   Hgb A1c MFr Bld 5.9 (H) 4.8 - 5.6 %    Comment: (NOTE) Pre diabetes:          5.7%-6.4%  Diabetes:              >6.4%  Glycemic control for   <7.0% adults with diabetes    Mean Plasma Glucose 122.63 mg/dL    Comment: Performed at [facility], [place] [address], [place], [place] [address]  Lipid panel     Status: Abnormal   Collection Time: 06/02/21  6:27 AM  Result Value Ref Range    Cholesterol 137 0 - 200 mg/dL   Triglycerides 105 <150 mg/dL   HDL 36 (L) >40 mg/dL   Total CHOL/HDL Ratio 3.8 RATIO   VLDL 21 0 - 40 mg/dL   LDL Cholesterol 80 0 - 99 mg/dL    Comment:        Total Cholesterol/HDL:CHD Risk Coronary Heart Disease Risk Table                     Men   Women  1/2 Average Risk   3.4   3.3  Average Risk       5.0   4.4  2 X Average Risk   9.6   7.1  3 X Average Risk  23.4   11.0        Use the calculated Patient Ratio above and the CHD Risk Table to determine the patient's CHD Risk.        ATP III CLASSIFICATION (LDL):  <100     mg/dL   Optimal  100-129  mg/dL   Near or Above                    Optimal  130-159  mg/dL   Borderline  160-189  mg/dL   High  >190     mg/dL   Very High Performed at [place] [address] [place], [place] [address]   Rapid urine drug screen (hospital performed)     Status: None   Collection Time: 06/03/21  6:32 AM  Result Value Ref Range   Opiates NONE DETECTED NONE DETECTED   Cocaine NONE DETECTED NONE DETECTED   Benzodiazepines NONE DETECTED NONE DETECTED   Amphetamines NONE DETECTED NONE DETECTED   Tetrahydrocannabinol NONE DETECTED NONE DETECTED   Barbiturates NONE DETECTED NONE DETECTED    Comment: (NOTE) DRUG SCREEN FOR MEDICAL PURPOSES ONLY.  IF CONFIRMATION IS NEEDED FOR ANY PURPOSE, NOTIFY LAB WITHIN 5 DAYS.  LOWEST DETECTABLE LIMITS FOR URINE DRUG SCREEN Drug Class                     Cutoff (ng/mL) Amphetamine and metabolites    1000 Barbiturate and metabolites    200 Benzodiazepine                 [id number] Tricyclics and metabolites     300 Opiates and metabolites        300 Cocaine and metabolites        300 THC                            50 Performed at [facility], [place] [address], [place], [place] [address]     Blood Alcohol level:  Lab Results  Component Value Date   [facility] <10 05/30/2021   ETH <10 [id number]    Metabolic Disorder Labs: Lab  Results  Component Value Date   HGBA1C 5.9 (H) 06/02/2021   MPG 122.63 06/02/2021   MPG 126 02/03/2021   Lab Results  Component Value Date   PROLACTIN 17.5 (H) 06/02/2021   PROLACTIN 23.4 (H) 11/28/2018   Lab Results  Component Value Date   CHOL 137 06/02/2021   TRIG 105 06/02/2021   HDL 36 (L) 06/02/2021   CHOLHDL 3.8 06/02/2021   VLDL 21 06/02/2021   LDLCALC 80 06/02/2021   LDLCALC 80 01/30/2021    Physical Findings: AIMS: Facial and Oral Movements Muscles of Facial Expression: None, normal Lips and Perioral Area: None, normal Jaw: None, normal Tongue: None, normal,Extremity Movements Upper (arms, wrists, hands, fingers): None, normal Lower (legs, knees, ankles, toes): None, normal, Trunk Movements Neck, shoulders, hips: None, normal, Overall Severity Severity of abnormal movements (highest score from questions above): None, normal Incapacitation due to abnormal movements: None, normal Patient's awareness of abnormal movements (rate only patient's report): No Awareness, Dental Status Current problems with teeth and/or dentures?: Yes Does patient usually wear dentures?: No  CIWA:  COWS:     Musculoskeletal: Strength & Muscle Tone: within normal limits Gait & Station: normal Patient leans: N/A  Psychiatric Specialty Exam:  Presentation  General Appearance: Disheveled  Eye Contact:Fair  Speech:Clear and Coherent  Speech Volume:Normal  Handedness:Right   Mood and Affect  Mood:Depressed  Affect:Congruent   Thought Process  Thought Processes:Coherent  Descriptions of Associations:Intact  Orientation:Full (Time, Place and Person)  Thought Content:Illogical  History of Schizophrenia/Schizoaffective disorder:Yes  Duration of Psychotic Symptoms:Greater than six months  Hallucinations:Hallucinations: Auditory Description of Auditory Hallucinations: moaning voices  Ideas of Reference:Paranoia; Delusions  Suicidal Thoughts:Suicidal Thoughts:  No  Homicidal Thoughts:Homicidal Thoughts: No   Sensorium  Memory:Immediate Good  Judgment:Poor  Insight:Poor   Executive Functions  Concentration:Fair  Attention Span:Fair  [place]  Psychomotor Activity  Psychomotor Activity:Psychomotor Activity: Normal  Assets  Assets:Communication Skills  Sleep  Sleep:Sleep: Fair  Physical Exam: Physical Exam Constitutional:      Appearance: Normal appearance.  HENT:     Head: Normocephalic.     Nose: Nose normal. No congestion or rhinorrhea.  Eyes:     Pupils: Pupils are equal, round, and reactive to light.  Pulmonary:     Effort: Pulmonary effort is normal.  Musculoskeletal:        General: Normal range of motion.     Cervical back: Normal range of motion.  Neurological:     General: No focal deficit present.     Mental Status: He is alert and oriented to person, place, and time.   Review of Systems  Constitutional: Negative.  Negative for fever.  HENT: Negative.  Negative for sore throat.   Eyes: Negative.   Respiratory: Negative.  Negative for cough.   Cardiovascular: Negative.   Gastrointestinal: Negative.   Genitourinary: Negative.   Musculoskeletal: Negative.   Skin: Negative.   Neurological: Negative.   Psychiatric/Behavioral:  Positive for depression and hallucinations. Negative for memory loss, substance abuse and suicidal ideas. The patient is not nervous/anxious and does not have insomnia.   Blood pressure 126/86, pulse 100, temperature 97.8 F (36.6 C), temperature source Oral, resp. rate 18, height 6\' 1"  (1.854 m), weight 70.8 kg, SpO2 96 %. Body mass index is 20.58 kg/m.  Treatment Plan Summary: Daily contact with patient to assess and evaluate symptoms and progress in treatment and Medication management   Observation Level/Precautions:  15 minute checks  Laboratory:  Labs reviewed   Psychotherapy:  Unit Group sessions  Medications:  See [facility]   Consultations:  To be determined   Discharge Concerns:  Safety, medication compliance, mood stability  Estimated LOS: 5-7 days  Other:  N/A    Physician Treatment Plan for Primary Diagnosis: Schizophrenia ([place])   PLAN Safety and Monitoring: Voluntary admission to inpatient psychiatric unit for safety, stabilization and treatment Daily contact with patient to assess and evaluate symptoms and progress in treatment Patient's case to be discussed in multi-disciplinary team meeting Observation Level : q15 minute checks Vital signs: q12 hours Precautions: Safety, No Roommate due to paranoia   Long Term Goal(s): Improvement in symptoms so as ready for discharge   Short Term Goals: Ability to verbalize feelings will improve, Ability to disclose and discuss suicidal ideas, and Compliance with prescribed medications will improve   Diagnoses Principal Problem:   Schizophrenia ([place]) Active Problems:   Anxiety state   Insomnia   Medications Plan: Plan: 1. stop risperdal due to stiffness and h/o TD. 2. 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  3.  start geodon 40 mg bid with meals. 4. change agitation protocol to zyprexa 5 mg tid prn PO and IM    -Continue Geodon 40 mg BID with meals for psychosis -Continue Agitation protocol as per MAR (Zyprexa PO/IM PRN) -Continue Trazodone 50 mg nightly PRN for insomnia -Continue Hydroxyzine 25 mg PRN TID for anxiety   Other PRNS -Continue Tylenol 650 mg every 6 hours PRN for mild pain -Continue Maalox 30 mg every 4 hrs PRN for indigestion -Continue Milk of Magnesia as needed every 6 hrs for constipation   Discharge Planning: Social work and case management to assist with discharge planning and identification of hospital follow-up needs prior to discharge Estimated LOS: 5-7 days Discharge Concerns: Need to establish a safety plan; Medication compliance and effectiveness Discharge Goals: Return home with outpatient referrals for mental health follow-up including  medication management/psychotherapy  Nicholes Rough, NP 06/03/2021, 2:04 PM

## 2021-06-03 NOTE — Progress Notes (Signed)
Patient did not attend wrap up group or AA group.

## 2021-06-04 MED ORDER — TRAZODONE HCL 100 MG PO TABS
100.0000 mg | ORAL_TABLET | Freq: Every day | ORAL | Status: DC
  Administered 2021-06-04: 100 mg via ORAL
  Filled 2021-06-04 (×4): qty 1

## 2021-06-04 MED ORDER — ZIPRASIDONE HCL 60 MG PO CAPS
60.0000 mg | ORAL_CAPSULE | Freq: Two times a day (BID) | ORAL | Status: DC
  Administered 2021-06-05 – 2021-06-06 (×3): 60 mg via ORAL
  Filled 2021-06-04 (×8): qty 1

## 2021-06-04 MED ORDER — BENZTROPINE MESYLATE 0.5 MG PO TABS
0.5000 mg | ORAL_TABLET | Freq: Two times a day (BID) | ORAL | Status: DC | PRN
Start: 2021-06-04 — End: 2021-06-06

## 2021-06-04 NOTE — [facility] Group Notes (Signed)
Psychoeducational Group Note  Date: 06/04/2021 Time: 0900-1000    Goal Setting   Purpose of Group: This group helps to provide patients with the steps of setting a goal that is specific, measurable, attainable, realistic and time specific. A discussion on how we keep ourselves stuck with negative self talk. Homework given for Patients to write 30 positive attributes about themselves.    Participation Level:  did not attend  Chrystel Barefield A 

## 2021-06-04 NOTE — Progress Notes (Signed)
Pt guarded on approach and does not make good eye contact when speaking with RN.   Pt responses with "Yes or No" responses given to questions asked of him.  Pt took medications without incident.  RN will continue to monitor and provide support as needed.

## 2021-06-04 NOTE — Progress Notes (Signed)
Adult Psychoeducational Group Note  Date:  06/04/2021 Time:  8:58 PM  Group Topic/Focus:  Wrap-Up Group:   The focus of this group is to help patients review their daily goal of treatment and discuss progress on daily workbooks.  Participation Level:  Did Not Attend  Participation Quality:  Did Not Attend  Affect:  Did Not Attend  Cognitive:  Did Not Attend  Insight: None  Engagement in Group:  Did Not Attend  Modes of Intervention:  Did Not Attend  Additional Comments:   Pt was encouraged to attend group but refused   Gerhard Perches 06/04/2021, 8:58 PM

## 2021-06-04 NOTE — Group Note (Signed)
LCSW Group Therapy Note  Group Date: 06/04/2021 Start Time: 1000 End Time: 1100   Type of Therapy and Topic:  Group Therapy: Anger Cues and Responses  Participation Level:  Did Not Attend   Description of Group:   In this group, patients learned how to recognize the physical, cognitive, emotional, and behavioral responses they have to anger-provoking situations.  They identified a recent time they became angry and how they reacted.  They analyzed how their reaction was possibly beneficial and how it was possibly unhelpful.  The group discussed a variety of healthier coping skills that could help with such a situation in the future.  Focus was placed on how helpful it is to recognize the underlying emotions to our anger, because working on those can lead to a more permanent solution as well as our ability to focus on the important rather than the urgent.  Therapeutic Goals: Patients will remember their last incident of anger and how they felt emotionally and physically, what their thoughts were at the time, and how they behaved. Patients will identify how their behavior at that time worked for them, as well as how it worked against them. Patients will explore possible new behaviors to use in future anger situations. Patients will learn that anger itself is normal and cannot be eliminated, and that healthier reactions can assist with resolving conflict rather than worsening situations.  Summary of Patient Progress:  Did not attend  Therapeutic Modalities:   Cognitive Behavioral Therapy    Lynnell Chad, LCSWA 06/04/2021  1:01 PM

## 2021-06-04 NOTE — Progress Notes (Signed)
   06/04/21 0800  Psych Admission Type (Psych Patients Only)  Admission Status Voluntary  Psychosocial Assessment  Patient Complaints None  Eye Contact Avoids  Facial Expression Flat  Affect Blunted  Speech Soft  Interaction No initiation;Isolative  Motor Activity Slow  Appearance/Hygiene Poor hygiene  Behavior Characteristics Calm  Mood Pleasant  Thought Process  Coherency WDL  Content Paranoia  Delusions UTA  Perception Hallucinations  Hallucination Auditory  Judgment Poor  Confusion None  Danger to Self  Current suicidal ideation? Denies  Danger to Others  Danger to Others None reported or observed

## 2021-06-04 NOTE — [facility] Group Notes (Signed)
.  Psychoeducational Group Note    Date:  5/27//23 Time: 1300-1400    Purpose of Group: . The group focus' on teaching patients on how to identify their needs and their Life Skills:  A group where two lists are made. What people need and what are things that we do that are unhealthy. The lists are developed by the patients and it is explained that we often do the actions that are not healthy to get our list of needs met.  Goal:: to develop the coping skills needed to get their needs met  Participation Level:  did not attend  Wesley Peterson

## 2021-06-04 NOTE — Progress Notes (Addendum)
[facility] MD Progress Note  06/04/2021 5:51 PM Kaide Gage  MRN:  [id number]  Subjective:  Lanard reports: "I had trouble staying asleep last night..I am still hearing the voices."  Reason For Admission: Ridge Lafond is a 36 y.o [place] American male with a history of Schizophrenia who presented to the [facility] ER with complaints of worsening auditory hallucinations, passive SI & depressive symptoms in the context of medication non compliance and homelessness. Pt was transferred voluntarily to this [facility] for treatment and stabilization of his mood.  Today's patient assessment Note: Pt's chart reviewed, his case discussed with his treatment team. Pt with flat affect and depressed mood, attention to personal hygiene and grooming is poor, eye contact is fair, speech is clear & coherent. Thought contents are organized, but with some illogical contents. Pt continues to present with paranoia, delusional thinking. He also presents with thought insertion, thought withdrawal, and thought broadcasting. He reports that random people are out to get him, reports that he doesn't know who they are. He reports that his thoughts are so loud that other people know what he is thinking about, reports that people put thoughts in his brain, and put some thoughts into his brains. He reports that random people do this. He also reports still hearing voices, states that the voices are still moaning in nature and non commanding. He denies visual hallucinations.  Pt is making improvements, as he is now able to get out of bed and go to the medications window for his medications, rather than staff taking the medications to him.  Pt reports appetite as being fair, reports sleep as fair, but states that he had a difficult time staying asleep last night. Will increase Trazodone to 100 mg nightly to help with insomnia. Will increase dose of Geodon to 60 mg BID effective tomorrow morning pending results of new EKG.  Labs Reviewed,  prolactin is currently elevated at 17.5, but pt was switched from Risperdal to Geodon yesterday. EKG with QTC of 438, and elevated T waves with early repolarization. Reviewed CMP, CBC, lipid panel, Hemoglobin A1C & TSH. 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 Will need to educated on this as mental status becomes more stable & will need PCP f/u on discharge.   Principal Problem: Schizophrenia (HCC) Diagnosis: Principal Problem:   Schizophrenia (HCC) Active Problems:   Anxiety state   Insomnia  Total Time spent with patient: 30 minutes  Past Psychiatric History: As above  Past Medical History:  Past Medical History:  Diagnosis Date   Schizophrenia (HCC)    Schizophrenia, paranoid type (HCC) 02/02/2021   History reviewed. No pertinent surgical history. Family History: History reviewed. No pertinent family history. Family Psychiatric  History: none reported Social History:  Social History   Substance and Sexual Activity  Alcohol Use Not Currently   Alcohol/week: 1.0 standard drink   Types: 1 Cans of beer per week   Comment: occasional     Social History   Substance and Sexual Activity  Drug Use Never    Social History   Socioeconomic History   Marital status: Single    Spouse name: Not on file   Number of children: 1   Years of education: Not on file   Highest education level: Not on file  Occupational History   Occupation: Unemployed  Tobacco Use   Smoking status: Never   Smokeless tobacco: Never  Vaping Use   Vaping Use: Never used  Substance and Sexual Activity   Alcohol  use: Not Currently    Alcohol/week: 1.0 standard drink    Types: 1 Cans of beer per week    Comment: occasional   Drug use: Never   Sexual activity: Never  Other Topics Concern   Not on file  Social History Narrative   ** Merged History Encounter **       Pt lives with mother and other relatives.  Currently unemployed.  Receives outpatient psychiatry services through  [facility].   Social Determinants of Health   Financial Resource Strain: Not on file  Food Insecurity: Not on file  Transportation Needs: Not on file  Physical Activity: Not on file  Stress: Not on file  Social Connections: Not on file   Additional Social History:   Sleep: Good  Appetite:  Good  Current Medications: Current Facility-Administered Medications  Medication Dose Route Frequency Provider Last Rate Last Admin   acetaminophen (TYLENOL) tablet 650 mg  650 mg Oral Q6H PRN Oneta RackLewis, Tanika N, NP   650 mg at 06/04/21 0826   alum & mag hydroxide-simeth (MAALOX/MYLANTA) 200-200-20 MG/5ML suspension 30 mL  30 mL Oral Q4H PRN Oneta RackLewis, Tanika N, NP       hydrOXYzine (ATARAX) tablet 25 mg  25 mg Oral TID PRN Oneta RackLewis, Tanika N, NP   25 mg at 06/04/21 0817   magnesium hydroxide (MILK OF MAGNESIA) suspension 30 mL  30 mL Oral Daily PRN Oneta RackLewis, Tanika N, NP       OLANZapine (ZYPREXA) injection 5 mg  5 mg Intramuscular TID PRN Massengill, Harrold DonathNathan, MD       OLANZapine (ZYPREXA) tablet 5 mg  5 mg Oral TID PRN Phineas InchesMassengill, Nathan, MD       traZODone (DESYREL) tablet 100 mg  100 mg Oral QHS Starleen BlueNkwenti, Doris, NP       [START ON 06/05/2021] ziprasidone (GEODON) capsule 60 mg  60 mg Oral BID WC Nkwenti, Doris, NP        Lab Results:  Results for orders placed or performed during the hospital encounter of 06/01/21 (from the past 48 hour(s))  Rapid urine drug screen (hospital performed)     Status: None   Collection Time: 06/03/21  6:32 AM  Result Value Ref Range   Opiates NONE DETECTED NONE DETECTED   Cocaine NONE DETECTED NONE DETECTED   Benzodiazepines NONE DETECTED NONE DETECTED   Amphetamines NONE DETECTED NONE DETECTED   Tetrahydrocannabinol NONE DETECTED NONE DETECTED   Barbiturates NONE DETECTED NONE DETECTED    Comment: (NOTE) DRUG SCREEN FOR MEDICAL PURPOSES ONLY.  IF CONFIRMATION IS NEEDED FOR ANY PURPOSE, NOTIFY LAB WITHIN 5 DAYS.  LOWEST DETECTABLE LIMITS FOR URINE DRUG SCREEN Drug  Class                     Cutoff (ng/mL) Amphetamine and metabolites    1000 Barbiturate and metabolites    200 Benzodiazepine                 200 Tricyclics and metabolites     300 Opiates and metabolites        300 Cocaine and metabolites        300 THC                            50 Performed at [facility], [address] [place], [place] [address]     Blood Alcohol level:  Lab Results  Component Value Date   ETH <  10 05/30/2021   ETH <10 02/02/2021    Metabolic Disorder Labs: Lab Results  Component Value Date   HGBA1C 5.9 (H) 06/02/2021   MPG 122.63 06/02/2021   MPG 126 02/03/2021   Lab Results  Component Value Date   PROLACTIN 17.5 (H) 06/02/2021   PROLACTIN 23.4 (H) 11/28/2018   Lab Results  Component Value Date   CHOL 137 06/02/2021   TRIG 105 06/02/2021   HDL 36 (L) 06/02/2021   CHOLHDL 3.8 06/02/2021   VLDL 21 06/02/2021   LDLCALC 80 06/02/2021   LDLCALC 80 01/30/2021    Physical Findings: AIMS: Facial and Oral Movements Muscles of Facial Expression: None, normal Lips and Perioral Area: None, normal Jaw: None, normal Tongue: None, normal,Extremity Movements Upper (arms, wrists, hands, fingers): None, normal Lower (legs, knees, ankles, toes): None, normal, Trunk Movements Neck, shoulders, hips: None, normal, Overall Severity Severity of abnormal movements (highest score from questions above): None, normal Incapacitation due to abnormal movements: None, normal Patient's awareness of abnormal movements (rate only patient's report): No Awareness, Dental Status Current problems with teeth and/or dentures?: Yes Does patient usually wear dentures?: No  CIWA:    COWS:     Musculoskeletal: Strength & Muscle Tone: within normal limits Gait & Station: normal Patient leans: N/A  Psychiatric Specialty Exam:  Presentation  General Appearance: Appropriate for Environment; Fairly Groomed  Eye Contact:Fair  Speech:Clear and  Coherent  Speech Volume:Normal  Handedness:Right   Mood and Affect  Mood:Depressed  Affect:Congruent   Thought Process  Thought Processes:Coherent  Descriptions of Associations:Intact  Orientation:Full (Time, Place and Person)  Thought Content:Illogical  History of Schizophrenia/Schizoaffective disorder:Yes  Duration of Psychotic Symptoms:Greater than six months  Hallucinations:Hallucinations: Auditory Description of Auditory Hallucinations: moaning voices  Ideas of Reference:Paranoia; Delusions  Suicidal Thoughts:Suicidal Thoughts: No  Homicidal Thoughts:Homicidal Thoughts: No   Sensorium  Memory:Immediate Good  Judgment:Poor  Insight:Poor   Executive Functions  Concentration:Fair  Attention Span:Fair  Recall:Fair  Fund of Knowledge:Fair  Language:Fair  Psychomotor Activity  Psychomotor Activity:Psychomotor Activity: Normal  Assets  Assets:Communication Skills  Sleep  Sleep:Sleep: Fair  Physical Exam: Physical Exam Constitutional:      Appearance: Normal appearance.  HENT:     Head: Normocephalic.     Nose: Nose normal. No congestion or rhinorrhea.  Eyes:     Pupils: Pupils are equal, round, and reactive to light.  Pulmonary:     Effort: Pulmonary effort is normal.  Musculoskeletal:        General: Normal range of motion.     Cervical back: Normal range of motion.  Neurological:     General: No focal deficit present.     Mental Status: He is alert and oriented to person, place, and time.   Review of Systems  Constitutional: Negative.  Negative for fever.  HENT: Negative.  Negative for sore throat.   Eyes: Negative.   Respiratory: Negative.  Negative for cough.   Cardiovascular: Negative.   Gastrointestinal: Negative.   Genitourinary: Negative.   Musculoskeletal: Negative.   Skin: Negative.   Neurological: Negative.   Psychiatric/Behavioral:  Positive for depression and hallucinations. Negative for memory loss, substance  abuse and suicidal ideas. The patient is not nervous/anxious and does not have insomnia.   Blood pressure (!) 113/93, pulse 83, temperature 98.7 F (37.1 C), temperature source Oral, resp. rate 18, height 6\' 1"  (1.854 m), weight 70.8 kg, SpO2 99 %. Body mass index is 20.58 kg/m.  Treatment Plan Summary: Daily contact with patient to assess and  evaluate symptoms and progress in treatment and Medication management   Observation Level/Precautions:  15 minute checks  Laboratory:  Labs reviewed   Psychotherapy:  Unit Group sessions  Medications:  See [facility]  Consultations:  To be determined   Discharge Concerns:  Safety, medication compliance, mood stability  Estimated LOS: 5-7 days  Other:  N/A    Physician Treatment Plan for Primary Diagnosis: Schizophrenia (HCC)   PLAN Safety and Monitoring: Voluntary admission to inpatient psychiatric unit for safety, stabilization and treatment Daily contact with patient to assess and evaluate symptoms and progress in treatment Patient's case to be discussed in multi-disciplinary team meeting Observation Level : q15 minute checks Vital signs: q12 hours Precautions: Safety, No Roommate due to paranoia   Long Term Goal(s): Improvement in symptoms so as ready for discharge   Short Term Goals: Ability to verbalize feelings will improve, Ability to disclose and discuss suicidal ideas, and Compliance with prescribed medications will improve   Diagnoses Principal Problem:   Schizophrenia (HCC) Active Problems:   Anxiety state   Insomnia   Medications Plan: Plan: 1. stop risperdal due to stiffness and h/o TD. 2. 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  3. start geodon 40 mg bid with meals. 4. change agitation protocol to zyprexa 5 mg tid prn PO and IM    -Increase Geodon to 60 mg BID with meals for psychosis starting tomorrow - repeat EKG pending for QTC monitoring with dose increase -Continue Agitation protocol as per MAR (Zyprexa PO/IM PRN) -Increase Trazodone  to 100 mg nightly PRN for insomnia -Continue Hydroxyzine 25 mg PRN TID for anxiety   Other PRNS -Continue Tylenol 650 mg every 6 hours PRN for mild pain -Continue Maalox 30 mg every 4 hrs PRN for indigestion -Continue Milk of Magnesia as needed every 6 hrs for constipation   Discharge Planning: Social work and case management to assist with discharge planning and identification of hospital follow-up needs prior to discharge Estimated LOS: 5-7 days Discharge Concerns: Need to establish a safety plan; Medication compliance and effectiveness Discharge Goals: Return home with outpatient referrals for mental health follow-up including medication management/psychotherapy  Starleen Blue, NP 06/04/2021, 5:51 PM Patient ID: Samin Milke, male   DOB: [birth year], 36 y.o.   MRN: [id number]

## 2021-06-05 MED ORDER — MELATONIN 3 MG PO TABS
3.0000 mg | ORAL_TABLET | Freq: Every day | ORAL | Status: DC
  Administered 2021-06-05 – 2021-06-09 (×5): 3 mg via ORAL
  Filled 2021-06-05 (×8): qty 1

## 2021-06-05 MED ORDER — TRAZODONE HCL 100 MG PO TABS
100.0000 mg | ORAL_TABLET | Freq: Every day | ORAL | Status: DC
  Administered 2021-06-05 – 2021-06-09 (×5): 100 mg via ORAL
  Filled 2021-06-05 (×7): qty 1

## 2021-06-05 NOTE — Progress Notes (Signed)
[facility]  06/05/2021 2:16 PM Wesley Peterson Peterson  MRN:  [id number]  Subjective:  Wesley Peterson reports: "I hear the voices, and they are the same. They are moaning and saying his and hers."  Reason For Admission: Wesley Peterson is a 36 y.o [place] male with a history of Schizophrenia who presented to the [facility] ER with complaints of worsening auditory hallucinations, passive SI & depressive symptoms in the context of medication non compliance and homelessness. Pt [facility] and stabilization of his mood.  Today's patient assessment Note: Pt's chart reviewed, his case discussed with his treatment team. Pt continues to present with a flat affect and depressed mood. His attention to personal hygiene and grooming is poor, eye contact is fair, speech is clear & coherent. Thought contents are organized, but still with some illogical contents. Pt continues to present with paranoia & delusional thinking. He continues to think that nonspecific people are out to get him. He reports that other people know what he is thinking, and states that people put thoughts in his brain and take thoughts out of his brain. He reports +AH of voices saying "his", "hers", and also moaning. He denies VH, and denies SI/HI/AVH.  Pt reports sleep quality last night as being poor even though Trazodone was increased to 100 mg nightly as of last night. He reports a good appetite, and reports that he is tolerating his medications well. He complains of left arm stiffness, but states that this was preexistent prior to admission. Risperdal and Cogentin were discontinued on admission due to stiffness. Pt's Geodon for management of his psychosis was increased with the morning dose today to 60 mg BID with meals. Repeat EKG due to dose increase with QTC of 438 and Possible Acute pericarditis. Attending Physician consulted Cardiology, and spoke with Dr. Cristal Deerhristopher who states that the EKG  is similar to the previous and it related to early repolarization. Pt complaining of being short of breath, but denies chest pain, denies chest tightness. He has mostly been in his room laying in bed, and has been encouraged to get out of bed and walk in the halls or sit in the day room. Night time sleeplessness is most likely related to the fact that pt is spending all day in bed. Will add Melatonin to current medication regimen, and move night time meds to 20.00. Flow sheets are reflecting that pt slept for a total of 7.5 hrs last night. Cogentin 0.5 mg BID added to pt's medications regimen d/t complaints of L arm stiffness. 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  Labs Reviewed, prolactin is currently elevated at 17.5, but pt was switched from Risperdal to Geodon on admission. EKG with QTC of 438, and elevated T waves with early repolarization. Repeated (please see notes above) Reviewed CMP, CBC, lipid panel, Hemoglobin A1C & TSH. 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 Will need to educated on this as mental status becomes more stable & will need PCP f/u on discharge.   Principal Problem: Schizophrenia (HCC) Diagnosis: Principal Problem:   Schizophrenia (HCC) Active Problems:   Anxiety state   Insomnia  Total Time spent with patient: 30 minutes  Past Psychiatric History: As above  Past Medical History:  Past Medical History:  Diagnosis Date   Schizophrenia (HCC)    Schizophrenia, paranoid  type (HCC) 02/02/2021   History reviewed. No pertinent surgical history. Family History: History reviewed. No pertinent family history. Family Psychiatric  History: none reported Social History:  Social History   Substance and Sexual Activity  Alcohol Use Not Currently   Alcohol/week: 1.0 standard drink   Types: 1 Cans of beer per week   Comment: occasional      Social History   Substance and Sexual Activity  Drug Use Never    Social History   Socioeconomic History   Marital status: Single    Spouse name: Not on file   Number of children: 1   Years of education: Not on file   Highest education level: Not on file  Occupational History   Occupation: Unemployed  Tobacco Use   Smoking status: Never   Smokeless tobacco: Never  Vaping Use   Vaping Use: Never used  Substance and Sexual Activity   Alcohol use: Not Currently    Alcohol/week: 1.0 standard drink    Types: 1 Cans of beer per week    Comment: occasional   Drug use: Never   Sexual activity: Never  Other Topics Concern   Not on file  Social History Narrative   ** Merged History Encounter **       Pt lives with mother and other relatives.  Currently unemployed.  Receives outpatient psychiatry services through [facility].   Social Determinants of Health   Financial Resource Strain: Not on file  Food Insecurity: Not on file  Transportation Needs: Not on file  Physical Activity: Not on file  Stress: Not on file  Social Connections: Not on file   Additional Social History:   Sleep: Good  Appetite:  Good  Current Medications: Current Facility-Administered Medications  Medication Dose Route Frequency Provider Last Rate Last Admin   acetaminophen (TYLENOL) tablet 650 mg  650 mg Oral Q6H PRN Oneta Rack, NP   650 mg at 06/05/21 0810   alum & mag hydroxide-simeth (MAALOX/MYLANTA) 200-200-20 MG/5ML suspension 30 mL  30 mL Oral Q4H PRN Oneta Rack, NP       benztropine (COGENTIN) tablet 0.5 mg  0.5 mg Oral BID PRN Comer Locket, MD       hydrOXYzine (ATARAX) tablet 25 mg  25 mg Oral TID PRN Oneta Rack, NP   25 mg at 06/04/21 2100   magnesium hydroxide (MILK OF MAGNESIA) suspension 30 mL  30 mL Oral Daily PRN Oneta Rack, NP       melatonin tablet 3 mg  3 mg Oral QHS Caulin Begley, NP       OLANZapine (ZYPREXA) injection 5 mg  5 mg Intramuscular TID PRN  Massengill, Harrold Donath, MD       OLANZapine (ZYPREXA) tablet 5 mg  5 mg Oral TID PRN Phineas Inches, MD       traZODone (DESYREL) tablet 100 mg  100 mg Oral QHS Kellyann Ordway, NP       ziprasidone (GEODON) capsule 60 mg  60 mg Oral BID WC Doyle Kunath, NP   60 mg at 06/05/21 0809    Lab Results:  No results found for this or any previous visit (from the past 48 hour(s)).   Blood Alcohol level:  Lab Results  Component Value Date   [facility] <10 05/30/2021   ETH <10 02/02/2021    Metabolic Disorder Labs: Lab Results  Component Value Date   HGBA1C 5.9 (H) 06/02/2021   MPG 122.63 06/02/2021   MPG 126 02/03/2021  Lab Results  Component Value Date   PROLACTIN 17.5 (H) 06/02/2021   PROLACTIN 23.4 (H) 11/28/2018   Lab Results  Component Value Date   CHOL 137 06/02/2021   TRIG 105 06/02/2021   HDL 36 (L) 06/02/2021   CHOLHDL 3.8 06/02/2021   VLDL 21 06/02/2021   LDLCALC 80 06/02/2021   LDLCALC 80 01/30/2021    Physical Findings: AIMS: Facial and Oral Movements Muscles of Facial Expression: None, normal Lips and Perioral Area: None, normal Jaw: None, normal Tongue: None, normal,Extremity Movements Upper (arms, wrists, hands, fingers): None, normal Lower (legs, knees, ankles, toes): None, normal, Trunk Movements Neck, shoulders, hips: None, normal, Overall Severity Severity of abnormal movements (highest score from questions above): None, normal Incapacitation due to abnormal movements: None, normal Patient's awareness of abnormal movements (rate only patient's report): No Awareness, Dental Status Current problems with teeth and/or dentures?: Yes Does patient usually wear dentures?: No  CIWA:    COWS:     Musculoskeletal: Strength & Muscle Tone: within normal limits Gait & Station: normal Patient leans: N/A  Psychiatric Specialty Exam:  Presentation  General Appearance: Appropriate for Environment; Fairly Groomed  Eye Contact:Fair  Speech:Clear and  Coherent  Speech Volume:Normal  Handedness:Right   Mood and Affect  Mood:Depressed  Affect:Congruent   Thought Process  Thought Processes:Coherent  Descriptions of Associations:Intact  Orientation:Full (Time, Place and Person)  Thought Content:Illogical  History of Schizophrenia/Schizoaffective disorder:Yes  Duration of Psychotic Symptoms:Greater than six months  Hallucinations:Hallucinations: Auditory Description of Auditory Hallucinations: voices saying "his" & "hers" and also moaning in nature  Ideas of Reference:Paranoia; Delusions  Suicidal Thoughts:Suicidal Thoughts: No  Homicidal Thoughts:Homicidal Thoughts: No  Sensorium  Memory:Immediate Good  Judgment:Fair  Insight:Fair  Executive Functions  Concentration:Fair  Attention Span:Fair  Recall:Fair  Fund of Knowledge:Fair  Language:Fair  Psychomotor Activity  Psychomotor Activity:Psychomotor Activity: Normal  Assets  Assets:Communication Skills  Sleep  Sleep:Sleep: Fair  Physical Exam: Physical Exam Constitutional:      Appearance: Normal appearance.  HENT:     Head: Normocephalic.     Nose: Nose normal. No congestion or rhinorrhea.  Eyes:     Pupils: Pupils are equal, round, and reactive to light.  Pulmonary:     Effort: Pulmonary effort is normal.  Musculoskeletal:        General: Normal range of motion.     Cervical back: Normal range of motion.  Neurological:     General: No focal deficit present.     Mental Status: He is alert and oriented to person, place, and time.   Review of Systems  Constitutional: Negative.  Negative for fever.  HENT: Negative.  Negative for sore throat.   Eyes: Negative.   Respiratory: Negative.  Negative for cough.   Cardiovascular: Negative.   Gastrointestinal: Negative.   Genitourinary: Negative.   Musculoskeletal: Negative.   Skin: Negative.   Neurological: Negative.   Psychiatric/Behavioral:  Positive for depression and hallucinations.  Negative for memory loss, substance abuse and suicidal ideas. The patient is not nervous/anxious and does not have insomnia.   Blood pressure (!) 124/95, pulse 99, temperature 98.7 F (37.1 C), temperature source Oral, resp. rate 16, height 6\' 1"  (1.854 m), weight 70.8 kg, SpO2 98 %. Body mass index is 20.58 kg/m.  Treatment Plan Summary: Daily contact with patient to assess and evaluate symptoms and progress in treatment and Medication management   Observation Level/Precautions:  15 minute checks  Laboratory:  Labs reviewed   Psychotherapy:  Unit Group sessions  Medications:  See [facility]  Consultations:  To be determined   Discharge Concerns:  Safety, medication compliance, mood stability  Estimated LOS: 5-7 days  Other:  N/A    Physician Treatment Plan for Primary Diagnosis: Schizophrenia (HCC)   PLAN Safety and Monitoring: Voluntary admission to inpatient psychiatric unit for safety, stabilization and treatment Daily contact with patient to assess and evaluate symptoms and progress in treatment Patient's case to be discussed in multi-disciplinary team meeting Observation Level : q15 minute checks Vital signs: q12 hours Precautions: Safety, No Roommate due to paranoia   Long Term Goal(s): Improvement in symptoms so as ready for discharge   Short Term Goals: Ability to verbalize feelings will improve, Ability to disclose and discuss suicidal ideas, and Compliance with prescribed medications will improve   Diagnoses Principal Problem:   Schizophrenia (HCC) Active Problems:   Anxiety state   Insomnia                       Medications -Continue Geodon to 60 mg BID with meals for psychosis  - repeated EKG with QTC 438 & "possible pericarditis which cardiology states is r/t early repolarization  -Continue Agitation protocol as per MAR (Zyprexa PO/IM PRN) -Continue Trazodone to 100 mg nightly for insomnia -Continue Hydroxyzine 25 mg PRN TID for anxiety -Start Melatonin 3 mg nightly  for insomnia -Continue Cogentin 0.5 mg BID for EPS (c/o  Left arm stiffness)   Other PRNS -Continue Tylenol 650 mg every 6 hours PRN for mild pain -Continue Maalox 30 mg every 4 hrs PRN for indigestion -Continue Milk of Magnesia as needed every 6 hrs for constipation   Discharge Planning: Social work and case management to assist with discharge planning and identification of hospital follow-up needs prior to discharge Estimated LOS: 5-7 days Discharge Concerns: Need to establish a safety plan; Medication compliance and effectiveness Discharge Goals: Return home with outpatient referrals for mental health follow-up including medication management/psychotherapy  Starleen Blue, NP 06/05/2021, 2:16 PM Patient ID: Wesley Peterson, male   DOB: [birth year], 36 y.o.   MRN: [id number]

## 2021-06-05 NOTE — Group Note (Signed)
[facility] Group Notes: (Clinical Social Work)   06/05/2021      Type of Therapy:  Group Therapy   Participation Level:  Did Not Attend - was invited both individually by [facility] and by overhead announcement, chose not to attend.   Ambrose Mantle, LCSW 06/05/2021, 12:50 PM

## 2021-06-05 NOTE — Progress Notes (Signed)
Adult Psychoeducational Group Note  Date:  06/05/2021 Time:  11:22 PM  Group Topic/Focus:  Wrap-Up Group:   The focus of this group is to help patients review their daily goal of treatment and discuss progress on daily workbooks.  Participation Level:  Did Not Attend  Participation Quality:   Did Not Attend  Affect:   Did Not Attend  Cognitive:   Did Not Attend  Insight: None  Engagement in Group:   Did Not Attend  Modes of Intervention:   Did Not Attend  Additional Comments:  Pt was encouraged to attend wrap up group but did not attend.  [name] 06/05/2021, 11:22 PM

## 2021-06-05 NOTE — Progress Notes (Signed)
   06/04/21 2300  Psych Admission Type (Psych Patients Only)  Admission Status Voluntary  Psychosocial Assessment  Patient Complaints None  Eye Contact Brief  Facial Expression Flat  Affect Depressed  Speech Soft  Interaction Isolative  Motor Activity Slow  Appearance/Hygiene Unremarkable  Behavior Characteristics Appropriate to situation  Mood Pleasant  Thought Process  Coherency WDL  Delusions None reported or observed  Perception WDL  Hallucination None reported or observed  Judgment Poor  Confusion None  Danger to Self  Current suicidal ideation? Denies  Danger to Others  Danger to Others None reported or observed

## 2021-06-05 NOTE — [facility] Group Notes (Signed)
.  Psychoeducational Group Note    Date:  5/28//23 Time: 1300-1400    Purpose of Group: . The group focus' on teaching patients on how to identify their needs and their Life Skills:  A group where two lists are made. What people need and what are things that we do that are unhealthy. The lists are developed by the patients and it is explained that we often do the actions that are not healthy to get our list of needs met.  Goal:: to develop the coping skills needed to get their needs met  Participation Level:  did not attend  Wesley Peterson

## 2021-06-05 NOTE — [facility] Group Notes (Signed)
Adult Psychoeducational Group Not Date:  06/05/2021 Time:  0900-1045 Group Topic/Focus: PROGRESSIVE RELAXATION. A group where deep breathing is taught and tensing and relaxation muscle groups is used. Imagery is used as well.  Pts are asked to imagine 3 pillars that hold them up when they are not able to hold themselves up and to share that with the group.  Participation Level:  did not attend Farzana Koci A   

## 2021-06-05 NOTE — Progress Notes (Signed)
   06/05/21 2200  Psych Admission Type (Psych Patients Only)  Admission Status Voluntary  Psychosocial Assessment  Patient Complaints None  Eye Contact Brief  Facial Expression Flat  Affect Depressed;Flat  Speech Soft  Interaction Isolative;Minimal  Motor Activity Slow  Appearance/Hygiene In scrubs  Behavior Characteristics Appropriate to situation  Mood Pleasant  Thought Process  Coherency WDL  Content UTA  Delusions None reported or observed  Perception Hallucinations  Hallucination Auditory  Judgment Poor  Confusion None  Danger to Self  Current suicidal ideation? Denies  Danger to Others  Danger to Others None reported or observed

## 2021-06-05 NOTE — Progress Notes (Signed)
   06/05/21 1000  Psych Admission Type (Psych Patients Only)  Admission Status Voluntary  Psychosocial Assessment  Patient Complaints None  Eye Contact Brief  Facial Expression Flat  Affect Depressed  Speech Soft  Interaction Isolative  Motor Activity Slow  Appearance/Hygiene Unremarkable  Behavior Characteristics Appropriate to situation  Mood Pleasant  Thought Process  Coherency WDL  Content UTA  Delusions None reported or observed  Perception Hallucinations  Hallucination Auditory  Judgment Poor  Confusion None  Danger to Self  Current suicidal ideation? Denies  Danger to Others  Danger to Others None reported or observed

## 2021-06-06 MED ORDER — ZIPRASIDONE HCL 80 MG PO CAPS
80.0000 mg | ORAL_CAPSULE | Freq: Two times a day (BID) | ORAL | Status: DC
  Administered 2021-06-06 – 2021-06-10 (×8): 80 mg via ORAL
  Filled 2021-06-06 (×11): qty 1

## 2021-06-06 MED ORDER — AMLODIPINE BESYLATE 5 MG PO TABS
5.0000 mg | ORAL_TABLET | Freq: Every day | ORAL | Status: DC
  Administered 2021-06-06 – 2021-06-07 (×2): 5 mg via ORAL
  Filled 2021-06-06 (×5): qty 1

## 2021-06-06 MED ORDER — POLYETHYLENE GLYCOL 3350 17 G PO PACK: 17.0000 g | PACK | Freq: Every day | ORAL | Status: DC | PRN

## 2021-06-06 MED ORDER — BENZTROPINE MESYLATE 0.5 MG PO TABS
0.5000 mg | ORAL_TABLET | Freq: Two times a day (BID) | ORAL | Status: DC
Start: 2021-06-06 — End: 2021-06-07
  Administered 2021-06-06 – 2021-06-07 (×2): 0.5 mg via ORAL
  Filled 2021-06-06 (×7): qty 1

## 2021-06-06 NOTE — Group Note (Signed)
[facility] LCSW Group Therapy Note   Group Date: 06/06/2021 Start Time: 1300 End Time: 1330   Type of Therapy/Topic:  Group Therapy:  Emotion Regulation  Participation Level:  Did Not Attend   Mood:  Description of Group:    The purpose of this group is to assist patients in learning to regulate negative emotions and experience positive emotions. Patients will be guided to discuss ways in which they have been vulnerable to their negative emotions. These vulnerabilities will be juxtaposed with experiences of positive emotions or situations, and patients challenged to use positive emotions to combat negative ones. Special emphasis will be placed on coping with negative emotions in conflict situations, and patients will process healthy conflict resolution skills.  Therapeutic Goals: Patient will identify two positive emotions or experiences to reflect on in order to balance out negative emotions:  Patient will label two or more emotions that they find the most difficult to experience:  Patient will be able to demonstrate positive conflict resolution skills through discussion or role plays:   Summary of Patient Progress:   Patient did not attend group despite encouraged participation.     Therapeutic Modalities:   Cognitive Behavioral Therapy Feelings Identification Dialectical Behavioral Therapy   Norva Bowe W Fortunata Betty, LCSWA 

## 2021-06-06 NOTE — [facility] Group Notes (Signed)
  Spiritual care group on grief and loss facilitated by chaplain Dyanne Carrel, [facility]   Group Goal:   Support / Education around grief and loss   Members engage in facilitated group support and psycho-social education.   Group Description:   Following introductions and group rules, group members engaged in facilitated group dialog and support around topic of loss, with particular support around experiences of loss in their lives. Group Identified types of loss (relationships / self / things) and identified patterns, circumstances, and changes that precipitate losses. Reflected on thoughts / feelings around loss, normalized grief responses, and recognized variety in grief experience. Group noted Worden's four tasks of grief in discussion.   Group drew on Adlerian / Rogerian, narrative, MI,   Patient Progress: Wesley Peterson attended group and showed engagement, though did not verbally participate.  [address], Bcc Pager, [phone]

## 2021-06-06 NOTE — Plan of Care (Addendum)
Patient went to lunch in dining room.  Patient stated he was feeling a little better this afternoon. Stated he needs therapy after [facility] discharge.

## 2021-06-06 NOTE — Progress Notes (Addendum)
Nurse discussed anxiety, depression and coping skills with patient.  

## 2021-06-06 NOTE — Progress Notes (Signed)
[facility] MD Progress Note  06/06/2021 2:26 PM Wesley [facility]  MRN:  [id number]  Subjective:  [name] reports: "I attended the grief group, and I feel like I ."  Reason For Admission: Wesley Peterson is a 36 y.o [place] American male with a history of Schizophrenia who presented to the [facility] with complaints of worsening auditory hallucinations, passive SI & depressive symptoms in the context of medication non compliance and homelessness. Pt was transferred voluntarily to this [facility] for treatment and stabilization of his mood.  Today's patient assessment Note: Pt's chart reviewed, his case discussed with his treatment team. Pt continues to present with a flat affect and depressed mood. His attention to personal hygiene and grooming is fair, eye contact is fair, speech is clear & coherent. Thought contents are organized, but still with some illogical contents. Pt continues to present with paranoia & delusional thinking. He continues to think that nonspecific people know what he is thinking and are out to get him. He continues to state that people put thoughts in his brain and take thoughts out of his brain. He continues to report +AH of voices saying "his", "hers", and also moaning. He denies VH, and denies SI/HI.  Pt reports a fair sleep quality last night, but states that he does not feel tired today. He reports sleeping a lot in the first couple of days that he was admitted to this [facility] because he was "trying to catch up on my sleep because I had been out there for too long." He reports that he is currently able to come out of his room and attending group sessions. He reports that he attended the morning group session which was on grief, and feels "like I am losing myself, and losing my thinking process." He is unable to elaborate further on what he means by this. He reports that he is tolerating current medications well, but reports left arm stiffness. He reports that he had a bowel movement today,  but states that he had gone for a couple of days without one, but that his appetite is good. Will add Miralax daily PRN to his medication regimen. Geodon increased to 80 mg BID with meals for management of psychosis.  Labs Reviewed, prolactin is currently elevated at 17.5, but pt was switched from Risperdal to Geodon on admission. EKG with QTC of 438, and elevated T waves with early repolarization. Repeated (please see notes above) Reviewed CMP, CBC, lipid panel, Hemoglobin A1C & TSH. 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 Will need to educated on this as mental status becomes more stable & will need PCP f/u on discharge.   Principal Problem: Schizophrenia (HCC) Diagnosis: Principal Problem:   Schizophrenia (HCC) Active Problems:   Anxiety state   Insomnia  Total Time spent with patient: 30 minutes  Past Psychiatric History: As above  Past Medical History:  Past Medical History:  Diagnosis Date   Schizophrenia (HCC)    Schizophrenia, paranoid type (HCC) 02/02/2021   History reviewed. No pertinent surgical history. Family History: History reviewed. No pertinent family history. Family Psychiatric  History: none reported Social History:  Social History   Substance and Sexual Activity  Alcohol Use Not Currently   Alcohol/week: 1.0 standard drink   Types: 1 Cans of beer per week   Comment: occasional     Social History   Substance and Sexual Activity  Drug Use Never    Social History   Socioeconomic History   Marital status: Single  Spouse name: Not on file   Number of children: 1   Years of education: Not on file   Highest education level: Not on file  Occupational History   Occupation: Unemployed  Tobacco Use   Smoking status: Never   Smokeless tobacco: Never  Vaping Use   Vaping Use: Never used  Substance and Sexual Activity   Alcohol use: Not Currently    Alcohol/week: 1.0 standard drink    Types: 1 Cans of beer per week    Comment: occasional    Drug use: Never   Sexual activity: Never  Other Topics Concern   Not on file  Social History Narrative   ** Merged History Encounter **       Pt lives with mother and other relatives.  Currently unemployed.  Rece[facility].   Social Determinants of Health   Financial Resource Strain: Not on file  Food Insecurity: Not on file  Transportation Needs: Not on file  Physical Activity: Not on file  Stress: Not on file  Social Connections: Not on file   Additional Social History:   Sleep: Good  Appetite:  Good  Current Medications: Current Facility-Administered Medications  Medication Dose Route Frequency Provider Last Rate Last Admin   acetaminophen (TYLENOL) tablet 650 mg  650 mg Oral Q6H PRN Oneta RackLewis, Tanika N, NP   650 mg at 06/06/21 0915   alum & mag hydroxide-simeth (MAALOX/MYLANTA) 200-200-20 MG/5ML suspension 30 mL  30 mL Oral Q4H PRN Oneta RackLewis, Tanika N, NP       amLODipine (NORVASC) tablet 5 mg  5 mg Oral Daily Massengill, Nathan, MD   5 mg at 06/06/21 0911   benztropine (COGENTIN) tablet 0.5 mg  0.5 mg Oral BID Massengill, Harrold DonathNathan, MD   0.5 mg at 06/06/21 1344   hydrOXYzine (ATARAX) tablet 25 mg  25 mg Oral TID PRN Oneta RackLewis, Tanika N, NP   25 mg at 06/06/21 0915   magnesium hydroxide (MILK OF MAGNESIA) suspension 30 mL  30 mL Oral Daily PRN Oneta RackLewis, Tanika N, NP       melatonin tablet 3 mg  3 mg Oral QHS Atalia Litzinger, NP   3 mg at 06/05/21 2144   OLANZapine (ZYPREXA) injection 5 mg  5 mg Intramuscular TID PRN Massengill, Harrold DonathNathan, MD       OLANZapine (ZYPREXA) tablet 5 mg  5 mg Oral TID PRN Phineas InchesMassengill, Nathan, MD       traZODone (DESYREL) tablet 100 mg  100 mg Oral QHS Vern Prestia, NP   100 mg at 06/05/21 2143   ziprasidone (GEODON) capsule 80 mg  80 mg Oral BID WC Massengill, Nathan, MD        Lab Results:  No results found for this or any previous visit (from the past 48 hour(s)).   Blood Alcohol level:  Lab Results  Component Value  Date   ETH <10 05/30/2021   ETH <10 02/02/2021    Metabolic Disorder Labs: Lab Results  Component Value Date   HGBA1C 5.9 (H) 06/02/2021   MPG 122.63 06/02/2021   MPG 126 02/03/2021   Lab Results  Component Value Date   PROLACTIN 17.5 (H) 06/02/2021   PROLACTIN 23.4 (H) 11/28/2018   Lab Results  Component Value Date   CHOL 137 06/02/2021   TRIG 105 06/02/2021   HDL 36 (L) 06/02/2021   CHOLHDL 3.8 06/02/2021   VLDL 21 06/02/2021   LDLCALC 80 06/02/2021   LDLCALC 80 01/30/2021    Physical Findings:  AIMS: Facial and Oral Movements Muscles of Facial Expression: None, normal Lips and Perioral Area: None, normal Jaw: None, normal Tongue: None, normal,Extremity Movements Upper (arms, wrists, hands, fingers): None, normal Lower (legs, knees, ankles, toes): None, normal, Trunk Movements Neck, shoulders, hips: None, normal, Overall Severity Severity of abnormal movements (highest score from questions above): None, normal Incapacitation due to abnormal movements: None, normal Patient's awareness of abnormal movements (rate only patient's report): No Awareness, Dental Status Current problems with teeth and/or dentures?: Yes Does patient usually wear dentures?: No  CIWA:    COWS:     Musculoskeletal: Strength & Muscle Tone: within normal limits Gait & Station: normal Patient leans: N/A  Psychiatric Specialty Exam:  Presentation  General Appearance: Appropriate for Environment; Fairly Groomed  Eye Contact:Fair  Speech:Clear and Coherent  Speech Volume:Normal  Handedness:Right   Mood and Affect  Mood:Depressed  Affect:Congruent   Thought Process  Thought Processes:Coherent  Descriptions of Associations:Intact  Orientation:Full (Time, Place and Person)  Thought Content:Illogical  History of Schizophrenia/Schizoaffective disorder:Yes  Duration of Psychotic Symptoms:Greater than six months  Hallucinations:Hallucinations: Auditory Description of  Auditory Hallucinations: voices saying "his" & "hers" and also moaning in nature  Ideas of Reference:Paranoia; Delusions  Suicidal Thoughts:Suicidal Thoughts: No  Homicidal Thoughts:Homicidal Thoughts: No  Sensorium  Memory:Immediate Good  Judgment:Fair  Insight:Fair  Executive Functions  Concentration:Fair  Attention Span:Fair  Recall:Fair  Fund of Knowledge:Fair  Language:Fair  Psychomotor Activity  Psychomotor Activity:Psychomotor Activity: Normal  Assets  Assets:Communication Skills  Sleep  Sleep:Sleep: Fair  Physical Exam: Physical Exam Constitutional:      Appearance: Normal appearance.  HENT:     Head: Normocephalic.     Nose: Nose normal. No congestion or rhinorrhea.  Eyes:     Pupils: Pupils are equal, round, and reactive to light.  Pulmonary:     Effort: Pulmonary effort is normal.  Musculoskeletal:        General: Normal range of motion.     Cervical back: Normal range of motion.  Neurological:     General: No focal deficit present.     Mental Status: He is alert and oriented to person, place, and time.   Review of Systems  Constitutional: Negative.  Negative for fever.  HENT: Negative.  Negative for sore throat.   Eyes: Negative.   Respiratory: Negative.  Negative for cough.   Cardiovascular: Negative.   Gastrointestinal: Negative.   Genitourinary: Negative.   Musculoskeletal: Negative.   Skin: Negative.   Neurological: Negative.   Psychiatric/Behavioral:  Positive for depression and hallucinations. Negative for memory loss, substance abuse and suicidal ideas. The patient is not nervous/anxious and does not have insomnia.   Blood pressure (!) 132/98, pulse 66, temperature 98.7 F (37.1 C), temperature source Oral, resp. rate 18, height  (1.854 m), weight 70.8 kg, SpO2 98 %. Body mass index is 20.58 kg/m.  Treatment Plan Summary: Daily contact with patient to assess and evaluate symptoms and progress in treatment and Medication  management   Observation Level/Precautions:  15 minute checks  Laboratory:  Labs reviewed   Psychotherapy:  Unit Group sessions  Medications:  See [facility]  Consultations:  To be determined   Discharge Concerns:  Safety, medication compliance, mood stability  Estimated LOS: 5-7 days  Other:  N/A    Physician Treatment Plan for Primary Diagnosis: Schizophrenia (HCC)   PLAN Safety and Monitoring: Voluntary admission to inpatient psychiatric unit for safety, stabilization and treatment Daily contact with patient to assess and evaluate symptoms and progress  in treatment Patient's case to be discussed in multi-disciplinary team meeting Observation Level : q15 minute checks Vital signs: q12 hours Precautions: Safety, No Roommate due to paranoia   Long Term Goal(s): Improvement in symptoms so as ready for discharge   Short Term Goals: Ability to verbalize feelings will improve, Ability to disclose and discuss suicidal ideas, and Compliance with prescribed medications will improve   Diagnoses Principal Problem:   Schizophrenia (HCC) Active Problems:   Anxiety state   Insomnia                       Medications -Increase Geodon to 80 mg BID with meals for psychosis  - repeated EKG with QTC 438 & "possible pericarditis which cardiology states is r/t early repolarization  -Continue Agitation protocol as per MAR (Zyprexa PO/IM PRN) -Continue Trazodone to 100 mg nightly for insomnia -Continue Hydroxyzine 25 mg PRN TID for anxiety -Continue Melatonin 3 mg nightly for insomnia -Continue Cogentin 0.5 mg BID for EPS (c/o  Left arm stiffness) -   Other PRNS -Continue Tylenol 650 mg every 6 hours PRN for mild pain -Continue Maalox 30 mg every 4 hrs PRN for indigestion -Continue Milk of Magnesia as needed every 6 hrs for constipation   Discharge Planning: Social work and case management to assist with discharge planning and identification of [facility] follow-up needs prior to  discharge Estimated LOS: 5-7 days Discharge Concerns: Need to establish a safety plan; Medication compliance and effectiveness Discharge Goals: Return home with outpatient referrals for mental health follow-up including medication management/psychotherapy  Starleen Blue, NP 06/06/2021, 2:26 PM Patient ID: [facility], male   DOB: 06-Oct-1985, 36 y.o.   MRN: [id number]

## 2021-06-06 NOTE — Progress Notes (Signed)
The patient attended the evening A.A.meeting and was appropriate.  

## 2021-06-06 NOTE — Progress Notes (Addendum)
D:  Patient's self inventory sheet, patient has fair sleep, does have good appetite, normal energy level, good concentration.  Rated depression, anxiety, hopeless #8.  Denied withdrawals. Denied SI.  Physical pain worst pain #9.  Pain meds helpful.  Goal is work on depression and gain strengths.  No soda or junk food late at night.  Hearing voices, all I do is take meds, need someone to talk to, maybe therapy.  No discharge plans.   A:  Medications administered per MD orders.  Emotional support and encouragement given patient. R:  Denied SI and HI, contracts for safety.    Denied visual hallucinations.  Patient stated he does hear voices, saying his, hers, moaning for the past 3 months.  Stated he only slept about 3 hours last night, but slept most of yesterday.

## 2021-06-06 NOTE — [facility] Group Notes (Signed)
[facility] Group Notes- Patients were educated on the difference between positive and negative thinking and how positive reframing can impact mood. The patients were then given a poem to read by the Dalai Lama on the power of thoughts. The patients were then asked to share one negative thought or belief they would like to let go of. Patient shared and participated.  

## 2021-06-07 MED ORDER — BENZTROPINE MESYLATE 1 MG PO TABS
1.0000 mg | ORAL_TABLET | Freq: Two times a day (BID) | ORAL | Status: DC
  Administered 2021-06-07 – 2021-06-10 (×6): 1 mg via ORAL
  Filled 2021-06-07 (×8): qty 1

## 2021-06-07 MED ORDER — AMLODIPINE BESYLATE 10 MG PO TABS
10.0000 mg | ORAL_TABLET | Freq: Every day | ORAL | Status: DC
Start: 2021-06-08 — End: 2021-06-10
  Administered 2021-06-08 – 2021-06-10 (×3): 10 mg via ORAL
  Filled 2021-06-07 (×4): qty 1

## 2021-06-07 MED ORDER — OLANZAPINE 5 MG PO TABS
5.0000 mg | ORAL_TABLET | Freq: Every day | ORAL | Status: DC
  Administered 2021-06-07 – 2021-06-08 (×2): 5 mg via ORAL
  Filled 2021-06-07 (×4): qty 1

## 2021-06-07 NOTE — [facility] Group Notes (Signed)
Adult Psychoeducational Group Note  Date:  06/07/2021 Time:  10:19 PM  Group Topic/Focus:  Wrap-Up Group:   The focus of this group is to help patients review their daily goal of treatment and discuss progress on daily workbooks.  Participation Level:  Active  Participation Quality:  Attentive  Affect:  Appropriate  Cognitive:  Alert  Insight: Appropriate  Engagement in Group:  Engaged  Modes of Intervention:  Discussion  Additional Comments:  Patient attended and participated in the Wrap-up group.  Jearl Klinefelter 06/07/2021, 10:19 PM

## 2021-06-07 NOTE — Progress Notes (Signed)
   06/06/21 2200  Psych Admission Type (Psych Patients Only)  Admission Status Voluntary  Psychosocial Assessment  Patient Complaints None  Eye Contact Fair  Facial Expression Animated  Affect Appropriate to circumstance  Speech Soft  Interaction Assertive  Motor Activity Slow  Appearance/Hygiene Improved  Behavior Characteristics Appropriate to situation;Cooperative  Mood Pleasant  Thought Process  Coherency WDL  Content WDL  Delusions None reported or observed  Perception WDL  Hallucination None reported or observed  Judgment Poor  Confusion None  Danger to Self  Current suicidal ideation? Denies  Danger to Others  Danger to Others None reported or observed

## 2021-06-07 NOTE — Progress Notes (Signed)
Pt denies SI/HI/VH but endorses AH and verbally agrees to approach staff if these become apparent or before harming themselves/others. Rates depression 8/10. Rates anxiety 9/10. Rates pain 0/10. Pt has been asleep and in his room for most of the day up until the evening. Scheduled medications administered to pt, per MD orders. RN provided support and encouragement to pt. Q15 min safety checks implemented and continued. Pt safe on the unit. RN will continue to monitor and intervene as needed.   06/07/21 0738  Psych Admission Type (Psych Patients Only)  Admission Status Voluntary  Psychosocial Assessment  Patient Complaints Anxiety;Depression  Eye Contact Fair  Facial Expression Sad;Flat  Affect Appropriate to circumstance  Speech Logical/coherent;Soft  Interaction Assertive  Motor Activity Slow  Appearance/Hygiene Unremarkable  Behavior Characteristics Cooperative;Appropriate to situation;Calm  Mood Pleasant;Sad  Thought Process  Coherency WDL  Content WDL  Delusions None reported or observed  Perception Hallucinations  Hallucination Auditory  Judgment Poor  Confusion None  Danger to Self  Current suicidal ideation? Denies  Danger to Others  Danger to Others None reported or observed

## 2021-06-07 NOTE — Progress Notes (Signed)
[facility] MD Progress Note  06/07/2021 3:04 PM Raylynn Della  MRN:  [id number]  Subjective:  Carthel reports: "I am still hearing the voices. They have not changed and are still saying his and hers, and moaning ."  Reason For Admission: Massai Mandujano is a 36 y.o [place] American male with a history of Schizophrenia who presented to the [facility] with complaints of worsening auditory hallucinations, passive SI & depressive symptoms in the context of medication non compliance and homelessness. Pt was transferred voluntarily to this [facility] for treatment and stabilization of his mood.  Today's patient assessment Note: Pt's chart reviewed, his case discussed with his treatment team. Pt continues to present with a flat affect and depressed mood. His attention to personal hygiene and grooming is fair, eye contact is fair, speech is clear & coherent. Patient continues to present with paranoia, thinks that nonspecific people are out to get him, reports that he is continuing to have +AH of voices moaning and saying "his", and "hers". He reports a poor sleep quality last night, and sleep is recorded as 6.5 hours. He reports a fair appetite, and continues to complain of left arm stiffness. Pt currently on Cogentin 0.5 mg BID for left arm stiffness. Increasing Cogentin to 1 mg BID for stiffness, and adding Zyprexa 5 mg nightly for psychosis. He denies SI/VH. Will continue other medications as listed below.  Labs Reviewed, prolactin is currently elevated at 17.5, but pt was switched from Risperdal to Geodon on admission. EKG with QTC of 438, and elevated T waves with early repolarization. Repeated showed possible pericarditis and Cardiology reviewed and stated that it is related to early repolarization. Reviewed CMP, CBC, lipid panel, Hemoglobin A1C & TSH. 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 Will need to educated on this as mental status becomes more stable & will need PCP f/u on discharge.    Principal Problem: Schizophrenia ([place]) Diagnosis: Principal Problem:   Schizophrenia ([place]) Active Problems:   Anxiety state   Insomnia  Total Time spent with patient: 30 minutes  Past Psychiatric History: As above  Past Medical History:  Past Medical History:  Diagnosis Date   Schizophrenia ([place])    Schizophrenia, paranoid type ([place]) 02/02/2021   History reviewed. No pertinent surgical history. Family History: History reviewed. No pertinent family history. Family Psychiatric  History: none reported Social History:  Social History   Substance and Sexual Activity  Alcohol Use Not Currently   Alcohol/week: 1.0 standard drink   Types: 1 Cans of beer per week   Comment: occasional     Social History   Substance and Sexual Activity  Drug Use Never    Social History   Socioeconomic History   Marital status: Single    Spouse name: Not on file   Number of children: 1   Years of education: Not on file   Highest education level: Not on file  Occupational History   Occupation: Unemployed  Tobacco Use   Smoking status: Never   Smokeless tobacco: Never  Vaping Use   Vaping Use: Never used  Substance and Sexual Activity   Alcohol use: Not Currently    Alcohol/week: 1.0 standard drink    Types: 1 Cans of beer per week    Comment: occasional   Drug use: Never   Sexual activity: Never  Other Topics Concern   Not on file  Social History Narrative   ** Merged History Encounter **       Pt lives with  mother and other relatives.  Currently unemployed.  [place] outpatient psychiatry services through [facility].   Social Determinants of Health   Financial Resource Strain: Not on file  Food Insecurity: Not on file  Transportation Needs: Not on file  Physical Activity: Not on file  Stress: Not on file  Social Connections: Not on file   Additional Social History:   Sleep: Good  Appetite:  Good  Current Medications: Current Facility-Administered Medications   Medication Dose Route Frequency Provider Last Rate Last Admin   acetaminophen (TYLENOL) tablet 650 mg  650 mg Oral Q6H PRN Derrill Center, NP   650 mg at 06/06/21 0915   alum & mag hydroxide-simeth (MAALOX/MYLANTA) 200-200-20 MG/5ML suspension 30 mL  30 mL Oral Q4H PRN Derrill Center, NP       [START ON 06/08/2021] amLODipine (NORVASC) tablet 10 mg  10 mg Oral Daily Massengill, Nathan, MD       benztropine (COGENTIN) tablet 1 mg  1 mg Oral BID Massengill, Ovid Curd, MD       hydrOXYzine (ATARAX) tablet 25 mg  25 mg Oral TID PRN Derrill Center, NP   25 mg at 06/07/21 [id number]   magnesium hydroxide (MILK OF MAGNESIA) suspension 30 mL  30 mL Oral Daily PRN Derrill Center, NP       melatonin tablet 3 mg  3 mg Oral QHS Saliha Salts, NP   3 mg at 06/06/21 2115   OLANZapine (ZYPREXA) injection 5 mg  5 mg Intramuscular TID PRN Massengill, Ovid Curd, MD       OLANZapine (ZYPREXA) tablet 5 mg  5 mg Oral TID PRN Massengill, Ovid Curd, MD       OLANZapine (ZYPREXA) tablet 5 mg  5 mg Oral QHS Massengill, Nathan, MD       polyethylene glycol (MIRALAX / GLYCOLAX) packet 17 g  17 g Oral Daily PRN Nicholes Rough, NP       traZODone (DESYREL) tablet 100 mg  100 mg Oral QHS Kaiyana Bedore, NP   100 mg at 06/06/21 2115   ziprasidone (GEODON) capsule 80 mg  80 mg Oral BID WC Massengill, Ovid Curd, MD   80 mg at 06/07/21 0732    Lab Results:  No results found for this or any previous visit (from the past 48 hour(s)).   Blood Alcohol level:  Lab Results  Component Value Date   ETH <10 05/30/2021   ETH <10 123456    Metabolic Disorder Labs: Lab Results  Component Value Date   HGBA1C 5.9 (H) 06/02/2021   MPG 122.63 06/02/2021   MPG 126 02/03/2021   Lab Results  Component Value Date   PROLACTIN 17.5 (H) 06/02/2021   PROLACTIN 23.4 (H) 11/28/2018   Lab Results  Component Value Date   CHOL 137 06/02/2021   TRIG 105 06/02/2021   HDL 36 (L) 06/02/2021   CHOLHDL 3.8 06/02/2021   VLDL 21 06/02/2021    LDLCALC 80 06/02/2021   LDLCALC 80 01/30/2021    Physical Findings: AIMS: Facial and Oral Movements Muscles of Facial Expression: None, normal Lips and Perioral Area: None, normal Jaw: None, normal Tongue: None, normal,Extremity Movements Upper (arms, wrists, hands, fingers): None, normal Lower (legs, knees, ankles, toes): None, normal, Trunk Movements Neck, shoulders, hips: None, normal, Overall Severity Severity of abnormal movements (highest score from questions above): None, normal Incapacitation due to abnormal movements: None, normal Patient's awareness of abnormal movements (rate only patient's report): No Awareness, Dental Status Current problems with teeth and/or dentures?: Yes Does  patient usually wear dentures?: No  CIWA:    COWS:     Musculoskeletal: Strength & Muscle Tone: within normal limits Gait & Station: normal Patient leans: N/A  Psychiatric Specialty Exam:  Presentation  General Appearance: Appropriate for Environment  Eye Contact:Fair  Speech:Clear and Coherent  Speech Volume:Normal  Handedness:Right   Mood and Affect  Mood:Depressed  Affect:Congruent   Thought Process  Thought Processes:Coherent  Descriptions of Associations:Intact  Orientation:Full (Time, Place and Person)  Thought Content:Logical  History of Schizophrenia/Schizoaffective disorder:Yes  Duration of Psychotic Symptoms:Greater than six months  Hallucinations:Hallucinations: Auditory Description of Auditory Hallucinations: "his" & "hers"  Ideas of Reference:Paranoia  Suicidal Thoughts:Suicidal Thoughts: No  Homicidal Thoughts:Homicidal Thoughts: No  Sensorium  Memory:Immediate Good  Judgment:Fair  Insight:Fair  Executive Functions  Concentration:Fair  Attention Span:Good  [place] of Knowledge:Good  Language:Good  Psychomotor Activity  Psychomotor Activity:Psychomotor Activity: Other (comment) (left arm stiffness)  Assets   Assets:Communication Skills  Sleep  Sleep:Sleep: Fair  Physical Exam: Physical Exam Constitutional:      Appearance: Normal appearance.  HENT:     Head: Normocephalic.     Nose: Nose normal. No congestion or rhinorrhea.  Eyes:     Pupils: Pupils are equal, round, and reactive to light.  Pulmonary:     Effort: Pulmonary effort is normal.  Musculoskeletal:        General: Normal range of motion.     Cervical back: Normal range of motion.  Neurological:     General: No focal deficit present.     Mental Status: He is alert and oriented to person, place, and time.   Review of Systems  Constitutional: Negative.  Negative for fever.  HENT: Negative.  Negative for sore throat.   Eyes: Negative.   Respiratory: Negative.  Negative for cough.   Cardiovascular: Negative.   Gastrointestinal: Negative.   Genitourinary: Negative.   Musculoskeletal: Negative.   Skin: Negative.   Neurological: Negative.   Psychiatric/Behavioral:  Positive for depression and hallucinations. Negative for memory loss, substance abuse and suicidal ideas. The patient is not nervous/anxious and does not have insomnia.   Blood pressure 139/88, pulse (!) 59, temperature 98.7 F (37.1 C), temperature source Oral, resp. rate 18, height 6\' 1"  (1.854 m), weight 70.8 kg, SpO2 100 %. Body mass index is 20.58 kg/m.  Treatment Plan Summary: Daily contact with patient to assess and evaluate symptoms and progress in treatment and Medication management   Observation Level/Precautions:  15 minute checks  Laboratory:  Labs reviewed   Psychotherapy:  Unit Group sessions  Medications:  See [facility]  Consultations:  To be determined   Discharge Concerns:  Safety, medication compliance, mood stability  Estimated LOS: 5-7 days  Other:  N/A    Physician Treatment Plan for Primary Diagnosis: Schizophrenia ([place])   PLAN Safety and Monitoring: Voluntary admission to inpatient psychiatric unit for safety, stabilization and  treatment Daily contact with patient to assess and evaluate symptoms and progress in treatment Patient's case to be discussed in multi-disciplinary team meeting Observation Level : q15 minute checks Vital signs: q12 hours Precautions: Safety, No Roommate due to paranoia   Long Term Goal(s): Improvement in symptoms so as ready for discharge   Short Term Goals: Ability to verbalize feelings will improve, Ability to disclose and discuss suicidal ideas, and Compliance with prescribed medications will improve   Diagnoses Principal Problem:   Schizophrenia ([place]) Active Problems:   Anxiety state   Insomnia  Medications -Start Zyprexa 5 mg nightly for psychosis -Continue Geodon to 80 mg BID with meals for psychosis  - repeated EKG with QTC 438 & "possible pericarditis which cardiology states is r/t early repolarization  -Continue Agitation protocol as per MAR (Zyprexa PO/IM PRN) -Continue Trazodone to 100 mg nightly for insomnia -Continue Hydroxyzine 25 mg PRN TID for anxiety -Continue Melatonin 3 mg nightly for insomnia -Increase Cogentin to 1 mg BID for EPS (c/o  Left arm stiffness)    Other PRNS -Continue Tylenol 650 mg every 6 hours PRN for mild pain -Continue Maalox 30 mg every 4 hrs PRN for indigestion -Continue Milk of Magnesia as needed every 6 hrs for constipation   Discharge Planning: Social work and case management to assist with discharge planning and identification of hospital follow-up needs prior to discharge Estimated LOS: 5-7 days Discharge Concerns: Need to establish a safety plan; Medication compliance and effectiveness Discharge Goals: Return home with outpatient referrals for mental health follow-up including medication management/psychotherapy  Nicholes Rough, NP 06/07/2021, 3:04 PM Patient ID: Xavy Rambin, male   DOB: Dec 22, 1985, 36 y.o.   MRN: [id number]

## 2021-06-07 NOTE — Group Note (Signed)
Recreation Therapy Group Note   Group Topic:Animal Assisted Therapy   Group Date: 06/07/2021 Start Time: 1430 End Time: 1515 Facilitators: Victorino Sparrow, LRT,CTRS Location: [place]   AAA/T Program Assumption of Risk Form signed by Patient/ or Parent Legal Guardian Yes  Patient understands his/her participation is voluntary Yes   Affect/Mood: N/A   Participation Level: Did not attend    Clinical Observations/Individualized Feedback:    Plan: Continue to engage patient in RT group sessions 2-3x/week.   Victorino Sparrow, Glennis Brink 06/07/2021 3:51 PM

## 2021-06-08 ENCOUNTER — Encounter (HOSPITAL_COMMUNITY): Payer: Self-pay

## 2021-06-08 MED ORDER — METHOCARBAMOL 500 MG PO TABS
500.0000 mg | ORAL_TABLET | Freq: Every day | ORAL | Status: DC
  Administered 2021-06-08 – 2021-06-09 (×2): 500 mg via ORAL
  Filled 2021-06-08 (×4): qty 1

## 2021-06-08 NOTE — Progress Notes (Signed)
Pt denies SI/HI.  Pt endorses auditory hallucinations which "do no really bother me today."  Pt spoke at length with RN student and appeared to enjoy talking to student.  Pt c/o of L arm/shoulder pain.  Robaxin given per MAR.  RN will continue to monitor pt's progress and provide support as needed.

## 2021-06-08 NOTE — [facility] Group Notes (Signed)
Pt did not attend pscyhoeducational group.

## 2021-06-08 NOTE — Progress Notes (Signed)
[facility] MD Progress Note  06/08/2021 2:20 PM Wesley Peterson  MRN:  [id number]  Subjective:  Wesley Peterson reports: "I am hearing the voices still, and they are the voices of girls moaning in a sexual way. I have talked to two different people who think that the girls I've been with in the past cast a spell on me."  Reason For Admission: Wesley Peterson is a 36 y.o [place] American male with a history of Schizophrenia who presented to the [facility] with complaints of worsening auditory hallucinations, passive SI & depressive symptoms in the context of medication non compliance and homelessness. Pt was transferred voluntarily to this [facility] for treatment and stabilization of his mood.  Today's patient assessment Note: Pt's chart reviewed, his case discussed with his treatment team. Pt continues to present with a flat affect and depressed mood. His attention to personal hygiene and grooming is fair, eye contact is fair, speech is clear & coherent. Patient continues to present with paranoia, thinks that nonspecific people who were out to get him in [place] are still out to get him, but states that he feels safe here at the hospital. He reports that while in [place], there were cars chasing him. He reports +AH of girls moaning in a sexual way, and states that he has spoken to two people here who think that the girls in his past cast a spell on him. He reports that the voices are worse when he self isolates, and that he has been making lots of attempts to attend group sessions. He continues to state that people put thoughts in his brain and take some thoughts out of his brain. He denies SI/HI/VH. He continues to complain of stiffness to his left arm, and localizes to pain, and unable to raise his arm above his head. Pt denies any history of falls prior to this admission. Will order Robaxin 500 mg  daily to see if it helps with the pain in the left arm.  Pt reports that his sleep quality last night was better, and  acknowledges that he needs to come out of his room during the day, and be interactive with peers so that he can sleep well at night. He reports a good appetite. Will continue medications as listed below.  Labs Reviewed, prolactin is currently elevated at 17.5, but pt was switched from Risperdal to Geodon on admission. EKG with QTC of 438, and elevated T waves with early repolarization. Repeated showed possible pericarditis and Cardiology reviewed and stated that it is related to early repolarization. Reviewed CMP, CBC, lipid panel, Hemoglobin A1C & TSH. 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 Will need to educated on this as mental status becomes more stable & will need PCP f/u on discharge.   Principal Problem: Schizophrenia (HCC) Diagnosis: Principal Problem:   Schizophrenia (HCC) Active Problems:   Anxiety state   Insomnia  Total Time spent with patient: 30 minutes  Past Psychiatric History: As above  Past Medical History:  Past Medical History:  Diagnosis Date   Schizophrenia (HCC)    Schizophrenia, paranoid type (HCC) 02/02/2021   History reviewed. No pertinent surgical history. Family History: History reviewed. No pertinent family history. Family Psychiatric  History: none reported Social History:  Social History   Substance and Sexual Activity  Alcohol Use Not Currently   Alcohol/week: 1.0 standard drink   Types: 1 Cans of beer per week   Comment: occasional     Social History   Substance and Sexual  Activity  Drug Use Never    Social History   Socioeconomic History   Marital status: Single    Spouse name: Not on file   Number of children: 1   Years of education: Not on file   Highest education level: Not on file  Occupational History   Occupation: Unemployed  Tobacco Use   Smoking status: Never   Smokeless tobacco: Never  Vaping Use   Vaping Use: Never used  Substance and Sexual Activity   Alcohol use: Not Currently    Alcohol/week: 1.0  standard drink    Types: 1 Cans of beer per week    Comment: occasional   Drug use: Never   Sexual activity: Never  Other Topics Concern   Not on file  Social History Narrative   ** Merged History Encounter **       Pt lives with mother and other relatives.  Currently unemployed.  [facility]   Social Determinants of Health   Financial Resource Strain: Not on file  Food Insecurity: Not on file  Transportation Needs: Not on file  Physical Activity: Not on file  Stress: Not on file  Social Connections: Not on file   Additional Social History:   Sleep: Good  Appetite:  Good  Current Medications: Current Facility-Administered Medications  Medication Dose Route Frequency Provider Last Rate Last Admin   acetaminophen (TYLENOL) tablet 650 mg  650 mg Oral Q6H PRN Oneta RackLewis, Tanika N, NP   650 mg at 06/08/21 0810   alum & mag hydroxide-simeth (MAALOX/MYLANTA) 200-200-20 MG/5ML suspension 30 mL  30 mL Oral Q4H PRN Oneta RackLewis, Tanika N, NP       amLODipine (NORVASC) tablet 10 mg  10 mg Oral Daily Massengill, Harrold DonathNathan, MD   10 mg at 06/08/21 0810   benztropine (COGENTIN) tablet 1 mg  1 mg Oral BID Massengill, Harrold DonathNathan, MD   1 mg at 06/08/21 0810   hydrOXYzine (ATARAX) tablet 25 mg  25 mg Oral TID PRN Oneta RackLewis, Tanika N, NP   25 mg at 06/08/21 0932   magnesium hydroxide (MILK OF MAGNESIA) suspension 30 mL  30 mL Oral Daily PRN Oneta RackLewis, Tanika N, NP       melatonin tablet 3 mg  3 mg Oral QHS Alvetta Hidrogo, NP   3 mg at 06/07/21 2110   methocarbamol (ROBAXIN) tablet 500 mg  500 mg Oral Daily Starleen BlueNkwenti, Kiauna Zywicki, NP       OLANZapine (ZYPREXA) injection 5 mg  5 mg Intramuscular TID PRN Massengill, Harrold DonathNathan, MD       OLANZapine (ZYPREXA) tablet 5 mg  5 mg Oral TID PRN Phineas InchesMassengill, Nathan, MD       OLANZapine (ZYPREXA) tablet 5 mg  5 mg Oral QHS Massengill, Nathan, MD   5 mg at 06/07/21 2110   polyethylene glycol (MIRALAX / GLYCOLAX) packet 17 g  17 g Oral Daily PRN Starleen BlueNkwenti,  Yoshua Geisinger, NP       traZODone (DESYREL) tablet 100 mg  100 mg Oral QHS Cuinn Westerhold, NP   100 mg at 06/07/21 2110   ziprasidone (GEODON) capsule 80 mg  80 mg Oral BID WC Massengill, Harrold DonathNathan, MD   80 mg at 06/08/21 16100810    Lab Results:  No results found for this or any previous visit (from the past 48 hour(s)).   Blood Alcohol level:  Lab Results  Component Value Date   [facility]10 05/30/2021   ETH <10 02/02/2021    Metabolic Disorder Labs: Lab Results  Component Value Date   HGBA1C 5.9 (H) 06/02/2021   MPG 122.63 06/02/2021   MPG 126 02/03/2021   Lab Results  Component Value Date   PROLACTIN 17.5 (H) 06/02/2021   PROLACTIN 23.4 (H) 11/28/2018   Lab Results  Component Value Date   CHOL 137 06/02/2021   TRIG 105 06/02/2021   HDL 36 (L) 06/02/2021   CHOLHDL 3.8 06/02/2021   VLDL 21 06/02/2021   LDLCALC 80 06/02/2021   LDLCALC 80 01/30/2021    Physical Findings: AIMS: Facial and Oral Movements Muscles of Facial Expression: None, normal Lips and Perioral Area: None, normal Jaw: None, normal Tongue: None, normal,Extremity Movements Upper (arms, wrists, hands, fingers): None, normal Lower (legs, knees, ankles, toes): None, normal, Trunk Movements Neck, shoulders, hips: None, normal, Overall Severity Severity of abnormal movements (highest score from questions above): None, normal Incapacitation due to abnormal movements: None, normal Patient's awareness of abnormal movements (rate only patient's report): No Awareness, Dental Status Current problems with teeth and/or dentures?: Yes Does patient usually wear dentures?: No  CIWA:   n/a COWS:   n/a AIMS:0 Musculoskeletal: Strength & Muscle Tone: within normal limits Gait & Station: normal Patient leans: N/A  Psychiatric Specialty Exam:  Presentation  General Appearance: Appropriate for Environment; Fairly Groomed  Eye Contact:Fair  Speech:Clear and Coherent  Speech Volume:Normal  Handedness:Right   Mood and  Affect  Mood:Euthymic  Affect:Appropriate   Thought Process  Thought Processes:Coherent  Descriptions of Associations:Intact  Orientation:Full (Time, Place and Person)  Thought Content:Paranoid Ideation  History of Schizophrenia/Schizoaffective disorder:Yes  Duration of Psychotic Symptoms:Greater than six months  Hallucinations:Hallucinations: Auditory Description of Auditory Hallucinations: Girls moaning  Ideas of Reference:Paranoia  Suicidal Thoughts:Suicidal Thoughts: No  Homicidal Thoughts:Homicidal Thoughts: No  Sensorium  Memory:Immediate Good  Judgment:Fair  Insight:Fair  Executive Functions  Concentration:Fair  Attention Span:Good  Recall:Good  Fund of Knowledge:Good  Language:Good  Psychomotor Activity  Psychomotor Activity:Psychomotor Activity: Normal  Assets  Assets:Communication Skills  Sleep  Sleep:Sleep: Fair  Physical Exam: Physical Exam Constitutional:      Appearance: Normal appearance.  HENT:     Head: Normocephalic.     Nose: Nose normal. No congestion or rhinorrhea.  Eyes:     Pupils: Pupils are equal, round, and reactive to light.  Pulmonary:     Effort: Pulmonary effort is normal.  Musculoskeletal:        General: Normal range of motion.     Cervical back: Normal range of motion.  Neurological:     General: No focal deficit present.     Mental Status: He is alert and oriented to person, place, and time.   Review of Systems  Constitutional: Negative.  Negative for fever.  HENT: Negative.  Negative for sore throat.   Eyes: Negative.   Respiratory: Negative.  Negative for cough.   Cardiovascular: Negative.   Gastrointestinal: Negative.   Genitourinary: Negative.   Musculoskeletal: Negative.   Skin: Negative.   Neurological: Negative.   Psychiatric/Behavioral:  Positive for depression and hallucinations. Negative for memory loss, substance abuse and suicidal ideas. The patient is not nervous/anxious and does not  have insomnia.   Blood pressure 120/78, pulse 79, temperature 98.3 F (36.8 C), temperature source Oral, resp. rate 18, height 6\' 1"  (1.854 m), weight 70.8 kg, SpO2 98 %. Body mass index is 20.58 kg/m.  Treatment Plan Summary: Daily contact with patient to assess and evaluate symptoms and progress in treatment and Medication management   Observation Level/Precautions:  15 minute checks  Laboratory:  Labs reviewed   Psychotherapy:  Unit Group sessions  Medications:  See [facility]  Consultations:  To be determined   Discharge Concerns:  Safety, medication compliance, mood stability  Estimated LOS: 5-7 days  Other:  N/A    Physician Treatment Plan for Primary Diagnosis: Schizophrenia (HCC)   PLAN Safety and Monitoring: Voluntary admission to inpatient psychiatric unit for safety, stabilization and treatment Daily contact with patient to assess and evaluate symptoms and progress in treatment Patient's case to be discussed in multi-disciplinary team meeting Observation Level : q15 minute checks Vital signs: q12 hours Precautions: Safety, No Roommate due to paranoia   Long Term Goal(s): Improvement in symptoms so as ready for discharge   Short Term Goals: Ability to verbalize feelings will improve, Ability to disclose and discuss suicidal ideas, and Compliance with prescribed medications will improve   Diagnoses Principal Problem:   Schizophrenia (HCC) Active Problems:   Anxiety state   Insomnia                       Medications -Start Robaxin 500 mg daily for left shoulder pain -Continue Zyprexa 5 mg nightly for psychosis -Continue Geodon to 80 mg BID with meals for psychosis  - repeated EKG with QTC 438 & "possible pericarditis which cardiology states is r/t early repolarization  -Continue Agitation protocol as per MAR (Zyprexa PO/IM PRN) -Continue Trazodone to 100 mg nightly for insomnia -Continue Hydroxyzine 25 mg PRN TID for anxiety -Continue Melatonin 3 mg nightly for  insomnia -Continue Cogentin to 1 mg BID for EPS (c/o  Left arm stiffness)   Other PRNS -Continue Tylenol 650 mg every 6 hours PRN for mild pain -Continue Maalox 30 mg every 4 hrs PRN for indigestion -Continue Milk of Magnesia as needed every 6 hrs for constipation   Discharge Planning: Social work and case management to assist with discharge planning and identification of hospital follow-up needs prior to discharge Estimated LOS: 5-7 days Discharge Concerns: Need to establish a safety plan; Medication compliance and effectiveness Discharge Goals: Return home with outpatient referrals for mental health follow-up including medication management/psychotherapy  Starleen Blue, NP 06/08/2021, 2:20 PM Patient ID: Wesley Peterson, male   DOB: [birth year], 36 y.o.   MRN: [id number]

## 2021-06-08 NOTE — Plan of Care (Signed)
Patient has stayed in room and appeared to be preoccupied but denied hallucinations. Denied SI/HI. Pleasant on approach and maintained a positive attitude during conversation with staff. Presented to the medication window and received his HS medications. Did not express any concerns. Support and encouragements provided.

## 2021-06-08 NOTE — BH IP Treatment Plan (Signed)
Interdisciplinary Treatment and Diagnostic Plan Update  06/08/2021 Time of Session: 0830 Agamjot Mazzara MRN: [id number]  Principal Diagnosis: Schizophrenia [facility])  Secondary Diagnoses: Principal Problem:   Schizophrenia (HCC) Active Problems:   Anxiety state   Insomnia   Current Medications:  Current Facility-Administered Medications  Medication Dose Route Frequency Provider Last Rate Last Admin   acetaminophen (TYLENOL) tablet 650 mg  650 mg Oral Q6H PRN Oneta Rack, NP   650 mg at 06/08/21 0810   alum & mag hydroxide-simeth (MAALOX/MYLANTA) 200-200-20 MG/5ML suspension 30 mL  30 mL Oral Q4H PRN Oneta Rack, NP       amLODipine (NORVASC) tablet 10 mg  10 mg Oral Daily Massengill, Harrold Donath, MD   10 mg at 06/08/21 0810   benztropine (COGENTIN) tablet 1 mg  1 mg Oral BID Massengill, Harrold Donath, MD   1 mg at 06/08/21 0810   hydrOXYzine (ATARAX) tablet 25 mg  25 mg Oral TID PRN Oneta Rack, NP   25 mg at 06/08/21 0932   magnesium hydroxide (MILK OF MAGNESIA) suspension 30 mL  30 mL Oral Daily PRN Oneta Rack, NP       melatonin tablet 3 mg  3 mg Oral QHS Nkwenti, Doris, NP   3 mg at 06/07/21 2110   OLANZapine (ZYPREXA) injection 5 mg  5 mg Intramuscular TID PRN Massengill, Harrold Donath, MD       OLANZapine (ZYPREXA) tablet 5 mg  5 mg Oral TID PRN Phineas Inches, MD       OLANZapine (ZYPREXA) tablet 5 mg  5 mg Oral QHS Massengill, Harrold Donath, MD   5 mg at 06/07/21 2110   polyethylene glycol (MIRALAX / GLYCOLAX) packet 17 g  17 g Oral Daily PRN Starleen Blue, NP       traZODone (DESYREL) tablet 100 mg  100 mg Oral QHS Starleen Blue, NP   100 mg at 06/07/21 2110   ziprasidone (GEODON) capsule 80 mg  80 mg Oral BID WC Massengill, Harrold Donath, MD   80 mg at 06/08/21 0810   PTA Medications: Medications Prior to Admission  Medication Sig Dispense Refill Last Dose   benztropine (COGENTIN) 1 MG tablet Take 1 tablet (1 mg total) by mouth 2 (two) times daily as needed for tremors (Muscle stiffness  and pain (EPS)). 60 tablet 0    FLUoxetine (PROZAC) 20 MG capsule Take 1 capsule (20 mg total) by mouth daily. 30 capsule 0    haloperidol decanoate (HALDOL DECANOATE) 100 MG/ML injection Inject 1 mL (100 mg total) into the muscle every 28 (twenty-eight) days for 28 days. 1 mL 0    melatonin 3 MG TABS tablet Take 1 tablet (3 mg total) by mouth at bedtime.  0    OLANZapine (ZYPREXA) 15 MG tablet Take 1 tablet (15 mg total) by mouth at bedtime. 30 tablet 0    risperiDONE (RISPERDAL) 3 MG tablet Take 3 mg by mouth 2 (two) times daily.       Patient Stressors: Other: AVH    Patient Strengths: Motivation for treatment/growth  Supportive family/friends   Treatment Modalities: Medication Management, Group therapy, Case management,  1 to 1 session with clinician, Psychoeducation, Recreational therapy.   Physician Treatment Plan for Primary Diagnosis: Schizophrenia (HCC) Long Term Goal(s): Improvement in symptoms so as ready for discharge   Short Term Goals: Ability to verbalize feelings will improve Ability to disclose and discuss suicidal ideas Compliance with prescribed medications will improve  Medication Management: Evaluate patient's response, side effects, and tolerance  of medication regimen.  Therapeutic Interventions: 1 to 1 sessions, Unit Group sessions and Medication administration.  Evaluation of Outcomes: Progressing  Physician Treatment Plan for Secondary Diagnosis: Principal Problem:   Schizophrenia ([place]) Active Problems:   Anxiety state   Insomnia  Long Term Goal(s): Improvement in symptoms so as ready for discharge   Short Term Goals: Ability to verbalize feelings will improve Ability to disclose and discuss suicidal ideas Compliance with prescribed medications will improve     Medication Management: Evaluate patient's response, side effects, and tolerance of medication regimen.  Therapeutic Interventions: 1 to 1 sessions, Unit Group sessions and Medication  administration.  Evaluation of Outcomes: Progressing   RN Treatment Plan for Primary Diagnosis: Schizophrenia ([place]) Long Term Goal(s): Knowledge of disease and therapeutic regimen to maintain health will improve  Short Term Goals: Ability to remain free from injury will improve, Ability to verbalize frustration and anger appropriately will improve, Ability to demonstrate self-control, Ability to participate in decision making will improve, Ability to verbalize feelings will improve, Ability to disclose and discuss suicidal ideas, Ability to identify and develop effective coping behaviors will improve, and Compliance with prescribed medications will improve  Medication Management: RN will administer medications as ordered by provider, will assess and evaluate patient's response and provide education to patient for prescribed medication. RN will report any adverse and/or side effects to prescribing provider.  Therapeutic Interventions: 1 on 1 counseling sessions, Psychoeducation, Medication administration, Evaluate responses to treatment, Monitor vital signs and CBGs as ordered, Perform/monitor CIWA, COWS, AIMS and Fall Risk screenings as ordered, Perform wound care treatments as ordered.  Evaluation of Outcomes: Progressing   LCSW Treatment Plan for Primary Diagnosis: Schizophrenia ([place]) Long Term Goal(s): Safe transition to appropriate next level of care at discharge, Engage patient in therapeutic group addressing interpersonal concerns.  Short Term Goals: Engage patient in aftercare planning with referrals and resources, Increase social support, Increase ability to appropriately verbalize feelings, Increase emotional regulation, Facilitate acceptance of mental health diagnosis and concerns, Facilitate patient progression through stages of change regarding substance use diagnoses and concerns, Identify triggers associated with mental health/substance abuse issues, and Increase skills for wellness  and recovery  Therapeutic Interventions: Assess for all discharge needs, 1 to 1 time with Social worker, Explore available resources and support systems, Assess for adequacy in community support network, Educate family and significant other(s) on suicide prevention, Complete Psychosocial Assessment, Interpersonal group therapy.  Evaluation of Outcomes: Progressing   Progress in Treatment: Attending groups: No. Participating in groups: No. Taking medication as prescribed: Yes. Toleration medication: Yes. Family/Significant other contact made: No, will contact:  Patient has declined consent for CSW to reach collateral.  Patient understands diagnosis: Yes. Discussing patient identified problems/goals with staff: Yes. Medical problems stabilized or resolved: Yes. Denies suicidal/homicidal ideation: No. Issues/concerns per patient self-inventory: Yes. Other: none  New problem(s) identified: No, Describe:  No additional problems/concerns identified at this time.   New Short Term/Long Term Goal(s): Patient to work towards detox, elimination of symptoms of psychosis, medication management for mood stabilization; elimination of SI thoughts; development of comprehensive mental wellness/sobriety plan.  Patient Goals: No additional goals identified at this time. Patient to continue to work towards original goals identified in initial treatment team meeting. CSW will remain available to patient should they voice additional treatment goals.   Discharge Plan or Barriers: Patient is homeless, CSW to have further conversations with patient regarding housing resources. Situation ongoing, CSW will continue to monitor and update note as more information  becomes available.   Reason for Continuation of Hospitalization: Hallucinations Suicidal ideation  Estimated Length of Stay: 1-7 days    Scribe for Treatment Team: Larose Kells 06/08/2021 10:05 AM

## 2021-06-08 NOTE — Group Note (Signed)
Recreation Therapy Group Note   Group Topic:Team Building  Group Date: 06/08/2021 Start Time: 0930 End Time: 0956 Facilitators: Caroll Rancher, LRT,CTRS Location: [address]   Goal Area(s) Addresses:  Patient will effectively work with peer towards shared goal.  Patient will identify skills used to make activity successful.  Patient will identify how skills used during activity can be applied to reach post d/c goals.   Group Description: [employer]. In teams of 3-4, patients were given 25 small craft pipe cleaners. Using the materials provided, patients were instructed to compete again the opposing team(s) to build the tallest free-standing structure from floor level. The activity was timed; difficulty increased by Clinical research associate as Production designer, theatre/television/film continued.  Systematically resources were removed with additional directions for example, placing one arm behind their back, working in silence, and shape stipulations. LRT facilitated post-activity discussion reviewing team processes and necessary communication skills involved in completion. Patients were encouraged to reflect how the skills utilized, or not utilized, in this activity can be incorporated to positively impact support systems post discharge.   Affect/Mood: N/A   Participation Level: Did not attend    Clinical Observations/Individualized Feedback:     Plan: Continue to engage patient in RT group sessions 2-3x/week.   Caroll Rancher, Antonietta Jewel  06/08/2021 12:26 PM

## 2021-06-08 NOTE — Group Note (Signed)
LCSW Group Therapy Note   Group Date: 06/08/2021 Start Time: 1300 End Time: 1400   Type of Therapy and Topic:  Group Therapy:  Strengths Exploration   Participation Level: Did Not Attend  Description of Group: This group allows individuals to explore their strengths, learn to use strengths in new ways to improve well-being. Strengths-based interventions involve identifying strengths, understanding how they are used, and learning new ways to apply them. Individuals will identify their strengths, and then explore their roles in different areas of life (relationships, professional life, and personal fulfillment). Individuals will think about ways in which they currently use their strengths, along with new ways they could begin using them.    Therapeutic Goals Patient will verbalize two of their strengths Patient will identify how their strengths are currently used Patient will identify two new ways to apply their strengths  Patients will create a plan to apply their strengths in their daily lives     Summary of Patient Progress:  Did not attend     Therapeutic Modalities Cognitive [place], Latanya Presser 06/08/2021  1:57 PM

## 2021-06-09 MED ORDER — OLANZAPINE 7.5 MG PO TABS
7.5000 mg | ORAL_TABLET | Freq: Every day | ORAL | Status: DC
  Administered 2021-06-09: 7.5 mg via ORAL
  Filled 2021-06-09 (×2): qty 1

## 2021-06-09 MED ORDER — METHOCARBAMOL 500 MG PO TABS
500.0000 mg | ORAL_TABLET | Freq: Two times a day (BID) | ORAL | Status: DC
  Administered 2021-06-09 – 2021-06-10 (×2): 500 mg via ORAL
  Filled 2021-06-09 (×4): qty 1

## 2021-06-09 NOTE — Progress Notes (Addendum)
[facility] MD Progress Note  06/09/2021 12:27 PM Wesley Peterson  MRN:  [id number]  Subjective:  Wesley Peterson reports: "I am hearing the voices doing the same things."  Reason For Admission: Wesley Peterson is a 36 y.o [place] American male with a history of Schizophrenia who presented to the [facility] ER with complaints of worsening auditory hallucinations, passive SI & depressive symptoms in the context of medication non compliance and homelessness. Pt was transferred voluntarily to this [facility] for treatment and stabilization of his mood.  Today's patient assessment Note: Pt's chart reviewed, his case discussed with his treatment team. Pt continues to present with a flat affect and depressed mood. His attention to personal hygiene and grooming is fair, eye contact is fair, speech is clear & coherent. Patient is continuing to reports +auditory hallucinations of voices which are those of women, & moaning. He reports to Clinical research associate that these voices are distressing to him & bothersome, and that he wants them to stop. When asked questions related to paranoia, pt states that there were people in [place] following in around in a car, but he is unsure if they are still there looking for him. He reports feeling safe in this hospital. Pt presents today with thought broadcasting, states that he feels as though other people know what he is thinking about because his thoughts are so loud. He also presents with thought insertion and thought withdrawal, and states to writer that people put thoughts in his head and take some out. He reports that people started doing these four months ago. Continuous hospitalization is necessary to treat and stabilize these symptoms, and also allow time for discharge planning to ensure safety since pt is homeless with very limited resources available to him in [place], [place].   Left arm stiffness is slightly improved today with the addition of the daily Robaxin 500 mg. Will increase this medication to BID to  maximize benefits for pt, as he is showing improvement on it. Pt has been educated on range of motion exercises, and a consult to physical therapy has been placed to evaluate LUE. Pt is currently reporting a good sleep quality, and reporting appetite as being fair. Will increase nightly Zyprexa dose to 7.5 mg for management of psychosis. Pt with no tremors noted.   Labs Reviewed, prolactin is currently elevated at 17.5, but pt was switched from Risperdal to Geodon on admission. EKG with QTC of 438, and elevated T waves with early repolarization. Repeated showed possible pericarditis and Cardiology reviewed and stated that it is related to early repolarization. Reviewed CMP, CBC, lipid panel, Hemoglobin A1C & TSH. 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 Will need to educated on this as mental status becomes more stable & will need PCP f/u on discharge.   Principal Problem: Schizophrenia (HCC) Diagnosis: Principal Problem:   Schizophrenia (HCC) Active Problems:   Anxiety state   Insomnia  Total Time spent with patient: 30 minutes  Past Psychiatric History: As above  Past Medical History:  Past Medical History:  Diagnosis Date   Schizophrenia (HCC)    Schizophrenia, paranoid type (HCC) 02/02/2021   History reviewed. No pertinent surgical history. Family History: History reviewed. No pertinent family history. Family Psychiatric  History: none reported Social History:  Social History   Substance and Sexual Activity  Alcohol Use Not Currently   Alcohol/week: 1.0 standard drink   Types: 1 Cans of beer per week   Comment: occasional     Social History   Substance and  Sexual Activity  Drug Use Never    Social History   Socioeconomic History   Marital status: Single    Spouse name: Not on file   Number of children: 1   Years of education: Not on file   Highest education level: Not on file  Occupational History   Occupation: Unemployed  Tobacco Use   Smoking status:  Never   Smokeless tobacco: Never  Vaping Use   Vaping Use: Never used  Substance and Sexual Activity   Alcohol use: Not Currently    Alcohol/week: 1.0 standard drink    Types: 1 Cans of beer per week    Comment: occasional   Drug use: Never   Sexual activity: Never  Other Topics Concern   Not on file  Social History Narrative   ** Merged History Encounter **       Pt lives with mother and other relatives.  Currently unemployed.  [facility]   Social Determinants of Health   Financial Resource Strain: Not on file  Food Insecurity: Not on file  Transportation Needs: Not on file  Physical Activity: Not on file  Stress: Not on file  Social Connections: Not on file   Additional Social History:   Sleep: Good  Appetite:  Good  Current Medications: Current Facility-Administered Medications  Medication Dose Route Frequency Provider Last Rate Last Admin   acetaminophen (TYLENOL) tablet 650 mg  650 mg Oral Q6H PRN Oneta RackLewis, Tanika N, NP   650 mg at 06/08/21 0810   alum & mag hydroxide-simeth (MAALOX/MYLANTA) 200-200-20 MG/5ML suspension 30 mL  30 mL Oral Q4H PRN Oneta RackLewis, Tanika N, NP       amLODipine (NORVASC) tablet 10 mg  10 mg Oral Daily Massengill, Harrold DonathNathan, MD   10 mg at 06/09/21 0844   benztropine (COGENTIN) tablet 1 mg  1 mg Oral BID Massengill, Harrold DonathNathan, MD   1 mg at 06/09/21 0844   hydrOXYzine (ATARAX) tablet 25 mg  25 mg Oral TID PRN Oneta RackLewis, Tanika N, NP   25 mg at 06/08/21 0932   magnesium hydroxide (MILK OF MAGNESIA) suspension 30 mL  30 mL Oral Daily PRN Oneta RackLewis, Tanika N, NP       melatonin tablet 3 mg  3 mg Oral QHS Starleen BlueNkwenti, Ladina Shutters, NP   3 mg at 06/08/21 2145   methocarbamol (ROBAXIN) tablet 500 mg  500 mg Oral BID Starleen BlueNkwenti, Sherrilyn Nairn, NP       OLANZapine (ZYPREXA) injection 5 mg  5 mg Intramuscular TID PRN Massengill, Harrold DonathNathan, MD       OLANZapine (ZYPREXA) tablet 5 mg  5 mg Oral TID PRN Massengill, Harrold DonathNathan, MD       OLANZapine (ZYPREXA)  tablet 7.5 mg  7.5 mg Oral QHS Riley Papin, NP       polyethylene glycol (MIRALAX / GLYCOLAX) packet 17 g  17 g Oral Daily PRN Starleen BlueNkwenti, Daeshaun Specht, NP       traZODone (DESYREL) tablet 100 mg  100 mg Oral QHS Tamryn Popko, NP   100 mg at 06/08/21 2145   ziprasidone (GEODON) capsule 80 mg  80 mg Oral BID WC Massengill, Harrold DonathNathan, MD   80 mg at 06/09/21 0844    Lab Results:  No results found for this or any previous visit (from the past 48 hour(s)).   Blood Alcohol level:  Lab Results  [facility]   [facility]   ETH <10 02/02/2021    Metabolic Disorder Labs: Lab Results  Component  Value Date   HGBA1C 5.9 (H) 06/02/2021   MPG 122.63 06/02/2021   MPG 126 02/03/2021   Lab Results  Component Value Date   PROLACTIN 17.5 (H) 06/02/2021   PROLACTIN 23.4 (H) 11/28/2018   Lab Results  Component Value Date   CHOL 137 06/02/2021   TRIG 105 06/02/2021   HDL 36 (L) 06/02/2021   CHOLHDL 3.8 06/02/2021   VLDL 21 06/02/2021   LDLCALC 80 06/02/2021   LDLCALC 80 01/30/2021    Physical Findings: AIMS: Facial and Oral Movements Muscles of Facial Expression: None, normal Lips and Perioral Area: None, normal Jaw: None, normal Tongue: None, normal,Extremity Movements Upper (arms, wrists, hands, fingers): None, normal Lower (legs, knees, ankles, toes): None, normal, Trunk Movements Neck, shoulders, hips: None, normal, Overall Severity Severity of abnormal movements (highest score from questions above): None, normal Incapacitation due to abnormal movements: None, normal Patient's awareness of abnormal movements (rate only patient's report): No Awareness, Dental Status Current problems with teeth and/or dentures?: Yes Does patient usually wear dentures?: No  CIWA:   n/a COWS:   n/a AIMS:0 Musculoskeletal: Strength & Muscle Tone: within normal limits Gait & Station: normal Patient leans: N/A  Psychiatric Specialty Exam:  Presentation  General Appearance: Appropriate  for Environment; Fairly Groomed  Eye Contact:Fair  Speech:Clear and Coherent  Speech Volume:Normal  Handedness:Right  Mood and Affect  Mood:Depressed  Affect:Congruent  Thought Process  Thought Processes:Coherent  Descriptions of Associations:Intact  Orientation:Full (Time, Place and Person)  Thought Content:Paranoid Ideation  History of Schizophrenia/Schizoaffective disorder:No  Duration of Psychotic Symptoms:Greater than six months  Hallucinations:Hallucinations: Auditory Description of Auditory Hallucinations: girls moaning  Ideas of Reference:Paranoia; Delusions  Suicidal Thoughts:Suicidal Thoughts: No  Homicidal Thoughts:Homicidal Thoughts: No  Sensorium  Memory:Immediate Good  Judgment:Fair  Insight:Poor; Fair  Art therapist  Concentration:Good  Attention Span:Good  Recall:Fair  [employer] of Knowledge:Fair  Language:Good  Psychomotor Activity  Psychomotor Activity:Psychomotor Activity: -- (left shoulder stiffness)  Assets  Assets:Communication Skills  Sleep  Sleep:Sleep: Good  Physical Exam: Physical Exam Constitutional:      Appearance: Normal appearance.  HENT:     Head: Normocephalic.     Nose: Nose normal. No congestion or rhinorrhea.  Eyes:     Pupils: Pupils are equal, round, and reactive to light.  Pulmonary:     Effort: Pulmonary effort is normal.  Musculoskeletal:        General: Normal range of motion.     Cervical back: Normal range of motion.  Neurological:     General: No focal deficit present.     Mental Status: He is alert and oriented to person, place, and time.   Review of Systems  Constitutional: Negative.  Negative for fever.  HENT: Negative.  Negative for sore throat.   Eyes: Negative.   Respiratory: Negative.  Negative for cough.   Cardiovascular: Negative.   Gastrointestinal: Negative.   Genitourinary: Negative.   Musculoskeletal: Negative.   Skin: Negative.   Neurological: Negative.    Psychiatric/Behavioral:  Positive for depression and hallucinations. Negative for memory loss, substance abuse and suicidal ideas. The patient is not nervous/anxious and does not have insomnia.   Blood pressure 111/74, pulse 85, temperature 97.7 F (36.5 C), temperature source Oral, resp. rate 18, height 6\' 1"  (1.854 m), weight 70.8 kg, SpO2 98 %. Body mass index is 20.58 kg/m.  Treatment Plan Summary: Daily contact with patient to assess and evaluate symptoms and progress in treatment and Medication management   Observation Level/Precautions:  15 minute checks  Laboratory:  Labs reviewed   Psychotherapy:  Unit Group sessions  Medications:  See [facility]  Consultations:  To be determined   Discharge Concerns:  Safety, medication compliance, mood stability  Estimated LOS: 5-7 days  Other:  N/A    Physician Treatment Plan for Primary Diagnosis: Schizophrenia (HCC)   PLAN Safety and Monitoring: Voluntary admission to inpatient psychiatric unit for safety, stabilization and treatment Daily contact with patient to assess and evaluate symptoms and progress in treatment Patient's case to be discussed in multi-disciplinary team meeting Observation Level : q15 minute checks Vital signs: q12 hours Precautions: Safety, No Roommate due to paranoia   Long Term Goal(s): Improvement in symptoms so as ready for discharge   Short Term Goals: Ability to verbalize feelings will improve, Ability to disclose and discuss suicidal ideas, and Compliance with prescribed medications will improve   Diagnoses Principal Problem:   Schizophrenia (HCC) Active Problems:   Anxiety state   Insomnia                       Medications -Increase Robaxin to 500 mg BID for left shoulder pain -Increase Zyprexa to 7.5 mg nightly for psychosis -Continue Geodon to 80 mg BID with meals for psychosis  - repeated EKG with QTC 438 & "possible pericarditis which cardiology states is r/t early repolarization  -Continue  Agitation protocol as per MAR (Zyprexa PO/IM PRN) -Continue Trazodone to 100 mg nightly for insomnia -Continue Hydroxyzine 25 mg PRN TID for anxiety -Continue Melatonin 3 mg nightly for insomnia -Continue Cogentin to 1 mg BID for EPS (c/o  Left arm stiffness)   Other PRNS -Continue Tylenol 650 mg every 6 hours PRN for mild pain -Continue Maalox 30 mg every 4 hrs PRN for indigestion -Continue Milk of Magnesia as needed every 6 hrs for constipation   Discharge Planning: Social work and case management to assist with discharge planning and identification of hospital follow-up needs prior to discharge Estimated LOS: 5-7 days Discharge Concerns: Need to establish a safety plan; Medication compliance and effectiveness Discharge Goals: Return home with outpatient referrals for mental health follow-up including medication management/psychotherapy  Starleen Blue, NP 06/09/2021, 12:27 PM Patient ID: Harshan Kearley, male   DOB: 1985/12/02, 36 y.o.   MRN: [id number]

## 2021-06-09 NOTE — Plan of Care (Signed)
?  Problem: Education: ?Goal: Knowledge of the prescribed therapeutic regimen will improve ?Outcome: Progressing ?  ?Problem: Coping: ?Goal: Coping ability will improve ?Outcome: Progressing ?Goal: Will verbalize feelings ?Outcome: Progressing ?  ?

## 2021-06-09 NOTE — Progress Notes (Signed)
     06/09/21 2055  Psych Admission Type (Psych Patients Only)  Admission Status Voluntary  Psychosocial Assessment  Patient Complaints Anxiety  Eye Contact Brief  Facial Expression Flat  Affect Depressed  Speech Soft;Logical/coherent  Interaction Cautious  Motor Activity Slow  Appearance/Hygiene Unremarkable  Behavior Characteristics Cooperative  Mood Pleasant;Depressed  Thought Process  Coherency WDL  Content WDL  Delusions Paranoid  Perception Hallucinations  Hallucination Auditory  Judgment Impaired  Confusion None  Danger to Self  Current suicidal ideation? Denies  Danger to Others  Danger to Others None reported or observed

## 2021-06-09 NOTE — Progress Notes (Signed)
Adult Psychoeducational Group Note  Date:  06/09/2021 Time:  9:24 PM  Group Topic/Focus:  Wrap-Up Group:   The focus of this group is to help patients review their daily goal of treatment and discuss progress on daily workbooks.  Participation Level:  Active  Participation Quality:  Appropriate  Affect:  Appropriate  Cognitive:  Appropriate  Insight: Appropriate  Engagement in Group:  Engaged  Modes of Intervention:  Discussion  Additional Comments:  patient said his day overall 8. The best thing about his day talk to family about concerns and things that are happening.  Charna Busman Long 06/09/2021, 9:24 PM

## 2021-06-09 NOTE — Progress Notes (Signed)
Adult Psychoeducational Group Note  Date:  06/09/2021 Time:  8:58 PM  Group Topic/Focus:  Wrap-Up Group:   The focus of this group is to help patients review their daily goal of treatment and discuss progress on daily workbooks.  Participation Level:  Active  Participation Quality:  Appropriate  Affect:  Appropriate  Cognitive:  Appropriate  Insight: Appropriate  Engagement in Group:  Engaged  Modes of Intervention:  Discussion  Additional Comments:  his day was 7. The best part his day talking to his family  Chauncey Fischer 06/09/2021, 8:58 PM

## 2021-06-10 MED ORDER — BENZTROPINE MESYLATE 1 MG PO TABS
1.0000 mg | ORAL_TABLET | Freq: Every evening | ORAL | 0 refills | Status: DC
Start: 2021-06-10 — End: 2021-07-22

## 2021-06-10 MED ORDER — OLANZAPINE 7.5 MG PO TABS
7.5000 mg | ORAL_TABLET | Freq: Every day | ORAL | 0 refills | Status: DC
Start: 2021-06-10 — End: 2021-07-22

## 2021-06-10 NOTE — Discharge Summary (Signed)
Physician Discharge Summary Note  Patient:  Wesley Peterson is an 36 y.o., male MRN:  [id number] DOB:  03-23-1985 Patient phone:  [phone] (home)  Patient address:   [address] [place] [address],  Total Time spent with patient: 15 minutes  Date of Admission:  06/01/2021 Date of Discharge: 06/10/2021  Reason for Admission:  Per admission assessment note: Wesley Peterson is a 36 y.o [place] American male with a history of Schizophrenia who presented to the [facility] ER with complaints of worsening auditory hallucinations, passive SI & depressive symptoms in the context of medication non compliance and homelessness. Pt was transferred voluntarily to this [facility] for treatment and stabilization of his mood,   Evaluation at Discharge: Yaw was seen and evaluated by this practitioner and attending psychiatrist MD Lucianne Muss.  Patient observed sitting in dayroom interacting with staff.  denying suicidal or homicidal ideations.  Denies auditory visual hallucinations.  Reports he is medication compliant denying any medication side effects.  Patient reports he has plans to reside with his mother and [facility] [place].  We will make medication refills available additionally patient will be provided with 7-day samples.  He denied any other concerns prior to discharge.  CSW to follow-up with patient's mother for discharge planning.  Support,encouragement and  reassurance was provided.  Principal Problem: Schizophrenia [facility]) Discharge Diagnoses: Principal Problem:   Schizophrenia (HCC) Active Problems:   Anxiety state   Insomnia   Past Psychiatric History:   Past Medical History:  Past Medical History:  Diagnosis Date   Schizophrenia (HCC)    Schizophrenia, paranoid type (HCC) 02/02/2021   History reviewed. No pertinent surgical history. Family History: History reviewed. No pertinent family history. Family Psychiatric  History:  Social History:  Social History   Substance and Sexual  Activity  Alcohol Use Not Currently   Alcohol/week: 1.0 standard drink   Types: 1 Cans of beer per week   Comment: occasional     Social History   Substance and Sexual Activity  Drug Use Never    Social History   Socioeconomic History   Marital status: Single    Spouse name: Not on file   Number of children: 1   Years of education: Not on file   Highest education level: Not on file  Occupational History   Occupation: Unemployed  Tobacco Use   Smoking status: Never   Smokeless tobacco: Never  Vaping Use   Vaping Use: Never used  Substance and Sexual Activity   Alcohol use: Not Currently    Alcohol/week: 1.0 standard drink    Types: 1 Cans of beer per week    Comment: occasional   Drug use: Never   Sexual activity: Never  Other Topics Concern   Not on file  Social History Narrative   ** Merged History Encounter **       Pt lives with mother and other relatives.  Currently unemployed.  Receives outpatient psychiatry services through [facility].   Social Determinants of Health   Financial Resource Strain: Not on file  Food Insecurity: Not on file  Transportation Needs: Not on file  Physical Activity: Not on file  Stress: Not on file  Social Connections: Not on file    Hospital Course:  Manjot Hinks was admitted for Schizophrenia [facility]) , with psychosis and crisis management.  Pt was treated discharged with the medications listed below under Medication List.  Medical problems were identified and treated as needed.  Home medications were restarted as  appropriate.  Improvement was monitored by observation and Felipa EmoryMatt Trella 's daily report of symptom reduction.  Emotional and mental status was monitored by daily self-inventory reports completed by Felipa EmoryMatt Hardgrave and clinical staff.         Felipa EmoryMatt Gendreau was evaluated by the treatment team for stability and plans for continued recovery upon discharge. Felipa EmoryMatt Angelillo 's motivation was an integral factor for scheduling further treatment.  Employment, transportation, bed availability, health status, family support, and any pending legal issues were also considered during hospital stay. Pt was offered further treatment options upon discharge including but not limited to Residential, Intensive Outpatient, and Outpatient treatment.  Felipa EmoryMatt Swaby will follow up with the services as listed below under Follow Up Information.     Upon completion of this admission the patient was both mentally and medically stable for discharge denying suicidal/homicidal ideation, auditory/visual/tactile hallucinations, delusional thoughts and paranoia.    Deep Fringer responded well to treatment with Zyprexa, and Cogentin without adverse effects.Pt demonstrated improvement without reported or observed adverse effects to the point of stability appropriate for outpatient management. Pertinent labs include: Lipid, A1C 5.9 and elevated prolaction 17.5  for which outpatient follow-up is necessary for lab recheck as mentioned below. Reviewed CBC, CMP, BAL, and UDS; all unremarkable aside from noted exceptions.    Physical Findings: AIMS: Facial and Oral Movements Muscles of Facial Expression: None, normal Lips and Perioral Area: None, normal Jaw: None, normal Tongue: None, normal,Extremity Movements Upper (arms, wrists, hands, fingers): None, normal Lower (legs, knees, ankles, toes): None, normal, Trunk Movements Neck, shoulders, hips: None, normal, Overall Severity Severity of abnormal movements (highest score from questions above): None, normal Incapacitation due to abnormal movements: None, normal Patient's awareness of abnormal movements (rate only patient's report): No Awareness, Dental Status Current problems with teeth and/or dentures?: Yes Does patient usually wear dentures?: No  CIWA:    COWS:     Musculoskeletal: Strength & Muscle Tone: within normal limits Gait & Station: normal Patient leans: N/A   Psychiatric Specialty Exam:  Presentation   General Appearance: Appropriate for Environment  Eye Contact:Good  Speech:Clear and Coherent  Speech Volume:Normal  Handedness:Right   Mood and Affect  Mood:Anxious  Affect:Congruent   Thought Process  Thought Processes:Coherent  Descriptions of Associations:Intact  Orientation:Full (Time, Place and Person)  Thought Content  History of Schizophrenia/Schizoaffective disorder:No  Duration of Psychotic Symptoms:Greater than six months  Hallucinations:Hallucinations: None Description of Auditory Hallucinations: girls moaning  Ideas of Reference:None  Suicidal Thoughts:Suicidal Thoughts: No  Homicidal Thoughts:Homicidal Thoughts: No   Sensorium  Memory:Immediate Fair; Recent Fair; Remote Fair  Judgment:Fair  Insight:Fair   Executive Functions  Concentration:Fair  Attention Span:Good  Recall:Fair  Fund of Knowledge:Fair  Language:Fair   Psychomotor Activity  Psychomotor Activity:Psychomotor Activity: Normal   Assets  Assets:Desire for Improvement; Social Support   Sleep  Sleep:Sleep: Fair    Physical Exam: Physical Exam Vitals and nursing note reviewed.  HENT:     Mouth/Throat:     Mouth: Mucous membranes are moist.  Cardiovascular:     Rate and Rhythm: Normal rate and regular rhythm.     Pulses: Normal pulses.  Neurological:     Mental Status: He is oriented to person, place, and time.  Psychiatric:        Mood and Affect: Mood normal.        Behavior: Behavior normal.        Thought Content: Thought content normal.   Review of Systems  Cardiovascular: Negative.  Gastrointestinal: Negative.   Neurological: Negative.   Endo/Heme/Allergies: Negative.   Psychiatric/Behavioral: Negative.  Negative for depression and suicidal ideas. The patient is not nervous/anxious.   All other systems reviewed and are negative. Blood pressure 121/80, pulse 96, temperature 98.3 F (36.8 C), temperature source Oral, resp. rate 18, height 6\' 1"   (1.854 m), weight 70.8 kg, SpO2 98 %. Body mass index is 20.58 kg/m.   Social History   Tobacco Use  Smoking Status Never  Smokeless Tobacco Never   Tobacco Cessation:  N/A, patient does not currently use tobacco products   Blood Alcohol level:  Lab Results  Component Value Date   ETH <10 05/30/2021   ETH <10 02/02/2021    Metabolic Disorder Labs:  Lab Results  Component Value Date   HGBA1C 5.9 (H) 06/02/2021   MPG 122.63 06/02/2021   MPG 126 02/03/2021   Lab Results  Component Value Date   PROLACTIN 17.5 (H) 06/02/2021   PROLACTIN 23.4 (H) 11/28/2018   Lab Results  Component Value Date   CHOL 137 06/02/2021   TRIG 105 06/02/2021   HDL 36 (L) 06/02/2021   CHOLHDL 3.8 06/02/2021   VLDL 21 06/02/2021   LDLCALC 80 06/02/2021   LDLCALC 80 01/30/2021    See Psychiatric Specialty Exam and Suicide Risk Assessment completed by Attending Physician prior to discharge.  Discharge destination:  Home  Is patient on multiple antipsychotic therapies at discharge:  No   Has Patient had three or more failed trials of antipsychotic monotherapy by history:  No  Recommended Plan for Multiple Antipsychotic Therapies: NA   Allergies as of 06/10/2021   No Known Allergies      Medication List     STOP taking these medications    FLUoxetine 20 MG capsule Commonly known as: PROZAC   haloperidol decanoate 100 MG/ML injection Commonly known as: HALDOL DECANOATE   risperiDONE 3 MG tablet Commonly known as: RISPERDAL       TAKE these medications      Indication  benztropine 1 MG tablet Commonly known as: COGENTIN Take 1 tablet (1 mg total) by mouth 2 (two) times daily as needed for tremors (Muscle stiffness and pain (EPS)). What changed: Another medication with the same name was added. Make sure you understand how and when to take each.  Indication: Extrapyramidal Reaction caused by Medications   benztropine 1 MG tablet Commonly known as: COGENTIN Take 1 tablet  (1 mg total) by mouth at bedtime. What changed: You were already taking a medication with the same name, and this prescription was added. Make sure you understand how and when to take each.  Indication: Extrapyramidal Reaction caused by Medications   melatonin 3 MG Tabs tablet Take 1 tablet (3 mg total) by mouth at bedtime.  Indication: Trouble Sleeping   OLANZapine 7.5 MG tablet Commonly known as: ZYPREXA Take 1 tablet (7.5 mg total) by mouth at bedtime. What changed:  medication strength how much to take  Indication: Depressive Phase of Manic-Depression, psychosis        Follow-up Information     Services, [facility]. Go on 06/13/2021.   Why: You have a hospital follow up appointment for therapy and medication management services on 06/13/21 at 9:30 am.   This appointment will be held in person.  Please inquire about ACTT team services with this provider. Contact information: [address] [place] [phone]  Follow-up recommendations:  Activity:  as tolerated Diet:  heart healthy   Comments:  Take all medications as prescribed. Keep all follow-up appointments as scheduled.  Do not consume alcohol or use illegal drugs while on prescription medications. Report any adverse effects from your medications to your primary care provider promptly.  In the event of recurrent symptoms or worsening symptoms, call 911, a crisis hotline, or go to the nearest emergency department for evaluation.    Signed: Oneta Rack, NP 06/10/2021, 10:28 AM

## 2021-06-10 NOTE — Progress Notes (Signed)
Pt discharged to lobby. Pt was stable and appreciative at that time. All papers, samples and prescriptions were given and valuables returned. Verbal understanding expressed. Denies SI/HI and A/VH. Pt given opportunity to express concerns and ask questions.  

## 2021-06-10 NOTE — BHH Suicide Risk Assessment (Cosign Needed Addendum)
Suicide Risk Assessment  Discharge Assessment    [facility] Discharge Suicide Risk Assessment   Principal Problem: Schizophrenia [facility]) Discharge Diagnoses: Principal Problem:   Schizophrenia (HCC) Active Problems:   Anxiety state   Insomnia   Total Time spent with patient: 15 minutes  Per admission assessment note: Navjot Loera is a 36 y.o [place] male with a history of Schizophrenia who presented to the [facility] ER with complaints of worsening auditory hallucinations, passive SI & depressive symptoms in the context of medication non compliance and homelessness. Pt was transferred voluntarily to this [facility] for treatment and stabilization of his mood,   Evaluation at Discharge: Sorren was seen and evaluated by this practitioner and attending psychiatrist MD Lucianne Muss.  Patient observed sitting in dayroom interacting with staff.  denying suicidal or homicidal ideations.  Denies auditory visual hallucinations.  Reports he is medication compliant denying any medication side effects.  Patient reports he has plans to reside with his mother and [facility] [place].  We will make medication refills available additionally patient will be provided with 7-day samples.  He denied any other concerns prior to discharge.  CSW to follow-up with patient's mother for discharge planning.  Support,encouragement and  reassurance was provided.   Musculoskeletal: Strength & Muscle Tone: within normal limits Gait & Station: normal Patient leans: N/A  Psychiatric Specialty Exam  Presentation  General Appearance: Appropriate for Environment  Eye Contact:Good  Speech:Clear and Coherent  Speech Volume:Normal  Handedness:Right   Mood and Affect  Mood:Anxious  Duration of Depression Symptoms: Greater than two weeks  Affect:Congruent   Thought Process  Thought Processes:Coherent  Descriptions of Associations:Intact  Orientation:Full (Time, Place and Person)  Thought  Content:Logical  History of Schizophrenia/Schizoaffective disorder:No  Duration of Psychotic Symptoms:Greater than six months  Hallucinations:Hallucinations: None Description of Auditory Hallucinations: girls moaning  Ideas of Reference:None  Suicidal Thoughts:Suicidal Thoughts: No  Homicidal Thoughts:Homicidal Thoughts: No   Sensorium  Memory:Immediate Fair; Recent Fair; Remote Fair  Judgment:Fair  Insight:Fair   Executive Functions  Concentration:Fair  Attention Span:Good  Recall:Fair  Fund of Knowledge:Fair  Language:Fair   Psychomotor Activity  Psychomotor Activity:Psychomotor Activity: Normal   Assets  Assets:Desire for Improvement; Social Support   Sleep  Sleep:Sleep: Fair   Physical Exam: Physical Exam Vitals reviewed.  Pulmonary:     Effort: Pulmonary effort is normal.  Skin:    General: Skin is warm and dry.  Neurological:     Mental Status: He is oriented to person, place, and time.  Psychiatric:        Mood and Affect: Mood normal.        Behavior: Behavior normal.        Thought Content: Thought content normal.   Review of Systems  Eyes: Negative.   Cardiovascular: Negative.   Genitourinary: Negative.   Psychiatric/Behavioral:  Negative for depression and suicidal ideas. The patient is nervous/anxious.   All other systems reviewed and are negative. Blood pressure 121/80, pulse 96, temperature 98.3 F (36.8 C), temperature source Oral, resp. rate 18, height 6\' 1"  (1.854 m), weight 70.8 kg, SpO2 98 %. Body mass index is 20.58 kg/m.  Mental Status Per Nursing Assessment::   On Admission:  NA  Demographic Factors:  Male and Low socioeconomic status  Loss Factors: Financial problems/change in socioeconomic status  Historical Factors: Impulsivity  Risk Reduction Factors:   Positive social support and Positive therapeutic relationship  Continued Clinical Symptoms:  Bipolar Disorder:   Bipolar II Schizophrenia:   Command  hallucinatons  Cognitive Features That Contribute To Risk:  Closed-mindedness    Suicide Risk:  Minimal: No identifiable suicidal ideation.  Patients presenting with no risk factors but with morbid ruminations; may be classified as minimal risk based on the severity of the depressive symptoms   Follow-up Information     Services, [facility]. Go on 06/13/2021.   Why: You have a hospital follow up appointment for therapy and medication management services on 06/13/21 at 9:30 am.   This appointment will be held in person.  Please inquire about ACTT team services with this provider. Contact information: [address] [place] [address] [phone]                 Plan Of Care/Follow-up recommendations:  Activity:  as tolerated  Diet:  heart healthy   Take all medications as prescribed. Keep all follow-up appointments as scheduled.  Do not consume alcohol or use illegal drugs while on prescription medications. Report any adverse effects from your medications to your primary care provider promptly.  In the event of recurrent symptoms or worsening symptoms, call 911, a crisis hotline, or go to the nearest emergency department for evaluation.    Oneta Rack, NP 06/10/2021, 10:32 AM

## 2021-06-10 NOTE — BHH Suicide Risk Assessment (Signed)
BHH INPATIENT:  Family/Significant Other Suicide Prevention Education  Suicide Prevention Education:  Education Completed; Wesley Peterson, Wesley Peterson has been identified by the patient as the family member/significant other with whom the patient will be residing, and identified as the person(s) who will aid the patient in the event of a mental health crisis (suicidal ideations/suicide attempt).  With written consent from the patient, the family member/significant other has been provided the following suicide prevention education, prior to the and/or following the discharge of the patient.  In addition to the precautions below, patient's support agrees to monitor patient for safety, ensure treatment compliance, and alert emergency services should patient require such services. CSW confirmed with Wesley Peterson that patient can return to her home where she will provide supervision.   The suicide prevention education provided includes the following: Suicide risk factors Suicide prevention and interventions National Suicide Hotline telephone number [facility] assessment telephone number [facility] Emergency Assistance 911 [facility] and/or Residential Mobile Crisis Unit telephone number  Request made of family/significant other to: Remove weapons (e.g., guns, rifles, knives), all items previously/currently identified as safety concern.   Remove drugs/medications (over-the-counter, prescriptions, illicit drugs), all items previously/currently identified as a safety concern.  The family member/significant other verbalizes understanding of the suicide prevention education information provided.  The family member/significant other agrees to remove the items of safety concern listed above.  Wesley Peterson 06/10/2021, 9:41 AM

## 2021-06-10 NOTE — Group Note (Deleted)
LCSW Group Therapy Note   Group Date: 06/10/2021 Start Time: 1300 End Time: 1400   Type of Therapy and Topic:  Group Therapy:   Participation Level:  {BHH PARTICIPATION HAFBX:03833}  Description of Group:   Therapeutic Goals:  1.     Summary of Patient Progress:    ***  Therapeutic Modalities:   Beatris Si, LCSWA 06/10/2021  11:40 AM

## 2021-06-10 NOTE — Progress Notes (Signed)
  [facility] Adult Case Management Discharge Plan :  Will you be returning to the same living situation after discharge:  No.  CSW confirmed with mother that patient can return to her home where she will provide supervision.  At discharge, do you have transportation home?: Yes,  CSW to provide patient with cab voucher for transportation from hospital.  Do you have the ability to pay for your medications: No. Patient has no listed insurance, to be provided with 7 day supply of medication at discharge. CSW has provided patient with clinic information for free/reduced medications.   Release of information consent forms completed and in the chart;  Patient's signature needed at discharge.  Patient to Follow up at:  Follow-up Information     [facility], [facility]. Go on 06/13/2021.   Why: You have a hospital follow up appointment for therapy and medication management services on 06/13/21 at 9:30 am.   This appointment will be held in person.  Please inquire about ACTT team services with this provider. Contact information: [place] [address] [phone]                 Next level of care provider has access to [place] and Suicide Prevention discussed: Yes,  SPE completed with patient and Katharina Caper, mother.   Patient's support agrees to monitor patient for safety, ensure treatment compliance, and alert emergency services should patient require such services.   Has patient been referred to the Quitline?: N/A patient is not a smoker Tobacco Use: Low Risk    Smoking Tobacco Use: Never   Smokeless Tobacco Use: Never   Passive Exposure: Not on file    Patient has been referred for addiction treatment: N/A Patient denies active substance use, UDS Negative for all, screened low risk during nursing admission (see SDH quick tab).  Alcohol Level    Component Value Date/Time   ETH <10 05/30/2021 1443   Social History   Substance and Sexual  Activity  Alcohol Use Not Currently   Alcohol/week: 1.0 standard drink   Types: 1 Cans of beer per week   Comment: occasional   Social History   Substance and Sexual Activity  Drug Use Never    Durenda Hurt, Latanya Presser 06/10/2021, 9:42 AM

## 2021-06-10 NOTE — Group Note (Signed)
Recreation Therapy Group Note   Group Topic:Team Building  Group Date: 06/10/2021 Start Time: 1000 End Time: 1040 Facilitators: Caroll Rancher, LRT,CTRS Location: [address] Dayroom   Goal Area(s) Addresses:  Patient will effectively work with peer towards shared goal.  Patient will identify skills used to make activity successful.  Patient will share challenges and verbalize solution-driven approaches used. Patient will identify how skills used during activity can be used to reach post d/c goals.   Group Description:  [employer]. Patients were provided the following materials: 2 drinking straws, 5 rubber bands, 5 paper clips, 2 index cards and 2 drinking cups. Using the provided materials patients were asked to build a launching mechanism to launch a ping pong ball across the room, approximately 10 feet. Patients were divided into teams of 3-5. Instructions required all materials be incorporated into the device, functionality of items left to the peer group's discretion.   Affect/Mood: Appropriate   Participation Level: Active   Participation Quality: Independent   Behavior: Appropriate and Attentive    Speech/Thought Process: Focused   Insight: Moderate   Judgement: Moderate   Modes of Intervention: STEM Activity   Patient Response to Interventions:  Engaged   Education Outcome:  Acknowledges education and In group clarification offered    Clinical Observations/Individualized Feedback: Pt attempted to complete activity on his own due to being the only pt in group.  Pt looked at and tried different things before stating he didn't know how to complete the activity.  LRT helped pt construct a launcher that would eventually launch the ping pong ball.  Pt was appropriate, quiet but pleasant.    Plan: Continue to engage patient in RT group sessions 2-3x/week.   Caroll Rancher, LRT,CTRS 06/10/2021 11:44 AM

## 2021-06-10 NOTE — Progress Notes (Signed)
   06/10/21 1000  Psych Admission Type (Psych Patients Only)  Admission Status Voluntary  Psychosocial Assessment  Patient Complaints None  Eye Contact Brief  Facial Expression Flat  Affect Depressed  Speech Logical/coherent;Soft  Interaction Cautious  Motor Activity Slow  Appearance/Hygiene Unremarkable  Behavior Characteristics Cooperative  Mood Pleasant;Depressed  Thought Process  Coherency WDL  Content WDL  Delusions Paranoid  Perception Hallucinations  Hallucination Auditory  Judgment Impaired  Confusion None  Danger to Self  Current suicidal ideation? Denies  Danger to Others  Danger to Others None reported or observed

## 2021-06-10 NOTE — [facility] Group Notes (Signed)
Adult Orientation Group Note  Date:  06/10/2021 Time:  9:13 AM  Group Topic/Focus:  Orientation:   The focus of this group is to educate the patient on the purpose and policies of crisis stabilization and provide a format to answer questions about their admission.  The group details unit policies and expectations of patients while admitted.  Participation Level:  Active  Participation Quality:  Appropriate  Affect:  Appropriate  Cognitive:  Appropriate  Insight: Appropriate  Engagement in Group:  Engaged  Modes of Intervention:  Education  Kern Reap 06/10/2021, 9:13 AM

## 2021-07-21 ENCOUNTER — Other Ambulatory Visit: Payer: Self-pay

## 2021-07-21 ENCOUNTER — Encounter (HOSPITAL_COMMUNITY): Payer: Self-pay

## 2021-07-21 ENCOUNTER — Emergency Department (HOSPITAL_COMMUNITY)
Admission: EM | Admit: 2021-07-21 | Discharge: 2021-07-22 | Disposition: A | Discharge status: Home / Self Care | Payer: Medicaid Other | Attending: Emergency Medicine | Admitting: Emergency Medicine

## 2021-07-21 DIAGNOSIS — F209 Schizophrenia, unspecified: Secondary | ICD-10-CM | POA: Insufficient documentation

## 2021-07-21 DIAGNOSIS — R440 Auditory hallucinations: Secondary | ICD-10-CM | POA: Diagnosis not present

## 2021-07-21 LAB — CBC WITH DIFFERENTIAL/PLATELET
Abs Immature Granulocytes: 0.01 10*3/uL (ref 0.00–0.07)
Basophils Absolute: 0 10*3/uL (ref 0.0–0.1)
Basophils Relative: 1 %
Eosinophils Absolute: 0.2 10*3/uL (ref 0.0–0.5)
Eosinophils Relative: 4 %
HCT: 38.8 % — ABNORMAL LOW (ref 39.0–52.0)
Hemoglobin: 12.3 g/dL — ABNORMAL LOW (ref 13.0–17.0)
Immature Granulocytes: 0 %
Lymphocytes Relative: 23 %
Lymphs Abs: 0.9 10*3/uL (ref 0.7–4.0)
MCH: 30.8 pg (ref 26.0–34.0)
MCHC: 31.7 g/dL (ref 30.0–36.0)
MCV: 97 fL (ref 80.0–100.0)
Monocytes Absolute: 0.4 10*3/uL (ref 0.1–1.0)
Monocytes Relative: 9 %
Neutro Abs: 2.5 10*3/uL (ref 1.7–7.7)
Neutrophils Relative %: 63 %
Platelets: 264 10*3/uL (ref 150–400)
RBC: 4 MIL/uL — ABNORMAL LOW (ref 4.22–5.81)
RDW: 14.5 % (ref 11.5–15.5)
WBC: 4.1 10*3/uL (ref 4.0–10.5)
nRBC: 0 % (ref 0.0–0.2)

## 2021-07-21 LAB — BASIC METABOLIC PANEL
Anion gap: 5 (ref 5–15)
BUN: 14 mg/dL (ref 6–20)
CO2: 26 mmol/L (ref 22–32)
Calcium: 8.8 mg/dL — ABNORMAL LOW (ref 8.9–10.3)
Chloride: 105 mmol/L (ref 98–111)
Creatinine, Ser: 0.99 mg/dL (ref 0.61–1.24)
GFR, Estimated: >60 mL/min (ref >60)
Glucose, Bld: 90 mg/dL (ref 70–99)
Potassium: 4.3 mmol/L (ref 3.5–5.1)
Sodium: 136 mmol/L (ref 135–145)

## 2021-07-21 LAB — RAPID URINE DRUG SCREEN, HOSP PERFORMED
Amphetamines: NOT DETECTED
Barbiturates: NOT DETECTED
Benzodiazepines: NOT DETECTED
Cocaine: NOT DETECTED
Opiates: NOT DETECTED
Tetrahydrocannabinol: NOT DETECTED

## 2021-07-21 LAB — ETHANOL: Alcohol, Ethyl (B): <10 mg/dL (ref <10)

## 2021-07-21 MED ORDER — OLANZAPINE 5 MG PO TABS
7.5000 mg | ORAL_TABLET | Freq: Every day | ORAL | Status: DC
  Administered 2021-07-21: 7.5 mg via ORAL
  Filled 2021-07-21: qty 2

## 2021-07-21 MED ORDER — RISPERIDONE 1 MG PO TABS
3.0000 mg | ORAL_TABLET | Freq: Every day | ORAL | Status: DC
  Administered 2021-07-21: 3 mg via ORAL
  Filled 2021-07-21: qty 3

## 2021-07-21 MED ORDER — BENZTROPINE MESYLATE 1 MG PO TABS
1.0000 mg | ORAL_TABLET | Freq: Every day | ORAL | Status: DC
  Administered 2021-07-22: 1 mg via ORAL
  Filled 2021-07-21: qty 1

## 2021-07-21 MED ORDER — AMPHETAMINE-DEXTROAMPHET ER 5 MG PO CP24: 5.0000 mg | ORAL_CAPSULE | Freq: Every day | ORAL | Status: DC

## 2021-07-21 MED ORDER — LORAZEPAM 1 MG PO TABS
1.0000 mg | ORAL_TABLET | Freq: Once | ORAL | Status: AC
  Administered 2021-07-21: 1 mg via ORAL
  Filled 2021-07-21: qty 1

## 2021-07-21 NOTE — ED Notes (Signed)
Pt wanded by security prior to changing into psych scrubs. Pt knife and vape given to security to lock up.

## 2021-07-21 NOTE — ED Notes (Signed)
Security wanded pt after changing into burgundy scrubs.  Pt's belongings bagged, tagged and locked up in the locker within the dept.

## 2021-07-21 NOTE — ED Notes (Signed)
Lab reported that pt refused to have his blood work drawn

## 2021-07-21 NOTE — ED Triage Notes (Signed)
Pt brought to ED by dad, sent from [facility]. Pt states he honestly doesn't know how he is feeling. Pt states [place] said something to him and he got really angry and mad. Pt states " I have to be somewhere today, I am hearing these voices and it is not mental or psychological." Pt states the voices are saying "ours they rob me of all my shit and it ain't even theirs"

## 2021-07-21 NOTE — ED Provider Notes (Signed)
[facility] EMERGENCY DEPARTMENT Provider Note   CSN: [id number] Arrival date & time: 07/21/21  1211     History  Chief Complaint  Patient presents with   V70.1    Wesley Peterson is a 36 y.o. male with history of schizophrenia, schizoaffective disorder previous suicidal ideation who presents to the emergency department for auditory hallucinations.  Patient was at [facility] and brought here by his father, although he is not exactly sure why he is here.  He states that he has been hearing voices, except that these voices are "actually real".  He states that they are moaning or otherwise intelligible voices.  Insist that the voices are not resultant of internal stimuli.  He is also having some paranoid delusion as he states "they are robbing me of all my staff and it a name and there is no".  Patient states that he is not in any pain and he denies chest pain, shortness of breath, abdominal pain, nausea, vomiting, diarrhea and fever.  He denies suicidal or homicidal ideation  HPI     Home Medications Prior to Admission medications   Medication Sig Start Date End Date Taking? Authorizing Provider  amphetamine-dextroamphetamine (ADDERALL) 5 MG tablet Take 1 tablet by mouth 2 (two) times daily. 06/23/21  Yes [provider]  benztropine (COGENTIN) 1 MG tablet Take 1 tablet (1 mg total) by mouth at bedtime. 06/10/21  Yes Oneta Rack, NP  melatonin 3 MG TABS tablet Take 1 tablet (3 mg total) by mouth at bedtime. 02/21/21  Yes [name], DO  OLANZapine (ZYPREXA) 7.5 MG tablet Take 1 tablet (7.5 mg total) by mouth at bedtime. 06/10/21  Yes Oneta Rack, NP  risperiDONE (RISPERDAL) 3 MG tablet Take 3 mg by mouth 2 (two) times daily. 07/14/21  Yes [provider]  benztropine (COGENTIN) 1 MG tablet Take 1 tablet (1 mg total) by mouth 2 (two) times daily as needed for tremors (Muscle stiffness and pain (EPS)). 02/21/21 03/23/21  [name], DO      Allergies    Patient has no  known allergies.    Review of Systems   Review of Systems  Cardiovascular:  Negative for chest pain.  Gastrointestinal:  Negative for abdominal pain.  Psychiatric/Behavioral:  Positive for hallucinations. Negative for self-injury and suicidal ideas.     Physical Exam Updated Vital Signs BP 117/69 (BP Location: Left Arm)   Pulse (!) 113   Temp 98.5 F (36.9 C) (Oral)   Resp 18   Ht 6\' 1"  (1.854 m)   Wt 70.2 kg   SpO2 100%   BMI 20.41 kg/m  Physical Exam Vitals and nursing note reviewed.  Constitutional:      General: He is not in acute distress.    Appearance: He is not ill-appearing.     Comments: Calm and in no acute distress  HENT:     Head: Atraumatic.  Eyes:     Conjunctiva/sclera: Conjunctivae normal.  Cardiovascular:     Rate and Rhythm: Normal rate and regular rhythm.     Pulses: Normal pulses.     Heart sounds: No murmur heard. Pulmonary:     Effort: Pulmonary effort is normal. No respiratory distress.     Breath sounds: Normal breath sounds.  Abdominal:     General: Abdomen is flat. There is no distension.     Palpations: Abdomen is soft.     Tenderness: There is no abdominal tenderness.  Musculoskeletal:  General: Normal range of motion.     Cervical back: Normal range of motion.  Skin:    General: Skin is warm and dry.     Capillary Refill: Capillary refill takes less than 2 seconds.  Neurological:     General: No focal deficit present.     Mental Status: He is alert.  Psychiatric:        Attention and Perception: He perceives auditory hallucinations. He does not perceive visual hallucinations.        Mood and Affect: Affect is flat.        Speech: Speech normal.        Behavior: Behavior is cooperative.        Thought Content: Thought content is paranoid. Thought content does not include homicidal or suicidal ideation.     ED Results / Procedures / Treatments   Labs (all labs ordered are listed, but only abnormal results are  displayed) Labs Reviewed  CBC WITH DIFFERENTIAL/PLATELET - Abnormal; Notable for the following components:      Result Value   RBC 4.00 (*)    Hemoglobin 12.3 (*)    HCT 38.8 (*)    All other components within normal limits  BASIC METABOLIC PANEL - Abnormal; Notable for the following components:   Calcium 8.8 (*)    All other components within normal limits  RESP PANEL BY RT-PCR (FLU A&B, COVID) ARPGX2  RAPID URINE DRUG SCREEN, HOSP PERFORMED  ETHANOL    EKG EKG Interpretation  Date/Time:  Thursday July 21 2021 14:53:25 EDT Ventricular Rate:  69 PR Interval:  140 QRS Duration: 100 QT Interval:  396 QTC Calculation: 424 R Axis:   55 Text Interpretation: Normal sinus rhythm with sinus arrhythmia ST elevation, consider early repolarization, pericarditis, or injury Abnormal ECG When compared with ECG of 08-Jun-2021 18:23, No significant change was found Confirmed by Aletta Edouard [phone]) on 07/21/2021 2:58:40 PM  Radiology No results found.  Procedures Procedures    Medications Ordered in ED Medications  amphetamine-dextroamphetamine (ADDERALL XR) 24 hr capsule 5 mg (has no administration in time range)  benztropine (COGENTIN) tablet 1 mg (has no administration in time range)  OLANZapine (ZYPREXA) tablet 7.5 mg (has no administration in time range)  risperiDONE (RISPERDAL) tablet 3 mg (has no administration in time range)  LORazepam (ATIVAN) tablet 1 mg (1 mg Oral Given 07/21/21 2147)    ED Course/ Medical Decision Making/ A&P                           Medical Decision Making Amount and/or Complexity of Data Reviewed Labs: ordered.  Risk Prescription drug management.   Social determinants of health:  Social History   Socioeconomic History   Marital status: Single    Spouse name: Not on file   Number of children: 1   Years of education: Not on file   Highest education level: Not on file  Occupational History   Occupation: Unemployed  Tobacco Use   Smoking  status: Never   Smokeless tobacco: Never  Vaping Use   Vaping Use: Never used  Substance and Sexual Activity   Alcohol use: Not Currently    Alcohol/week: 1.0 standard drink of alcohol    Types: 1 Cans of beer per week    Comment: occasional   Drug use: Never   Sexual activity: Never  Other Topics Concern   Not on file  Social History Narrative   ** Merged History Encounter **  Pt lives with mother and other relatives.  Currently unemployed.  Receives outpatient psychiatry services through [facility].   Social Determinants of Health   Financial Resource Strain: Not on file  Food Insecurity: Not on file  Transportation Needs: Not on file  Physical Activity: Not on file  Stress: Not on file  Social Connections: Not on file  Intimate Partner Violence: Not on file     Initial impression:  This patient presents to the ED for concern of auditory hallucinations, this involves an extensive number of treatment options, and is a complaint that carries with it a high risk of complications and morbidity.     Comorbidities affecting care:  Schizophrenia, agitation, suicidal ideation  Additional history obtained: RN note  Lab Tests  I Ordered, reviewed, and interpreted labs and EKG.  The pertinent results include:  BMP, CBC, UDS and ethanol normal  EKG: Normal sinus rhythm   Medicines ordered and prescription drug management:  I ordered medication including: Ativan 1 mg for agitation Ordered home meds as reported in chart I have reviewed the patients home medicines and have made adjustments as needed  Consultations Obtained:  I requested consultation with TTS,  and discussed lab and imaging findings as well as pertinent plan - they recommend: Continuous assessment and AP ED and psychiatry will reevaluate in the morning    Disposition:  After consideration of the diagnostic results, physical exam, history and the patients response to treatment feel that the patent  would benefit from psych hold with monitoring and psych reassessment in the morning.  Care transferred to default provider Auditory hallucinations: Plan and management as described above.    Final diagnoses:  Auditory hallucination    Rx / DC Orders ED Discharge Orders     None         Delight Ovens 07/21/21 2218    Terrilee Files, MD 07/22/21 [phone]

## 2021-07-21 NOTE — ED Notes (Signed)
Pt received a bag lunch
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
## 2021-07-22 DIAGNOSIS — F209 Schizophrenia, unspecified: Secondary | ICD-10-CM

## 2021-07-22 DIAGNOSIS — R440 Auditory hallucinations: Secondary | ICD-10-CM

## 2021-07-22 MED ORDER — BENZTROPINE MESYLATE 1 MG PO TABS: 1.0000 mg | ORAL_TABLET | Freq: Every evening | ORAL | 0 refills | Status: DC

## 2021-07-22 MED ORDER — RISPERIDONE 3 MG PO TABS: 3.0000 mg | ORAL_TABLET | Freq: Two times a day (BID) | ORAL | 0 refills | Status: DC

## 2021-07-22 MED ORDER — MELATONIN 3 MG PO TABS: 3.0000 mg | ORAL_TABLET | Freq: Every day | ORAL | 0 refills | Status: AC

## 2021-07-22 MED ORDER — OLANZAPINE 7.5 MG PO TABS: 7.5000 mg | ORAL_TABLET | Freq: Every day | ORAL | 0 refills | Status: DC

## 2021-07-22 MED ORDER — MELATONIN 3 MG PO TABS: 3.0000 mg | ORAL_TABLET | Freq: Every day | ORAL | 0 refills | Status: DC

## 2021-07-22 NOTE — ED Notes (Signed)
Pt given breakfast tray

## 2021-07-22 NOTE — Progress Notes (Signed)
Pt is psych cleared per [employer]. CSW added resources. Pt will now be removed from the [facility] shift report. TOC to assist with discharges needs.    Maryjean Ka, MSW, LCSWA 07/22/2021 1:53 PM

## 2021-07-22 NOTE — ED Notes (Signed)
Pt has been discharged. Pt asked RN to call mother to pick him up. Mother did not answer. Father answered his phone, but is unable to pick up the pt due to work schedule. Father stated he would call the mother and see if she can pick up the patient. RN will await returned call.

## 2021-07-22 NOTE — ED Notes (Signed)
Father returned call and reported pts mother would be at [facility] to pick up son in 30-45 mins. RN made father aware son would be waiting in the lobby of the [facility]. Son was made aware mother would be her in 30-45 mins . Son is waiting in lobby.

## 2021-07-22 NOTE — ED Notes (Signed)
TTS in progress. Pharmacy called about adderall

## 2021-07-22 NOTE — Consult Note (Signed)
Telepsych Consultation   Reason for Consult:  Psychiatric Reassessment Referring Physician:  Delight Ovens Location of Patient:    [facility] Location of Provider: Other: virtual home office  Patient Identification: Wesley Peterson MRN:  [id number] Principal Diagnosis: Schizophrenia [facility]) Diagnosis:  Principal Problem:   Schizophrenia (HCC)   Total Time spent with patient: 30 minutes  Subjective:   Wesley Peterson is a 36 y.o. male patient with hx of schizophrenia admitted for psychiatric evaluation of hallucinations, in the setting medication non-compliance.  His admission UDS was negative.   HPI:   Patient seen via telepsych by this provider; chart reviewed and consulted with Dr. Lucianne Muss on 07/22/21.  On evaluation Wesley Peterson is laying on the hospital gurney with his eyes closed.  He makes eye contact with this Clinical research associate when asked to do so.  Patient is A&Ox3, and  appropriately interactive today.  States he no longer hears voices  denies visual hallucinations.  Patient is speaks in a low tone but does not appear acutely psychotic and does not appear to be responding to internal stimulus.    He endorses most of what has already been captured in admission assessment.  States he's been off meds for a few days.  He reports going to [facility] "whenever I need meds" but in the setting of his homelessness, no transportation and medication non-compliance not sure often he follows up.   He does not have an ACT teams,stated he's never had one; Reports he lives on the streets, anywhere he can. He used to live in a shelter but no longer stays there because they closed down.  States he eats 3x daily gets money through his access card.  States he would like to be linked with homeless resources today.   Per record review, patient's family wants him linked to a group home/AFL which may be an appropriate long-term need but unfortunately, he cannot remain in the emergency department while awaiting  acceptance.     Per ED provider Admissions Assessment 07/21/2021: Chief Complaint  Patient presents with   V70.1      Wesley Peterson is a 36 y.o. male with history of schizophrenia, schizoaffective disorder previous suicidal ideation who presents to the emergency department for auditory hallucinations.  Patient was at [facility] and brought here by his father, although he is not exactly sure why he is here.  He states that he has been hearing voices, except that these voices are "actually real".  He states that they are moaning or otherwise intelligible voices.  Insist that the voices are not resultant of internal stimuli.  He is also having some paranoid delusion as he states "they are robbing me of all my staff and it a name and there is no".  Patient states that he is not in any pain and he denies chest pain, shortness of breath, abdominal pain, nausea, vomiting, diarrhea and fever.  He denies suicidal or homicidal ideation  Past Psychiatric History: as outlined above  Risk to Self:  no Risk to Others:  no Prior Inpatient Therapy:  unknown Prior Outpatient Therapy:  yes, he's established with [facility]  Past Medical History:  Past Medical History:  Diagnosis Date   Schizophrenia (HCC)    Schizophrenia, paranoid type (HCC) 02/02/2021   History reviewed. No pertinent surgical history. Family History: No family history on file. Family Psychiatric  History: unknown Social History:  Social History   Substance and Sexual Activity  Alcohol Use Not Currently   Alcohol/week: 1.0 standard  drink of alcohol   Types: 1 Cans of beer per week   Comment: occasional     Social History   Substance and Sexual Activity  Drug Use Never    Social History   Socioeconomic History   Marital status: Single    Spouse name: Not on file   Number of children: 1   Years of education: Not on file   Highest education level: Not on file  Occupational History   Occupation: Unemployed  Tobacco Use   Smoking  status: Never   Smokeless tobacco: Never  Vaping Use   Vaping Use: Never used  Substance and Sexual Activity   Alcohol use: Not Currently    Alcohol/week: 1.0 standard drink of alcohol    Types: 1 Cans of beer per week    Comment: occasional   Drug use: Never   Sexual activity: Never  Other Topics Concern   Not on file  Social History Narrative   ** Merged History Encounter **       Pt lives with mother and other relatives.  Currently unemployed.  Rece[facility].   Social Determinants of Health   Financial Resource Strain: Not on file  Food Insecurity: Not on file  Transportation Needs: Not on file  Physical Activity: Not on file  Stress: Not on file  Social Connections: Not on file   Additional Social History:    Allergies:  No Known Allergies  Labs:  Results for orders placed or performed during the hospital encounter of 07/21/21 (from the past 48 hour(s))  CBC with Differential     Status: Abnormal   Collection Time: 07/21/21  2:14 PM  Result Value Ref Range   WBC 4.1 4.0 - 10.5 K/uL   RBC 4.00 (L) 4.22 - 5.81 MIL/uL   Hemoglobin 12.3 (L) 13.0 - 17.0 g/dL   HCT 82.938.8 (L) 56.239.0 - 13.052.0 %   MCV 97.0 80.0 - 100.0 fL   MCH 30.8 26.0 - 34.0 pg   MCHC 31.7 30.0 - 36.0 g/dL   RDW 86.514.5 78.411.5 - 69.615.5 %   Platelets 264 150 - 400 K/uL   nRBC 0.0 0.0 - 0.2 %   Neutrophils Relative % 63 %   Neutro Abs 2.5 1.7 - 7.7 K/uL   Lymphocytes Relative 23 %   Lymphs Abs 0.9 0.7 - 4.0 K/uL   Monocytes Relative 9 %   Monocytes Absolute 0.4 0.1 - 1.0 K/uL   Eosinophils Relative 4 %   Eosinophils Absolute 0.2 0.0 - 0.5 K/uL   Basophils Relative 1 %   Basophils Absolute 0.0 0.0 - 0.1 K/uL   Immature Granulocytes 0 %   Abs Immature Granulocytes 0.01 0.00 - 0.07 K/uL    Comment: Performed at [facility], [address] [place], [place] [address]  Basic metabolic panel     Status: Abnormal   Collection Time: 07/21/21  2:14 PM  Result Value Ref  Range   Sodium 136 135 - 145 mmol/L   Potassium 4.3 3.5 - 5.1 mmol/L   Chloride 105 98 - 111 mmol/L   CO2 26 22 - 32 mmol/L   Glucose, Bld 90 70 - 99 mg/dL    Comment: Glucose reference range applies only to samples taken after fasting for at least 8 hours.   BUN 14 6 - 20 mg/dL   Creatinine, Ser 4.130.99 0.61 - 1.24 mg/dL   Calcium 8.8 (L) 8.9 - 10.3 mg/dL   GFR, Estimated >24>60 >40>60 mL/min  Comment: (NOTE) Calculated using the CKD-EPI Creatinine Equation (2021)    Anion gap 5 5 - 15    Comment: Performed at [facility], [address], [place], [place] [address]  Ethanol     Status: None   Collection Time: 07/21/21  2:14 PM  Result Value Ref Range   Alcohol, Ethyl (B) <10 <10 mg/dL    Comment: (NOTE) Lowest detectable limit for serum alcohol is 10 mg/dL.  For medical purposes only. Performed at [facility], [address], [place], [place] [address]   Rapid urine drug screen (hospital performed)     Status: None   Collection Time: 07/21/21  7:28 PM  Result Value Ref Range   Opiates NONE DETECTED NONE DETECTED   Cocaine NONE DETECTED NONE DETECTED   Benzodiazepines NONE DETECTED NONE DETECTED   Amphetamines NONE DETECTED NONE DETECTED   Tetrahydrocannabinol NONE DETECTED NONE DETECTED   Barbiturates NONE DETECTED NONE DETECTED    Comment: (NOTE) DRUG SCREEN FOR MEDICAL PURPOSES ONLY.  IF CONFIRMATION IS NEEDED FOR ANY PURPOSE, NOTIFY LAB WITHIN 5 DAYS.  LOWEST DETECTABLE LIMITS FOR URINE DRUG SCREEN Drug Class                     Cutoff (ng/mL) Amphetamine and metabolites    1000 Barbiturate and metabolites    200 Benzodiazepine                 200 Tricyclics and metabolites     300 Opiates and metabolites        300 Cocaine and metabolites        300 THC                            50 Performed at [facility], [address], [place], [place] [address]     Medications:  Current Facility-Administered Medications  Medication Dose Route Frequency Provider Last Rate  Last Admin   amphetamine-dextroamphetamine (ADDERALL XR) 24 hr capsule 5 mg  5 mg Oral Daily Conklin, Erica R, PA-C       benztropine (COGENTIN) tablet 1 mg  1 mg Oral Daily Raynald Blend R, PA-C   1 mg at 07/22/21 0930   OLANZapine (ZYPREXA) tablet 7.5 mg  7.5 mg Oral QHS Raynald Blend R, PA-C   7.5 mg at 07/21/21 2243   risperiDONE (RISPERDAL) tablet 3 mg  3 mg Oral QHS Janell Quiet, PA-C   3 mg at 07/21/21 2242   Current Outpatient Medications  Medication Sig Dispense Refill   amphetamine-dextroamphetamine (ADDERALL) 5 MG tablet Take 1 tablet by mouth 2 (two) times daily.     benztropine (COGENTIN) 1 MG tablet Take 1 tablet (1 mg total) by mouth 2 (two) times daily as needed for tremors (Muscle stiffness and pain (EPS)). 60 tablet 0   benztropine (COGENTIN) 1 MG tablet Take 1 tablet (1 mg total) by mouth at bedtime. 30 tablet 0   melatonin 3 MG TABS tablet Take 1 tablet (3 mg total) by mouth at bedtime. 30 tablet 0   OLANZapine (ZYPREXA) 7.5 MG tablet Take 1 tablet (7.5 mg total) by mouth at bedtime for 30 doses. 30 tablet 0   risperiDONE (RISPERDAL) 3 MG tablet Take 1 tablet (3 mg total) by mouth 2 (two) times daily. 60 tablet 0    Musculoskeletal: Strength & Muscle Tone: within normal limits Gait & Station: normal Patient leans: N/A    Psychiatric Specialty Exam:  Presentation  General Appearance: Appropriate for Environment  Eye Contact:Fair  Speech:Clear and Coherent  Speech Volume:Decreased  Handedness:Right   Mood and Affect  Mood:-- ("tired")  Affect:Congruent; Appropriate   Thought Process  Thought Processes:Coherent; Goal Directed  Descriptions of Associations:Intact  Orientation:Full (Time, Place and Person)  Thought Content:Logical  History of Schizophrenia/Schizoaffective disorder:Yes  Duration of Psychotic Symptoms:Greater than six months  Hallucinations:Hallucinations: -- (had audible hallucinations of hearing sounds present on admision   but currently denies)  Ideas of Reference:None  Suicidal Thoughts:Suicidal Thoughts: No  Homicidal Thoughts:Homicidal Thoughts: No   Sensorium  Memory:Immediate Good; Recent Good; Remote Good  Judgment:Fair  Insight:Fair   Executive Functions  Concentration:Fair  Attention Span:Fair  Recall:Good  Fund of Knowledge:Good  Language:Good   Psychomotor Activity  Psychomotor Activity:Psychomotor Activity: Normal   Assets  Assets:Communication Skills; Desire for Improvement; Social Support ([facility] and family)   Sleep  Sleep:Sleep: Good Number of Hours of Sleep: 7    Physical Exam: Physical Exam Constitutional:      Appearance: Normal appearance.  Cardiovascular:     Rate and Rhythm: Normal rate.     Pulses: Normal pulses.  Pulmonary:     Effort: Pulmonary effort is normal.  Musculoskeletal:        General: Normal range of motion.     Cervical back: Normal range of motion.  Neurological:     General: No focal deficit present.     Mental Status: He is alert and oriented to person, place, and time.  Psychiatric:        Attention and Perception: Attention and perception normal.        Mood and Affect: Mood normal. Affect is flat.        Speech: Speech normal.        Behavior: Behavior normal. Behavior is cooperative.        Thought Content: Thought content normal. Thought content is not paranoid or delusional (has cleared since restarting medications).        Cognition and Memory: Cognition and memory normal.        Judgment: Judgment normal.    Review of Systems  Constitutional: Negative.   HENT: Negative.    Eyes: Negative.   Respiratory: Negative.    Cardiovascular: Negative.   Gastrointestinal: Negative.   Genitourinary: Negative.   Musculoskeletal: Negative.   Skin: Negative.   Neurological: Negative.   Endo/Heme/Allergies: Negative.   Psychiatric/Behavioral:  Negative for hallucinations (have resolved since restarting psychiatric  medications).    Blood pressure 132/87, pulse 96, temperature 98.2 F (36.8 C), resp. rate 18, height 6\' 1"  (1.854 m), weight 70.2 kg, SpO2 100 %. Body mass index is 20.41 kg/m.  Treatment Plan Summary: Plan- As per above assessment, there are no current grounds for involuntary commitment at this time.  Patient is not currently interested in inpatient services but expresses agreement to continue outpatient treatment with [facility].   At baseline patient has dx of schizophrenia and medication non-compliance. From his own admission, does well when on psychiatric medications and mentally decompensates, has positive symptoms of schizophrenia when he does take medications.  Since being restarted on risperidone and olanzapine, he demonstrates symptomatic improved in positive symptoms of schizophrenia.   At this time he would not benefit from psychiatric inpatient. Patient is future oriented and requesting homeless resources to follow-up once discharged from the hospital.  He denies need for medications, but ED provider tried to get him a 30 days supply prior to discharge but was told this could  not be done since he has medicaid.  Recommend he continue home meds,as previously prescribed and follow-up with [facility] for med mgmt.   I have asked SW to refer pt for ACT resources; and include homeless resources for shelters.     Disposition: No evidence of imminent risk to self or others at present.   Patient does not meet criteria for psychiatric inpatient admission. Supportive therapy provided about ongoing stressors. Discussed crisis plan, support from social network, calling 911, coming to the Emergency Department, and calling Suicide Hotline.  This service was provided via telemedicine using a 2-way, interactive audio and video technology.  Names of all persons participating in this telemedicine service and their role in this encounter. Name: Brewer Hitchman Role: Patient  Name: Ophelia Shoulder Role: PMHNP     Chales Abrahams, NP 07/22/2021 5:42 PM

## 2021-07-22 NOTE — ED Provider Notes (Signed)
Patient was cleared by psychiatry reassessment this morning after he reported and demonstrated improvement with his hallucinations due to being back on the medications.  Unfortunately the patient is on Medicaid, and he is also currently homeless -sort social work was consulted but was not able to provide refill of patient's medications at this time.  He was provided with paper prescriptions for 30 days.  He was also provided with resources for shelters and behavioral health follow-up.  As he is not needing hospitalization for medical reasons, and he does not meet inpatient criteria for psychiatric reasons, he will be discharged at this time.   [name], MD 07/22/21 [phone]

## 2021-08-31 ENCOUNTER — Emergency Department (HOSPITAL_COMMUNITY)
Admission: EM | Admit: 2021-08-31 | Discharge: 2021-09-03 | Disposition: A | Discharge status: Home / Self Care | Payer: Medicaid Other | Attending: Emergency Medicine | Admitting: Emergency Medicine

## 2021-08-31 ENCOUNTER — Other Ambulatory Visit: Payer: Self-pay

## 2021-08-31 ENCOUNTER — Encounter (HOSPITAL_COMMUNITY): Payer: Self-pay | Admitting: Emergency Medicine

## 2021-08-31 DIAGNOSIS — F251 Schizoaffective disorder, depressive type: Secondary | ICD-10-CM | POA: Diagnosis present

## 2021-08-31 DIAGNOSIS — F22 Delusional disorders: Secondary | ICD-10-CM | POA: Diagnosis not present

## 2021-08-31 DIAGNOSIS — Z20822 Contact with and (suspected) exposure to covid-19: Secondary | ICD-10-CM | POA: Insufficient documentation

## 2021-08-31 DIAGNOSIS — R441 Visual hallucinations: Secondary | ICD-10-CM | POA: Insufficient documentation

## 2021-08-31 DIAGNOSIS — R443 Hallucinations, unspecified: Secondary | ICD-10-CM

## 2021-08-31 DIAGNOSIS — G4700 Insomnia, unspecified: Secondary | ICD-10-CM | POA: Insufficient documentation

## 2021-08-31 DIAGNOSIS — Z8659 Personal history of other mental and behavioral disorders: Secondary | ICD-10-CM

## 2021-08-31 DIAGNOSIS — R440 Auditory hallucinations: Secondary | ICD-10-CM | POA: Diagnosis not present

## 2021-08-31 DIAGNOSIS — R45851 Suicidal ideations: Secondary | ICD-10-CM | POA: Diagnosis not present

## 2021-08-31 LAB — COMPREHENSIVE METABOLIC PANEL
ALT: 28 U/L (ref 0–44)
AST: 34 U/L (ref 15–41)
Albumin: 4.9 g/dL (ref 3.5–5.0)
Alkaline Phosphatase: 51 U/L (ref 38–126)
Anion gap: 9 (ref 5–15)
BUN: 9 mg/dL (ref 6–20)
CO2: 24 mmol/L (ref 22–32)
Calcium: 9.9 mg/dL (ref 8.9–10.3)
Chloride: 102 mmol/L (ref 98–111)
Creatinine, Ser: 0.88 mg/dL (ref 0.61–1.24)
GFR, Estimated: >60 mL/min (ref >60)
Glucose, Bld: 100 mg/dL — ABNORMAL HIGH (ref 70–99)
Potassium: 4.2 mmol/L (ref 3.5–5.1)
Sodium: 135 mmol/L (ref 135–145)
Total Bilirubin: 0.7 mg/dL (ref 0.3–1.2)
Total Protein: 7.8 g/dL (ref 6.5–8.1)

## 2021-08-31 LAB — RESP PANEL BY RT-PCR (FLU A&B, COVID) ARPGX2
Influenza A by PCR: NEGATIVE
Influenza B by PCR: NEGATIVE
SARS Coronavirus 2 by RT PCR: NEGATIVE

## 2021-08-31 LAB — RAPID URINE DRUG SCREEN, HOSP PERFORMED
Amphetamines: POSITIVE — AB
Barbiturates: NOT DETECTED
Benzodiazepines: NOT DETECTED
Cocaine: NOT DETECTED
Opiates: NOT DETECTED
Tetrahydrocannabinol: NOT DETECTED

## 2021-08-31 LAB — ETHANOL: Alcohol, Ethyl (B): <10 mg/dL (ref <10)

## 2021-08-31 LAB — CBC
HCT: 41.5 % (ref 39.0–52.0)
Hemoglobin: 13.9 g/dL (ref 13.0–17.0)
MCH: 31.6 pg (ref 26.0–34.0)
MCHC: 33.5 g/dL (ref 30.0–36.0)
MCV: 94.3 fL (ref 80.0–100.0)
Platelets: 358 10*3/uL (ref 150–400)
RBC: 4.4 MIL/uL (ref 4.22–5.81)
RDW: 14.2 % (ref 11.5–15.5)
WBC: 6.2 10*3/uL (ref 4.0–10.5)
nRBC: 0 % (ref 0.0–0.2)

## 2021-08-31 LAB — SALICYLATE LEVEL: Salicylate Lvl: <7 mg/dL — ABNORMAL LOW (ref 7.0–30.0)

## 2021-08-31 LAB — ACETAMINOPHEN LEVEL: Acetaminophen (Tylenol), Serum: 25 ug/mL (ref 10–30)

## 2021-08-31 NOTE — ED Notes (Addendum)
While patient  was in the restroom dressing out into hospital attire patient reports " this is crazy it feels like someone else is in my body but it's not me" patient reports hearing voices that are not him who are telling him to harm himself and do hurtful things. Patient's belonging placed in one belongings bag and locked in locker # 3 in [facility]. Safety sitter at bedside at this time and updated on the plan of care and the patient's report

## 2021-08-31 NOTE — ED Notes (Signed)
Patient verbalizes to the PA that he hears voices that sometimes tell him to harm himself. Per PA Britni paitnet is not allowed to leave the hospital and must be dressed out in psych scrubs & have things locked away. This RN to do so asap

## 2021-08-31 NOTE — ED Notes (Signed)
TTS in process 

## 2021-08-31 NOTE — ED Notes (Addendum)
Patient fully IVC'd 08/31/2021; copied for medical records; faxed to [facility]; original filed in [facility] folder and IVC documents delivered to [facility]. Expiration 09/07/2021

## 2021-08-31 NOTE — ED Provider Notes (Signed)
[facility] EMERGENCY DEPARTMENT Provider Note   CSN: [id number] Arrival date & time: 08/31/21  7846    History  Chief Complaint  Patient presents with   Insomnia    Wesley Peterson is a 36 y.o. male history of schizophrenia, not currently taking medications states he is here because he has not been able to sleep last for 5 days.  States he is also had decreased p.o. intake.  He states the voices keep telling him things.  He will not expound on what exactly these voices were telling him.  He insists that the voices that he hears are real and not from his schizophrenia. States he has been in a "trace" for 5 days. Denies SI, HI however does admit to command hallucinations. Denies illicet substances, etoh. No fever, cough, CP, SOB, abd pain, dysuria or hematuria. Admits to long standing groin pain without swelling or redness. Denies concerns for STDs.  According to nursing staff while patient was getting dressed into scrubs he stated that felt like "someone was inside my body" telling him to harm him self and do "harmful things."  HPI     Home Medications Prior to Admission medications   Medication Sig Start Date End Date Taking? Authorizing Provider  amphetamine-dextroamphetamine (ADDERALL) 5 MG tablet Take 1 tablet by mouth 2 (two) times daily. Patient not taking: Reported on 08/31/2021 06/23/21   [provider]  benztropine (COGENTIN) 1 MG tablet Take 1 tablet (1 mg total) by mouth 2 (two) times daily as needed for tremors (Muscle stiffness and pain (EPS)). Patient not taking: Reported on 08/31/2021 02/21/21 03/23/21  [name], DO  benztropine (COGENTIN) 1 MG tablet Take 1 tablet (1 mg total) by mouth at bedtime. Patient not taking: Reported on 08/31/2021 07/22/21 08/21/21  [name], MD  OLANZapine (ZYPREXA) 7.5 MG tablet Take 1 tablet (7.5 mg total) by mouth at bedtime for 30 doses. Patient not taking: Reported on 08/31/2021 07/22/21 08/21/21  [name], MD  risperiDONE (RISPERDAL) 3 MG tablet Take 1 tablet (3 mg total) by mouth 2 (two) times daily. Patient not taking: Reported on 08/31/2021 07/22/21 08/21/21  [name], MD      Allergies    Patient has no known allergies.    Review of Systems   Review of Systems  Constitutional: Negative.   HENT: Negative.    Respiratory: Negative.    Cardiovascular: Negative.   Genitourinary: Negative.   Musculoskeletal: Negative.   Skin: Negative.   Psychiatric/Behavioral:  Positive for hallucinations and sleep disturbance. The patient is nervous/anxious.   All other systems reviewed and are negative.   Physical Exam Updated Vital Signs BP (!) 145/98 (BP Location: Left Arm)   Pulse 78   Temp 98.6 F (37 C) (Oral)   Resp 16   SpO2 99%  Physical Exam Vitals and nursing note reviewed.  Constitutional:      General: He is not in acute distress.    Appearance: He is well-developed. He is not ill-appearing, toxic-appearing or diaphoretic.  HENT:     Head: Normocephalic and atraumatic.     Mouth/Throat:     Mouth: Mucous membranes are moist.  Eyes:     Pupils: Pupils are equal, round, and reactive to light.  Cardiovascular:     Rate and Rhythm: Normal rate and regular rhythm.     Pulses: Normal pulses.     Heart sounds: Normal heart sounds.  Pulmonary:     Effort: Pulmonary effort is  normal. No respiratory distress.     Breath sounds: Normal breath sounds.  Abdominal:     General: There is no distension.     Palpations: Abdomen is soft.  Genitourinary:    Comments: Declined Musculoskeletal:        General: Normal range of motion.     Cervical back: Normal range of motion and neck supple.  Skin:    General: Skin is warm and dry.  Neurological:     General: No focal deficit present.     Mental Status: He is alert and oriented to person, place, and time.  Psychiatric:        Attention and Perception: He perceives auditory and visual hallucinations.        Mood  and Affect: Affect is flat.        Behavior: Behavior is slowed.        Thought Content: Thought content is paranoid and delusional. Thought content does not include homicidal or suicidal ideation. Thought content does not include homicidal or suicidal plan.     Comments: Patient appears to be responding to internal stimuli, looking around room and attempting to answer questions.  Denies SI, HI however admits to things "inside of him" that are telling him to do harmful things to himself and others.  Perseverates that "there is someone inside me that is not me."  Adds to lack of sleep over the last 5 days and decreased p.o. intake however actively drinking water in bed without difficulty     ED Results / Procedures / Treatments   Labs (all labs ordered are listed, but only abnormal results are displayed) Labs Reviewed  COMPREHENSIVE METABOLIC PANEL - Abnormal; Notable for the following components:      Result Value   Glucose, Bld 100 (*)    All other components within normal limits  SALICYLATE LEVEL - Abnormal; Notable for the following components:   Salicylate Lvl <7.0 (*)    All other components within normal limits  RAPID URINE DRUG SCREEN, HOSP PERFORMED - Abnormal; Notable for the following components:   Amphetamines POSITIVE (*)    All other components within normal limits  RESP PANEL BY RT-PCR (FLU A&B, COVID) ARPGX2  ETHANOL  ACETAMINOPHEN LEVEL  CBC    EKG None  Radiology No results found.  Procedures Procedures    Medications Ordered in ED Medications - No data to display  ED Course/ Medical Decision Making/ A&P    36 year old history of schizophrenia states he is not currently taking medications here for evaluation of AVH.  Has had lack of sleep over the last 4 to 5 days, decreased p.o. intake.  Apparently states or someone inside of him who is controlling him telling him to do harmful things.  On exam does appear to be responding to internal stimuli  however  Admits to chronic groin pain without swelling, redness or warmth. He denies concern for STD. Offered GU exam however patient declined GU exam. States "nothing has changed."  Labs personally viewed and interpreted:  UDS positive for Amphematine > On Adderall Labs without significant findings   Patient medically cleared.  Disposition per psychiatry.  Patient wandering halls. Patient placed under IVC.                             Medical Decision Making Amount and/or Complexity of Data Reviewed External Data Reviewed: labs and notes. Labs: ordered. Decision-making details documented in ED  Course.  Risk Diagnosis or treatment significantly limited by social determinants of health.           Final Clinical Impression(s) / ED Diagnoses Final diagnoses:  Hallucinations  History of schizophrenia    Rx / DC Orders ED Discharge Orders     None         Deep Bonawitz A, PA-C 08/31/21 2001    Gloris Manchester, MD 09/01/21 [phone]

## 2021-08-31 NOTE — ED Provider Triage Note (Signed)
Emergency Medicine Provider Triage Evaluation Note  Wesley Peterson , a 36 y.o. male  was evaluated in triage.  Pt complains of inability to sleep as well as poor appetite for the past 4 to 5 days.  He states he has been in a "trancelike state" for that amount of time.  Difficult to ascertain whether or not patient has been taking his medicines at home as prescribed.  Patient reports auditory as well as visual hallucinations but denies suicidal or homicidal ideation.  Denies fever, chills, night sweats, chest pain, shortness of breath, abdominal pain, nausea, vomiting, urinary symptoms, change in bowel habits.  Patient is requesting food as well as a bed to spend the night..  Review of Systems  Positive: See above Negative:   Physical Exam  BP (!) 131/98 (BP Location: Right Arm)   Pulse 72   Temp 98.4 F (36.9 C) (Oral)   Resp 15   SpO2 99%  Gen:   Awake, no distress   Resp:  Normal effort  MSK:   Moves extremities without difficulty  Other:  No abdominal tenderness on exam.  Lungs clear to auscultation.  No obvious murmurs gallops or rubs with regular rate and rhythm.  Medical Decision Making  Medically screening exam initiated at 10:54 AM.  Appropriate orders placed.  Wesley Peterson was informed that the remainder of the evaluation will be completed by another provider, this initial triage assessment does not replace that evaluation, and the importance of remaining in the ED until their evaluation is complete.     Peter Garter, [place] 08/31/21 1056

## 2021-08-31 NOTE — ED Triage Notes (Addendum)
Patient with history of schizophrenia reports insomnia and poor appetite for the last four days. Patient states he believes this may be related to an event recently where "[he] was connected to other people and to something on the sides of his penis but [he] is no longer connected and that may be what happened". Patient denies SI, HI, AVH.

## 2021-09-01 MED ORDER — OLANZAPINE 5 MG PO TBDP
10.0000 mg | ORAL_TABLET | Freq: Three times a day (TID) | ORAL | Status: DC | PRN
Start: 2021-09-01 — End: 2021-09-03
  Administered 2021-09-01: 10 mg via ORAL
  Filled 2021-09-01: qty 2

## 2021-09-01 MED ORDER — ZIPRASIDONE MESYLATE 20 MG IM SOLR
20.0000 mg | INTRAMUSCULAR | Status: DC | PRN
Start: 2021-09-01 — End: 2021-09-03

## 2021-09-01 MED ORDER — OLANZAPINE 5 MG PO TABS
7.5000 mg | ORAL_TABLET | Freq: Every day | ORAL | Status: DC
  Administered 2021-09-01 – 2021-09-02 (×2): 7.5 mg via ORAL
  Filled 2021-09-01 (×2): qty 1

## 2021-09-01 MED ORDER — ZIPRASIDONE MESYLATE 20 MG IM SOLR: 20.0000 mg | INTRAMUSCULAR | Status: DC | PRN

## 2021-09-01 MED ORDER — AMPHETAMINE-DEXTROAMPHETAMINE 5 MG PO TABS: 5.0000 mg | ORAL_TABLET | Freq: Two times a day (BID) | ORAL | Status: DC

## 2021-09-01 MED ORDER — BENZTROPINE MESYLATE 1 MG PO TABS: 1.0000 mg | ORAL_TABLET | Freq: Two times a day (BID) | ORAL | Status: DC | PRN

## 2021-09-01 MED ORDER — LORAZEPAM 1 MG PO TABS: 1.0000 mg | ORAL_TABLET | ORAL | Status: DC | PRN

## 2021-09-01 MED ORDER — OLANZAPINE 5 MG PO TBDP: 5.0000 mg | ORAL_TABLET | Freq: Three times a day (TID) | ORAL | Status: DC | PRN

## 2021-09-01 MED ORDER — RISPERIDONE 2 MG PO TABS
2.0000 mg | ORAL_TABLET | Freq: Two times a day (BID) | ORAL | Status: DC
  Administered 2021-09-01: 2 mg via ORAL
  Filled 2021-09-01: qty 1
  Filled 2021-09-01: qty 2

## 2021-09-01 MED ORDER — LORAZEPAM 1 MG PO TABS
1.0000 mg | ORAL_TABLET | ORAL | Status: DC | PRN
Start: 2021-09-01 — End: 2021-09-03

## 2021-09-01 NOTE — Progress Notes (Signed)
Inpatient Behavioral Health Placement  Pt meets inpatient criteria per Ophelia Shoulder, NP. There are no available beds at [facility]. Referral was sent to the following facilities;   Destination Service Provider Address Phone Fax  [facility]  [address] [place], [place] [address] [phone]  [facility]  [address] [place] [address] [phone]  [facility]  [address] [place], [place] [address] [phone]  [facility]  [address] [place] [address] [phone]  [facility]  [address] [place] [address] [phone]  [facility]  [address] [place] [address] [phone]  [facility]  [address] [place] [address] [phone]  [facility]  [address] [place] [address] [phone]  [facility]  [address] [place] [address] [phone]  [facility]  [address], [place] [address] [phone]  [facility]  [address], [place] [address] [phone]  [facility]  [address] [place] [address] [phone]  [facility]  [address] [place] [address] [phone]  [facility]  [address] [place], [place] [address] [phone]  [facility]  [address] [place] [address] [phone]  [facility]  [address] [place] [address] [phone]  [facility]  [address] [place] [address] [phone]  [phone]    Situation ongoing,  CSW will follow up.   Maryjean Ka, MSW, [facility] 09/01/2021  @ 2:33 PM

## 2021-09-01 NOTE — ED Notes (Signed)
Pt has slept since this nurse came on shift at 0700. Pt answered questions during assessment but kept eyes closed. Denies pain, denies SI/HI, denies AVH at this time. Pt offered breakfast but went back to sleep instead

## 2021-09-01 NOTE — ED Notes (Signed)
Patient has started pacing the unit and saying "I asked you for help and you act like you don't know what I'm talking about" Patient started slamming bedside table into the wall and RN removed for safety; EDP notified for agitation meds; Pt agreeable to PO meds at this time-Monique,RN
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
## 2021-09-01 NOTE — ED Provider Notes (Signed)
Emergency Medicine Observation Re-evaluation Note  Iokepa Geffre is a 36 y.o. male, seen on rounds today.  Pt initially presented to the ED for complaints of Insomnia Currently, the patient is asleep.  Pt presented yesterday for hallucinations and not sleeping.  He was assessed early this am and inpatient psych was recommended.  He was agitated around 0148 and was given 10 mg zyprexa po and has slept since then.  Physical Exam  BP 131/77 (BP Location: Left Arm)   Pulse 64   Temp 98.4 F (36.9 C) (Oral)   Resp 18   SpO2 98%  Physical Exam General: asleep Cardiac: rrr Lungs: cta b Psych: asleep  ED Course / MDM  EKG:   I have reviewed the labs performed to date as well as medications administered while in observation.  Recent changes in the last 24 hours include inpatient placement recommended.  Plan  Current plan is for inpatient psych placement.  Angus Amini is not under involuntary commitment.     Jacalyn Lefevre, MD 09/01/21 [phone]

## 2021-09-01 NOTE — ED Notes (Signed)
Pt ambulated with steady gait to restroom and went back to room and right back to sleep. Pt has been cooperative with staff and meds but has otherwise slept all shift

## 2021-09-01 NOTE — Progress Notes (Addendum)
Inpatient Behavioral Health Placement  Pt meets inpatient criteria per Assunta Found, NP. There are no available beds at [facility] per [facility] Fransico Michael, RN.  Referral was sent to out of network providers.  Destination Service Provider Address Phone Fax  [facility]  [address] [place], [place] [address] [phone]  [facility]  [address] [place] [address] [phone]  [facility]  [address] [place], [place] [address] [phone]  [facility]  [address] [place] [address] [phone]  [facility]  [address] [place] [address] [phone]  [facility]  [address], [place] [address] [phone]  [facility]  [address] [place] [address] [phone]  [facility]  [address] [place] [address] [phone]  [facility]  [address], [place] [address] [phone]  [facility]  [address] [place] [address] [phone]  [facility]  [address], [place] [address] [phone]  [facility]  [address] [place] [address] [phone]  [facility]  [address] [place] [address] [phone]  [facility]  [address] [place], [place] [address] [phone]  [facility]  [address] [place] [address] [phone]  [facility]  [address], [place] [address] [phone]    Situation ongoing,  CSW will follow up.   Maryjean Ka, MSW, LCSWA 09/01/2021  @ 12:27 AM

## 2021-09-02 ENCOUNTER — Other Ambulatory Visit: Payer: Self-pay | Admitting: Psychiatry

## 2021-09-02 DIAGNOSIS — R45851 Suicidal ideations: Secondary | ICD-10-CM

## 2021-09-02 NOTE — ED Provider Notes (Signed)
  Physical Exam  BP 121/83 (BP Location: Right Arm)   Pulse 74   Temp 97.8 F (36.6 C) (Oral)   Resp 16   SpO2 98%   Physical Exam  Procedures  Procedures  ED Course / MDM    Medical Decision Making Amount and/or Complexity of Data Reviewed Labs: ordered.   Patient under IVC with inpatient treatment recommended.  No issues overnight.  Has been sleeping most of the time.       Benjiman Core, MD 09/02/21 [phone]

## 2021-09-02 NOTE — ED Notes (Signed)
Received verbal report from Ginny M RN at this time 

## 2021-09-02 NOTE — ED Notes (Signed)
Pt is currently eating and drinking a snack at this time. Pt ask if he is going to see a doctor. Advised not necessarily. Pt ask whats the point of him being here if he isn't going to see provider

## 2021-09-02 NOTE — ED Notes (Signed)
Medication not given by previous RN I came on at 1530 too close to next  dose

## 2021-09-02 NOTE — Progress Notes (Signed)
Pt was accepted to [facility] Today 09/02/21 PENDING IVC paperwork and EKG; Bed Assignment 403-1  Pt meets inpatient criteria per Laurey Morale, NP  Attending Physician will be Dr. Sherrie Mustache Massengill  Report can be called to: - -Adult unit: [phone]  Pt can arrive after: [facility] will coordinate per the pending items  Care Team notified: [facility], RN, Maxie Barb, NP, Epifania Gore, NT, and [address] [place], LCSWA 09/02/2021 @ 3:55 PM

## 2021-09-02 NOTE — Consult Note (Signed)
Telepsych Consultation   Reason for Consult:  psych consult Referring Physician:  Linwood Dibbles, PA-C Location of Patient:  [facility] [phone] Location of Provider: Behavioral Health TTS Department  Patient Identification: Wesley Peterson MRN:  [id number] Principal Diagnosis: Suicidal ideation Diagnosis:  Principal Problem:   Suicidal ideation Active Problems:   Schizoaffective disorder, depressive type (HCC)   Total Time spent with patient: 20 minutes  Subjective:   Wesley Peterson is a 36 y.o. male patient admitted with hallucinations.  Patient presents alert, slow to orient; unable to state appropriate date, "I've been in the hospital x3 days. I was tired and needed catch up on some sleep. I get delusional when I haven't had any sleep". Patient reports not sleeping "for days" unable to recall exact quantity; affect is flat, restrictive and withdrawn. Reports ongoing thoughts of wanting to harm himself. Possible thought blocking noted with frequent pauses during assessment. Denies any visual hallucinations at the time of assessment, reports having auditory hallucinations during the night. Reports active outpatient treatment with [facility].    HPI:  Wesley Peterson is a 36 year old male patient with past history of schizoaffective disorder, suicidal ideation. Currently living on the streets x2 years. Last worked x1 year ago. Family is in [place], [place]. Receives SSI; payee is mother. Outpatient service provider via [facility]); last refill noted 08/29/21. No wrap around services noted. UDS+amphetamines, BAL<10. PDMP reviewed; active prescription for Adderall last filled, 08/29/21.   Past Psychiatric History: schizoaffective disorder, depression, auditory hallucination, tardive dyskinesia  Risk to Self:   Risk to Others:   Prior Inpatient Therapy:   Prior Outpatient Therapy:    Past Medical History:  Past Medical History:  Diagnosis Date   Schizophrenia (HCC)     Schizophrenia, paranoid type (HCC) 02/02/2021   No past surgical history on file. Family History: No family history on file. Family Psychiatric  History: not noted Social History:  Social History   Substance and Sexual Activity  Alcohol Use Not Currently   Alcohol/week: 1.0 standard drink of alcohol   Types: 1 Cans of beer per week   Comment: occasional     Social History   Substance and Sexual Activity  Drug Use Never    Social History   Socioeconomic History   Marital status: Single    Spouse name: Not on file   Number of children: 1   Years of education: Not on file   Highest education level: Not on file  Occupational History   Occupation: Unemployed  Tobacco Use   Smoking status: Never   Smokeless tobacco: Never  Vaping Use   Vaping Use: Never used  Substance and Sexual Activity   Alcohol use: Not Currently    Alcohol/week: 1.0 standard drink of alcohol    Types: 1 Cans of beer per week    Comment: occasional   Drug use: Never   Sexual activity: Never  Other Topics Concern   Not on file  Social History Narrative   ** Merged History Encounter **       Pt lives with mother and other relatives.  Currently unemployed.  Receives outpatient psychiatry services through [facility].   Social Determinants of Health   Financial Resource Strain: Not on file  Food Insecurity: Not on file  Transportation Needs: Not on file  Physical Activity: Not on file  Stress: Not on file  Social Connections: Not on file   Additional Social History:    Allergies:  No Known Allergies  Labs:  No results found for this or any previous visit (from the past 48 hour(s)).   Medications:  Current Facility-Administered Medications  Medication Dose Route Frequency Provider Last Rate Last Admin   amphetamine-dextroamphetamine (ADDERALL) tablet 5 mg  5 mg Oral BID Ophelia Shoulder E, NP       benztropine (COGENTIN) tablet 1 mg  1 mg Oral BID PRN Chales Abrahams, NP       OLANZapine zydis  (ZYPREXA) disintegrating tablet 10 mg  10 mg Oral Q8H PRN Long, Arlyss Repress, MD   10 mg at 09/01/21 7628   And   LORazepam (ATIVAN) tablet 1 mg  1 mg Oral PRN Long, Arlyss Repress, MD       And   ziprasidone (GEODON) injection 20 mg  20 mg Intramuscular PRN Long, Arlyss Repress, MD       OLANZapine (ZYPREXA) tablet 7.5 mg  7.5 mg Oral QHS Ophelia Shoulder E, NP   7.5 mg at 09/01/21 2219   risperiDONE (RISPERDAL) tablet 2 mg  2 mg Oral BID Ophelia Shoulder E, NP   2 mg at 09/01/21 1546   Current Outpatient Medications  Medication Sig Dispense Refill   amphetamine-dextroamphetamine (ADDERALL) 5 MG tablet Take 1 tablet by mouth 2 (two) times daily. (Patient not taking: Reported on 08/31/2021)     benztropine (COGENTIN) 1 MG tablet Take 1 tablet (1 mg total) by mouth at bedtime. (Patient not taking: Reported on 08/31/2021) 30 tablet 0    Musculoskeletal: Strength & Muscle Tone: within normal limits Gait & Station: normal Patient leans: N/A  Psychiatric Specialty Exam:  Presentation  General Appearance: Casual; Bizarre  Eye Contact:Fair  Speech:Clear and Coherent  Speech Volume:Decreased  Handedness:Right   Mood and Affect  Mood:Depressed; Dysphoric  Affect:Restricted; Depressed; Congruent   Thought Process  Thought Processes:Goal Directed  Descriptions of Associations:Loose  Orientation:Partial  Thought Content:Logical  History of Schizophrenia/Schizoaffective disorder:Yes  Duration of Psychotic Symptoms:Greater than six months  Hallucinations:Hallucinations: None  Ideas of Reference:None  Suicidal Thoughts:Suicidal Thoughts: Yes, Active SI Active Intent and/or Plan: Without Intent; With Plan  Homicidal Thoughts:Homicidal Thoughts: No   Sensorium  Memory:Immediate Fair; Recent Fair; Remote Fair  Judgment:Poor  Insight:Present   Executive Functions  Concentration:Fair  Attention Span:Fair  Recall:Fair  Fund of Knowledge:Fair  Language:Fair   Psychomotor Activity   Psychomotor Activity:Psychomotor Activity: Normal   Assets  Assets:Communication Skills; Physical Health; Resilience; Social Support   Sleep  Sleep:Sleep: Good    Physical Exam: Physical Exam Vitals and nursing note reviewed.  Constitutional:      Appearance: He is normal weight. He is not ill-appearing.  HENT:     Head: Normocephalic.     Nose: Nose normal.     Mouth/Throat:     Mouth: Mucous membranes are moist.     Pharynx: Oropharynx is clear.  Eyes:     Pupils: Pupils are equal, round, and reactive to light.  Cardiovascular:     Rate and Rhythm: Normal rate.  Pulmonary:     Effort: Pulmonary effort is normal.  Abdominal:     Palpations: Abdomen is soft.  Musculoskeletal:        General: Normal range of motion.     Cervical back: Normal range of motion.  Skin:    General: Skin is warm and dry.  Neurological:     Mental Status: He is alert and oriented to person, place, and time.  Psychiatric:        Attention and Perception: He does not perceive  auditory or visual hallucinations.        Mood and Affect: Mood is depressed. Affect is flat and inappropriate.        Speech: Speech is delayed.        Behavior: Behavior is slowed and withdrawn.        Thought Content: Thought content includes suicidal ideation. Thought content includes suicidal plan.        Judgment: Judgment is inappropriate.    Review of Systems  Psychiatric/Behavioral:  Positive for depression and suicidal ideas.   All other systems reviewed and are negative.  Blood pressure 112/74, pulse 87, temperature 98.8 F (37.1 C), temperature source Oral, resp. rate 19, SpO2 96 %. There is no height or weight on file to calculate BMI.  Treatment Plan Summary: Daily contact with patient to assess and evaluate symptoms and progress in treatment, Medication management, and Plan seek inpatient placement for further observation and stabilization.   Disposition: Recommend psychiatric Inpatient admission  when medically cleared. Supportive therapy provided about ongoing stressors. Discussed crisis plan, support from social network, calling 911, coming to the Emergency Department, and calling Suicide Hotline.  This service was provided via telemedicine using a 2-way, interactive audio and video technology.  Names of all persons participating in this telemedicine service and their role in this encounter. Name: Maxie Barb Role: PMHNP  Name: Nelly Rout Role: Attending MD  Name: Felipa Emory Role: patient  Name:  Role:     Loletta Parish, NP 09/02/2021 7:46 PM

## 2021-09-02 NOTE — Progress Notes (Signed)
Inpatient Behavioral Health Placement  Pt meets inpatient criteria per ,Laurey Morale, NP. There are no available beds at [facility] per [facility] ACBrook McNichol, RN. Referral was sent to the following facilities;   Destination Service Provider Address Phone Fax  [facility]  [address] [place], [place] [address] [phone]  [facility]  [address] [place] [address] [phone]  [facility]  [address] [place], [place] [address] [phone]  [facility]  [address] [place] [address] [phone]  [facility]  [address] [place] [address] [phone]  [facility]  [address], [place] [address] [phone]  [facility]  [address] [place] [address] [phone]  [facility]  [address] [place] [address] [phone]  [facility]  [address], [place] [address] [phone]  [facility]  [address], [place] [address] [phone]  [facility]  [address], [place] [address] [phone]  [facility]  [address] [place] [address] [phone]  [facility]  [address] [place] [address] [phone]  [facility]  [address] [place], [place] [address] [phone]  [facility]  [address], [place] [address] [phone]  [facility]  [address] [place] [address] [phone]  [facility]  [address]  [place] [address] [phone]    Situation ongoing,  CSW will follow up.   Maryjean Ka, MSW, LCSWA 09/02/2021  @ 1:21 PM

## 2021-09-03 ENCOUNTER — Inpatient Hospital Stay (HOSPITAL_COMMUNITY)
Admission: AD | Admit: 2021-09-03 | Discharge: 2021-09-15 | DRG: 885 | Disposition: A | Discharge status: Home / Self Care | Payer: Medicaid Other | Source: Intra-hospital | Attending: Psychiatry | Admitting: Psychiatry

## 2021-09-03 ENCOUNTER — Other Ambulatory Visit: Payer: Self-pay

## 2021-09-03 ENCOUNTER — Encounter (HOSPITAL_COMMUNITY): Payer: Self-pay | Admitting: Psychiatry

## 2021-09-03 DIAGNOSIS — F209 Schizophrenia, unspecified: Principal | ICD-10-CM | POA: Diagnosis present

## 2021-09-03 DIAGNOSIS — F250 Schizoaffective disorder, bipolar type: Secondary | ICD-10-CM | POA: Diagnosis not present

## 2021-09-03 DIAGNOSIS — Z5900 Homelessness unspecified: Secondary | ICD-10-CM | POA: Diagnosis not present

## 2021-09-03 DIAGNOSIS — I10 Essential (primary) hypertension: Secondary | ICD-10-CM | POA: Diagnosis present

## 2021-09-03 DIAGNOSIS — Z91411 Personal history of adult psychological abuse: Secondary | ICD-10-CM

## 2021-09-03 DIAGNOSIS — F411 Generalized anxiety disorder: Secondary | ICD-10-CM | POA: Diagnosis present

## 2021-09-03 DIAGNOSIS — F410 Panic disorder [episodic paroxysmal anxiety] without agoraphobia: Secondary | ICD-10-CM | POA: Diagnosis present

## 2021-09-03 DIAGNOSIS — K5900 Constipation, unspecified: Secondary | ICD-10-CM | POA: Diagnosis present

## 2021-09-03 DIAGNOSIS — G2401 Drug induced subacute dyskinesia: Secondary | ICD-10-CM | POA: Diagnosis present

## 2021-09-03 DIAGNOSIS — F1510 Other stimulant abuse, uncomplicated: Secondary | ICD-10-CM | POA: Diagnosis present

## 2021-09-03 DIAGNOSIS — G4700 Insomnia, unspecified: Secondary | ICD-10-CM | POA: Diagnosis present

## 2021-09-03 DIAGNOSIS — R45851 Suicidal ideations: Secondary | ICD-10-CM | POA: Diagnosis not present

## 2021-09-03 DIAGNOSIS — Z79899 Other long term (current) drug therapy: Secondary | ICD-10-CM

## 2021-09-03 DIAGNOSIS — K219 Gastro-esophageal reflux disease without esophagitis: Secondary | ICD-10-CM | POA: Diagnosis present

## 2021-09-03 DIAGNOSIS — Z91410 Personal history of adult physical and sexual abuse: Secondary | ICD-10-CM

## 2021-09-03 DIAGNOSIS — Z560 Unemployment, unspecified: Secondary | ICD-10-CM

## 2021-09-03 DIAGNOSIS — Z91148 Patient's other noncompliance with medication regimen for other reason: Secondary | ICD-10-CM

## 2021-09-03 DIAGNOSIS — F200 Paranoid schizophrenia: Secondary | ICD-10-CM | POA: Diagnosis not present

## 2021-09-03 MED ORDER — RISPERIDONE 2 MG PO TBDP
2.0000 mg | ORAL_TABLET | Freq: Every day | ORAL | Status: DC
  Administered 2021-09-03: 2 mg via ORAL
  Filled 2021-09-03 (×4): qty 1

## 2021-09-03 MED ORDER — ZIPRASIDONE HCL 40 MG PO CAPS
40.0000 mg | ORAL_CAPSULE | Freq: Two times a day (BID) | ORAL | Status: DC
  Administered 2021-09-04: 40 mg via ORAL
  Filled 2021-09-03 (×3): qty 1

## 2021-09-03 MED ORDER — OLANZAPINE 5 MG PO TBDP
5.0000 mg | ORAL_TABLET | Freq: Three times a day (TID) | ORAL | Status: DC | PRN
  Administered 2021-09-04: 5 mg via ORAL
  Filled 2021-09-03: qty 1

## 2021-09-03 MED ORDER — MAGNESIUM HYDROXIDE 400 MG/5ML PO SUSP: 30.0000 mL | Freq: Every day | ORAL | Status: DC | PRN

## 2021-09-03 MED ORDER — OLANZAPINE 15 MG PO TBDP
7.5000 mg | ORAL_TABLET | Freq: Every day | ORAL | Status: DC
  Filled 2021-09-03 (×2): qty 0.5

## 2021-09-03 MED ORDER — BENZTROPINE MESYLATE 1 MG PO TABS: 1.0000 mg | ORAL_TABLET | Freq: Two times a day (BID) | ORAL | Status: DC | PRN

## 2021-09-03 MED ORDER — ZIPRASIDONE MESYLATE 20 MG IM SOLR: 20.0000 mg | INTRAMUSCULAR | Status: DC | PRN

## 2021-09-03 MED ORDER — ENSURE ENLIVE PO LIQD
237.0000 mL | Freq: Two times a day (BID) | ORAL | Status: DC
Start: 2021-09-03 — End: 2021-09-15
  Administered 2021-09-03 – 2021-09-15 (×22): 237 mL via ORAL
  Filled 2021-09-03 (×29): qty 237

## 2021-09-03 MED ORDER — LORAZEPAM 1 MG PO TABS: 1.0000 mg | ORAL_TABLET | ORAL | Status: DC | PRN

## 2021-09-03 MED ORDER — ACETAMINOPHEN 325 MG PO TABS
650.0000 mg | ORAL_TABLET | Freq: Four times a day (QID) | ORAL | Status: DC | PRN
  Administered 2021-09-06 – 2021-09-14 (×9): 650 mg via ORAL
  Filled 2021-09-03 (×9): qty 2

## 2021-09-03 MED ORDER — ALUM & MAG HYDROXIDE-SIMETH 200-200-20 MG/5ML PO SUSP: 30.0000 mL | ORAL | Status: DC | PRN

## 2021-09-03 NOTE — Group Note (Signed)
  [facility] LCSW Group Therapy Note  Date/Time:  09/03/2021   Type of Therapy and Topic:  Group Therapy:  Feelings About Hospitalization  Participation Level:  Did Not Attend   Description of Group This process group involved patients discussing their feelings related to being hospitalized, as well as the benefits they see to being in the hospital.  These feelings and benefits were itemized.  The group then brainstormed specific ways in which they could seek those same benefits when they discharge and return home.  Therapeutic Goals Patient will identify and describe positive and negative feelings related to hospitalization Patient will verbalize benefits of hospitalization to themselves personally Patients will brainstorm together ways they can obtain similar benefits in the outpatient setting, identify barriers to wellness and possible solutions  Summary of Patient Progress:  The patient did not attend this group.  Therapeutic Modalities Cognitive Behavioral Therapy Motivational Interviewing    Selig Wampole, LCSWA 09/03/2021, 10:42 AM    

## 2021-09-03 NOTE — BH Assessment (Signed)
Clinician provided Bonita Quin, RN pt's accepting information to [facility]:  "Pt was accepted to [facility] Today 09/02/21 PENDING IVC paperwork and EKG; Bed Assignment 403-1   Pt meets inpatient criteria per Laurey Morale, NP   Attending Physician will be Dr. Sherrie Mustache Massengill   Report can be called to: - -Adult unit: [phone]   Pt can arrive after: [facility] will coordinate per the pending items   Care Team notified: [facility], RN, Maxie Barb, NP, Epifania Gore, NT, and Lanora Manis   *RN to coordinate care with [facility] nursing staff.*  Redmond Pulling, MS, [facility], [facility] Triage Specialist [phone]

## 2021-09-03 NOTE — Progress Notes (Signed)
Pt is a 36 y.o. male who was involuntarily admitted at [facility] after having been at [facility].  Pt has been having auditory hallucinations that are telling pt that he is a bad person and for him to harm himself.  Pt says he will not act on those voices, but that they are disturbing to him.  Pt says he drinks alcohol occasionally.  Pt denies that he takes illicit drugs.  Pt has not been sleeping well and he has a poor appetite.  Pt is calm during assessment but gives limited responses to questions asked of him.  Pt had not been taking his medications prior to his admission at [facility].  Pt signed admission paperwork and was cooperative during assessment.  Pt in agreement with plan of care.  Pt is resting in bed at this time.  RN will continue to assess for needs and /or concerns and intervene as indicated.

## 2021-09-03 NOTE — ED Notes (Signed)
Breakfast order placed ?
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
## 2021-09-03 NOTE — Progress Notes (Signed)
   09/03/21 2200  Psych Admission Type (Psych Patients Only)  Admission Status Involuntary  Psychosocial Assessment  Patient Complaints Anxiety;Depression  Eye Contact Brief  Facial Expression Sad  Affect Depressed  Speech Logical/coherent  Interaction Minimal;Forwards little  Motor Activity Slow  Appearance/Hygiene Unremarkable  Behavior Characteristics Guarded  Mood Depressed  Thought Process  Coherency WDL  Content WDL  Delusions None reported or observed  Perception UTA  Hallucination None reported or observed  Judgment Poor  Confusion None  Danger to Self  Current suicidal ideation? Denies  Danger to Others  Danger to Others None reported or observed

## 2021-09-03 NOTE — ED Notes (Signed)
Currently waiting for transport to take pt at this time

## 2021-09-03 NOTE — ED Notes (Signed)
[employer] arrived for transport to facility

## 2021-09-03 NOTE — ED Notes (Signed)
Secretary called to request transportation arrangements to be made

## 2021-09-03 NOTE — ED Notes (Signed)
Pt will be moved to [facility]. Will provide verbal report to day shift nurse. Nurse in the assignment is aware that the pt will be moved there at this time. Sitter will go with pt
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
## 2021-09-03 NOTE — [facility] Group Notes (Signed)
Adult Psychoeducational Group Note  Date:  09/03/2021 Time:  8:22 PM  Group Topic/Focus:  Wrap-Up Group:   The focus of this group is to help patients review their daily goal of treatment and discuss progress on daily workbooks.  Participation Level:  Did Not Attend  Participation Quality:    Affect:    Cognitive:    Insight:   Engagement in Group:    Modes of Intervention:    Additional Comments:    Baldwin Jamaica 09/03/2021, 8:22 PM
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
## 2021-09-03 NOTE — Tx Team (Addendum)
Initial Treatment Plan 09/03/2021 8:06 AM Felipa Emory GNO:[id number]    PATIENT STRESSORS: Financial difficulties   Marital or family conflict   Medication change or noncompliance     PATIENT STRENGTHS: General fund of knowledge    PATIENT IDENTIFIED PROBLEMS: Auditory hallucinations  Depression  Insomnia  Poor appetite               DISCHARGE CRITERIA:  Ability to meet basic life and health needs Improved stabilization in mood, thinking, and/or behavior Motivation to continue treatment in a less acute level of care  PRELIMINARY DISCHARGE PLAN: Attend aftercare/continuing care group Outpatient therapy  PATIENT/FAMILY INVOLVEMENT: This treatment plan has been presented to and reviewed with the patient, Wesley Peterson. The patient has been given the opportunity to ask questions and make suggestions.  Garnette Scheuermann, RN 09/03/2021, 8:06 AM

## 2021-09-03 NOTE — Progress Notes (Signed)
Adult Psychoeducational Group Note  Date:  09/03/2021 Time:  11:05 AM  Pt did not attend the Orientation group?

## 2021-09-03 NOTE — Progress Notes (Signed)
Pt is A&OX4, calm, isolative, guarded, and selectively mute.  Mood and affect are congruent. Pt appetite is ok. No complaints of anxiety, distress, pain and/or discomfort at this time. Pt's memory appears to be grossly intact, and Pt hasn't displayed any injurious behaviors. Pt is medication compliant. There's no evidence of suicidal intent. Psychomotor activity was WNL. No s/s of Parkinson, Dystonia, Akathisia and/or Tardive Dyskinesia noted.

## 2021-09-03 NOTE — ED Notes (Signed)
Advised pt would be going to [facility] accepting provider Dr. Marla Roe. Spoke with Zada Finders RN for report at this time

## 2021-09-03 NOTE — [facility] Group Notes (Signed)
[facility] Group Notes:  (Nursing)  Date:  09/03/2021  Time:  1300  Type of Therapy:  Psychoeducational Skills  Participation Level:  Did Not Attend   Shela Nevin 09/03/2021, 2:32 PM

## 2021-09-04 MED ORDER — OLANZAPINE 5 MG PO TBDP
5.0000 mg | ORAL_TABLET | Freq: Two times a day (BID) | ORAL | Status: DC
  Administered 2021-09-04 – 2021-09-05 (×2): 5 mg via ORAL
  Filled 2021-09-04 (×8): qty 1

## 2021-09-04 NOTE — Group Note (Signed)
[facility] LCSW Group Therapy Note   Group Date: 09/04/2021 Start Time: 1000 End Time: 1100   Type of Therapy/Topic:  Group Therapy:  Emotion Regulation  Participation Level:  None   Mood:  Description of Group:    The purpose of this group is to assist patients in learning to regulate negative emotions and experience positive emotions. Patients will be guided to discuss ways in which they have been vulnerable to their negative emotions. These vulnerabilities will be juxtaposed with experiences of positive emotions or situations, and patients challenged to use positive emotions to combat negative ones. Special emphasis will be placed on coping with negative emotions in conflict situations, and patients will process healthy conflict resolution skills.  Therapeutic Goals: Patient will identify two positive emotions or experiences to reflect on in order to balance out negative emotions:  Patient will label two or more emotions that they find the most difficult to experience:  Patient will be able to demonstrate positive conflict resolution skills through discussion or role plays:   Summary of Patient Progress:   Tranell attended group session but did not provide any input in the session    Therapeutic Modalities:   Cognitive Behavioral Therapy Feelings Identification Dialectical Behavioral Therapy   Marinda Elk, LCSW

## 2021-09-04 NOTE — Progress Notes (Signed)
[facility] MD Progress Note  09/04/2021 4:27 PM Wesley Peterson  MRN:  [id number]  Subjective:    Brief history: Wesley Peterson is a 36 y.o [place] American male with a history of Schizophrenia who presented voluntarily to the [facility] for treatment and stabilization of his mood, from [facility] ER with complaints of worsening auditory hallucinations, passive SI & depressive symptoms in the context of medication non compliance and homelessness.  Urine drug screen positive for methamphetamines.  Yesterday's psychiatric team recommendations: -09/04/21 D/C Geodon 40 mg BID with meals for psychosis -Start Zyprexa 5 mg po BID -Continue Agitation protocol as per MAR (Zyprexa PO/IM PRN) -Continue Trazodone 50 mg nightly PRN for insomnia -Continue Hydroxyzine 25 mg PRN TID for anxiety  Today's assessment: Patient was seen face-to-face and examined in his room walking around and sitting on the air vent.  Able to respond to few questions only, as he is a poor historian.  Patient walks up and down the room during the encounter and answers most of the questions with I do not know or may be.  Nursing staff reported that patient seemed to be responding to internal stimuli, and also endorses auditory and visual hallucinations.  He stated that the nursing staff, "I feel like I am having an out of body experience."  Zyprexa as needed was administered at this time.  He was apprehensive about the medication, however, he took it without difficulty.  Participated in therapeutic milieu x 2, however, not able to follow along completely. Taking prescribed medications as ordered  without adverse reaction. Slept most of the afternoon.  Less depressed mood noted.  Appetite good, consuming most of his food and drinking Ensure.  Endorsed sleeping through the night last night.  Denies suicidal ideation, homicidal ideation, and unable to express if he is experiencing auditory or visual hallucinations.  Continues on every 15 minutes unit  monitoring.   Principal Problem: Schizoaffective disorder, bipolar type (HCC)  Diagnosis: Principal Problem:   Schizoaffective disorder, bipolar type (HCC)  Total Time spent with patient: 30 minutes  Past Psychiatric History: Schizophrenia  Past Medical History:  Past Medical History:  Diagnosis Date   Schizophrenia (HCC)    Schizophrenia, paranoid type (HCC) 02/02/2021   History reviewed. No pertinent surgical history.  Family History: History reviewed. No pertinent family history.  Family Psychiatric  History: None reported Social History:  Social History   Substance and Sexual Activity  Alcohol Use Not Currently   Alcohol/week: 1.0 standard drink of alcohol   Types: 1 Cans of beer per week   Comment: occasional     Social History   Substance and Sexual Activity  Drug Use Never    Social History   Socioeconomic History   Marital status: Single    Spouse name: Not on file   Number of children: 1   Years of education: Not on file   Highest education level: Not on file  Occupational History   Occupation: Unemployed  Tobacco Use   Smoking status: Never   Smokeless tobacco: Never  Vaping Use   Vaping Use: Never used  Substance and Sexual Activity   Alcohol use: Not Currently    Alcohol/week: 1.0 standard drink of alcohol    Types: 1 Cans of beer per week    Comment: occasional   Drug use: Never   Sexual activity: Never  Other Topics Concern   Not on file  Social History Narrative   ** Merged History Encounter **  Pt lives with mother and other relatives.  Currently unemployed.  Receives outpatient psychiatry services through [facility].   Social Determinants of Health   Financial Resource Strain: Not on file  Food Insecurity: Not on file  Transportation Needs: Not on file  Physical Activity: Not on file  Stress: Not on file  Social Connections: Not on file   Additional Social History:    Sleep: Good  Appetite:  Good  Current  Medications: Current Facility-Administered Medications  Medication Dose Route Frequency Provider Last Rate Last Admin   acetaminophen (TYLENOL) tablet 650 mg  650 mg Oral Q6H PRN Leevy-Johnson, Brooke A, NP       alum & mag hydroxide-simeth (MAALOX/MYLANTA) 200-200-20 MG/5ML suspension 30 mL  30 mL Oral Q4H PRN Leevy-Johnson, Brooke A, NP       feeding supplement (ENSURE ENLIVE / ENSURE PLUS) liquid 237 mL  237 mL Oral BID BM Attiah, Nadir, MD   237 mL at 09/04/21 1003   OLANZapine zydis (ZYPREXA) disintegrating tablet 5 mg  5 mg Oral Q8H PRN Leevy-Johnson, Brooke A, NP   5 mg at 09/04/21 1023   And   LORazepam (ATIVAN) tablet 1 mg  1 mg Oral PRN Leevy-Johnson, Brooke A, NP       And   ziprasidone (GEODON) injection 20 mg  20 mg Intramuscular PRN Leevy-Johnson, Brooke A, NP       magnesium hydroxide (MILK OF MAGNESIA) suspension 30 mL  30 mL Oral Daily PRN Leevy-Johnson, Brooke A, NP       OLANZapine zydis (ZYPREXA) disintegrating tablet 5 mg  5 mg Oral BID Samaiya Awadallah, Jesusita Oka, FNP        Lab Results: No results found for this or any previous visit (from the past 48 hour(s)).  Blood Alcohol level:  Lab Results  Component Value Date   ETH <10 08/31/2021   ETH <10 07/21/2021    Metabolic Disorder Labs: Lab Results  Component Value Date   HGBA1C 5.9 (H) 06/02/2021   MPG 122.63 06/02/2021   MPG 126 02/03/2021   Lab Results  Component Value Date   PROLACTIN 17.5 (H) 06/02/2021   PROLACTIN 23.4 (H) 11/28/2018   Lab Results  Component Value Date   CHOL 137 06/02/2021   TRIG 105 06/02/2021   HDL 36 (L) 06/02/2021   CHOLHDL 3.8 06/02/2021   VLDL 21 06/02/2021   LDLCALC 80 06/02/2021   LDLCALC 80 01/30/2021    Physical Findings: AIMS: Facial and Oral Movements Muscles of Facial Expression: None, normal Lips and Perioral Area: None, normal Jaw: None, normal Tongue: None, normal,Extremity Movements Upper (arms, wrists, hands, fingers): None, normal Lower (legs, knees, ankles,  toes): None, normal, Trunk Movements Neck, shoulders, hips: None, normal, Overall Severity Severity of abnormal movements (highest score from questions above): None, normal Incapacitation due to abnormal movements: None, normal Patient's awareness of abnormal movements (rate only patient's report): No Awareness, Dental Status Current problems with teeth and/or dentures?: No Does patient usually wear dentures?: No  CIWA:    COWS:     Musculoskeletal: Strength & Muscle Tone: within normal limits Gait & Station: normal Patient leans: N/A  Psychiatric Specialty Exam:  Presentation  General Appearance: Appropriate for Environment; Bizarre  Eye Contact:Fair  Speech:Clear and Coherent; Slow  Speech Volume:Normal  Handedness:Right  Mood and Affect  Mood:Anxious; Dysphoric  Affect:Flat; Constricted  Thought Process  Thought Processes:Linear  Descriptions of Associations:Loose  Orientation:Full (Time, Place and Person)  Thought Content:Paranoid Ideation; Tangential  History of Schizophrenia/Schizoaffective disorder:Yes  Duration of Psychotic Symptoms:Greater than six months  Hallucinations:Hallucinations: Auditory Description of Auditory Hallucinations: "Hearing voices, I think"  Ideas of Reference:Paranoia  Suicidal Thoughts:Suicidal Thoughts: No SI Active Intent and/or Plan: -- (n/a)  Homicidal Thoughts:Homicidal Thoughts: No  Sensorium  Memory:Immediate Fair; Recent Fair; Remote Fair  Judgment:Poor  Insight:Shallow  Executive Functions  Concentration:Fair  Attention Span:Fair  Recall:Poor  Fund of Knowledge:Poor  Language:Fair  Psychomotor Activity  Psychomotor Activity:Psychomotor Activity: Normal  Assets  Assets:Physical Health; Communication Skills  Sleep  Sleep:Sleep: Good Number of Hours of Sleep: 6  Physical Exam: Physical Exam Vitals and nursing note reviewed.  Constitutional:      Appearance: Normal appearance.  HENT:      Head: Normocephalic and atraumatic.     Right Ear: External ear normal.     Left Ear: External ear normal.     Nose: Nose normal.     Mouth/Throat:     Pharynx: Oropharynx is clear.  Eyes:     Conjunctiva/sclera: Conjunctivae normal.     Pupils: Pupils are equal, round, and reactive to light.  Cardiovascular:     Rate and Rhythm: Tachycardia present.     Comments: BP 118/90, P 108, Nursing staff to recheck Abdominal:     Palpations: Abdomen is soft.  Genitourinary:    Comments: deferred Musculoskeletal:        General: Normal range of motion.     Cervical back: Normal range of motion and neck supple.  Skin:    General: Skin is warm.  Neurological:     General: No focal deficit present.     Mental Status: He is alert and oriented to person, place, and time.  Psychiatric:     Comments: Psychotic    Review of Systems  Constitutional: Negative.  Negative for chills and fever.  HENT: Negative.  Negative for hearing loss and tinnitus.   Eyes: Negative.  Negative for blurred vision and double vision.  Respiratory: Negative.  Negative for cough, sputum production, shortness of breath and wheezing.   Cardiovascular: Negative.  Negative for chest pain and palpitations.       BP 118/90, P 108, Nursing staff to recheck  Gastrointestinal: Negative.  Negative for heartburn, nausea and vomiting.  Genitourinary: Negative.  Negative for dysuria, frequency and urgency.  Musculoskeletal: Negative.  Negative for myalgias and neck pain.  Skin: Negative.  Negative for itching and rash.  Neurological: Negative.  Negative for dizziness and headaches.  Endo/Heme/Allergies: Negative.  Negative for environmental allergies and polydipsia. Does not bruise/bleed easily.  Psychiatric/Behavioral:  Positive for hallucinations and substance abuse. The patient is nervous/anxious.    Blood pressure (!) 118/90, pulse (!) 108, temperature 98.3 F (36.8 C), temperature source Oral, resp. rate 18, height 6\' 1"   (1.854 m), weight 66.2 kg, SpO2 99 %. Body mass index is 19.26 kg/m.  Treatment Plan Summary: Daily contact with patient to assess and evaluate symptoms and progress in treatment and Medication management  Treatment Plan Summary: CBC Chemistry Profile HbAIC UDS UA   Observation Level/Precautions:  15 minute checks  Laboratory:  CBC Chemistry Profile HbAIC UDS UA  Psychotherapy: Therapeutic milieu  Medications: See MAR  Consultations: Pending  Discharge Concerns: Safety  Estimated LOS: 5 to 7 days  Other:      Physician Treatment Plan for Primary Diagnosis: Schizoaffective disorder, bipolar type (HCC) Long Term Goal(s): Improvement in symptoms so as ready for discharge   Short Term Goals: Ability to identify changes in lifestyle to reduce recurrence of condition will  improve, Ability to verbalize feelings will improve, Ability to disclose and discuss suicidal ideas, Ability to demonstrate self-control will improve, Ability to identify and develop effective coping behaviors will improve, Ability to maintain clinical measurements within normal limits will improve, Compliance with prescribed medications will improve, and Ability to identify triggers associated with substance abuse/mental health issues will improve   Physician Treatment Plan for Secondary Diagnosis: Principal Problem:   Schizoaffective disorder, bipolar type (HCC)   PLAN Safety and Monitoring: Voluntary admission to inpatient psychiatric unit for safety, stabilization and treatment Daily contact with patient to assess and evaluate symptoms and progress in treatment Patient's case to be discussed in multi-disciplinary team meeting Observation Level : q15 minute checks Vital signs: q12 hours Precautions: Safety, No Roommate due to paranoia   Long Term Goal(s): Improvement in symptoms so as ready for discharge   Short Term Goals: Ability to verbalize feelings will improve, Ability to disclose and discuss suicidal  ideas, and Compliance with prescribed medications will improve   Diagnoses Principal Problem:   Schizophrenia (HCC) Active Problems:   Anxiety state   Insomnia   Medications Plan: Plan: 1. stop risperdal due to stiffness and h/o TD. 2. stop cogentin due to h/o TD.,  3. D/C Geodon 40 mg bid with meals., 4. Start Zyprexa 5 mg po BID, 5. change agitation protocol to zyprexa 5 mg tid prn PO and IM    -09/04/21 D/C Geodon 40 mg BID with meals for psychosis -Start Zyprexa 5 mg po BID -Continue Agitation protocol as per MAR (Zyprexa PO/IM PRN) -Continue Trazodone 50 mg nightly PRN for insomnia -Continue Hydroxyzine 25 mg PRN TID for anxiety   Other PRNS -Continue Tylenol 650 mg every 6 hours PRN for mild pain -Continue Maalox 30 mg every 4 hrs PRN for indigestion -Continue Milk of Magnesia as needed every 6 hrs for constipation   Discharge Planning: Social work and case management to assist with discharge planning and identification of hospital follow-up needs prior to discharge Estimated LOS: 5-7 days Discharge Concerns: Need to establish a safety plan; Medication compliance and effectiveness Discharge Goals: Return home with outpatient referrals for mental health follow-up including medication management/psychotherapy     I certify that inpatient services furnished can reasonably be expected to improve the patient's condition.     Cecilie Lowers, FNP 09/04/2021, 4:27 PM

## 2021-09-04 NOTE — Progress Notes (Signed)
Adult Psychoeducational Group Note  Date:  09/04/2021 Time:  10:41 AM  Group Topic/Focus:  Emotional Education:   The focus of this group is to discuss what feelings/emotions are, and how they are experienced.  Participation Level:  Active  Participation Quality:  Appropriate  Affect:  Appropriate  Cognitive:  Appropriate  Insight: Appropriate  Engagement in Group:  Engaged  Modes of Intervention:  Activity  Additional Comments:  Pt attended the reflection group and remained appropriate and engaged throughout the duration of the group.   Sheran Lawless 09/04/2021, 10:41 AM

## 2021-09-04 NOTE — Progress Notes (Signed)
Pt medication complaint this morning. Pt attempted some groups with noted difficulty concentrating. Pt laughs inappropriately and looks to the side at times as if responding to internal stimuli. Pt endorses auditory and visual hallucinations. Pt states " I feel like I'm having an out of body experience." zyprexa administered PRN. Pt apprehensive of medication but did accept. 15 minute checks ongoing for safety Will continue to monitor decrease stimuli, and offer therapeutic milieu.

## 2021-09-04 NOTE — Progress Notes (Signed)
Adult Psychoeducational Group Note  Date:  09/04/2021 Time:  10:40 AM  Group Topic/Focus:  Goals Group:   The focus of this group is to help patients establish daily goals to achieve during treatment and discuss how the patient can incorporate goal setting into their daily lives to aide in recovery.  Participation Level:  Active  Participation Quality:  Appropriate  Affect:  Appropriate  Cognitive:  Disorganized  Insight: Appropriate  Engagement in Group:  Engaged  Modes of Intervention:  Activity  Additional Comments:  Pt attended the goals group and remained appropriate and engaged throughout the duration of the group.   Fara Olden O 09/04/2021, 10:40 AM

## 2021-09-05 ENCOUNTER — Encounter (HOSPITAL_COMMUNITY): Payer: Self-pay

## 2021-09-05 DIAGNOSIS — F250 Schizoaffective disorder, bipolar type: Secondary | ICD-10-CM

## 2021-09-05 MED ORDER — TRAZODONE HCL 50 MG PO TABS
50.0000 mg | ORAL_TABLET | Freq: Every evening | ORAL | Status: DC | PRN
  Administered 2021-09-06 – 2021-09-10 (×3): 50 mg via ORAL
  Filled 2021-09-05 (×4): qty 1

## 2021-09-05 MED ORDER — LORAZEPAM 1 MG PO TABS
1.0000 mg | ORAL_TABLET | Freq: Four times a day (QID) | ORAL | Status: DC | PRN
Start: 2021-09-05 — End: 2021-09-08

## 2021-09-05 MED ORDER — ZIPRASIDONE MESYLATE 20 MG IM SOLR
20.0000 mg | Freq: Four times a day (QID) | INTRAMUSCULAR | Status: DC | PRN
Start: 2021-09-05 — End: 2021-09-08

## 2021-09-05 MED ORDER — OLANZAPINE 10 MG PO TBDP
10.0000 mg | ORAL_TABLET | Freq: Every day | ORAL | Status: DC
  Administered 2021-09-06: 10 mg via ORAL
  Filled 2021-09-05 (×3): qty 1

## 2021-09-05 MED ORDER — AMLODIPINE BESYLATE 5 MG PO TABS
5.0000 mg | ORAL_TABLET | Freq: Every day | ORAL | Status: DC
  Administered 2021-09-05 – 2021-09-15 (×11): 5 mg via ORAL
  Filled 2021-09-05 (×6): qty 1
  Filled 2021-09-05 (×2): qty 7
  Filled 2021-09-05 (×9): qty 1

## 2021-09-05 MED ORDER — HYDROXYZINE HCL 25 MG PO TABS
25.0000 mg | ORAL_TABLET | Freq: Three times a day (TID) | ORAL | Status: DC | PRN
  Administered 2021-09-06: 25 mg via ORAL
  Filled 2021-09-05: qty 1

## 2021-09-05 MED ORDER — OLANZAPINE 5 MG PO TBDP
5.0000 mg | ORAL_TABLET | Freq: Three times a day (TID) | ORAL | Status: DC | PRN
Start: 2021-09-05 — End: 2021-09-08

## 2021-09-05 NOTE — Progress Notes (Signed)
   09/05/21 0800  Psych Admission Type (Psych Patients Only)  Admission Status Involuntary  Psychosocial Assessment  Patient Complaints Decreased concentration  Eye Contact Avoids  Facial Expression Blank  Affect Depressed  Speech Slow  Interaction Cautious  Motor Activity Slow  Appearance/Hygiene Unremarkable  Behavior Characteristics Guarded  Mood Depressed  Aggressive Behavior  Targets Other (Comment) (No aggresive behavior noted.)  Thought Process  Coherency Blocking  Content Preoccupation  Delusions Paranoid  Perception Hallucinations  Hallucination Auditory  Judgment Poor  Confusion WDL  Danger to Self  Current suicidal ideation? Denies  Danger to Others  Danger to Others None reported or observed

## 2021-09-05 NOTE — BH IP Treatment Plan (Signed)
Interdisciplinary Treatment and Diagnostic Plan Update  09/05/2021 Time of Session: 08\30 Wesley Peterson MRN: [id number]  Principal Diagnosis: Schizoaffective disorder, bipolar type [facility])  Secondary Diagnoses: Principal Problem:   Schizoaffective disorder, bipolar type (HCC)   Current Medications:  Current Facility-Administered Medications  Medication Dose Route Frequency Provider Last Rate Last Admin   acetaminophen (TYLENOL) tablet 650 mg  650 mg Oral Q6H PRN Leevy-Johnson, Brooke A, NP       alum & mag hydroxide-simeth (MAALOX/MYLANTA) 200-200-20 MG/5ML suspension 30 mL  30 mL Oral Q4H PRN Leevy-Johnson, Brooke A, NP       amLODipine (NORVASC) tablet 5 mg  5 mg Oral Daily Massengill, Nathan, MD   5 mg at 09/05/21 5284   feeding supplement (ENSURE ENLIVE / ENSURE PLUS) liquid 237 mL  237 mL Oral BID BM Attiah, Nadir, MD   237 mL at 09/05/21 0832   OLANZapine zydis (ZYPREXA) disintegrating tablet 5 mg  5 mg Oral Q8H PRN Massengill, Harrold Donath, MD       And   LORazepam (ATIVAN) tablet 1 mg  1 mg Oral Q6H PRN Massengill, Nathan, MD       And   ziprasidone (GEODON) injection 20 mg  20 mg Intramuscular Q6H PRN Massengill, Nathan, MD       magnesium hydroxide (MILK OF MAGNESIA) suspension 30 mL  30 mL Oral Daily PRN Leevy-Johnson, Brooke A, NP       OLANZapine zydis (ZYPREXA) disintegrating tablet 5 mg  5 mg Oral BID Ntuen, Tina C, FNP   5 mg at 09/05/21 1324   PTA Medications: Medications Prior to Admission  Medication Sig Dispense Refill Last Dose   amphetamine-dextroamphetamine (ADDERALL) 5 MG tablet Take 1 tablet by mouth 2 (two) times daily.   Past Week   benztropine (COGENTIN) 1 MG tablet Take 1 tablet (1 mg total) by mouth at bedtime. 30 tablet 0     Patient Stressors: Financial difficulties   Marital or family conflict   Medication change or noncompliance    Patient Strengths: General fund of knowledge   Treatment Modalities: Medication Management, Group therapy, Case management,   1 to 1 session with clinician, Psychoeducation, Recreational therapy.   Physician Treatment Plan for Primary Diagnosis: Schizoaffective disorder, bipolar type (HCC) Long Term Goal(s): Improvement in symptoms so as ready for discharge   Short Term Goals: Ability to identify changes in lifestyle to reduce recurrence of condition will improve Ability to verbalize feelings will improve Ability to disclose and discuss suicidal ideas Ability to demonstrate self-control will improve Ability to identify and develop effective coping behaviors will improve Ability to maintain clinical measurements within normal limits will improve Compliance with prescribed medications will improve Ability to identify triggers associated with substance abuse/mental health issues will improve  Medication Management: Evaluate patient's response, side effects, and tolerance of medication regimen.  Therapeutic Interventions: 1 to 1 sessions, Unit Group sessions and Medication administration.  Evaluation of Outcomes: Progressing  Physician Treatment Plan for Secondary Diagnosis: Principal Problem:   Schizoaffective disorder, bipolar type (HCC)  Long Term Goal(s): Improvement in symptoms so as ready for discharge   Short Term Goals: Ability to identify changes in lifestyle to reduce recurrence of condition will improve Ability to verbalize feelings will improve Ability to disclose and discuss suicidal ideas Ability to demonstrate self-control will improve Ability to identify and develop effective coping behaviors will improve Ability to maintain clinical measurements within normal limits will improve Compliance with prescribed medications will improve Ability to identify triggers associated with  substance abuse/mental health issues will improve     Medication Management: Evaluate patient's response, side effects, and tolerance of medication regimen.  Therapeutic Interventions: 1 to 1 sessions, Unit Group  sessions and Medication administration.  Evaluation of Outcomes: Progressing   RN Treatment Plan for Primary Diagnosis: Schizoaffective disorder, bipolar type (HCC) Long Term Goal(s): Knowledge of disease and therapeutic regimen to maintain health will improve  Short Term Goals: Ability to remain free from injury will improve, Ability to verbalize frustration and anger appropriately will improve, Ability to demonstrate self-control, Ability to participate in decision making will improve, Ability to verbalize feelings will improve, Ability to disclose and discuss suicidal ideas, Ability to identify and develop effective coping behaviors will improve, and Compliance with prescribed medications will improve  Medication Management: RN will administer medications as ordered by provider, will assess and evaluate patient's response and provide education to patient for prescribed medication. RN will report any adverse and/or side effects to prescribing provider.  Therapeutic Interventions: 1 on 1 counseling sessions, Psychoeducation, Medication administration, Evaluate responses to treatment, Monitor vital signs and CBGs as ordered, Perform/monitor CIWA, COWS, AIMS and Fall Risk screenings as ordered, Perform wound care treatments as ordered.  Evaluation of Outcomes: Progressing   LCSW Treatment Plan for Primary Diagnosis: Schizoaffective disorder, bipolar type (HCC) Long Term Goal(s): Safe transition to appropriate next level of care at discharge, Engage patient in therapeutic group addressing interpersonal concerns.  Short Term Goals: Engage patient in aftercare planning with referrals and resources, Increase social support, Increase ability to appropriately verbalize feelings, Increase emotional regulation, Facilitate acceptance of mental health diagnosis and concerns, Facilitate patient progression through stages of change regarding substance use diagnoses and concerns, Identify triggers associated  with mental health/substance abuse issues, and Increase skills for wellness and recovery  Therapeutic Interventions: Assess for all discharge needs, 1 to 1 time with Social worker, Explore available resources and support systems, Assess for adequacy in community support network, Educate family and significant other(s) on suicide prevention, Complete Psychosocial Assessment, Interpersonal group therapy.  Evaluation of Outcomes: Progressing   Progress in Treatment: Attending groups: No. Participating in groups: No. Taking medication as prescribed: Yes. Toleration medication: Yes. Family/Significant other contact made: No, will contact:  CSW will obtain consent to reach family/friend.  Patient understands diagnosis: Yes. Discussing patient identified problems/goals with staff: Yes. Medical problems stabilized or resolved: Yes. Denies suicidal/homicidal ideation: No. Issues/concerns per patient self-inventory: Yes. Other: none  New problem(s) identified: No, Describe:  none  New Short Term/Long Term Goal(s): Patient to work towards detox, elimination of symptoms of psychosis, medication management for mood stabilization; elimination of SI thoughts; development of comprehensive mental wellness/sobriety plan.  Patient Goals:  Patient states their goal for treatment is to "get my strength back . . . Attention and concentration."  Discharge Plan or Barriers: No psychosocial barriers identified at this time, patient to return to place of residence when appropriate for discharge.   Reason for Continuation of Hospitalization: Depression Other; describe psychotic features   Estimated Length of Stay:1-7 days   Scribe for Treatment Team: Almedia Balls 09/05/2021 12:19 PM

## 2021-09-05 NOTE — Group Note (Signed)
LCSW Group Therapy Note   Group Date: 09/05/2021 Start Time: 1300 End Time: 1400  Type of Therapy/Topic:  Group Therapy:  Balance in Life  Participation Level:  Did Not Attend  Description of Group:    This group will address the concept of balance and how it feels and looks when one is unbalanced. Patients will be encouraged to process areas in their lives that are out of balance and identify reasons for remaining unbalanced. Facilitators will guide patients in utilizing problem-solving interventions to address and correct the stressor making their life unbalanced. Understanding and applying boundaries will be explored and addressed for obtaining and maintaining a balanced life. Patients will be encouraged to explore ways to assertively make their unbalanced needs known to significant others in their lives, using other group members and facilitator for support and feedback.  Therapeutic Goals: Patient will identify two or more emotions or situations they have that consume much of in their lives. Patient will identify two ways to set boundaries in order to achieve balance in their lives:   Summary of Patient Progress:   Did not attend     Therapeutic Modalities:   Cognitive Behavioral Therapy Solution-Focused Therapy Assertiveness Training  Aram Beecham, [place] 09/05/2021  1:30 PM

## 2021-09-05 NOTE — Progress Notes (Signed)
Patient did not attend morning orientation and goal setting group.  

## 2021-09-05 NOTE — [facility] Group Notes (Signed)
Scales 1-10 7-10 Goal: work on staying out his room and getting his energy up.

## 2021-09-05 NOTE — Progress Notes (Addendum)
[facility] MD Progress Note  09/05/2021 2:07 PM Wesley Peterson  MRN:  [id number]  Subjective:    Wesley Peterson is a 36 y.o [place] American male with a history of Schizophrenia who presented Involuntarily to the [facility] for treatment and stabilization of his mood, from [facility] with complaints of worsening auditory hallucinations, passive SI, HI, & depressive symptoms in the context of medication non compliance and homelessness.  Urine drug screen positive for methamphetamines.  Yesterday the psychiatry team made the following recommendations: -Start Zyprexa 5 mg po BID  Per nursing report, patient is paranoid and having auditory hallucinations.  On my exam today, the patient is psychotic.  He is paranoid.  He reports having auditory hallucinations but cannot describe qualities of the hallucinations such as number of voices, origin of the voices from inside or outside of the head.  He does report having command auditory hallucinations but when asked what the voices tell him to do the patient states "I do not know. shit.  Damn".  Denies VH.  Patient denies having SI today.  Patient was asked multiple times about HI, that he reported and that is documented in the chart on admission.  The patient does not recall having any homicidal thoughts towards anyone or his brother.  He also does not recall any events or symptoms that he was having, leading up to this admission.  At this time he denies having any HI.  He reports he is sleeping a lot and feels tired throughout the day.  He reports medication is causing him to have daytime fatigue.  Reports low energy.  He reports that his mood is anxious.  He reports that anxiety level is high.  Reports appetite is okay.  Concentration is poor.  His thoughts are disorganized.  He is agreeable to consolidating Zyprexa to nightly dosing only.     Principal Problem: Schizoaffective disorder, bipolar type (HCC) Diagnosis: Principal Problem:   Schizoaffective  disorder, bipolar type (HCC)  Total Time spent with patient: 15 minutes  Past Psychiatric History:  Schizophrenia Previous psychiatric hospitalizations Nonadherence with home psychiatric medication regimen Past psychiatric medication history: Risperdal, Cogentin, Zyprexa (reported, list is probably more extensive than this)   Past Medical History:  Past Medical History:  Diagnosis Date   Schizophrenia (HCC)    Schizophrenia, paranoid type (HCC) 02/02/2021   History reviewed. No pertinent surgical history. Family History: History reviewed. No pertinent family history.  Family Psychiatric  History: None reported per H&P  Social History:  Social History   Substance and Sexual Activity  Alcohol Use Not Currently   Alcohol/week: 1.0 standard drink of alcohol   Types: 1 Cans of beer per week   Comment: occasional     Social History   Substance and Sexual Activity  Drug Use Never    Social History   Socioeconomic History   Marital status: Single    Spouse name: Not on file   Number of children: 1   Years of education: Not on file   Highest education level: Not on file  Occupational History   Occupation: Unemployed  Tobacco Use   Smoking status: Never   Smokeless tobacco: Never  Vaping Use   Vaping Use: Never used  Substance and Sexual Activity   Alcohol use: Not Currently    Alcohol/week: 1.0 standard drink of alcohol    Types: 1 Cans of beer per week    Comment: occasional   Drug use: Never   Sexual activity: Never  Other Topics Concern   Not on file  Social History Narrative   ** Merged History Encounter **       Pt lives with mother and other relatives.  Currently unemployed.  Receives outpatient psychiatry services through [facility].   Social Determinants of Health   Financial Resource Strain: Not on file  Food Insecurity: Not on file  Transportation Needs: Not on file  Physical Activity: Not on file  Stress: Not on file  Social Connections: Not on  file   Additional Social History:                         Sleep: Good  Appetite:  Fair  Current Medications: Current Facility-Administered Medications  Medication Dose Route Frequency Provider Last Rate Last Admin   acetaminophen (TYLENOL) tablet 650 mg  650 mg Oral Q6H PRN Leevy-Johnson, Brooke A, NP       alum & mag hydroxide-simeth (MAALOX/MYLANTA) 200-200-20 MG/5ML suspension 30 mL  30 mL Oral Q4H PRN Leevy-Johnson, Brooke A, NP       amLODipine (NORVASC) tablet 5 mg  5 mg Oral Daily Tametra Ahart, MD   5 mg at 09/05/21 2202   feeding supplement (ENSURE ENLIVE / ENSURE PLUS) liquid 237 mL  237 mL Oral BID BM Attiah, Nadir, MD   237 mL at 09/05/21 5427   hydrOXYzine (ATARAX) tablet 25 mg  25 mg Oral TID PRN Evadna Donaghy, Harrold Donath, MD       OLANZapine zydis (ZYPREXA) disintegrating tablet 5 mg  5 mg Oral Q8H PRN Kartik Fernando, MD       And   LORazepam (ATIVAN) tablet 1 mg  1 mg Oral Q6H PRN Ellen Goris, MD       And   ziprasidone (GEODON) injection 20 mg  20 mg Intramuscular Q6H PRN Rubyann Lingle, MD       magnesium hydroxide (MILK OF MAGNESIA) suspension 30 mL  30 mL Oral Daily PRN Leevy-Johnson, Brooke A, NP       [START ON 09/06/2021] OLANZapine zydis (ZYPREXA) disintegrating tablet 10 mg  10 mg Oral QHS Lavoy Bernards, MD       traZODone (DESYREL) tablet 50 mg  50 mg Oral QHS PRN Andrena Margerum, MD        Lab Results: No results found for this or any previous visit (from the past 48 hour(s)).  Blood Alcohol level:  Lab Results  Component Value Date   ETH <10 08/31/2021   ETH <10 07/21/2021    Metabolic Disorder Labs: Lab Results  Component Value Date   HGBA1C 5.9 (H) 06/02/2021   MPG 122.63 06/02/2021   MPG 126 02/03/2021   Lab Results  Component Value Date   PROLACTIN 17.5 (H) 06/02/2021   PROLACTIN 23.4 (H) 11/28/2018   Lab Results  Component Value Date   CHOL 137 06/02/2021   TRIG 105 06/02/2021   HDL 36 (L) 06/02/2021    CHOLHDL 3.8 06/02/2021   VLDL 21 06/02/2021   LDLCALC 80 06/02/2021   LDLCALC 80 01/30/2021    Physical Findings: AIMS: Facial and Oral Movements Muscles of Facial Expression: None, normal Lips and Perioral Area: None, normal Jaw: None, normal Tongue: None, normal,Extremity Movements Upper (arms, wrists, hands, fingers): None, normal Lower (legs, knees, ankles, toes): None, normal, Trunk Movements Neck, shoulders, hips: None, normal, Overall Severity Severity of abnormal movements (highest score from questions above): None, normal Incapacitation due to abnormal movements: None, normal Patient's awareness of abnormal movements (rate only  patient's report): No Awareness, Dental Status Current problems with teeth and/or dentures?: No Does patient usually wear dentures?: No  CIWA:    COWS:     Musculoskeletal: Strength & Muscle Tone: within normal limits Gait & Station: normal Patient leans: N/A  Psychiatric Specialty Exam:  Presentation  General Appearance: Disheveled  Eye Contact:Poor  Speech:Normal Rate  Speech Volume:Decreased  Handedness:Right   Mood and Affect  Mood:Anxious  Affect:Flat   Thought Process  Thought Processes:Disorganized  Descriptions of Associations:Tangential  Orientation:Full (Time, Place and Person)  Thought Content:Tangential  History of Schizophrenia/Schizoaffective disorder:Yes  Duration of Psychotic Symptoms:Greater than six months  Hallucinations:Hallucinations: Auditory Description of Auditory Hallucinations: "Hearing voices, I think"  Ideas of Reference:Paranoia  Suicidal Thoughts:Suicidal Thoughts: No SI Active Intent and/or Plan: -- (n/a)  Homicidal Thoughts:Homicidal Thoughts: No   Sensorium  Memory:Immediate Good; Recent Good; Remote Good  Judgment:Impaired  Insight:Shallow   Executive Functions  Concentration:Poor  Attention Span:Poor  Recall:Poor  Fund of  Knowledge:Poor  Language:Fair   Psychomotor Activity  Psychomotor Activity:Psychomotor Activity: Normal   Assets  Assets:Physical Health; Communication Skills   Sleep  Sleep:Sleep: Good Number of Hours of Sleep: 6    Physical Exam: Physical Exam Vitals reviewed.  Constitutional:      General: He is not in acute distress.    Appearance: He is normal weight. He is not toxic-appearing.  Pulmonary:     Effort: Pulmonary effort is normal. No respiratory distress.  Neurological:     Mental Status: He is alert.     Motor: No weakness.     Gait: Gait normal.    Review of Systems  Constitutional:  Negative for chills and fever.  Cardiovascular:  Negative for chest pain and palpitations.  Neurological:  Negative for dizziness, tingling, tremors and headaches.  Psychiatric/Behavioral:  Positive for hallucinations, memory loss and substance abuse. Negative for depression and suicidal ideas. The patient is nervous/anxious. The patient does not have insomnia.    Blood pressure (!) 147/102, pulse 75, temperature 98.5 F (36.9 C), temperature source Oral, resp. rate 18, height 6\' 1"  (1.854 m), weight 66.2 kg, SpO2 100 %. Body mass index is 19.26 kg/m.   Treatment Plan Summary: Daily contact with patient to assess and evaluate symptoms and progress in treatment  Assessment: Schizoaffective disorder, bipolar type Amphetamine use disorder Medication nonadherence   PLAN Safety and Monitoring: Involuntary admission to inpatient psychiatric unit for safety, stabilization and treatment Daily contact with patient to assess and evaluate symptoms and progress in treatment Patient's case to be discussed in multi-disciplinary team meeting Observation Level : q15 minute checks Vital signs: q12 hours Precautions: Safety, No Roommate due to paranoia  2. Medication plan: -Change Zyprexa from 5 mg twice daily to 10 mg nightly-risk of affective disorder bipolar type.  Changing dose  schedule due to daytime sedation.  During previous admission, patient only required nightly dosing. -Continue other as needed medications such as trazodone, hydroxyzine, and agitation protocol  -Previously discontinued Geodon 40 mg twice daily with meals  Other medical issues being addressed: -Repeat prolactin level -Repeat TSH  HTN - start norvasc 5 mg once daily   Other PRNS -Continue Tylenol 650 mg every 6 hours PRN for mild pain -Continue Maalox 30 mg every 4 hrs PRN for indigestion -Continue Milk of Magnesia as needed every 6 hrs for constipation   Discharge Planning: Social work and case management to assist with discharge planning and identification of hospital follow-up needs prior to discharge Estimated LOS: 5-7 days Discharge  Concerns: Need to establish a safety plan; Medication compliance and effectiveness Discharge Goals: Return home with outpatient referrals for mental health follow-up including medication management/psychotherapy      Cristy Hilts, MD 09/05/2021, 2:07 PM   Total Time Spent in Direct Patient Care:  I personally spent 35 minutes on the unit in direct patient care. The direct patient care time included face-to-face time with the patient, reviewing the patient's chart, communicating with other professionals, and coordinating care. Greater than 50% of this time was spent in counseling or coordinating care with the patient regarding goals of hospitalization, psycho-education, and discharge planning needs.   Phineas Inches, MD Psychiatrist
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
## 2021-09-05 NOTE — Progress Notes (Signed)
    09/05/21 2200  Psych Admission Type (Psych Patients Only)  Admission Status Involuntary  Psychosocial Assessment  Patient Complaints Decreased concentration  Eye Contact Avoids  Facial Expression Flat  Affect Flat  Speech Slow  Interaction Avoidant;No initiation;Minimal  Motor Activity Other (Comment) (Unremarkable)  Appearance/Hygiene Unremarkable  Behavior Characteristics Guarded  Mood Suspicious;Pleasant  Thought Process  Coherency Blocking  Content Preoccupation  Delusions Paranoid  Perception Hallucinations  Hallucination Auditory  Judgment Poor  Confusion WDL  Danger to Self  Current suicidal ideation? Denies  Danger to Others  Danger to Others None reported or observed

## 2021-09-05 NOTE — Progress Notes (Signed)
   09/04/21 2100  Psych Admission Type (Psych Patients Only)  Admission Status Involuntary  Psychosocial Assessment  Patient Complaints Decreased concentration;Anxiety;Isolation  Eye Contact Avoids  Facial Expression Flat  Affect Depressed  Speech Logical/coherent  Interaction Forwards little;Guarded  Motor Activity Slow  Appearance/Hygiene Unremarkable  Behavior Characteristics Guarded  Mood Depressed  Thought Process  Coherency Blocking  Content Preoccupation  Delusions Paranoid  Perception Hallucinations  Hallucination Auditory;Visual  Judgment Poor  Confusion None  Danger to Self  Current suicidal ideation? Denies  Danger to Others  Danger to Others None reported or observed

## 2021-09-06 DIAGNOSIS — F1510 Other stimulant abuse, uncomplicated: Secondary | ICD-10-CM | POA: Diagnosis not present

## 2021-09-06 DIAGNOSIS — Z91148 Patient's other noncompliance with medication regimen for other reason: Secondary | ICD-10-CM | POA: Diagnosis not present

## 2021-09-06 DIAGNOSIS — F209 Schizophrenia, unspecified: Secondary | ICD-10-CM

## 2021-09-06 DIAGNOSIS — I10 Essential (primary) hypertension: Secondary | ICD-10-CM | POA: Diagnosis not present

## 2021-09-06 LAB — TSH: TSH: 2.661 u[IU]/mL (ref 0.350–4.500)

## 2021-09-06 MED ORDER — NAPROXEN 500 MG PO TABS: 500.0000 mg | ORAL_TABLET | Freq: Two times a day (BID) | ORAL | Status: AC | PRN

## 2021-09-06 MED ORDER — DICYCLOMINE HCL 20 MG PO TABS
20.0000 mg | ORAL_TABLET | Freq: Four times a day (QID) | ORAL | Status: AC | PRN
Start: 2021-09-06 — End: 2021-09-11

## 2021-09-06 MED ORDER — SENNOSIDES-DOCUSATE SODIUM 8.6-50 MG PO TABS
1.0000 | ORAL_TABLET | Freq: Two times a day (BID) | ORAL | Status: DC
  Administered 2021-09-06: 1 via ORAL
  Filled 2021-09-06 (×6): qty 1

## 2021-09-06 MED ORDER — METHOCARBAMOL 500 MG PO TABS
500.0000 mg | ORAL_TABLET | Freq: Three times a day (TID) | ORAL | Status: AC | PRN
Start: 2021-09-06 — End: 2021-09-11

## 2021-09-06 MED ORDER — PANTOPRAZOLE SODIUM 40 MG PO TBEC
40.0000 mg | DELAYED_RELEASE_TABLET | Freq: Every day | ORAL | Status: DC
  Administered 2021-09-06 – 2021-09-15 (×7): 40 mg via ORAL
  Filled 2021-09-06 (×3): qty 1
  Filled 2021-09-06: qty 7
  Filled 2021-09-06 (×6): qty 1
  Filled 2021-09-06: qty 7
  Filled 2021-09-06 (×3): qty 1

## 2021-09-06 MED ORDER — LOPERAMIDE HCL 2 MG PO CAPS: 2.0000 mg | ORAL_CAPSULE | ORAL | Status: AC | PRN

## 2021-09-06 MED ORDER — HYDROXYZINE HCL 25 MG PO TABS
25.0000 mg | ORAL_TABLET | Freq: Four times a day (QID) | ORAL | Status: AC | PRN
  Administered 2021-09-08 – 2021-09-09 (×2): 25 mg via ORAL
  Filled 2021-09-06 (×3): qty 1

## 2021-09-06 MED ORDER — BENZTROPINE MESYLATE 0.5 MG PO TABS
0.5000 mg | ORAL_TABLET | Freq: Two times a day (BID) | ORAL | Status: DC | PRN
  Filled 2021-09-06 (×2): qty 14

## 2021-09-06 MED ORDER — VALBENAZINE TOSYLATE 40 MG PO CAPS
40.0000 mg | ORAL_CAPSULE | Freq: Every day | ORAL | Status: DC
  Administered 2021-09-06 – 2021-09-15 (×10): 40 mg via ORAL
  Filled 2021-09-06 (×7): qty 1
  Filled 2021-09-06: qty 7
  Filled 2021-09-06 (×3): qty 1
  Filled 2021-09-06: qty 7
  Filled 2021-09-06: qty 1

## 2021-09-06 MED ORDER — ONDANSETRON 4 MG PO TBDP: 4.0000 mg | ORAL_TABLET | Freq: Four times a day (QID) | ORAL | Status: AC | PRN

## 2021-09-06 NOTE — Progress Notes (Addendum)
[facility] MD Progress Note  09/06/2021 8:49 AM Wesley Peterson  MRN:  [id number]  Chief Complaint: psychosis and SI  Reason for Admission:  Wesley Peterson is a 36 y.o [place] American male with a history of Schizophrenia who presented under IVC to the [facility] for treatment and stabilization of his mood, from [facility] ER with complaints of worsening auditory hallucinations, passive SI, HI, & depressive symptoms in the context of medication non compliance and homelessness.  Urine drug screen positive for amphetamines.The patient is currently on Hospital Day 3.   Chart Review from last 24 hours:  The patient's chart was reviewed and nursing notes were reviewed. The patient's case was discussed in multidisciplinary team meeting. Per nursing, he appeared to be responding to internal stimuli and had trouble sleeping last night. He was isolative to his room on evening shift and attended evening wrap up group yesterday.  Per [facility] he was compliant with scheduled medications and did receive Vistaril X1 and Trazodone X1 for sleep PRN.  Information Obtained Today During Patient Interview: The patient was seen and evaluated on the unit. On assessment today the patient hands me a piece of paper that he has written down how he is feeling. The paper states he feels hopeless due to voices in his head that are "predicting things before I say it." He describes feeling that the voices are "locked inside of me" and that the voices are "reading my brain." He describes belief that the voices are "taking advantage of me when I am by myself" and that they "stole the sides of my penis." When confronted about these thoughts, he states the voices are not ones he recognizes and he does have belief in thought insertion/withdrawal by unknown persons but denies belief in thought broadcasting. He states he does not know who "stole the sides" of his penis when questioned further about this delusion. He denies SI, HI, VH, or ideas of  reference. He states his mood is "alright" but he is worried about his housing after discharge. He describes having issues with GERD and constipation and states he "does not know where the food goes when he eats" because he is not having a BM. He reports fair appetite and fair sleep. He states he feels tired during the day. He voices no other physical complaints today. When questioned about the lateral jaw movements noted on exam, he states they are not bothersome to him but he is willing to restart Ingrezza since he had been on this before. He denies other signs of TD/EPS. When questioned about the amphetamines in his UDS, he states he takes prescribed Adderall with last dose on 8/23. He denies illicit drug use.  Principal Problem: Schizophrenia (HCC) Diagnosis: Principal Problem:   Schizophrenia (HCC)  Total Time Spent in Direct Patient Care:  I personally spent 30 minutes on the unit in direct patient care. The direct patient care time included face-to-face time with the patient, reviewing the patient's chart, communicating with other professionals, and coordinating care. Greater than 50% of this time was spent in counseling or coordinating care with the patient regarding goals of hospitalization, psycho-education, and discharge planning needs.  Past Psychiatric History: see H&P  Past Medical History:  Past Medical History:  Diagnosis Date   Schizophrenia (HCC)    Schizophrenia, paranoid type (HCC) 02/02/2021   Family History: see H&P  Family Psychiatric  History: see H&P  Social History:  Social History   Substance and Sexual Activity  Alcohol Use Not Currently  Alcohol/week: 1.0 standard drink of alcohol   Types: 1 Cans of beer per week   Comment: occasional     Social History   Substance and Sexual Activity  Drug Use Never    Social History   Socioeconomic History   Marital status: Single    Spouse name: Not on file   Number of children: 1   Years of education: Not on  file   Highest education level: Not on file  Occupational History   Occupation: Unemployed  Tobacco Use   Smoking status: Never   Smokeless tobacco: Never  Vaping Use   Vaping Use: Never used  Substance and Sexual Activity   Alcohol use: Not Currently    Alcohol/week: 1.0 standard drink of alcohol    Types: 1 Cans of beer per week    Comment: occasional   Drug use: Never   Sexual activity: Never  Other Topics Concern   Not on file  Social History Narrative   ** Merged History Encounter **       Pt lives with mother and other relatives.  Currently unemployed.  Receives outpatient psychiatry services through [facility].   Social Determinants of Health   Financial Resource Strain: Not on file  Food Insecurity: Not on file  Transportation Needs: Not on file  Physical Activity: Not on file  Stress: Not on file  Social Connections: Not on file    Sleep: 6 hours  Appetite:  Fair  Current Medications: Current Facility-Administered Medications  Medication Dose Route Frequency Provider Last Rate Last Admin   acetaminophen (TYLENOL) tablet 650 mg  650 mg Oral Q6H PRN Leevy-Johnson, Brooke A, NP       alum & mag hydroxide-simeth (MAALOX/MYLANTA) 200-200-20 MG/5ML suspension 30 mL  30 mL Oral Q4H PRN Leevy-Johnson, Brooke A, NP       amLODipine (NORVASC) tablet 5 mg  5 mg Oral Daily Massengill, Nathan, MD   5 mg at 09/06/21 0818   feeding supplement (ENSURE ENLIVE / ENSURE PLUS) liquid 237 mL  237 mL Oral BID BM Attiah, Nadir, MD   237 mL at 09/06/21 0817   hydrOXYzine (ATARAX) tablet 25 mg  25 mg Oral TID PRN Phineas Inches, MD   25 mg at 09/06/21 0121   OLANZapine zydis (ZYPREXA) disintegrating tablet 5 mg  5 mg Oral Q8H PRN Massengill, Harrold Donath, MD       And   LORazepam (ATIVAN) tablet 1 mg  1 mg Oral Q6H PRN Massengill, Harrold Donath, MD       And   ziprasidone (GEODON) injection 20 mg  20 mg Intramuscular Q6H PRN Massengill, Nathan, MD       magnesium hydroxide (MILK OF MAGNESIA)  suspension 30 mL  30 mL Oral Daily PRN Leevy-Johnson, Brooke A, NP       OLANZapine zydis (ZYPREXA) disintegrating tablet 10 mg  10 mg Oral QHS Massengill, Nathan, MD       traZODone (DESYREL) tablet 50 mg  50 mg Oral QHS PRN Massengill, Harrold Donath, MD   50 mg at 09/06/21 0121    Lab Results: No results found for this or any previous visit (from the past 48 hour(s)).  Blood Alcohol level:  Lab Results  Component Value Date   [facility] <10 08/31/2021   ETH <10 07/21/2021    Metabolic Disorder Labs: Lab Results  Component Value Date   HGBA1C 5.9 (H) 06/02/2021   MPG 122.63 06/02/2021   MPG 126 02/03/2021   Lab Results  Component Value Date  PROLACTIN 17.5 (H) 06/02/2021   PROLACTIN 23.4 (H) 11/28/2018   Lab Results  Component Value Date   CHOL 137 06/02/2021   TRIG 105 06/02/2021   HDL 36 (L) 06/02/2021   CHOLHDL 3.8 06/02/2021   VLDL 21 06/02/2021   LDLCALC 80 06/02/2021   LDLCALC 80 01/30/2021    Physical Findings:  Musculoskeletal: Strength & Muscle Tone: within normal limits Gait & Station: normal Patient leans: N/A  Psychiatric Specialty Exam:  Presentation  General Appearance: Casually dressed, fair hygiene  Eye Contact:Minimal  Speech:Normal rate and fluency  Speech Volume:Decreased  Mood and Affect  Mood:Aloof, anxious  Affect:restricted   Thought Process  Thought Processes:Linear   Orientation:Oriented to city, month and year  Thought Content: Has delusion that someone has stolen the sides of his penis, has belief in thought insertion/withdrawal and report of AH. Denies VH, thought broadcasting or ideas of reference. Denies SI or HI  History of Schizophrenia/Schizoaffective disorder:Yes  Duration of Psychotic Symptoms:Greater than six months  Hallucinations:Auditory  Ideas of Reference:Denied  Suicidal Thoughts:Suicidal Thoughts: No  Homicidal Thoughts:Homicidal Thoughts: No   Sensorium   Memory:Fair  Judgment:Impaired  Insight:Shallow   Executive Functions  Concentration:Fair  Attention Span:Fair  Recall:Fair  Fund of Knowledge:Fair  Language:Fair   Psychomotor Activity  Psychomotor Activity:Lateral lower jaw movements with distraction and when not speaking; no stiffness, tremor, cogwheeling, or involuntary movements of extremities or trunk, no lip smacking or grimacing, no tongue movements  Assets  Assets:Physical Health; Communication Skills  Physical Exam Vitals and nursing note reviewed.  Constitutional:      Appearance: Normal appearance.  Pulmonary:     Effort: Pulmonary effort is normal. No respiratory distress.  Neurological:     Mental Status: He is alert.    Review of Systems  Respiratory:  Negative for shortness of breath.   Cardiovascular:  Negative for chest pain.  Gastrointestinal:  Positive for constipation and heartburn. Negative for diarrhea, nausea and vomiting.  Neurological:  Negative for headaches.   Blood pressure 137/84, pulse 85, temperature 98.4 F (36.9 C), temperature source Oral, resp. rate 18, height 6\' 1"  (1.854 m), weight 66.2 kg, SpO2 96 %. Body mass index is 19.26 kg/m.   Treatment Plan Summary: Daily contact with patient to assess and evaluate symptoms and progress in treatment  Assessment: Schizophrenia by hx Medication nonadherence  PLAN: Safety and Monitoring:  -- Involuntary admission to inpatient psychiatric unit for safety, stabilization and treatment  -- Daily contact with patient to assess and evaluate symptoms and progress in treatment  -- Patient's case to be discussed in multi-disciplinary team meeting  -- Observation Level : q15 minute checks  -- Vital signs:  q12 hours  -- Precautions: suicide, elopement, and assault  2. Psychiatric Diagnoses and Treatment:   Schizophrenia by hx  -- Continue Zyprexa 10mg  qhs - may need further dose titration over coming days  -- Metabolic profile and EKG  monitoring obtained while on an atypical antipsychotic (Lipid Panel: WNL other than HDL 36 on 5/25; HbgA1c: 5.9 on 5/25; QTc:438ms)   -- Encouraged patient to participate in unit milieu and in scheduled group therapies     UDS positive for amphetamines  -- PDMP reviewed and patient received Adderall 5mg  bid #60 tabs 08/29/21   -- PRNS available for possible withdrawal   -- will encouraged not to restart given psychosis history     3. Medical Issues Being Addressed:   HTN  -- Continue Norvasc 5mg  daily   Other medical issues being  addressed: - prolactin level pending - TSH pending  Tardive Dyskinesia -- Previously on Ingrezza - will restart 40mg  daily for lateral jaw movement and observe  EKG (cannot r/o age undetermined inferior infarct)  -- Repeating EKG to see if related to lead placement; patient currently asymptomatic  Constipation -- Start senokot bid  GERD -- Protonix 40mg  daily   4. Discharge Planning:   -- Social work and case management to assist with discharge planning and identification of hospital follow-up needs prior to discharge  -- Discharge Concerns: Need to establish a safety plan; Medication compliance and effectiveness  -- Discharge Goals: Return home with outpatient referrals for mental health follow-up including medication management/psychotherapy  , MD, FAPA 09/06/2021, 8:49 AM

## 2021-09-06 NOTE — BHH Counselor (Signed)
CSW provided the Pt with a packet that contains information including shelter and housing resources, free and reduced price food information, clothing resources, crisis center information, a GoodRX card, and suicide prevention information.   

## 2021-09-06 NOTE — Group Note (Signed)
Recreation Therapy Group Note   Group Topic:Coping Skills  Group Date: 09/06/2021 Start Time: 1005 End Time: 1045 Facilitators: Caroll Rancher, LRT,CTRS Location: [address] Dayroom   Goal Area(s) Addresses:  Patient will identify positive coping skill techniques. Patient will identify benefits of using positive coping skills post d/c.   Group Description: Mind Map.  Patient was provided a blank template of a diagram with 32 blank boxes in a tiered system, branching from the center (similar to a bubble chart). LRT directed patients to label the middle of the diagram "Coping Skills" and consider 8 different sources in which coping skills would be needed.  Patients and LRT filled in the first 8 boxes together with 8 sources coping skills could be used (addiction, negativity, environment, anxiety, relationships, trauma, emotions and values).  Patients were to then come up with 3 effective coping techniques to address each identified area in the remaining boxes stemming from a particular source. Pts were encouraged to share ideas with one another and ask for suggestions of peers and Clinical research associate when stuck on a certain category.   Affect/Mood: Appropriate   Participation Level: Engaged   Participation Quality: Independent   Behavior: Appropriate   Speech/Thought Process: Focused   Insight: Good   Judgement: Good   Modes of Intervention: Worksheet   Patient Response to Interventions:  Engaged   Education Outcome:  Acknowledges education and In group clarification offered    Clinical Observations/Individualized Feedback: Pt was appropriate and more open.  Pt wasn't as reserved.  Pt was engaged and offered the following coping skills: church, call someone, workout, take a walk, therapy, prayer, say what you feel and learn good manners.    Plan: Continue to engage patient in RT group sessions 2-3x/week.   Caroll Rancher, Antonietta Jewel  09/06/2021 12:35 PM

## 2021-09-06 NOTE — Progress Notes (Signed)
   09/06/21 0823  Psych Admission Type (Psych Patients Only)  Admission Status Involuntary  Psychosocial Assessment  Patient Complaints Decreased concentration  Eye Contact Avoids  Facial Expression Flat  Affect Flat  Speech Slow  Interaction Avoidant;Poor  Motor Activity Slow  Appearance/Hygiene Unremarkable  Behavior Characteristics Guarded  Mood Suspicious  Aggressive Behavior  Targets Other (Comment)  Type of Behavior  (No aggressive behavior)  Thought Process  Coherency Blocking  Content Preoccupation  Delusions Paranoid  Perception Hallucinations  Hallucination Auditory  Judgment Poor  Confusion WDL  Danger to Self  Current suicidal ideation? Denies  Danger to Others  Danger to Others None reported or observed

## 2021-09-06 NOTE — Progress Notes (Signed)
Recreation Therapy Notes  INPATIENT RECREATION THERAPY ASSESSMENT  Patient Details Name: Wesley Peterson MRN: [id number] DOB: June 21, 1985 Today's Date: 09/06/2021       Information Obtained From: Patient  Able to Participate in Assessment/Interview: Yes  Patient Presentation: Alert, Withdrawn  Reason for Admission (Per Patient): Other (Comments) (hadn't slept in days)  Patient Stressors:  (None identified)  Coping Skills:   Isolation, Sports, TV, Music, Exercise, Meditate, Write, Avoidance, Hot Bath/Shower (Writes music)  Leisure Interests (2+):  Music - Listen, Music - Write music, Sports - Basketball  Frequency of Recreation/Participation: Chief Executive Officer of Community Resources:  Yes  Community Resources:  Gym, Public affairs consultant, Research scientist (physical sciences)  Current Use: Yes  If no, Barriers?:    Expressed Interest in [employer] Information: No  [employer] of Residence:  Engineer, technical sales  Patient Main Form of Transportation: Therapist, music  Patient Strengths:  Creativity  Patient Identified Areas of Improvement:  "fatigue"  Patient Goal for Hospitalization:  "get my strength back up"  Current SI (including self-harm):  No  Current HI:  No  Current AVH: No  Staff Intervention Plan: Group Attendance, Collaborate with Interdisciplinary Treatment Team  Consent to Intern Participation: N/A   Caroll Rancher, Richardean Sale, Solmon Bohr A 09/06/2021, 12:52 PM

## 2021-09-06 NOTE — BHH Group Notes (Signed)
Adult Psychoeducational Group Note  Date:  09/06/2021 Time:  10:27 AM  Group Topic/Focus:  Goals Group:   The focus of this group is to help patients establish daily goals to achieve during treatment and discuss how the patient can incorporate goal setting into their daily lives to aide in recovery.  Participation Level:  Active  Participation Quality:  Attentive  Affect:  Appropriate  Cognitive:  Appropriate  Insight: Appropriate  Engagement in Group:  Engaged  Modes of Intervention:  Discussion  Additional Comments:   Patient attended goals group and was attentive the duration of it. Patient's goal was to take all of his medication.   Cristi Gwynn T Lorraine Lax 09/06/2021, 10:27 AM

## 2021-09-06 NOTE — Progress Notes (Signed)
Psychoeducational Group Note  Date:  09/06/2021 Time:  2038  Group Topic/Focus:  Wrap-Up Group:   The focus of this group is to help patients review their daily goal of treatment and discuss progress on daily workbooks.  Participation Level: Did Not Attend  Participation Quality:  Not Applicable  Affect:  Not Applicable  Cognitive:  Not Applicable  Insight:  Not Applicable  Engagement in Group: Not Applicable  Additional Comments:  The patient did not attend group this evening.   [name] 09/06/2021, 8:38 PM

## 2021-09-07 DIAGNOSIS — F209 Schizophrenia, unspecified: Secondary | ICD-10-CM | POA: Diagnosis not present

## 2021-09-07 LAB — PROLACTIN: Prolactin: 19.9 ng/mL — ABNORMAL HIGH (ref 4.0–15.2)

## 2021-09-07 NOTE — Progress Notes (Signed)
Psychoeducational Group Note  Date:  09/07/2021 Time:  2202  Group Topic/Focus:  Wrap-Up Group:   The focus of this group is to help patients review their daily goal of treatment and discuss progress on daily workbooks.  Participation Level: Did Not Attend  Participation Quality:  Not Applicable  Affect:  Not Applicable  Cognitive:  Not Applicable  Insight:  Not Applicable  Engagement in Group: Not Applicable  Additional Comments:  The patient did not attend group this evening.   Levora Werden S 09/07/2021, 10:02 PM

## 2021-09-07 NOTE — BHH Group Notes (Signed)
Patient did not attend morning orientation/goal setting group, although she was made aware of it.  

## 2021-09-07 NOTE — Group Note (Signed)
LCSW Group Therapy Note   Group Date: 09/07/2021 Start Time: 1300 End Time: 1400  Type of Therapy and Topic:  Group Therapy:  Healthy and Unhealthy Supports  Participation Level:  Did Not Attend   Description of Group:  Patients in this group were introduced to the idea of adding a variety of healthy supports to address the various needs in their lives, especially in reference to their plans and focus for the new year.  Patients discussed what additional healthy supports could be helpful in their recovery and wellness after discharge in order to prevent future hospitalizations.   An emphasis was placed on using counselor, doctor, therapy groups, 12-step groups, and problem-specific support groups to expand supports.    Therapeutic Goals:   1)  discuss importance of adding supports to stay well once out of the hospital  2)  compare healthy versus unhealthy supports and identify some examples of each  3)  generate ideas and descriptions of healthy supports that can be added  4)  offer mutual support about how to address unhealthy supports  5)  encourage active participation in and adherence to discharge plan    Summary of Patient Progress:  Did not attend    Therapeutic Modalities:   Motivational Interviewing Brief Solution-Focused Therapy  Travor Royce M Egan Berkheimer, LCSWA 09/07/2021  1:42 PM    

## 2021-09-07 NOTE — Progress Notes (Signed)
[facility] MD Progress Note  09/07/2021 7:09 AM Wesley Peterson  MRN:  [id number]  Chief Complaint: psychosis and SI  Reason for Admission:  Nicolaus Andel is a 36 y.o [place] American male with a history of Schizophrenia who presented under IVC to the [facility] for treatment and stabilization of his mood, from [facility] ER with complaints of worsening auditory hallucinations, passive SI, HI, & depressive symptoms in the context of medication non compliance and homelessness.  Urine drug screen positive for amphetamines.The patient is currently on Hospital Day 4.   Chart Review from last 24 hours:  The patient's chart was reviewed and nursing notes were reviewed. The patient's case was discussed in multidisciplinary team meeting. Per nursing, he attended RT and goals groups. He was isolative otherwise and guarded on the unit and reported AH to staff. He had no acute behavioral issues. Per [facility] he was compliant with scheduled medications and did receive Tylenol X1 yesterday during the day. He refused PRN sleep aid last night.  Information Obtained Today During Patient Interview: The patient was seen and evaluated on the unit. He states he is frustrated because he does not think medications are helping his residual AH. He will not discuss content of AH today but states they are not command in nature. He denies VH, ideas of reference or belief in thought broadcasting. He continues to endorse belief in thought insertion/withdrawal bu unknown persons and states he still believes unknown persons vs the voices have tampered with his penis. He admits to residual paranoia but will not discuss who or what has him suspicious. He denies SI or HI. He reports fair appetite and fair sleep. He continues to report feeling tired during the day and we discussed that we have moved his medications to qhs to help reduce daytime sedation. He states his constipation has resolved and he has no vomiting, nausea or GERD today. He voices  no physical complaints or medication side-effects. He states he does not notice any further jaw movements today and denies EPS/TD sx.  Principal Problem: Schizophrenia (HCC) Diagnosis: Principal Problem:   Schizophrenia (HCC)  Total Time Spent in Direct Patient Care:  I personally spent 30 minutes on the unit in direct patient care. The direct patient care time included face-to-face time with the patient, reviewing the patient's chart, communicating with other professionals, and coordinating care. Greater than 50% of this time was spent in counseling or coordinating care with the patient regarding goals of hospitalization, psycho-education, and discharge planning needs.  Past Psychiatric History: see H&P  Past Medical History:  Past Medical History:  Diagnosis Date   Schizophrenia (HCC)    Schizophrenia, paranoid type (HCC) 02/02/2021   Family History: see H&P  Family Psychiatric  History: see H&P  Social History:  Social History   Substance and Sexual Activity  Alcohol Use Not Currently   Alcohol/week: 1.0 standard drink of alcohol   Types: 1 Cans of beer per week   Comment: occasional     Social History   Substance and Sexual Activity  Drug Use Never    Social History   Socioeconomic History   Marital status: Single    Spouse name: Not on file   Number of children: 1   Years of education: Not on file   Highest education level: Not on file  Occupational History   Occupation: Unemployed  Tobacco Use   Smoking status: Never   Smokeless tobacco: Never  Vaping Use   Vaping Use: Never used  Substance and Sexual Activity   Alcohol use: Not Currently    Alcohol/week: 1.0 standard drink of alcohol    Types: 1 Cans of beer per week    Comment: occasional   Drug use: Never   Sexual activity: Never  Other Topics Concern   Not on file  Social History Narrative   ** Merged History Encounter **       Pt lives with mother and other relatives.  Currently unemployed.   Receives outpatient psychiatry services through [facility].   Social Determinants of Health   Financial Resource Strain: Not on file  Food Insecurity: Not on file  Transportation Needs: Not on file  Physical Activity: Not on file  Stress: Not on file  Social Connections: Not on file    Sleep: 7.5 hours  Appetite:  Fair  Current Medications: Current Facility-Administered Medications  Medication Dose Route Frequency Provider Last Rate Last Admin   acetaminophen (TYLENOL) tablet 650 mg  650 mg Oral Q6H PRN Leevy-Johnson, Brooke A, NP   650 mg at 09/06/21 1329   alum & mag hydroxide-simeth (MAALOX/MYLANTA) 200-200-20 MG/5ML suspension 30 mL  30 mL Oral Q4H PRN Leevy-Johnson, Brooke A, NP       amLODipine (NORVASC) tablet 5 mg  5 mg Oral Daily Massengill, Nathan, MD   5 mg at 09/06/21 0818   benztropine (COGENTIN) tablet 0.5 mg  0.5 mg Oral BID PRN Comer Locket, MD       dicyclomine (BENTYL) tablet 20 mg  20 mg Oral Q6H PRN Mason Jim, Jetty Berland E, MD       feeding supplement (ENSURE ENLIVE / ENSURE PLUS) liquid 237 mL  237 mL Oral BID BM Attiah, Nadir, MD   237 mL at 09/06/21 1317   hydrOXYzine (ATARAX) tablet 25 mg  25 mg Oral Q6H PRN Mason Jim, Teddi Badalamenti E, MD       loperamide (IMODIUM) capsule 2-4 mg  2-4 mg Oral PRN Mason Jim, Dedra Matsuo E, MD       OLANZapine zydis (ZYPREXA) disintegrating tablet 5 mg  5 mg Oral Q8H PRN Massengill, Nathan, MD       And   LORazepam (ATIVAN) tablet 1 mg  1 mg Oral Q6H PRN Massengill, Nathan, MD       And   ziprasidone (GEODON) injection 20 mg  20 mg Intramuscular Q6H PRN Massengill, Nathan, MD       magnesium hydroxide (MILK OF MAGNESIA) suspension 30 mL  30 mL Oral Daily PRN Leevy-Johnson, Brooke A, NP       methocarbamol (ROBAXIN) tablet 500 mg  500 mg Oral Q8H PRN Mason Jim, Alejandra Barna E, MD       naproxen (NAPROSYN) tablet 500 mg  500 mg Oral BID PRN Mason Jim, Kayl Stogdill E, MD       OLANZapine zydis (ZYPREXA) disintegrating tablet 10 mg  10 mg Oral QHS Massengill, Nathan, MD    10 mg at 09/06/21 2142   ondansetron (ZOFRAN-ODT) disintegrating tablet 4 mg  4 mg Oral Q6H PRN Comer Locket, MD       pantoprazole (PROTONIX) EC tablet 40 mg  40 mg Oral Daily Mason Jim, Caine Barfield E, MD   40 mg at 09/06/21 1317   senna-docusate (Senokot-S) tablet 1 tablet  1 tablet Oral BID Comer Locket, MD   1 tablet at 09/06/21 1847   traZODone (DESYREL) tablet 50 mg  50 mg Oral QHS PRN Massengill, Harrold Donath, MD   50 mg at 09/06/21 0121   valbenazine (INGREZZA) capsule 40 mg  40  mg Oral Daily Harlow Asa, MD   40 mg at 09/06/21 1316    Lab Results:  Results for orders placed or performed during the hospital encounter of 09/03/21 (from the past 48 hour(s))  TSH     Status: None   Collection Time: 09/06/21  6:41 AM  Result Value Ref Range   TSH 2.661 0.350 - 4.500 uIU/mL    Comment: Performed by a 3rd Generation assay with a functional sensitivity of <=0.01 uIU/mL. Performed at [facility], [place] [address], [place], [place] [address]     Blood Alcohol level:  Lab Results  Component Value Date   [facility] <10 08/31/2021   ETH <10 [id number]    Metabolic Disorder Labs: Lab Results  Component Value Date   HGBA1C 5.9 (H) 06/02/2021   MPG 122.63 06/02/2021   MPG 126 02/03/2021   Lab Results  Component Value Date   PROLACTIN 17.5 (H) 06/02/2021   PROLACTIN 23.4 (H) 11/28/2018   Lab Results  Component Value Date   CHOL 137 06/02/2021   TRIG 105 06/02/2021   HDL 36 (L) 06/02/2021   CHOLHDL 3.8 06/02/2021   VLDL 21 06/02/2021   LDLCALC 80 06/02/2021   LDLCALC 80 01/30/2021    Physical Findings:  Musculoskeletal: Strength & Muscle Tone: within normal limits Gait & Station: normal Patient leans: N/A  Psychiatric Specialty Exam:  Presentation  General Appearance: Casually dressed, fair hygiene  Eye Contact:Minimal  Speech:Normal rate and fluency  Speech Volume:Decreased  Mood and Affect  Mood:Aloof, anxious,  frustrated  Affect:restricted   Thought Process  Thought Processes:Linear but concrete  Orientation:Oriented to city, month and year and President  Thought Content: Has delusion that someone has tampered with his penis, has belief in thought insertion/withdrawal and report of AH. Denies VH, thought broadcasting or ideas of reference. Denies SI or HI; admits to paranoia and appears guarded on exam - is not grossly responding to internal/external stimuli on exam  History of Schizophrenia/Schizoaffective disorder:Yes  Duration of Psychotic Symptoms:Greater than six months  Hallucinations:Auditory -will not discuss content  Ideas of Reference:Denied  Suicidal Thoughts:Denied  Homicidal Thoughts:Denied   Sensorium  Memory:Fair  Judgment:Impaired  Insight:Shallow   Executive Functions  Concentration:Fair  Attention Span:Fair  [place]   Psychomotor Activity  Psychomotor Activity:Improved lateral lower jaw movements today - no other EPS or TD noted on exam  Assets  Assets:Physical Health; Communication Skills  Physical Exam Vitals and nursing note reviewed.  Constitutional:      Appearance: Normal appearance.  Pulmonary:     Effort: Pulmonary effort is normal. No respiratory distress.  Neurological:     Mental Status: He is alert.    Review of Systems  Respiratory:  Negative for shortness of breath.   Cardiovascular:  Negative for chest pain.  Gastrointestinal:  Negative for constipation, diarrhea, heartburn, nausea and vomiting.  Neurological:  Negative for headaches.   Blood pressure 124/86, pulse (!) 119, temperature 98.2 F (36.8 C), temperature source Oral, resp. rate 18, height 6\' 1"  (1.854 m), weight 66.2 kg, SpO2 100 %. Body mass index is 19.26 kg/m.   Treatment Plan Summary: Daily contact with patient to assess and evaluate symptoms and progress in treatment  Assessment: Schizophrenia by  hx TD  PLAN: Safety and Monitoring:  -- Involuntary admission to inpatient psychiatric unit for safety, stabilization and treatment  -- Daily contact with patient to assess and evaluate symptoms and progress in treatment  -- Patient's case  to be discussed in multi-disciplinary team meeting  -- Observation Level : q15 minute checks  -- Vital signs:  q12 hours  -- Precautions: suicide, elopement, and assault  2. Psychiatric Diagnoses and Treatment:   Schizophrenia by hx  -- Increase to Zyprexa 15mg  qhs for residual psychosis  -- Metabolic profile and EKG monitoring obtained while on an atypical antipsychotic (Lipid Panel: WNL other than HDL 36 on 5/25; HbgA1c: 5.9 on 5/25; QTc:411ms and on repeat 37ms)   -- Encouraged patient to participate in unit milieu and in scheduled group therapies     UDS positive for amphetamines  -- PDMP reviewed and patient received Adderall 5mg  bid #60 tabs 08/29/21   -- PRNS available for possible withdrawal   -- will encouraged not to restart given psychosis history     3. Medical Issues Being Addressed:   HTN  -- Continue Norvasc 5mg  daily (BPS 121/80 and 124/86)   Other medical issues being addressed: - prolactin level pending - TSH 2.661  Tardive Dyskinesia -- Previously on Ingrezza - continue 40mg  daily for lateral jaw movement and observe  EKG (cannot r/o age undetermined inferior infarct)  -- Questionably due to lead placement - repeat EKG on 8/29 shows sinus brady 59bpm with sinus arrhythmia, minimal voltage criteria for LVH, early repolarization and QTC 330ms - cardiology overread pending  Constipation -- Continue senokot bid  GERD -- Continue Protonix 40mg  daily   4. Discharge Planning:   -- Social work and case management to assist with discharge planning and identification of hospital follow-up needs prior to discharge  -- Discharge Concerns: Need to establish a safety plan; Medication compliance and effectiveness  -- Discharge  Goals: Return home with outpatient referrals for mental health follow-up including medication management/psychotherapy  Harlow Asa, MD, FAPA 09/07/2021, 7:09 AM

## 2021-09-07 NOTE — Progress Notes (Signed)
Wesley Peterson was isolative to his room.  He did come out for snack but did not attend evening wrap up group.  He denied SI/HI or VH.  He continues to hear voices but doesn't elaborate the content. He was in the shower for over 30 minutes but stated it was because "it felt good in the water."  He did come out to take his hs medications.  He hesitantly took the Zyprexa and declined the trazodone because he was afraid to take them together.  He is currently resting with his eyes closed and appears to be asleep.  Q 15 minute checks maintained for safety.   09/06/21 2142  Psych Admission Type (Psych Patients Only)  Admission Status Involuntary  Psychosocial Assessment  Patient Complaints Isolation  Eye Contact Brief  Facial Expression Flat  Affect Flat  Speech Soft;Slow  Interaction Avoidant;Forwards little;Guarded;Minimal  Motor Activity Slow  Appearance/Hygiene Unremarkable  Behavior Characteristics Unwilling to participate;Guarded  Mood Suspicious;Apprehensive  Thought Process  Coherency Blocking  Content Preoccupation  Delusions Paranoid  Perception Hallucinations  Hallucination Auditory  Judgment Poor  Confusion WDL  Danger to Self  Current suicidal ideation? Denies  Danger to Others  Danger to Others None reported or observed

## 2021-09-07 NOTE — Progress Notes (Signed)
Pt was in room most of the day in bed. Pt did leave unit for lunch.  Pt endorses auditory hallucinations today. Pt denies SI/HI/self harm thoughts. Pt declined to take laxative this morning stating he has had a bowel movement. Pt encouraged to come out of room as he can tolerate. Pt verbalized understanding.

## 2021-09-08 DIAGNOSIS — F209 Schizophrenia, unspecified: Secondary | ICD-10-CM | POA: Diagnosis not present

## 2021-09-08 MED ORDER — LORAZEPAM 1 MG PO TABS: 1.0000 mg | ORAL_TABLET | ORAL | Status: DC | PRN

## 2021-09-08 MED ORDER — ZIPRASIDONE HCL 40 MG PO CAPS
40.0000 mg | ORAL_CAPSULE | Freq: Two times a day (BID) | ORAL | Status: DC
  Administered 2021-09-08 – 2021-09-09 (×2): 40 mg via ORAL
  Filled 2021-09-08 (×6): qty 1

## 2021-09-08 MED ORDER — ZIPRASIDONE MESYLATE 20 MG IM SOLR: 20.0000 mg | INTRAMUSCULAR | Status: DC | PRN

## 2021-09-08 MED ORDER — OLANZAPINE 5 MG PO TBDP: 5.0000 mg | ORAL_TABLET | Freq: Three times a day (TID) | ORAL | Status: DC | PRN

## 2021-09-08 MED ORDER — SENNOSIDES-DOCUSATE SODIUM 8.6-50 MG PO TABS: 1.0000 | ORAL_TABLET | Freq: Every evening | ORAL | Status: DC | PRN

## 2021-09-08 MED ORDER — RISPERIDONE 1 MG PO TBDP
1.0000 mg | ORAL_TABLET | Freq: Two times a day (BID) | ORAL | Status: DC
  Administered 2021-09-08: 1 mg via ORAL
  Filled 2021-09-08 (×5): qty 1

## 2021-09-08 NOTE — Progress Notes (Signed)
  Pt refused scheduled dose of Olanzapine on last night.  Per pt, "It's making me sleep all day."

## 2021-09-08 NOTE — Plan of Care (Signed)
  Problem: Education: Goal: Emotional status will improve Outcome: Not Progressing Goal: Mental status will improve Outcome: Not Progressing Goal: Verbalization of understanding the information provided will improve Outcome: Not Progressing   

## 2021-09-08 NOTE — Progress Notes (Signed)
Pt states he is in a trance. Pt appears to be having paranoid thoughts. Pt saw someone else with a 1:1 sitter and is now stating "I need a sitter, I don't feel safe... because of my environment". Pt is encouraged to talk to staff and go into dayroom where staff is present. Pt remains safe on Q15 min checks and contracts for safety.

## 2021-09-08 NOTE — Plan of Care (Signed)
  Problem: Education: Goal: Knowledge of [facility] Education information/materials will improve Outcome: Progressing Goal: Emotional status will improve Outcome: Progressing Goal: Mental status will improve Outcome: Progressing Goal: Verbalization of understanding the information provided will improve Outcome: Progressing   Problem: Activity: Goal: Interest or engagement in activities will improve Outcome: Progressing Goal: Sleeping patterns will improve Outcome: Progressing   Problem: Coping: Goal: Ability to verbalize frustrations and anger appropriately will improve Outcome: Progressing Goal: Ability to demonstrate self-control will improve Outcome: Progressing   Problem: Health Behavior/Discharge Planning: Goal: Identification of resources available to assist in meeting health care needs will improve Outcome: Progressing Goal: Compliance with treatment plan for underlying cause of condition will improve Outcome: Progressing   Problem: Physical Regulation: Goal: Ability to maintain clinical measurements within normal limits will improve Outcome: Progressing   Problem: Safety: Goal: Periods of time without injury will increase Outcome: Progressing   Problem: Activity: Goal: Will verbalize the importance of balancing activity with adequate rest periods Outcome: Progressing   Problem: Education: Goal: Will be free of psychotic symptoms Outcome: Progressing Goal: Knowledge of the prescribed therapeutic regimen will improve Outcome: Progressing   Problem: Coping: Goal: Coping ability will improve Outcome: Progressing Goal: Will verbalize feelings Outcome: Progressing   Problem: Health Behavior/Discharge Planning: Goal: Compliance with prescribed medication regimen will improve Outcome: Progressing   Problem: Nutritional: Goal: Ability to achieve adequate nutritional intake will improve Outcome: Progressing   Problem: Role Relationship: Goal:  Ability to communicate needs accurately will improve Outcome: Progressing Goal: Ability to interact with others will improve Outcome: Progressing   Problem: Safety: Goal: Ability to redirect hostility and anger into socially appropriate behaviors will improve Outcome: Progressing Goal: Ability to remain free from injury will improve Outcome: Progressing   Problem: Self-Care: Goal: Ability to participate in self-care as condition permits will improve Outcome: Progressing   Problem: Self-Concept: Goal: Will verbalize positive feelings about self Outcome: Progressing   Problem: Education: Goal: Utilization of techniques to improve thought processes will improve Outcome: Progressing Goal: Knowledge of the prescribed therapeutic regimen will improve Outcome: Progressing   Problem: Activity: Goal: Interest or engagement in leisure activities will improve Outcome: Progressing Goal: Imbalance in normal sleep/wake cycle will improve Outcome: Progressing   Problem: Coping: Goal: Coping ability will improve Outcome: Progressing Goal: Will verbalize feelings Outcome: Progressing   Problem: Health Behavior/Discharge Planning: Goal: Ability to make decisions will improve Outcome: Progressing Goal: Compliance with therapeutic regimen will improve Outcome: Progressing   Problem: Role Relationship: Goal: Will demonstrate positive changes in social behaviors and relationships Outcome: Progressing   Problem: Safety: Goal: Ability to disclose and discuss suicidal ideas will improve Outcome: Progressing Goal: Ability to identify and utilize support systems that promote safety will improve Outcome: Progressing   Problem: Self-Concept: Goal: Will verbalize positive feelings about self Outcome: Progressing Goal: Level of anxiety will decrease Outcome: Progressing

## 2021-09-08 NOTE — Progress Notes (Signed)
Patient appears depressed and guarded. Patient denies SI/HI/VH. Pt endorses AH states "the voices are telling me to say it this way or that way". Pt could not elaborate on what the voices were saying. Patient complied with morning medication. Pt reported that zyprexa makes him sleepy and he doesn't want to take it. Pt is isolative and stays in his room. Patient remains safe on Q5min checks and contracts for safety.       09/08/21 0941  Psych Admission Type (Psych Patients Only)  Admission Status Involuntary  Psychosocial Assessment  Patient Complaints Depression;Anxiety;Sleep disturbance  Eye Contact Brief  Facial Expression Flat  Affect Flat;Sad  Speech Soft;Slow  Interaction Avoidant;Isolative;Forwards little  Motor Activity Slow  Appearance/Hygiene Unremarkable  Behavior Characteristics Cooperative;Guarded  Mood Depressed;Sad  Thought Process  Coherency Blocking  Content Preoccupation  Delusions Paranoid  Perception Hallucinations  Hallucination Auditory  Judgment Poor  Confusion None  Danger to Self  Current suicidal ideation? Denies  Danger to Others  Danger to Others None reported or observed

## 2021-09-08 NOTE — [facility] Group Notes (Signed)
Adult Psychoeducational Group Note  Date:  09/08/2021 Time:  10:19 AM  Group Topic/Focus:  Goals Group:   The focus of this group is to help patients establish daily goals to achieve during treatment and discuss how the patient can incorporate goal setting into their daily lives to aide in recovery.  Participation Level:  Did Not Attend  Additional Comments: Patient was told multiple times that group was starting, but did not attend.   Zachrey Deutscher T Lorraine Lax 09/08/2021, 10:19 AM

## 2021-09-08 NOTE — Progress Notes (Signed)
     09/07/21 2109  Psych Admission Type (Psych Patients Only)  Admission Status Involuntary  Psychosocial Assessment  Patient Complaints Depression;Isolation;Sadness  Eye Contact Brief  Facial Expression Flat  Affect Flat;Sad  Speech Soft;Slow  Interaction Avoidant;Forwards little;Isolative  Motor Activity Slow  Appearance/Hygiene Unremarkable  Behavior Characteristics Cooperative;Guarded  Mood Depressed;Sad  Thought Process  Coherency Blocking  Content Preoccupation  Delusions Paranoid  Perception Hallucinations  Hallucination Auditory (pt described as, "people voices")  Judgment Poor  Confusion WDL  Danger to Self  Current suicidal ideation? Denies  Danger to Others  Danger to Others None reported or observed

## 2021-09-08 NOTE — Progress Notes (Signed)
[facility] MD Progress Note  09/08/2021 7:59 AM Wesley Peterson  MRN:  [id number]  Chief Complaint: psychosis and SI  Reason for Admission:  Wesley Peterson is a 36 y.o [place] American male with a history of Schizophrenia who presented under IVC to the [facility] for treatment and stabilization of his mood and with complaints of worsening auditory hallucinations, passive SI, HI, & depressive symptoms in the context of medication non compliance and homelessness.  Urine drug screen positive for amphetamines.The patient is currently on Hospital Day 5.   Chart Review from last 24 hours:  The patient's chart was reviewed and nursing notes were reviewed. The patient's case was discussed in multidisciplinary team meeting. Per nursing, patient refused Zyprexa last night and continues to report AH. He did not attend groups. He was in his room for most of the day but did go to cafeteria for meals. Per MAR, he refused his Senokot and Zyprexa and did receive Tylenol X1 for pain.  Information Obtained Today During Patient Interview: The patient was seen and evaluated on the unit. He states he did not take the Zyprexa because it was making him too tired and sleepy during the day. I explained that the medication had been moved to qhs to help reduce daytime sedation yet he refused to continue taking it. He states he still has AH but will not comment on content. He states the voices seem very real to him and are distressing. When asked if they are command in nature, he states he is not listening to them now and "does not know." He continues to endorse belief that the voices have "stolen the sides of his penis" and that unknown persons are putting thoughts in and out of his head. He denies thought broadcasting, SI or HI. He denies ideas of reference or VH. He states he is not sure how well he is sleeping at night since he is napping during the day but reports good appetite. I discussed his mildly elevated prolactin, but he denies vision  changes or galactorrhea. He was advised that the prolactin appears chronically elevated and this is likely due to antipsychotic use. He reports resolution of constipation. He reports mild HA but no other physical complaints.   We spent time talking about medications he would agree to. After naming multiple medications, he states he thinks he did well in the past on Risperdal. I discussed LAI options and he refused. I advised that Risperdal has increased risk of worsening his prolactin level but he initially only agreed to Risperdal this morning and was resistant to other medications due to his paranoia about medications. I advised he would get a 1mg  dose of Risperdal and I would do chart review about his previous med trials and come back and discuss alternative agents with more prolactin sparing qualities this afternoon for him to consider.   Followup: Chart review was done and he appears to have tried and failed Abilify, and previously was on Risperdal, Zyprexa, Haldol dec, and Invega sustenna. It is unclear why these medications were stopped other than reference to lack of efficacy with Abilify. I returned this afternoon and talked to the patient in more detail. After much discussion, he agrees to a trial of Geodon which he has not consistently taken in the past (had one dose on admission). I advised this may be less sedating and should be more prolactin sparing. He agrees to medication change. The r/b/se/a to Geodon were discussed and time given for questions.  Principal Problem: Schizophrenia (HCC) Diagnosis: Principal Problem:   Schizophrenia (HCC)  Total Time Spent in Direct Patient Care:  I personally spent 40 minutes on the unit in direct patient care. The direct patient care time included face-to-face time with the patient, reviewing the patient's chart, communicating with other professionals, and coordinating care. Greater than 50% of this time was spent in counseling or coordinating care  with the patient regarding goals of hospitalization, psycho-education, and discharge planning needs.  Past Psychiatric History: see H&P  Past Medical History:  Past Medical History:  Diagnosis Date   Schizophrenia (HCC)    Schizophrenia, paranoid type (HCC) 02/02/2021   Family History: see H&P  Family Psychiatric  History: see H&P  Social History:  Social History   Substance and Sexual Activity  Alcohol Use Not Currently   Alcohol/week: 1.0 standard drink of alcohol   Types: 1 Cans of beer per week   Comment: occasional     Social History   Substance and Sexual Activity  Drug Use Never    Social History   Socioeconomic History   Marital status: Single    Spouse name: Not on file   Number of children: 1   Years of education: Not on file   Highest education level: Not on file  Occupational History   Occupation: Unemployed  Tobacco Use   Smoking status: Never   Smokeless tobacco: Never  Vaping Use   Vaping Use: Never used  Substance and Sexual Activity   Alcohol use: Not Currently    Alcohol/week: 1.0 standard drink of alcohol    Types: 1 Cans of beer per week    Comment: occasional   Drug use: Never   Sexual activity: Never  Other Topics Concern   Not on file  Social History Narrative   ** Merged History Encounter **       Pt lives with mother and other relatives.  Currently unemployed.  Receives outpatient psychiatry services through [facility].   Social Determinants of Health   Financial Resource Strain: Not on file  Food Insecurity: Not on file  Transportation Needs: Not on file  Physical Activity: Not on file  Stress: Not on file  Social Connections: Not on file    Sleep: 7.5 hours  Appetite:  Good  Current Medications: Current Facility-Administered Medications  Medication Dose Route Frequency Provider Last Rate Last Admin   acetaminophen (TYLENOL) tablet 650 mg  650 mg Oral Q6H PRN Leevy-Johnson, Brooke A, NP   650 mg at 09/07/21 2109   alum &  mag hydroxide-simeth (MAALOX/MYLANTA) 200-200-20 MG/5ML suspension 30 mL  30 mL Oral Q4H PRN Leevy-Johnson, Brooke A, NP       amLODipine (NORVASC) tablet 5 mg  5 mg Oral Daily Massengill, Nathan, MD   5 mg at 09/07/21 0086   benztropine (COGENTIN) tablet 0.5 mg  0.5 mg Oral BID PRN Comer Locket, MD       dicyclomine (BENTYL) tablet 20 mg  20 mg Oral Q6H PRN Mason Jim, Rekisha Welling E, MD       feeding supplement (ENSURE ENLIVE / ENSURE PLUS) liquid 237 mL  237 mL Oral BID BM Attiah, Nadir, MD   237 mL at 09/07/21 1428   hydrOXYzine (ATARAX) tablet 25 mg  25 mg Oral Q6H PRN Mason Jim, Ingram Onnen E, MD       loperamide (IMODIUM) capsule 2-4 mg  2-4 mg Oral PRN Mason Jim, Kendrea Cerritos E, MD       OLANZapine zydis (ZYPREXA) disintegrating tablet 5 mg  5 mg Oral Q8H PRN Massengill, Harrold Donath, MD       And   LORazepam (ATIVAN) tablet 1 mg  1 mg Oral Q6H PRN Massengill, Harrold Donath, MD       And   ziprasidone (GEODON) injection 20 mg  20 mg Intramuscular Q6H PRN Massengill, Harrold Donath, MD       magnesium hydroxide (MILK OF MAGNESIA) suspension 30 mL  30 mL Oral Daily PRN Leevy-Johnson, Brooke A, NP       methocarbamol (ROBAXIN) tablet 500 mg  500 mg Oral Q8H PRN Mason Jim, Gracyn Santillanes E, MD       naproxen (NAPROSYN) tablet 500 mg  500 mg Oral BID PRN Comer Locket, MD       OLANZapine zydis (ZYPREXA) disintegrating tablet 10 mg  10 mg Oral QHS Massengill, Nathan, MD   10 mg at 09/06/21 2142   ondansetron (ZOFRAN-ODT) disintegrating tablet 4 mg  4 mg Oral Q6H PRN Comer Locket, MD       pantoprazole (PROTONIX) EC tablet 40 mg  40 mg Oral Daily Mason Jim, Ruthann Angulo E, MD   40 mg at 09/07/21 0347   senna-docusate (Senokot-S) tablet 1 tablet  1 tablet Oral BID Comer Locket, MD   1 tablet at 09/06/21 1847   traZODone (DESYREL) tablet 50 mg  50 mg Oral QHS PRN Massengill, Harrold Donath, MD   50 mg at 09/06/21 0121   valbenazine (INGREZZA) capsule 40 mg  40 mg Oral Daily Comer Locket, MD   40 mg at 09/07/21 4259    Lab Results: No results found  for this or any previous visit (from the past 48 hour(s)).   Blood Alcohol level:  Lab Results  Component Value Date   ETH <10 08/31/2021   ETH <10 07/21/2021    Metabolic Disorder Labs: Lab Results  Component Value Date   HGBA1C 5.9 (H) 06/02/2021   MPG 122.63 06/02/2021   MPG 126 02/03/2021   Lab Results  Component Value Date   PROLACTIN 19.9 (H) 09/06/2021   PROLACTIN 17.5 (H) 06/02/2021   Lab Results  Component Value Date   CHOL 137 06/02/2021   TRIG 105 06/02/2021   HDL 36 (L) 06/02/2021   CHOLHDL 3.8 06/02/2021   VLDL 21 06/02/2021   LDLCALC 80 06/02/2021   LDLCALC 80 01/30/2021    Physical Findings:  Musculoskeletal: Strength & Muscle Tone: untested in bed Gait & Station: untested in bed Patient leans: N/A  Psychiatric Specialty Exam:  Presentation  General Appearance: Casually dressed, fair hygiene  Eye Contact:Minimal  Speech:Normal rate and fluency  Speech Volume:Decreased  Mood and Affect  Mood:Aloof, suspicious  Affect:restricted, guarded   Thought Process  Thought Processes:Linear for most of interview; concrete  Orientation:Oriented to city, month and year not situation  Thought Content: Has delusion that someone has tampered with his penis, has belief in thought insertion/withdrawal and report of AH. Denies VH, thought broadcasting or ideas of reference. Denies SI or HI; admits to paranoia and appears guarded on exam - is not grossly responding to internal/external stimuli on exam  History of Schizophrenia/Schizoaffective disorder:Yes  Duration of Psychotic Symptoms:Greater than six months  Hallucinations:Auditory -will not discuss content  Ideas of Reference:Denied  Suicidal Thoughts:Denied  Homicidal Thoughts:Denied   Sensorium  Memory:Fair  Judgment:Impaired  Insight:Shallow   Executive Functions  Concentration:Fair  Attention Span:Fair  Recall:Fair  Fund of Knowledge:Fair  Language:Fair   Psychomotor  Activity  Psychomotor Activity:Improved lateral lower jaw movements today - no cogwheeling, stiffness, tremor, involuntary  movements of extremities/trunk, no lip smacking/puckering or grimacing. No involuntary tongue movements.  Assets  Assets:Physical Health; Communication Skills  Physical Exam Vitals and nursing note reviewed.  Constitutional:      Appearance: Normal appearance.  Pulmonary:     Effort: Pulmonary effort is normal. No respiratory distress.  Neurological:     Mental Status: He is alert.    Review of Systems  Respiratory:  Negative for shortness of breath.   Cardiovascular:  Negative for chest pain.  Gastrointestinal:  Negative for constipation, diarrhea, heartburn, nausea and vomiting.  Neurological:  Positive for headaches.   Blood pressure 123/79, pulse 70, temperature 97.6 F (36.4 C), temperature source Oral, resp. rate 18, height 6\' 1"  (1.854 m), weight 66.2 kg, SpO2 97 %. Body mass index is 19.26 kg/m.   Treatment Plan Summary: Daily contact with patient to assess and evaluate symptoms and progress in treatment  Assessment: Schizophrenia by hx TD  PLAN: Safety and Monitoring:  -- Involuntary admission to inpatient psychiatric unit for safety, stabilization and treatment  -- Daily contact with patient to assess and evaluate symptoms and progress in treatment  -- Patient's case to be discussed in multi-disciplinary team meeting  -- Observation Level : q15 minute checks  -- Vital signs:  q12 hours  -- Precautions: suicide, elopement, and assault  2. Psychiatric Diagnoses and Treatment:   Schizophrenia by hx  -- Refused Zyprexa due to c/o sedation; reviewed records and it appears he previously was managed with Zyprexa, Invega sustenna and Risperdal po, previously did not respond to Abilify dosing and was previously managed with Haldol dec. Initially this morning he only agreed to trial of Risperdal and received 1mg  po - due to concerns to avoid forced  medications over objection, I agreed to Risperdal today but after chart review and repeat discussion this afternoon with patient he agreed to transition to Geodon instead which should be more prolactin sparing. R/b/se/a to Geodon were reviewed - will start Geodon 40mg  bid and monitor  -- Metabolic profile and EKG monitoring obtained while on an atypical antipsychotic (Lipid Panel: WNL other than HDL 36 on 5/25; HbgA1c: 5.9 on 5/25; QTc:434ms and on repeat 6/25)   -- Encouraged patient to participate in unit milieu and in scheduled group therapies     UDS positive for amphetamines  -- PDMP reviewed and patient received Adderall 5mg  bid #60 tabs 08/29/21   -- PRNS available for possible withdrawal   -- will encouraged not to restart given psychosis history     3. Medical Issues Being Addressed:   HTN  -- Continue Norvasc 5mg  daily (BPS 129/87 and 123/79 today)   Mildly elevated Prolactin level   -- level 19.9 and ws 17.5 3 months ago and 23.4 2 years ago - continue to monitor while on antipsychotic - currently asymptomatic   Tardive Dyskinesia -- Previously on Ingrezza - continue 40mg  daily for lateral jaw movement and observe  EKG (cannot r/o age undetermined inferior infarct)  -- Questionably due to lead placement - repeat EKG on 8/29 shows sinus brady 59bpm with sinus arrhythmia, minimal voltage criteria for LVH, early repolarization and QTC - cardiology overread pending -- will monitor QTC closely with start of Geodon  Constipation - resolved -- Change Senokot to PRN - currently refusing med  GERD -- Continue Protonix 40mg  daily   4. Discharge Planning:   -- Social work and case management to assist with discharge planning and identification of hospital follow-up needs prior to discharge  --  Discharge Concerns: Need to establish a safety plan; Medication compliance and effectiveness  -- Discharge Goals: Return home with outpatient referrals for mental health follow-up  including medication management/psychotherapy  Comer LocketAmy E Dysen Edmondson, MD, FAPA 09/08/2021, 7:59 AM

## 2021-09-08 NOTE — Progress Notes (Signed)
     09/08/21 2100  Psych Admission Type (Psych Patients Only)  Admission Status Involuntary  Psychosocial Assessment  Patient Complaints Depression;Sadness  Eye Contact Brief  Facial Expression Flat  Affect Flat;Sad  Speech Soft;Slow  Interaction Avoidant;Isolative;Forwards little  Motor Activity Slow  Appearance/Hygiene Unremarkable  Behavior Characteristics Cooperative;Guarded  Mood Depressed;Sad  Thought Process  Coherency Blocking  Content Preoccupation  Delusions Paranoid  Perception Hallucinations  Hallucination Auditory  Judgment Poor  Confusion None  Danger to Self  Current suicidal ideation? Denies  Danger to Others  Danger to Others None reported or observed

## 2021-09-09 ENCOUNTER — Encounter (HOSPITAL_COMMUNITY): Payer: Self-pay

## 2021-09-09 MED ORDER — ZIPRASIDONE HCL 40 MG PO CAPS
40.0000 mg | ORAL_CAPSULE | Freq: Two times a day (BID) | ORAL | Status: AC
  Administered 2021-09-09: 40 mg via ORAL
  Filled 2021-09-09: qty 1

## 2021-09-09 MED ORDER — ZIPRASIDONE HCL 60 MG PO CAPS
60.0000 mg | ORAL_CAPSULE | Freq: Two times a day (BID) | ORAL | Status: DC
  Administered 2021-09-10 – 2021-09-15 (×11): 60 mg via ORAL
  Filled 2021-09-09: qty 1
  Filled 2021-09-09: qty 14
  Filled 2021-09-09 (×4): qty 1
  Filled 2021-09-09: qty 14
  Filled 2021-09-09: qty 1
  Filled 2021-09-09: qty 14
  Filled 2021-09-09 (×4): qty 1
  Filled 2021-09-09: qty 14
  Filled 2021-09-09 (×3): qty 1

## 2021-09-09 NOTE — Plan of Care (Signed)
  Problem: Coping: Goal: Ability to verbalize frustrations and anger appropriately will improve Outcome: Progressing Goal: Ability to demonstrate self-control will improve Outcome: Progressing   Problem: Education: Goal: Emotional status will improve Outcome: Not Progressing Goal: Mental status will improve Outcome: Not Progressing

## 2021-09-09 NOTE — Plan of Care (Signed)
  Problem: Coping: Goal: Ability to verbalize frustrations and anger appropriately will improve Outcome: Progressing Goal: Ability to demonstrate self-control will improve Outcome: Progressing   Problem: Activity: Goal: Interest or engagement in activities will improve Outcome: Progressing Goal: Sleeping patterns will improve Outcome: Progressing   Problem: Safety: Goal: Periods of time without injury will increase Outcome: Progressing   

## 2021-09-09 NOTE — BH IP Treatment Plan (Signed)
Interdisciplinary Treatment and Diagnostic Plan Update  09/09/2021 Time of Session: 9:55am  Burleigh Brockmann MRN: [id number]  Principal Diagnosis: Schizophrenia [facility])  Secondary Diagnoses: Principal Problem:   Schizophrenia (HCC)   Current Medications:  Current Facility-Administered Medications  Medication Dose Route Frequency Provider Last Rate Last Admin   acetaminophen (TYLENOL) tablet 650 mg  650 mg Oral Q6H PRN Leevy-Johnson, Brooke A, NP   650 mg at 09/07/21 2109   alum & mag hydroxide-simeth (MAALOX/MYLANTA) 200-200-20 MG/5ML suspension 30 mL  30 mL Oral Q4H PRN Leevy-Johnson, Brooke A, NP       amLODipine (NORVASC) tablet 5 mg  5 mg Oral Daily Massengill, Nathan, MD   5 mg at 09/09/21 0813   benztropine (COGENTIN) tablet 0.5 mg  0.5 mg Oral BID PRN Comer Locket, MD       dicyclomine (BENTYL) tablet 20 mg  20 mg Oral Q6H PRN Mason Jim, Amy E, MD       feeding supplement (ENSURE ENLIVE / ENSURE PLUS) liquid 237 mL  237 mL Oral BID BM Attiah, Nadir, MD   237 mL at 09/09/21 0813   hydrOXYzine (ATARAX) tablet 25 mg  25 mg Oral Q6H PRN Comer Locket, MD   25 mg at 09/08/21 1624   loperamide (IMODIUM) capsule 2-4 mg  2-4 mg Oral PRN Comer Locket, MD       OLANZapine zydis (ZYPREXA) disintegrating tablet 5 mg  5 mg Oral Q8H PRN Comer Locket, MD       And   LORazepam (ATIVAN) tablet 1 mg  1 mg Oral PRN Comer Locket, MD       And   ziprasidone (GEODON) injection 20 mg  20 mg Intramuscular PRN Mason Jim, Amy E, MD       magnesium hydroxide (MILK OF MAGNESIA) suspension 30 mL  30 mL Oral Daily PRN Leevy-Johnson, Brooke A, NP       methocarbamol (ROBAXIN) tablet 500 mg  500 mg Oral Q8H PRN Mason Jim, Amy E, MD       naproxen (NAPROSYN) tablet 500 mg  500 mg Oral BID PRN Mason Jim, Amy E, MD       ondansetron (ZOFRAN-ODT) disintegrating tablet 4 mg  4 mg Oral Q6H PRN Mason Jim, Amy E, MD       pantoprazole (PROTONIX) EC tablet 40 mg  40 mg Oral Daily Mason Jim, Amy E, MD   40 mg  at 09/08/21 0820   senna-docusate (Senokot-S) tablet 1 tablet  1 tablet Oral QHS PRN Comer Locket, MD       traZODone (DESYREL) tablet 50 mg  50 mg Oral QHS PRN Massengill, Harrold Donath, MD   50 mg at 09/06/21 0121   valbenazine (INGREZZA) capsule 40 mg  40 mg Oral Daily Mason Jim, Amy E, MD   40 mg at 09/09/21 7371   ziprasidone (GEODON) capsule 40 mg  40 mg Oral BID WC Mason Jim, Amy E, MD   40 mg at 09/09/21 0626   PTA Medications: Medications Prior to Admission  Medication Sig Dispense Refill Last Dose   amphetamine-dextroamphetamine (ADDERALL) 5 MG tablet Take 1 tablet by mouth 2 (two) times daily.   Past Week   benztropine (COGENTIN) 1 MG tablet Take 1 tablet (1 mg total) by mouth at bedtime. 30 tablet 0     Patient Stressors: Financial difficulties   Marital or family conflict   Medication change or noncompliance    Patient Strengths: General fund of knowledge   Treatment Modalities: Medication Management, Group therapy,  Case management,  1 to 1 session with clinician, Psychoeducation, Recreational therapy.   Physician Treatment Plan for Primary Diagnosis: Schizophrenia (HCC) Long Term Goal(s): Improvement in symptoms so as ready for discharge   Short Term Goals: Ability to identify changes in lifestyle to reduce recurrence of condition will improve Ability to verbalize feelings will improve Ability to disclose and discuss suicidal ideas Ability to demonstrate self-control will improve Ability to identify and develop effective coping behaviors will improve Ability to maintain clinical measurements within normal limits will improve Compliance with prescribed medications will improve Ability to identify triggers associated with substance abuse/mental health issues will improve  Medication Management: Evaluate patient's response, side effects, and tolerance of medication regimen.  Therapeutic Interventions: 1 to 1 sessions, Unit Group sessions and Medication  administration.  Evaluation of Outcomes: Progressing  Physician Treatment Plan for Secondary Diagnosis: Principal Problem:   Schizophrenia (HCC)  Long Term Goal(s): Improvement in symptoms so as ready for discharge   Short Term Goals: Ability to identify changes in lifestyle to reduce recurrence of condition will improve Ability to verbalize feelings will improve Ability to disclose and discuss suicidal ideas Ability to demonstrate self-control will improve Ability to identify and develop effective coping behaviors will improve Ability to maintain clinical measurements within normal limits will improve Compliance with prescribed medications will improve Ability to identify triggers associated with substance abuse/mental health issues will improve     Medication Management: Evaluate patient's response, side effects, and tolerance of medication regimen.  Therapeutic Interventions: 1 to 1 sessions, Unit Group sessions and Medication administration.  Evaluation of Outcomes: Progressing   RN Treatment Plan for Primary Diagnosis: Schizophrenia (HCC) Long Term Goal(s): Knowledge of disease and therapeutic regimen to maintain health will improve  Short Term Goals: Ability to remain free from injury will improve, Ability to participate in decision making will improve, Ability to verbalize feelings will improve, Ability to disclose and discuss suicidal ideas, and Ability to identify and develop effective coping behaviors will improve  Medication Management: RN will administer medications as ordered by provider, will assess and evaluate patient's response and provide education to patient for prescribed medication. RN will report any adverse and/or side effects to prescribing provider.  Therapeutic Interventions: 1 on 1 counseling sessions, Psychoeducation, Medication administration, Evaluate responses to treatment, Monitor vital signs and CBGs as ordered, Perform/monitor CIWA, COWS, AIMS and  Fall Risk screenings as ordered, Perform wound care treatments as ordered.  Evaluation of Outcomes: Progressing   LCSW Treatment Plan for Primary Diagnosis: Schizophrenia (HCC) Long Term Goal(s): Safe transition to appropriate next level of care at discharge, Engage patient in therapeutic group addressing interpersonal concerns.  Short Term Goals: Engage patient in aftercare planning with referrals and resources, Increase social support, Increase emotional regulation, Facilitate acceptance of mental health diagnosis and concerns, Identify triggers associated with mental health/substance abuse issues, and Increase skills for wellness and recovery  Therapeutic Interventions: Assess for all discharge needs, 1 to 1 time with Social worker, Explore available resources and support systems, Assess for adequacy in community support network, Educate family and significant other(s) on suicide prevention, Complete Psychosocial Assessment, Interpersonal group therapy.  Evaluation of Outcomes: Progressing   Progress in Treatment: Attending groups: No. Participating in groups: No. Taking medication as prescribed: Yes. Toleration medication: Yes. Family/Significant other contact made: No, will contact:  CSW will obtain consent to reach family/friend.  Patient understands diagnosis: Yes. Discussing patient identified problems/goals with staff: Yes. Medical problems stabilized or resolved: Yes. Denies suicidal/homicidal ideation: No. Issues/concerns  per patient self-inventory: Yes. Other: none   New problem(s) identified: No, Describe:  none   New Short Term/Long Term Goal(s): Patient to work towards detox, elimination of symptoms of psychosis, medication management for mood stabilization; elimination of SI thoughts; development of comprehensive mental wellness/sobriety plan.   Patient Goals:  Patient states their goal for treatment is to "get my strength back . . . Attention and concentration."    Discharge Plan or Barriers: No psychosocial barriers identified at this time, patient to return to place of residence when appropriate for discharge.    Reason for Continuation of Hospitalization: Depression Other; describe psychotic features    Estimated Length of Stay:1-7 days    Last 3 [place] Suicide Severity Risk Score: Flowsheet Row [facility] (Current[facility] from 08[facility]31/2021 in [facility] ED from 07/21/2021 in [facility] EMERGENCY DEPARTMENT  C-SSRS RISK CATEGORY No Risk No Risk No Risk       Last PHQ 2/9 Scores:     No data to display          Scribe for Treatment Team: Aram Beecham, Theresia Majors 09/09/2021 2:08 PM

## 2021-09-09 NOTE — Progress Notes (Signed)
     09/09/21 2036  Psych Admission Type (Psych Patients Only)  Admission Status Involuntary  Psychosocial Assessment  Patient Complaints Depression;Sadness  Eye Contact Brief  Facial Expression Blank  Affect Flat  Speech Soft;Slow  Interaction Cautious;Guarded;Forwards little  Motor Activity Slow  Appearance/Hygiene Unremarkable  Behavior Characteristics Cooperative;Calm;Guarded  Mood Pleasant;Depressed;Sad  Thought Process  Coherency Blocking  Content Preoccupation  Delusions Paranoid  Perception Hallucinations  Hallucination Auditory  Judgment Poor  Confusion None  Danger to Self  Current suicidal ideation? Denies  Danger to Others  Danger to Others None reported or observed

## 2021-09-09 NOTE — Progress Notes (Signed)
Adult Psychoeducational Group Note  Date:  09/09/2021 Time:  8:24 PM  Group Topic/Focus:  Wrap-Up Group:   The focus of this group is to help patients review their daily goal of treatment and discuss progress on daily workbooks.  Participation Level:  Active  Participation Quality:  Appropriate  Affect:  Appropriate  Cognitive:  Appropriate  Insight: Appropriate  Engagement in Group:  Engaged  Modes of Intervention:  Education and Exploration  Additional Comments:  Patient attended and participated in group tonight. He reports that while he has been here he learn to not to be on his medication, His doctor changed his medication three times since he has been here. He will not be taking his medication when he leaves.  Lita Mains [facility] 09/09/2021, 8:24 PM

## 2021-09-09 NOTE — Group Note (Signed)
LCSW Group Therapy Note   Group Date: 09/09/2021 Start Time: 1300 End Time: 1400  Type of Therapy and Topic: Group Therapy: Anger Management   Participation Level:  Did Not Attend  Description of Group: In this group, patients will learn helpful strategies and techniques to manage anger, express anger in alternative ways, change hostile attitudes, and prevent aggressive acts, such as verbal abuse and violence.This group will be process-oriented and eductional, with patients participating in exploration of their own experiences as well as giving and receiving support and challenge from other group members.  Therapeutic Goals: Patient will learn to manage anger. Patient will learn to stop violence or the threat of violence. Patient will learn to develop self control over thoughts and actions. Patient will receive support and feedback from others  Summary of Patient Progress: Did not attend    Therapeutic Modalities: Cognitive Behavioral Therapy Solution Focused Therapy Motivational Interviewing  Aram Beecham, Theresia Majors 09/09/2021  1:35 PM

## 2021-09-09 NOTE — Progress Notes (Signed)
[facility] MD Progress Note  09/09/2021 3:24 PM Wesley Peterson  MRN:  [id number]  Chief Complaint: psychosis and SI  Reason for Admission:  Wesley Peterson is a 36 y.o [place] American male with a history of Schizophrenia who presented under IVC to the [facility] for treatment and stabilization of his mood and with complaints of worsening auditory hallucinations, passive SI, HI, & depressive symptoms in the context of medication non compliance and homelessness.  Urine drug screen positive for amphetamines.The patient is currently on Hospital Day 6.   Chart Review from last 24 hours:  The patient's chart was reviewed and nursing notes were reviewed. The patient's case was discussed in multidisciplinary team meeting. Per nursing, the patient was isolative, guarded, paranoid and reported AH. He had not acute behavioral issues noted. He did not attend groups. Per MAR, he refused Senokot but was otherwise compliant with scheduled medications. He did receive Vistaril X1 for anxiety.  Information Obtained Today During Patient Interview: The patient was seen and evaluated on the unit. He denies medication side-effects with start of Geodon. He reports sleep is "okay" and appetite is "good." He voices no physical complaints and states he has no further GERD or constipation. He states he showered last night and attended 1 group. He states he has not had belief in thought insertion/withdrawal since yesterday and denies VH. He continues to report AH but will not discuss content, frequency or intensity. He denies thought broadcasting or ideas of reference but he continues to believe that the voices have tampered with his penis. He states he does not feel he is "really here" at times. He denies SI, HI, or depressed mood or anxiety. He denies any jaw movement or TD/EPS symptoms that he is aware of.   Principal Problem: Schizophrenia (HCC) Diagnosis: Principal Problem:   Schizophrenia (HCC)  Total Time Spent in Direct Patient  Care:  I personally spent 25 minutes on the unit in direct patient care. The direct patient care time included face-to-face time with the patient, reviewing the patient's chart, communicating with other professionals, and coordinating care. Greater than 50% of this time was spent in counseling or coordinating care with the patient regarding goals of hospitalization, psycho-education, and discharge planning needs.  Past Psychiatric History: see H&P  Past Medical History:  Past Medical History:  Diagnosis Date   Schizophrenia (HCC)    Schizophrenia, paranoid type (HCC) 02/02/2021   Family History: see H&P  Family Psychiatric  History: see H&P  Social History:  Social History   Substance and Sexual Activity  Alcohol Use Not Currently   Alcohol/week: 1.0 standard drink of alcohol   Types: 1 Cans of beer per week   Comment: occasional     Social History   Substance and Sexual Activity  Drug Use Never    Social History   Socioeconomic History   Marital status: Single    Spouse name: Not on file   Number of children: 1   Years of education: Not on file   Highest education level: Not on file  Occupational History   Occupation: Unemployed  Tobacco Use   Smoking status: Never   Smokeless tobacco: Never  Vaping Use   Vaping Use: Never used  Substance and Sexual Activity   Alcohol use: Not Currently    Alcohol/week: 1.0 standard drink of alcohol    Types: 1 Cans of beer per week    Comment: occasional   Drug use: Never   Sexual activity: Never  Other Topics Concern  Not on file  Social History Narrative   ** Merged History Encounter **       Pt lives with mother and other relatives.  Currently unemployed.  Receives outpatient psychiatry services through [facility].   Social Determinants of Health   Financial Resource Strain: Not on file  Food Insecurity: Not on file  Transportation Needs: Not on file  Physical Activity: Not on file  Stress: Not on file  Social  Connections: Not on file    Sleep: 7.75 hours  Appetite:  Good  Current Medications: Current Facility-Administered Medications  Medication Dose Route Frequency Provider Last Rate Last Admin   acetaminophen (TYLENOL) tablet 650 mg  650 mg Oral Q6H PRN Leevy-Johnson, Brooke A, NP   650 mg at 09/07/21 2109   alum & mag hydroxide-simeth (MAALOX/MYLANTA) 200-200-20 MG/5ML suspension 30 mL  30 mL Oral Q4H PRN Leevy-Johnson, Brooke A, NP       amLODipine (NORVASC) tablet 5 mg  5 mg Oral Daily Massengill, Nathan, MD   5 mg at 09/09/21 0813   benztropine (COGENTIN) tablet 0.5 mg  0.5 mg Oral BID PRN Comer Locket, MD       dicyclomine (BENTYL) tablet 20 mg  20 mg Oral Q6H PRN Mason Jim, Lilleigh Hechavarria E, MD       feeding supplement (ENSURE ENLIVE / ENSURE PLUS) liquid 237 mL  237 mL Oral BID BM Attiah, Nadir, MD   237 mL at 09/09/21 0813   hydrOXYzine (ATARAX) tablet 25 mg  25 mg Oral Q6H PRN Comer Locket, MD   25 mg at 09/08/21 1624   loperamide (IMODIUM) capsule 2-4 mg  2-4 mg Oral PRN Comer Locket, MD       OLANZapine zydis (ZYPREXA) disintegrating tablet 5 mg  5 mg Oral Q8H PRN Comer Locket, MD       And   LORazepam (ATIVAN) tablet 1 mg  1 mg Oral PRN Comer Locket, MD       And   ziprasidone (GEODON) injection 20 mg  20 mg Intramuscular PRN Mason Jim, Curry Seefeldt E, MD       magnesium hydroxide (MILK OF MAGNESIA) suspension 30 mL  30 mL Oral Daily PRN Leevy-Johnson, Brooke A, NP       methocarbamol (ROBAXIN) tablet 500 mg  500 mg Oral Q8H PRN Mason Jim, Maxximus Gotay E, MD       naproxen (NAPROSYN) tablet 500 mg  500 mg Oral BID PRN Mason Jim, Carmeron Heady E, MD       ondansetron (ZOFRAN-ODT) disintegrating tablet 4 mg  4 mg Oral Q6H PRN Mason Jim, Damontae Loppnow E, MD       pantoprazole (PROTONIX) EC tablet 40 mg  40 mg Oral Daily Mason Jim, Pessy Delamar E, MD   40 mg at 09/08/21 0820   senna-docusate (Senokot-S) tablet 1 tablet  1 tablet Oral QHS PRN Comer Locket, MD       traZODone (DESYREL) tablet 50 mg  50 mg Oral QHS PRN  Massengill, Harrold Donath, MD   50 mg at 09/06/21 0121   valbenazine (INGREZZA) capsule 40 mg  40 mg Oral Daily Mason Jim, Chonda Baney E, MD   40 mg at 09/09/21 9562   ziprasidone (GEODON) capsule 40 mg  40 mg Oral BID WC Comer Locket, MD   40 mg at 09/09/21 1308    Lab Results: No results found for this or any previous visit (from the past 48 hour(s)).   Blood Alcohol level:  Lab Results  Component Value Date   ETH <  10 08/31/2021   ETH <10 07/21/2021    Metabolic Disorder Labs: Lab Results  Component Value Date   HGBA1C 5.9 (H) 06/02/2021   MPG 122.63 06/02/2021   MPG 126 02/03/2021   Lab Results  Component Value Date   PROLACTIN 19.9 (H) 09/06/2021   PROLACTIN 17.5 (H) 06/02/2021   Lab Results  Component Value Date   CHOL 137 06/02/2021   TRIG 105 06/02/2021   HDL 36 (L) 06/02/2021   CHOLHDL 3.8 06/02/2021   VLDL 21 06/02/2021   LDLCALC 80 06/02/2021   LDLCALC 80 01/30/2021    Physical Findings:  Musculoskeletal: Strength & Muscle Tone: untested in bed Gait & Station: untested in bed Patient leans: N/A  Psychiatric Specialty Exam:  Presentation  General Appearance: Casually dressed, fair hygiene  Eye Contact:Minimal  Speech:Normal rate and fluency - answers questions with short phrases  Speech Volume:Decreased  Mood and Affect  Mood:Aloof, suspicious  Affect:restricted, guarded   Thought Process  Thought Processes:Linear for most of interview; concrete  Orientation:Oriented to city, month and year not situation  Thought Content: Has delusion that someone has tampered with his penis, denies belief in thought insertion/withdrawal/broadcasting today; reports residual AH. Denies VH or ideas of reference. Denies SI or HI; admits to paranoia and appears guarded on exam - is not grossly responding to internal/external stimuli on exam  History of Schizophrenia/Schizoaffective disorder:Yes  Duration of Psychotic Symptoms:Greater than six  months  Hallucinations:Auditory -will not discuss content  Ideas of Reference:Denied  Suicidal Thoughts:Denied  Homicidal Thoughts:Denied   Sensorium  Memory:Fair  Judgment:Impaired  Insight:Shallow   Executive Functions  Concentration:Fair  Attention Span:Fair  Recall:Fair  Fund of Knowledge:Fair  Language:Fair   Psychomotor Activity  Psychomotor Activity: No lateral jaw movements today; no tremor or other involuntary movements noted on exam; no akathisias  Assets  Assets:Physical Health; Communication Skills  Physical Exam Vitals and nursing note reviewed.  Constitutional:      Appearance: Normal appearance.  Pulmonary:     Effort: Pulmonary effort is normal. No respiratory distress.  Neurological:     Mental Status: He is alert.    Review of Systems  Respiratory:  Negative for shortness of breath.   Cardiovascular:  Negative for chest pain.  Gastrointestinal:  Negative for constipation, diarrhea, heartburn, nausea and vomiting.  Neurological:  Negative for dizziness and headaches.   Blood pressure (!) 125/93, pulse (!) 125, temperature 97.7 F (36.5 C), temperature source Oral, resp. rate 18, height 6\' 1"  (1.854 m), weight 66.2 kg, SpO2 96 %. Body mass index is 19.26 kg/m.   Treatment Plan Summary: Daily contact with patient to assess and evaluate symptoms and progress in treatment  Assessment: Schizophrenia by hx TD  PLAN: Safety and Monitoring:  -- Involuntary admission to inpatient psychiatric unit for safety, stabilization and treatment  -- Daily contact with patient to assess and evaluate symptoms and progress in treatment  -- Patient's case to be discussed in multi-disciplinary team meeting  -- Observation Level : q15 minute checks  -- Vital signs:  q12 hours  -- Precautions: suicide, elopement, and assault  2. Psychiatric Diagnoses and Treatment:   Schizophrenia by hx  -- Continue Geodon 40mg  bid with meals - titrating up to 60mg   bid tomorrow for residual psychosis  -- Metabolic profile and EKG monitoring obtained while on an atypical antipsychotic (Lipid Panel: WNL other than HDL 36 on 5/25; HbgA1c: 5.9 on 5/25; QTc:465ms and on repeat 6/25)   -- Encouraged patient to participate in unit milieu  and in scheduled group therapies     UDS positive for amphetamines  -- PDMP reviewed and patient received Adderall 5mg  bid #60 tabs 08/29/21   -- PRNS available for possible withdrawal   -- will encouraged not to restart given psychosis history     3. Medical Issues Being Addressed:   HTN  -- Continue Norvasc 5mg  daily (BPS 118/88 and 125/93 today)   Mildly elevated Prolactin level   -- level 19.9 and ws 17.5 3 months ago and 23.4 2 years ago - continue to monitor while on antipsychotic - currently asymptomatic   Tardive Dyskinesia -- Previously on Ingrezza - continue 40mg  daily for lateral jaw movement and observe  EKG (cannot r/o age undetermined inferior infarct)  -- Questionably due to lead placement - repeat EKG on 8/29 shows sinus brady 59bpm with sinus arrhythmia, minimal voltage criteria for LVH, early repolarization and QTC - cardiology over-read pending -- will monitor QTC closely with start of Geodon  Constipation - resolved -- Change Senokot to PRN - currently refusing med  GERD -- Continue Protonix 40mg  daily   4. Discharge Planning:   -- Social work and case management to assist with discharge planning and identification of hospital follow-up needs prior to discharge  -- Discharge Concerns: Need to establish a safety plan; Medication compliance and effectiveness  -- Discharge Goals: Return home with outpatient referrals for mental health follow-up including medication management/psychotherapy  , MD, FAPA 09/09/2021, 3:24 PM

## 2021-09-09 NOTE — Plan of Care (Signed)
°  Problem: Education: °Goal: Emotional status will improve °Outcome: Progressing °Goal: Mental status will improve °Outcome: Progressing °  °Problem: Activity: °Goal: Interest or engagement in activities will improve °Outcome: Progressing °  °

## 2021-09-09 NOTE — Progress Notes (Signed)
   09/09/21 0800  Psych Admission Type (Psych Patients Only)  Admission Status Involuntary  Psychosocial Assessment  Patient Complaints Depression;Worrying;Sadness  Eye Contact Brief  Facial Expression Blank  Affect Sullen  Speech Soft;Slow  Interaction Cautious;Guarded  Motor Activity Slow  Appearance/Hygiene Unremarkable  Behavior Characteristics Cooperative;Guarded  Thought Process  Coherency Blocking  Content Preoccupation  Delusions Paranoid  Perception Hallucinations  Hallucination Auditory  Judgment Poor  Confusion None  Danger to Self  Current suicidal ideation? Denies  Danger to Others  Danger to Others None reported or observed

## 2021-09-10 NOTE — Progress Notes (Signed)
   09/10/21 2118  Psych Admission Type (Psych Patients Only)  Admission Status Involuntary  Psychosocial Assessment  Patient Complaints Insomnia  Eye Contact Brief  Facial Expression Blank  Affect Flat  Speech Logical/coherent;Soft;Slow  Interaction Cautious;Forwards little;Guarded;Isolative;Minimal;No initiation  Motor Activity Other (Comment) (unremarkable)  Appearance/Hygiene Unremarkable  Behavior Characteristics Cooperative;Guarded;Calm  Mood Pleasant;Apprehensive  Thought Process  Coherency Blocking  Content Preoccupation  Delusions Paranoid  Perception Hallucinations  Hallucination Auditory  Judgment Poor  Confusion None  Danger to Self  Current suicidal ideation? Denies  Danger to Others  Danger to Others Reported or observed

## 2021-09-10 NOTE — Progress Notes (Signed)
Adult Psychoeducational Group Note  Date:  09/10/2021 Time:  8:13 PM  Group Topic/Focus:  Wrap-Up Group:   The focus of this group is to help patients review their daily goal of treatment and discuss progress on daily workbooks.  Participation Level:  Did Not Attend  Participation Quality:  Did Not Attend  Affect:  Did Not Attend  Cognitive:  Did Not Attend  Insight: Did Not Attend  Engagement in Group:  Did Not Attend  Modes of Intervention:  Did Not Attend  Additional Comments:   Pt was encouragaged to attend group discussion but refused   Vevelyn Pat 09/10/2021, 8:13 PM

## 2021-09-10 NOTE — Progress Notes (Signed)
   Pt approached Clinical research associate with complaint of being unable to sleep.  Pt appears to be anxious.   Administered PRN Hydroxyzine and Trazodone per pt request.

## 2021-09-10 NOTE — Group Note (Signed)
  [facility] LCSW Group Therapy Note  Date/Time:  09/10/2021   Type of Therapy and Topic:  Group Therapy:  Feelings About Hospitalization  Participation Level:  Did Not Attend   Description of Group This process group involved patients discussing their feelings related to being hospitalized, as well as the benefits they see to being in the hospital.  These feelings and benefits were itemized.  The group then brainstormed specific ways in which they could seek those same benefits when they discharge and return home.  Therapeutic Goals Patient will identify and describe positive and negative feelings related to hospitalization Patient will verbalize benefits of hospitalization to themselves personally Patients will brainstorm together ways they can obtain similar benefits in the outpatient setting, identify barriers to wellness and possible solutions  Summary of Patient Progress:  The patient did not attend this group.  Therapeutic Modalities Cognitive Behavioral Therapy Motivational Interviewing    [place], [place] 09/10/2021, 10:26 AM

## 2021-09-10 NOTE — Progress Notes (Signed)
[facility] MD Progress Note  09/10/2021 8:15 AM Wesley Peterson  MRN:  [id number]  Chief Complaint: psychosis and SI  Reason for Admission:  Wesley Peterson is a 36 y.o [place] American male with a history of Schizophrenia who presented under IVC to the [facility] for treatment and stabilization of his mood and with complaints of worsening auditory hallucinations, passive SI, HI, & depressive symptoms in the context of medication non compliance and homelessness.  Urine drug screen positive for amphetamines.The patient is currently on Hospital Day 7.   Chart Review from last 24 hours:  The patient's chart was reviewed and nursing notes were reviewed. The patient's case was discussed in multidisciplinary team meeting. Per nursing, he attended one group and expressed in group he is not planning on taking medication when he leaves. He has remained guarded with report of AH to staff. He had no acute behavioral issues noted. Per [facility] he was compliant with scheduled medications with exception of refusal of Protonix. He did receive Tylenol x1, Vistaril X1 and Trazodone X1.   Information Obtained Today During Patient Interview: The patient was seen and evaluated on the unit. He is again found lying in bed and states he feels his medications are making him tired during the day. He was advised that we have changed medications multiple times due to this complaint and that he needs to resist daytime naps, be out of his room, and be more involved in milieu and groups as distractions for sedation. When questioned about his comment that he made in group that he will not take medications after discharge, he states he is frustrated with medication changes. I advised that based on past history he failed Haldol and Abilify, recently refused Zyprexa, and based on prolactin levels we are trying Geodon. I advised he has to stay on medication long enough for it to work and he agrees to continue Geodon. Improved sleep hygiene was encouraged. He  states he does not want Trazodone PRN to avoid daytime sedation. He reports fair appetite. He states the AH are "gone" but does not make eye contact and appears internally preoccupied at times on exam. He remains guarded but denies paranoia and no longer reports belief that voices are tampering with his penis. He denies ideas of reference, first rank symptoms or magical thinking but questionably is unreliable based on body language/internal preoccupation noted on exam. He denies VH. He denies SI or HI. His oriented X3 and voices no physical complaints. He specifically denies issues with constipation or GERD. He denies any TD/EPS symptoms but does have intermittent lower jaw lateral movements with distraction.   Principal Problem: Schizophrenia (HCC) Diagnosis: Principal Problem:   Schizophrenia (HCC)  Total Time Spent in Direct Patient Care:  I personally spent 25 minutes on the unit in direct patient care. The direct patient care time included face-to-face time with the patient, reviewing the patient's chart, communicating with other professionals, and coordinating care. Greater than 50% of this time was spent in counseling or coordinating care with the patient regarding goals of hospitalization, psycho-education, and discharge planning needs.  Past Psychiatric History: see H&P  Past Medical History:  Past Medical History:  Diagnosis Date   Schizophrenia (HCC)    Schizophrenia, paranoid type (HCC) 02/02/2021   Family History: see H&P  Family Psychiatric  History: see H&P  Social History:  Social History   Substance and Sexual Activity  Alcohol Use Not Currently   Alcohol/week: 1.0 standard drink of alcohol   Types: 1 Cans  of beer per week   Comment: occasional     Social History   Substance and Sexual Activity  Drug Use Never    Social History   Socioeconomic History   Marital status: Single    Spouse name: Not on file   Number of children: 1   Years of education: Not on file    Highest education level: Not on file  Occupational History   Occupation: Unemployed  Tobacco Use   Smoking status: Never   Smokeless tobacco: Never  Vaping Use   Vaping Use: Never used  Substance and Sexual Activity   Alcohol use: Not Currently    Alcohol/week: 1.0 standard drink of alcohol    Types: 1 Cans of beer per week    Comment: occasional   Drug use: Never   Sexual activity: Never  Other Topics Concern   Not on file  Social History Narrative   ** Merged History Encounter **       Pt lives with mother and other relatives.  Currently unemployed.  Receives outpatient psychiatry services through [facility].   Social Determinants of Health   Financial Resource Strain: Not on file  Food Insecurity: Not on file  Transportation Needs: Not on file  Physical Activity: Not on file  Stress: Not on file  Social Connections: Not on file    Sleep: 4.5 hours  Appetite:  Fair  Current Medications: Current Facility-Administered Medications  Medication Dose Route Frequency Provider Last Rate Last Admin   acetaminophen (TYLENOL) tablet 650 mg  650 mg Oral Q6H PRN Leevy-Johnson, Brooke A, NP   650 mg at 09/09/21 2036   alum & mag hydroxide-simeth (MAALOX/MYLANTA) 200-200-20 MG/5ML suspension 30 mL  30 mL Oral Q4H PRN Leevy-Johnson, Brooke A, NP       amLODipine (NORVASC) tablet 5 mg  5 mg Oral Daily Massengill, Nathan, MD   5 mg at 09/09/21 0813   benztropine (COGENTIN) tablet 0.5 mg  0.5 mg Oral BID PRN Comer Locket, MD       dicyclomine (BENTYL) tablet 20 mg  20 mg Oral Q6H PRN Mason Jim, Romani Wilbon E, MD       feeding supplement (ENSURE ENLIVE / ENSURE PLUS) liquid 237 mL  237 mL Oral BID BM Attiah, Nadir, MD   237 mL at 09/09/21 1813   hydrOXYzine (ATARAX) tablet 25 mg  25 mg Oral Q6H PRN Comer Locket, MD   25 mg at 09/09/21 2301   loperamide (IMODIUM) capsule 2-4 mg  2-4 mg Oral PRN Comer Locket, MD       OLANZapine zydis (ZYPREXA) disintegrating tablet 5 mg  5 mg Oral Q8H  PRN Comer Locket, MD       And   LORazepam (ATIVAN) tablet 1 mg  1 mg Oral PRN Comer Locket, MD       And   ziprasidone (GEODON) injection 20 mg  20 mg Intramuscular PRN Mason Jim, Fatimah Sundquist E, MD       magnesium hydroxide (MILK OF MAGNESIA) suspension 30 mL  30 mL Oral Daily PRN Leevy-Johnson, Brooke A, NP       methocarbamol (ROBAXIN) tablet 500 mg  500 mg Oral Q8H PRN Mason Jim, Uriel Horkey E, MD       naproxen (NAPROSYN) tablet 500 mg  500 mg Oral BID PRN Mason Jim, Zai Chmiel E, MD       ondansetron (ZOFRAN-ODT) disintegrating tablet 4 mg  4 mg Oral Q6H PRN Comer Locket, MD  pantoprazole (PROTONIX) EC tablet 40 mg  40 mg Oral Daily Mason Jim, Corayma Cashatt E, MD   40 mg at 09/08/21 0820   senna-docusate (Senokot-S) tablet 1 tablet  1 tablet Oral QHS PRN Comer Locket, MD       traZODone (DESYREL) tablet 50 mg  50 mg Oral QHS PRN Massengill, Harrold Donath, MD   50 mg at 09/09/21 2301   valbenazine (INGREZZA) capsule 40 mg  40 mg Oral Daily Mason Jim, Titianna Loomis E, MD   40 mg at 09/09/21 2202   ziprasidone (GEODON) capsule 60 mg  60 mg Oral BID WC Comer Locket, MD        Lab Results: No results found for this or any previous visit (from the past 48 hour(s)).   Blood Alcohol level:  Lab Results  Component Value Date   ETH <10 08/31/2021   ETH <10 07/21/2021    Metabolic Disorder Labs: Lab Results  Component Value Date   HGBA1C 5.9 (H) 06/02/2021   MPG 122.63 06/02/2021   MPG 126 02/03/2021   Lab Results  Component Value Date   PROLACTIN 19.9 (H) 09/06/2021   PROLACTIN 17.5 (H) 06/02/2021   Lab Results  Component Value Date   CHOL 137 06/02/2021   TRIG 105 06/02/2021   HDL 36 (L) 06/02/2021   CHOLHDL 3.8 06/02/2021   VLDL 21 06/02/2021   LDLCALC 80 06/02/2021   LDLCALC 80 01/30/2021    Physical Findings:  Musculoskeletal: Strength & Muscle Tone: untested in bed Gait & Station: untested in bed Patient leans: N/A  Psychiatric Specialty Exam:  Presentation  General Appearance:  Casually dressed, fair hygiene  Eye Contact:Minimal  Speech:some speech latency - answers questions with short phrases with direct questions  Speech Volume:Decreased  Mood and Affect  Mood:Aloof, suspicious  Affect:restricted, guarded   Thought Process  Thought Processes:Some questionable thought blocking on exam with latency of speech at times; concrete  Orientation:Oriented to city, month, Economist, and year not situation  Thought Content: Appears internally preoccupied at times on exam with some speech latency ; denies AVH, ideas of reference, first rank symptoms, or magical thinking  History of Schizophrenia/Schizoaffective disorder:Yes  Duration of Psychotic Symptoms:Greater than six months  Hallucinations:Denied  Ideas of Reference:Denied  Suicidal Thoughts:Denied  Homicidal Thoughts:Denied   Sensorium  Memory:Fair  Judgment:Impaired  Insight:Shallow   Executive Functions  Concentration:Fair  Attention Span:Fair  Recall:Not formally tested  [employer] of Knowledge:Fair  Language:Fair   Psychomotor Activity  Psychomotor Activity: Minimal lateral jaw movement with activation/distraction; no cogwheeling, stiffness or tremor, no tongue movements, no lip smacking/puckering/grimacing or involuntary movements of extremities or trunk  Assets  Assets:Physical Health; Communication Skills  Physical Exam Vitals and nursing note reviewed.  Constitutional:      Appearance: Normal appearance.  Pulmonary:     Effort: Pulmonary effort is normal. No respiratory distress.  Neurological:     Mental Status: He is alert.    Review of Systems  Respiratory:  Negative for shortness of breath.   Cardiovascular:  Negative for chest pain.  Gastrointestinal:  Negative for constipation, diarrhea, heartburn, nausea and vomiting.  Neurological:  Negative for dizziness and headaches.   Blood pressure (!) 125/93, pulse 86, temperature 97.9 F (36.6 C), temperature source  Oral, resp. rate 16, height 6\' 1"  (1.854 m), weight 66.2 kg, SpO2 99 %. Body mass index is 19.26 kg/m.   Treatment Plan Summary: Daily contact with patient to assess and evaluate symptoms and progress in treatment  Assessment: Schizophrenia by hx TD  PLAN: Safety and Monitoring:  -- Involuntary admission to inpatient psychiatric unit for safety, stabilization and treatment  -- Daily contact with patient to assess and evaluate symptoms and progress in treatment  -- Patient's case to be discussed in multi-disciplinary team meeting  -- Observation Level : q15 minute checks  -- Vital signs:  q12 hours  -- Precautions: suicide, elopement, and assault  2. Psychiatric Diagnoses and Treatment:   Schizophrenia by hx  -- Increase Geodon today to 60mg  bid for residual psychosis  -- Metabolic profile and EKG monitoring obtained while on an atypical antipsychotic (Lipid Panel: WNL other than HDL 36 on 5/25; HbgA1c: 5.9 on 5/25; QTc:422ms and on repeat 13m)   -- Encouraged patient to participate in unit milieu and in scheduled group therapies   -- Will inquire with SW to see if he qualifies for ACTT referral    UDS positive for amphetamines  -- PDMP reviewed and patient received Adderall 5mg  bid #60 tabs 08/29/21   -- PRNS available for possible withdrawal   -- will encouraged not to restart given psychosis history     3. Medical Issues Being Addressed:   HTN  -- Continue Norvasc 5mg  daily (BPS 119/84 and 125/93 today)   Mildly elevated Prolactin level   -- level 19.9 and ws 17.5 3 months ago and 23.4 2 years ago - continue to monitor while on antipsychotic - currently asymptomatic   Tardive Dyskinesia -- Previously on Ingrezza - continue 40mg  daily for lateral jaw movement and observe  EKG (cannot r/o age undetermined inferior infarct)  -- Questionably due to lead placement - repeat EKG on 8/29 shows sinus brady 59bpm with sinus arrhythmia, minimal voltage criteria for LVH, early  repolarization and QTC 08/31/21 - cardiology over-read pending - patient asymptomatic for cardiac issues -- will monitor QTC closely with start of Geodon - repeating EKG tomorrow  Constipation - resolved -- Change Senokot to PRN - currently refusing med  GERD -- Continue Protonix 40mg  daily   4. Discharge Planning:   -- Social work and case management to assist with discharge planning and identification of hospital follow-up needs prior to discharge  -- Discharge Concerns: Need to establish a safety plan; Medication compliance and effectiveness  -- Discharge Goals: Return home with outpatient referrals for mental health follow-up including medication management/psychotherapy  , MD, FAPA 09/10/2021, 8:15 AM

## 2021-09-10 NOTE — Progress Notes (Signed)
   09/10/21 1100  Psych Admission Type (Psych Patients Only)  Admission Status Involuntary  Psychosocial Assessment  Patient Complaints Depression;Worrying  Eye Contact Brief  Facial Expression Blank  Affect Flat  Speech Soft;Slow  Interaction Cautious;Guarded;Forwards little  Motor Activity Slow  Appearance/Hygiene Unremarkable  Behavior Characteristics Cooperative;Calm;Guarded  Mood Pleasant  Thought Process  Coherency Blocking  Content Preoccupation  Delusions Paranoid  Perception Hallucinations  Hallucination Auditory  Judgment Poor  Confusion None  Danger to Self  Current suicidal ideation? Denies  Danger to Others  Danger to Others None reported or observed

## 2021-09-11 DIAGNOSIS — F1510 Other stimulant abuse, uncomplicated: Secondary | ICD-10-CM | POA: Diagnosis not present

## 2021-09-11 DIAGNOSIS — Z91148 Patient's other noncompliance with medication regimen for other reason: Secondary | ICD-10-CM | POA: Diagnosis not present

## 2021-09-11 DIAGNOSIS — I10 Essential (primary) hypertension: Secondary | ICD-10-CM | POA: Diagnosis not present

## 2021-09-11 DIAGNOSIS — F209 Schizophrenia, unspecified: Secondary | ICD-10-CM | POA: Diagnosis not present

## 2021-09-11 MED ORDER — MELATONIN 3 MG PO TABS
3.0000 mg | ORAL_TABLET | Freq: Every evening | ORAL | Status: DC | PRN
  Administered 2021-09-13: 3 mg via ORAL
  Filled 2021-09-11 (×2): qty 1
  Filled 2021-09-11: qty 7

## 2021-09-11 NOTE — [facility] Group Notes (Signed)
Adult Psychoeducational Group Note  Date:  09/11/2021 Time:  4:47 PM  Group Topic/Focus:  Goals Group:   The focus of this group is to help patients establish daily goals to achieve during treatment and discuss how the patient can incorporate goal setting into their daily lives to aide in recovery.  Participation Level:  Did Not Attend   Additional Comments:  Patient was told multiple times that group was starting but still did not attend.   Osmond Steckman T Lorraine Lax 09/11/2021, 4:47 PM

## 2021-09-11 NOTE — Progress Notes (Signed)
The focus of this group is to help patients review their daily goal of treatment and discuss progress on daily workbooks.  Pt attended the evening group and responded to all discussion prompts from the Writer. Pt shared that today was a good day on the unit, the highlight of which was taking to his eight-year-old son on the phone about going back to school tomorrow.  Pt told that their goal for the coming week was to hopefully discharge home, preferably on Tuesday or Wednesday. Pt was encouraged to broach this subject with his treatment team for feedback.  Pt rated his day an 8 out of 10 and his affect was appropriate.

## 2021-09-11 NOTE — Progress Notes (Addendum)
[facility] MD Progress Note  09/11/2021 6:58 AM Dvontae Ruan  MRN:  [id number]  Chief Complaint: psychosis and SI  Reason for Admission:  Wesley Peterson is a 36 y.o [place] American male with a history of Schizophrenia who presented under IVC to the [facility] for treatment and stabilization of his mood and with complaints of worsening auditory hallucinations, passive SI, HI, & depressive symptoms in the context of medication non compliance and homelessness.  Urine drug screen positive for amphetamines.The patient is currently on Hospital Day 8.   Chart Review from last 24 hours:  The patient's chart was reviewed and nursing notes were reviewed. The patient's case was discussed in multidisciplinary team meeting. Per nursing, he remains guarded and isolative. He did not attend groups. He had no acute behavioral issues noted. Per [facility] he was compliant with scheduled medications except refusal of Protonix. He received Trazodone X1 and Tylenol X1 PRN.  Information Obtained Today During Patient Interview: The patient was seen and evaluated on the unit. He is in bed again this morning and states he feels sedated. He does not want the trazodone but does not seem to understand it is PRN for sleep and he can refuse it. I advised I would change to Melatonin PRN tonight and see if he feels less sedated and stressed it is PRN only. He states no one told him when groups were occurring yesterday so he did not attend. I advised he will be on room lockout for meals, snacks and groups today to help bolster his participation with prompts from staff. He states he is eating well and voices no physical complaints. He specifically denies issues with constipation or GERD. He states his mood is "anxious" because he is bored and tired of being in the hospital. He denies SI or HI. He denies ideas of reference, thought broadcasting/insertion/withdrawal but does endorse belief of paranoia about unknown persons and concerns that at night the  voices are messing with his penis. He states the voices are better and quieter and will not discuss residual content of AH. He later questions if this examiner believes what he is experiencing is real stating the voices seem real to him. He denies TD/EPS sx or medication side-effects.   Principal Problem: Schizophrenia (HCC) Diagnosis: Principal Problem:   Schizophrenia (HCC)  Total Time Spent in Direct Patient Care:  I personally spent 25 minutes on the unit in direct patient care. The direct patient care time included face-to-face time with the patient, reviewing the patient's chart, communicating with other professionals, and coordinating care. Greater than 50% of this time was spent in counseling or coordinating care with the patient regarding goals of hospitalization, psycho-education, and discharge planning needs.  Past Psychiatric History: see H&P  Past Medical History:  Past Medical History:  Diagnosis Date   Schizophrenia (HCC)    Schizophrenia, paranoid type (HCC) 02/02/2021   Family History: see H&P  Family Psychiatric  History: see H&P  Social History:  Social History   Substance and Sexual Activity  Alcohol Use Not Currently   Alcohol/week: 1.0 standard drink of alcohol   Types: 1 Cans of beer per week   Comment: occasional     Social History   Substance and Sexual Activity  Drug Use Never    Social History   Socioeconomic History   Marital status: Single    Spouse name: Not on file   Number of children: 1   Years of education: Not on file   Highest education level: Not  on file  Occupational History   Occupation: Unemployed  Tobacco Use   Smoking status: Never   Smokeless tobacco: Never  Vaping Use   Vaping Use: Never used  Substance and Sexual Activity   Alcohol use: Not Currently    Alcohol/week: 1.0 standard drink of alcohol    Types: 1 Cans of beer per week    Comment: occasional   Drug use: Never   Sexual activity: Never  Other Topics Concern    Not on file  Social History Narrative   ** Merged History Encounter **       Pt lives with mother and other relatives.  Currently unemployed.  Receives outpatient psychiatry services through [facility].   Social Determinants of Health   Financial Resource Strain: Not on file  Food Insecurity: Not on file  Transportation Needs: Not on file  Physical Activity: Not on file  Stress: Not on file  Social Connections: Not on file    Sleep: 7.5 hours  Appetite:  Fair  Current Medications: Current Facility-Administered Medications  Medication Dose Route Frequency Provider Last Rate Last Admin   acetaminophen (TYLENOL) tablet 650 mg  650 mg Oral Q6H PRN Leevy-Johnson, Brooke A, NP   650 mg at 09/10/21 0934   alum & mag hydroxide-simeth (MAALOX/MYLANTA) 200-200-20 MG/5ML suspension 30 mL  30 mL Oral Q4H PRN Leevy-Johnson, Brooke A, NP       amLODipine (NORVASC) tablet 5 mg  5 mg Oral Daily Massengill, Nathan, MD   5 mg at 09/10/21 0934   benztropine (COGENTIN) tablet 0.5 mg  0.5 mg Oral BID PRN Comer Locket, MD       dicyclomine (BENTYL) tablet 20 mg  20 mg Oral Q6H PRN Mason Jim, Hermione Havlicek E, MD       feeding supplement (ENSURE ENLIVE / ENSURE PLUS) liquid 237 mL  237 mL Oral BID BM Attiah, Nadir, MD   237 mL at 09/09/21 1813   hydrOXYzine (ATARAX) tablet 25 mg  25 mg Oral Q6H PRN Comer Locket, MD   25 mg at 09/09/21 2301   loperamide (IMODIUM) capsule 2-4 mg  2-4 mg Oral PRN Comer Locket, MD       OLANZapine zydis (ZYPREXA) disintegrating tablet 5 mg  5 mg Oral Q8H PRN Comer Locket, MD       And   LORazepam (ATIVAN) tablet 1 mg  1 mg Oral PRN Comer Locket, MD       And   ziprasidone (GEODON) injection 20 mg  20 mg Intramuscular PRN Mason Jim, Kennon Encinas E, MD       magnesium hydroxide (MILK OF MAGNESIA) suspension 30 mL  30 mL Oral Daily PRN Leevy-Johnson, Brooke A, NP       methocarbamol (ROBAXIN) tablet 500 mg  500 mg Oral Q8H PRN Mason Jim, Mckinzie Saksa E, MD       naproxen (NAPROSYN)  tablet 500 mg  500 mg Oral BID PRN Mason Jim, Zaelynn Fuchs E, MD       ondansetron (ZOFRAN-ODT) disintegrating tablet 4 mg  4 mg Oral Q6H PRN Mason Jim, Deyanna Mctier E, MD       pantoprazole (PROTONIX) EC tablet 40 mg  40 mg Oral Daily Mason Jim, Dinh Ayotte E, MD   40 mg at 09/08/21 0820   senna-docusate (Senokot-S) tablet 1 tablet  1 tablet Oral QHS PRN Comer Locket, MD       traZODone (DESYREL) tablet 50 mg  50 mg Oral QHS PRN Phineas Inches, MD   50 mg at 09/10/21  2118   valbenazine (INGREZZA) capsule 40 mg  40 mg Oral Daily Mason Jim, Carlyle Mcelrath E, MD   40 mg at 09/10/21 0934   ziprasidone (GEODON) capsule 60 mg  60 mg Oral BID WC Comer Locket, MD   60 mg at 09/10/21 1720    Lab Results: No results found for this or any previous visit (from the past 48 hour(s)).   Blood Alcohol level:  Lab Results  Component Value Date   ETH <10 08/31/2021   ETH <10 07/21/2021    Metabolic Disorder Labs: Lab Results  Component Value Date   HGBA1C 5.9 (H) 06/02/2021   MPG 122.63 06/02/2021   MPG 126 02/03/2021   Lab Results  Component Value Date   PROLACTIN 19.9 (H) 09/06/2021   PROLACTIN 17.5 (H) 06/02/2021   Lab Results  Component Value Date   CHOL 137 06/02/2021   TRIG 105 06/02/2021   HDL 36 (L) 06/02/2021   CHOLHDL 3.8 06/02/2021   VLDL 21 06/02/2021   LDLCALC 80 06/02/2021   LDLCALC 80 01/30/2021    Physical Findings:  Musculoskeletal: Strength & Muscle Tone: untested in bed Gait & Station: untested in bed Patient leans: N/A  Psychiatric Specialty Exam:  Presentation  General Appearance: Casually dressed, fair hygiene  Eye Contact:Minimal  Speech:some speech latency - answers questions with short phrases with direct questions  Speech Volume:Decreased  Mood and Affect  Mood:Aloof, suspicious  Affect:restricted, guarded   Thought Process  Thought Processes:Linear, concrete  Orientation:Oriented to city, month, Economist, and year not situation  Thought Content:  some speech  latency at times on exam and appears guarded ; denies VH, ideas of reference, or thought insertion/withdrawal/broadcasting; endorses paranoia, delusion voices are messing with his penis, and residual AH  History of Schizophrenia/Schizoaffective disorder:Yes  Duration of Psychotic Symptoms:Greater than six months  Hallucinations:AH - will not discuss content  Ideas of Reference:Denied  Suicidal Thoughts:Denied  Homicidal Thoughts:Denied   Sensorium  Memory:Fair  Judgment:Impaired  Insight:Shallow   Executive Functions  Concentration:Fair  Attention Span:Fair  Recall:Not formally tested  [employer] of Knowledge:Fair  Language:Fair   Psychomotor Activity  Psychomotor Activity: No lateral jaw movement with activation/distraction today; no akathisias or tremors noted  Assets  Assets:Physical Health; Communication Skills  Physical Exam Vitals and nursing note reviewed.  Constitutional:      Appearance: Normal appearance.  Pulmonary:     Effort: Pulmonary effort is normal. No respiratory distress.  Neurological:     Mental Status: He is alert.    Review of Systems  Respiratory:  Negative for shortness of breath.   Cardiovascular:  Negative for chest pain.  Gastrointestinal:  Negative for constipation, diarrhea, heartburn, nausea and vomiting.  Neurological:  Negative for dizziness and headaches.   Blood pressure 131/85, pulse 77, temperature 98.7 F (37.1 C), temperature source Oral, resp. rate 16, height 6\' 1"  (1.854 m), weight 66.2 kg, SpO2 98 %. Body mass index is 19.26 kg/m.   Treatment Plan Summary: Daily contact with patient to assess and evaluate symptoms and progress in treatment  Assessment: Schizophrenia by hx TD  PLAN: Safety and Monitoring:  -- Involuntary admission to inpatient psychiatric unit for safety, stabilization and treatment  -- Daily contact with patient to assess and evaluate symptoms and progress in treatment  -- Patient's case to  be discussed in multi-disciplinary team meeting  -- Observation Level : q15 minute checks  -- Vital signs:  q12 hours  -- Precautions: suicide, elopement, and assault  -- Placed on room lock  out for meals and groups   2. Psychiatric Diagnoses and Treatment:   Schizophrenia by hx  -- Continue Geodon 60mg  bid for residual psychosis  -- Metabolic profile and EKG monitoring obtained while on an atypical antipsychotic (Lipid Panel: WNL other than HDL 36 on 5/25; HbgA1c: 5.9 on 5/25; QTc:442ms and on repeat 13m)   -- Encouraged patient to participate in unit milieu and in scheduled group therapies   -- Will inquire with SW to see if he qualifies for ACTT referral  -- D/c trazodone and try Melatonin PRN to see if helps reduce daytime sedation - stressed to patient to use only PRN if he cannot sleep    UDS positive for amphetamines  -- PDMP reviewed and patient received Adderall 5mg  bid #60 tabs 08/29/21   -- PRNS available for possible withdrawal   -- will encouraged not to restart given psychosis history     3. Medical Issues Being Addressed:   HTN  -- Continue Norvasc 5mg  daily (BPS 119/84 and 125/93 today)   Mildly elevated Prolactin level   -- level 19.9 and ws 17.5 3 months ago and 23.4 2 years ago - continue to monitor while on antipsychotic - currently asymptomatic   Tardive Dyskinesia -- Previously on Ingrezza - continue 40mg  daily for lateral jaw movement and observe  EKG (cannot r/o age undetermined inferior infarct)  -- Questionably due to lead placement - repeat EKG on 8/29 shows sinus brady 59bpm with sinus arrhythmia, minimal voltage criteria for LVH, early repolarization and QTC 08/31/21 - cardiology over-read pending - patient asymptomatic for cardiac issues -- will monitor QTC closely with start of Geodon - repeating EKG today  Constipation - resolved -- Change Senokot to PRN - currently refusing med  GERD -- Continue Protonix 40mg  daily   4. Discharge Planning:   --  Social work and case management to assist with discharge planning and identification of hospital follow-up needs prior to discharge  -- Discharge Concerns: Need to establish a safety plan; Medication compliance and effectiveness  -- Discharge Goals: Return home with outpatient referrals for mental health follow-up including medication management/psychotherapy  , MD, FAPA 09/11/2021, 6:58 AM

## 2021-09-11 NOTE — Group Note (Signed)
[facility] LCSW Group Therapy Note  Date/Time:  09/11/2021   Type of Therapy and Topic:  Group Therapy:  Healthy and Unhealthy Supports  Participation Level:  Active   Description of Group:  Patients in this group were introduced to the idea of adding a variety of healthy supports to address the various needs in their lives.  Patients discussed what additional healthy supports could be helpful in their recovery and wellness after discharge in order to prevent future hospitalizations such as counselor, doctor, other levels of psychiatric care such as ACTT services, therapy groups, 12-step groups, and problem-specific support groups.  A demonstration was given about how to set boundaries which patients expressed was beneficial.   Therapeutic Goals:   1)  discuss importance of adding supports to stay well once out of the hospital  2)  compare healthy versus unhealthy supports and identify some examples of each  3)  generate ideas and descriptions of healthy supports that can be added  4)  offer mutual support about how to address unhealthy supports  5)  encourage active participation in and adherence to discharge plan    Summary of Patient Progress:  The patient stated that current healthy supports in his life are his father that encourages him.  The patient expressed that some friends don't have his best interests at heart.   Therapeutic Modalities:   Motivational Interviewing Brief Solution-Focused Therapy  Meighan Treto [place], [address]

## 2021-09-11 NOTE — Progress Notes (Signed)
   09/11/21 1000  Psych Admission Type (Psych Patients Only)  Admission Status Involuntary  Psychosocial Assessment  Patient Complaints Depression  Eye Contact Brief  Facial Expression Blank  Affect Flat  Speech Soft;Slow  Interaction Cautious;Guarded;Forwards little  Motor Activity Slow  Appearance/Hygiene Unremarkable  Behavior Characteristics Cooperative;Calm;Guarded  Mood Apprehensive;Pleasant  Thought Process  Coherency Blocking  Content Preoccupation  Delusions Paranoid  Perception Hallucinations  Hallucination Auditory  Judgment Poor  Confusion None  Danger to Self  Current suicidal ideation? Denies  Danger to Others  Danger to Others None reported or observed  Danger to Others Abnormal  Harmful Behavior to others No threats or harm toward other people  Destructive Behavior No threats or harm toward property

## 2021-09-12 DIAGNOSIS — Z91148 Patient's other noncompliance with medication regimen for other reason: Secondary | ICD-10-CM | POA: Diagnosis not present

## 2021-09-12 DIAGNOSIS — F1510 Other stimulant abuse, uncomplicated: Secondary | ICD-10-CM | POA: Diagnosis not present

## 2021-09-12 DIAGNOSIS — I10 Essential (primary) hypertension: Secondary | ICD-10-CM | POA: Diagnosis not present

## 2021-09-12 DIAGNOSIS — F209 Schizophrenia, unspecified: Secondary | ICD-10-CM | POA: Diagnosis not present

## 2021-09-12 NOTE — Progress Notes (Signed)
   09/12/21 2200  Psych Admission Type (Psych Patients Only)  Admission Status Involuntary  Psychosocial Assessment  Patient Complaints Depression  Eye Contact Brief  Facial Expression Animated  Affect Preoccupied;Depressed  Speech Soft;Slow  Interaction Guarded;Forwards little  Motor Activity Slow  Appearance/Hygiene Unremarkable  Behavior Characteristics Cooperative  Mood Preoccupied  Thought Process  Coherency Blocking  Content Preoccupation  Delusions Paranoid  Perception Hallucinations  Hallucination Auditory  Judgment Poor  Confusion None  Danger to Self  Current suicidal ideation? Denies  Danger to Others  Danger to Others None reported or observed  Danger to Others Abnormal  Harmful Behavior to others No threats or harm toward other people  Destructive Behavior No threats or harm toward property

## 2021-09-12 NOTE — Progress Notes (Signed)
   09/11/21 2300  Psych Admission Type (Psych Patients Only)  Admission Status Involuntary  Psychosocial Assessment  Patient Complaints Isolation;Depression  Eye Contact Brief  Facial Expression Flat  Affect Flat  Speech Soft;Slow  Interaction Cautious;Guarded;Forwards little  Motor Activity Slow  Appearance/Hygiene Unremarkable  Behavior Characteristics Appropriate to situation;Cooperative  Mood Pleasant  Thought Process  Coherency Blocking  Content Preoccupation  Delusions Paranoid  Perception Hallucinations  Hallucination Auditory  Judgment Poor  Confusion None  Danger to Self  Current suicidal ideation? Denies  Danger to Others  Danger to Others None reported or observed  Danger to Others Abnormal  Harmful Behavior to others No threats or harm toward other people  Destructive Behavior No threats or harm toward property

## 2021-09-12 NOTE — Progress Notes (Signed)
Pt in room often. Pt did go to lunch with peers. Pt endorses auditory hallucinations. Pt states the voice he hears says "His" repeatedly. Pt denies voice is commendatory. EKG performed today per physician order. Q15 minute checks ongoing for safety.

## 2021-09-12 NOTE — Progress Notes (Signed)
Adult Psychoeducational Group Note  Pt did not attend goals group.

## 2021-09-12 NOTE — Progress Notes (Signed)
[facility] MD Progress Note  09/12/2021 7:42 AM Wesley Peterson  MRN:  [id number]  Chief Complaint: psychosis and SI  Reason for Admission:  Wesley Peterson is a 36 y.o [place] American male with a history of Schizophrenia who presented under IVC to the [facility] for treatment and stabilization of his mood and with complaints of worsening auditory hallucinations, passive SI, HI, & depressive symptoms in the context of medication non compliance and homelessness.  Urine drug screen positive for amphetamines.The patient is currently on Hospital Day 9.   Chart Review from last 24 hours:  The patient's chart was reviewed and nursing notes were reviewed. The patient's case was discussed in multidisciplinary team meeting. Per nursing, he attended 2 groups. He remains guarded on the unit. Per MAR, he refused Protonix but was compliant with other scheduled medications. He receive Tylenol X1 PRN.  Information Obtained Today During Patient Interview: The patient was seen and evaluated on the unit. He is again in bed but states he is getting up for meals, is showering, and did attend some groups yesterday. He states he wants to go back to [place] or possibly to a shelter after discharge. He states he slept well and is feeling bored but less oversedated today. He denies AH since yesterday, denies belief anyone is messing with his penis, denies thought insertion/withdrawal/broadcasting, and denies ideas of reference. He does not make much eye contact and appears guarded on exam but is not grossly responding to internal stimuli. He denies derealization, magical thinking, SI or HI. He denies issues with depression or anxiety. He reports mood is "calm" and he reports good appetite. He voices no physical complaints and denies issues with TD/EPS symptoms.   Principal Problem: Schizophrenia (HCC) Diagnosis: Principal Problem:   Schizophrenia (HCC)  Total Time Spent in Direct Patient Care:  I personally spent 25 minutes on the unit in  direct patient care. The direct patient care time included face-to-face time with the patient, reviewing the patient's chart, communicating with other professionals, and coordinating care. Greater than 50% of this time was spent in counseling or coordinating care with the patient regarding goals of hospitalization, psycho-education, and discharge planning needs.  Past Psychiatric History: see H&P  Past Medical History:  Past Medical History:  Diagnosis Date   Schizophrenia (HCC)    Schizophrenia, paranoid type (HCC) 02/02/2021   Family History: see H&P  Family Psychiatric  History: see H&P  Social History:  Social History   Substance and Sexual Activity  Alcohol Use Not Currently   Alcohol/week: 1.0 standard drink of alcohol   Types: 1 Cans of beer per week   Comment: occasional     Social History   Substance and Sexual Activity  Drug Use Never    Social History   Socioeconomic History   Marital status: Single    Spouse name: Not on file   Number of children: 1   Years of education: Not on file   Highest education level: Not on file  Occupational History   Occupation: Unemployed  Tobacco Use   Smoking status: Never   Smokeless tobacco: Never  Vaping Use   Vaping Use: Never used  Substance and Sexual Activity   Alcohol use: Not Currently    Alcohol/week: 1.0 standard drink of alcohol    Types: 1 Cans of beer per week    Comment: occasional   Drug use: Never   Sexual activity: Never  Other Topics Concern   Not on file  Social History Narrative   **  Merged History Encounter **       Pt lives with mother and other relatives.  Currently unemployed.  [place] outpatient psychiatry services through [facility].   Social Determinants of Health   Financial Resource Strain: Not on file  Food Insecurity: Not on file  Transportation Needs: Not on file  Physical Activity: Not on file  Stress: Not on file  Social Connections: Not on file    Sleep: 8.5  hours  Appetite:  Fair  Current Medications: Current Facility-Administered Medications  Medication Dose Route Frequency Provider Last Rate Last Admin   acetaminophen (TYLENOL) tablet 650 mg  650 mg Oral Q6H PRN Leevy-Johnson, Brooke A, NP   650 mg at 09/11/21 0754   alum & mag hydroxide-simeth (MAALOX/MYLANTA) 200-200-20 MG/5ML suspension 30 mL  30 mL Oral Q4H PRN Leevy-Johnson, Brooke A, NP       amLODipine (NORVASC) tablet 5 mg  5 mg Oral Daily Massengill, Nathan, MD   5 mg at 09/11/21 0754   benztropine (COGENTIN) tablet 0.5 mg  0.5 mg Oral BID PRN Harlow Asa, MD       feeding supplement (ENSURE ENLIVE / ENSURE PLUS) liquid 237 mL  237 mL Oral BID BM Attiah, Nadir, MD   237 mL at 09/11/21 1500   OLANZapine zydis (ZYPREXA) disintegrating tablet 5 mg  5 mg Oral Q8H PRN Harlow Asa, MD       And   LORazepam (ATIVAN) tablet 1 mg  1 mg Oral PRN Harlow Asa, MD       And   ziprasidone (GEODON) injection 20 mg  20 mg Intramuscular PRN Nelda Marseille, Whitfield Dulay E, MD       magnesium hydroxide (MILK OF MAGNESIA) suspension 30 mL  30 mL Oral Daily PRN Leevy-Johnson, Brooke A, NP       melatonin tablet 3 mg  3 mg Oral QHS PRN Nelda Marseille, Chauntelle Azpeitia E, MD       pantoprazole (PROTONIX) EC tablet 40 mg  40 mg Oral Daily Nelda Marseille, Mylee Falin E, MD   40 mg at 09/08/21 0820   senna-docusate (Senokot-S) tablet 1 tablet  1 tablet Oral QHS PRN Harlow Asa, MD       valbenazine [facility]) capsule 40 mg  40 mg Oral Daily Nelda Marseille, Kip Kautzman E, MD   40 mg at 09/11/21 0755   ziprasidone (GEODON) capsule 60 mg  60 mg Oral BID WC Nelda Marseille, Sidra Oldfield E, MD   60 mg at 09/11/21 1611    Lab Results: No results found for this or any previous visit (from the past 37 hour(s)).   Blood Alcohol level:  Lab Results  Component Value Date   ETH <10 08/31/2021   ETH <10 [id number]    Metabolic Disorder Labs: Lab Results  Component Value Date   HGBA1C 5.9 (H) 06/02/2021   MPG 122.63 06/02/2021   MPG 126 02/03/2021   Lab  Results  Component Value Date   PROLACTIN 19.9 (H) 09/06/2021   PROLACTIN 17.5 (H) 06/02/2021   Lab Results  Component Value Date   CHOL 137 06/02/2021   TRIG 105 06/02/2021   HDL 36 (L) 06/02/2021   CHOLHDL 3.8 06/02/2021   VLDL 21 06/02/2021   LDLCALC 80 06/02/2021   LDLCALC 80 01/30/2021    Physical Findings:  Musculoskeletal: Strength & Muscle Tone: untested in bed Gait & Station: untested in bed Patient leans: N/A  Psychiatric Specialty Exam:  Presentation  General Appearance: Casually dressed, fair hygiene  Eye Contact:Minimal  Speech:some speech latency -  answers questions with short phrases with direct questions  Speech Volume:Decreased  Mood and Affect  Mood:Aloof, suspicious  Affect:restricted, guarded   Thought Process  Thought Processes:Linear, concrete  Orientation:Oriented to city, month, Software engineer, and year   Thought Content:  some speech latency at times on exam and appears guarded but is not grossly responding to internal stimuli on exam; denies AVH, ideas of reference, or thought insertion/withdrawal/broadcasting; denies delusional thinking, magical thinking, or paranoia today but guarded behavior and poor eye contact are concerning for some residual internal preoccupation  History of Schizophrenia/Schizoaffective disorder:Yes  Duration of Psychotic Symptoms:Greater than six months  Hallucinations:Denied  Ideas of Reference:Denied  Suicidal Thoughts:Denied  Homicidal Thoughts:Denied   Sensorium  Memory:Fair  Judgment:Impaired  Insight:Shallow   Executive Functions  Concentration:Fair  Attention Span:Fair  Recall:Not formally tested  [employer] of Knowledge:Fair  Language:Fair   Psychomotor Activity  Psychomotor Activity: No lateral jaw movement with activation/distraction today; no akathisias or tremors noted  Assets  Assets:Physical Health; Communication Skills  Physical Exam Vitals and nursing note reviewed.   Constitutional:      Appearance: Normal appearance.  Pulmonary:     Effort: Pulmonary effort is normal. No respiratory distress.  Neurological:     Mental Status: He is alert.    Review of Systems  Respiratory:  Negative for shortness of breath.   Cardiovascular:  Negative for chest pain.  Gastrointestinal:  Negative for constipation, diarrhea, heartburn, nausea and vomiting.  Neurological:  Negative for dizziness and headaches.   Blood pressure (!) 115/92, pulse 88, temperature 98 F (36.7 C), temperature source Oral, resp. rate 16, height 6\' 1"  (1.854 m), weight 66.2 kg, SpO2 98 %. Body mass index is 19.26 kg/m.   Treatment Plan Summary: Daily contact with patient to assess and evaluate symptoms and progress in treatment  Assessment: Schizophrenia by hx TD  PLAN: Safety and Monitoring:  -- Involuntary admission to inpatient psychiatric unit for safety, stabilization and treatment  -- Daily contact with patient to assess and evaluate symptoms and progress in treatment  -- Patient's case to be discussed in multi-disciplinary team meeting  -- Observation Level : q15 minute checks  -- Vital signs:  q12 hours  -- Precautions: suicide, elopement, and assault  -- Placed on room lock out for meals and groups   2. Psychiatric Diagnoses and Treatment:   Schizophrenia by hx  -- Continue Geodon 60mg  bid for residual psychosis - may need continued dose titration over coming days   -- Metabolic profile and EKG monitoring obtained while on an atypical antipsychotic (Lipid Panel: WNL other than HDL 36 on 5/25; HbgA1c: 5.9 on 5/25; QTc:495ms on 9/4)   -- Encouraged patient to participate in unit milieu and in scheduled group therapies   -- Asked SW to see if he qualifies for ACTT referral  -- Discontinued trazodone and has Melatonin PRN for insomnia     UDS positive for amphetamines  -- PDMP reviewed and patient received Adderall 5mg  bid #60 tabs 08/29/21   -- PRNS available for  possible withdrawal   -- will encouraged not to restart given psychosis history     3. Medical Issues Being Addressed:   HTN  -- Continue Norvasc 5mg  daily (BPS 119/84 and 125/93 today)   Mildly elevated Prolactin level   -- level 19.9 and ws 17.5 3 months ago and 23.4 2 years ago - continue to monitor while on antipsychotic - currently asymptomatic   Tardive Dyskinesia -- Previously on Ingrezza - continue 40mg  daily for  lateral jaw movement and observe  EKG changes - resolved  -- cannot rule out age undetermined inferior infarct on EKG 8/26 -likely due to lead placement- patient asymptomatic and not noted on serial EKGS since  -- repeat EKG on 9/4 shows NSR with minimal voltage criteria for LVH QTC  Constipation - resolved -- Change Senokot to PRN   GERD -- Continue Protonix 40mg  daily - patient currently refusing   4. Discharge Planning:   -- Social work and case management to assist with discharge planning and identification of hospital follow-up needs prior to discharge  -- Discharge Concerns: Need to establish a safety plan; Medication compliance and effectiveness  -- Discharge Goals: Return home with outpatient referrals for mental health follow-up including medication management/psychotherapy  , MD, FAPA 09/12/2021, 7:42 AM

## 2021-09-13 NOTE — Progress Notes (Signed)
[facility] MD Progress Note  09/13/2021 12:39 PM Wesley Peterson  MRN:  [id number]  Chief Complaint: psychosis and SI  Reason for Admission:  Wesley Peterson is a 36 y.o [place] American male with a history of Schizophrenia who presented under IVC to the [facility] for treatment and stabilization of his mood and with complaints of worsening auditory hallucinations, passive SI, HI, & depressive symptoms in the context of medication non compliance and homelessness.  Urine drug screen positive for amphetamines.The patient is currently on Hospital Day 10.   Chart Review from last 24 hours:  The patient's chart was reviewed and nursing notes were reviewed. The patient's case was discussed in multidisciplinary team meeting. Per nursing, he attended some groups yesterday with encouragement. He remains secluded on the unit. Per MAR, he is compliant with scheduled medications including psychotropic medications, no as needed medications needed or given for agitation or aggression.    Information Obtained Today During Patient Interview: The patient was seen and evaluated in his room, he is lying down calmly presents alert and oriented to place and situation telling me that the reason he came to the hospital "I was hearing voices" he does admit to diagnosis of mental illness and noncompliance with medications outside the hospital, reports since he has been here he has been better and denies any auditory hallucinations for the last 2 to 3 days, denies SI HI or AVH, denies visual hallucinations, describes his mood as "calm" denies depressed mood denies paranoia towards staff or peers.  In fact when I discussed with him some of the paranoid statement and delusions he noted upon admission he smiles and responds "that was my hallucinations my mind was playing tricks on me"  he does agree to attend groups today and I discussed with him that if he does not attend groups will be forced to lock his room to encourage group attendance, will  follow.  He does not display any sign consistent with EPS or TD  Principal Problem: Schizophrenia (HCC) Diagnosis: Principal Problem:   Schizophrenia (HCC)   Past Psychiatric History: see H&P  Past Medical History:  Past Medical History:  Diagnosis Date   Schizophrenia (HCC)    Schizophrenia, paranoid type (HCC) 02/02/2021   Family History: see H&P  Family Psychiatric  History: see H&P  Social History:  Social History   Substance and Sexual Activity  Alcohol Use Not Currently   Alcohol/week: 1.0 standard drink of alcohol   Types: 1 Cans of beer per week   Comment: occasional     Social History   Substance and Sexual Activity  Drug Use Never    Social History   Socioeconomic History   Marital status: Single    Spouse name: Not on file   Number of children: 1   Years of education: Not on file   Highest education level: Not on file  Occupational History   Occupation: Unemployed  Tobacco Use   Smoking status: Never   Smokeless tobacco: Never  Vaping Use   Vaping Use: Never used  Substance and Sexual Activity   Alcohol use: Not Currently    Alcohol/week: 1.0 standard drink of alcohol    Types: 1 Cans of beer per week    Comment: occasional   Drug use: Never   Sexual activity: Never  Other Topics Concern   Not on file  Social History Narrative   ** Merged History Encounter **       Pt lives with mother and other relatives.  Currently unemployed.  Receives outpatient psychiatry services through [facility].   Social Determinants of Health   Financial Resource Strain: Not on file  Food Insecurity: Not on file  Transportation Needs: Not on file  Physical Activity: Not on file  Stress: Not on file  Social Connections: Not on file    Sleep: 8.5 hours  Appetite:  Fair  Current Medications: Current Facility-Administered Medications  Medication Dose Route Frequency Provider Last Rate Last Admin   acetaminophen (TYLENOL) tablet 650 mg  650 mg Oral Q6H PRN  Leevy-Johnson, Brooke A, NP   650 mg at 09/13/21 0829   alum & mag hydroxide-simeth (MAALOX/MYLANTA) 200-200-20 MG/5ML suspension 30 mL  30 mL Oral Q4H PRN Leevy-Johnson, Brooke A, NP       amLODipine (NORVASC) tablet 5 mg  5 mg Oral Daily Massengill, Nathan, MD   5 mg at 09/13/21 0827   benztropine (COGENTIN) tablet 0.5 mg  0.5 mg Oral BID PRN Comer Locket, MD       feeding supplement (ENSURE ENLIVE / ENSURE PLUS) liquid 237 mL  237 mL Oral BID BM Kahmari Herard, MD   237 mL at 09/13/21 0928   OLANZapine zydis (ZYPREXA) disintegrating tablet 5 mg  5 mg Oral Q8H PRN Comer Locket, MD       And   LORazepam (ATIVAN) tablet 1 mg  1 mg Oral PRN Comer Locket, MD       And   ziprasidone (GEODON) injection 20 mg  20 mg Intramuscular PRN Mason Jim, Amy E, MD       magnesium hydroxide (MILK OF MAGNESIA) suspension 30 mL  30 mL Oral Daily PRN Leevy-Johnson, Brooke A, NP       melatonin tablet 3 mg  3 mg Oral QHS PRN Mason Jim, Amy E, MD       pantoprazole (PROTONIX) EC tablet 40 mg  40 mg Oral Daily Mason Jim, Amy E, MD   40 mg at 09/13/21 8295   senna-docusate (Senokot-S) tablet 1 tablet  1 tablet Oral QHS PRN Comer Locket, MD       valbenazine [facility]) capsule 40 mg  40 mg Oral Daily Mason Jim, Amy E, MD   40 mg at 09/13/21 0827   ziprasidone (GEODON) capsule 60 mg  60 mg Oral BID WC Mason Jim, Amy E, MD   60 mg at 09/13/21 0827    Lab Results: No results found for this or any previous visit (from the past 48 hour(s)).   Blood Alcohol level:  Lab Results  Component Value Date   ETH <10 08/31/2021   ETH <10 07/21/2021    Metabolic Disorder Labs: Lab Results  Component Value Date   HGBA1C 5.9 (H) 06/02/2021   MPG 122.63 06/02/2021   MPG 126 02/03/2021   Lab Results  Component Value Date   PROLACTIN 19.9 (H) 09/06/2021   PROLACTIN 17.5 (H) 06/02/2021   Lab Results  Component Value Date   CHOL 137 06/02/2021   TRIG 105 06/02/2021   HDL 36 (L) 06/02/2021   CHOLHDL 3.8  06/02/2021   VLDL 21 06/02/2021   LDLCALC 80 06/02/2021   LDLCALC 80 01/30/2021    Physical Findings:  Musculoskeletal: Strength & Muscle Tone: untested in bed Gait & Station: untested in bed Patient leans: N/A  Psychiatric Specialty Exam:  Presentation  General Appearance: Casually dressed, fair hygiene  Eye Contact:Minimal  Speech: Decreased amount, normal tone and volume  Mood and Affect  Mood: "Calm"  Affect:restricted, guarded   Thought Process  Thought Processes:Linear, concrete  Orientation:Oriented to city, month, Economist, and year   Thought Content: Denies SI HI or AVH does not present responding to stimuli but with restricted to flat affect, some guardedness noted but no paranoia.  History of Schizophrenia/Schizoaffective disorder:Yes  Duration of Psychotic Symptoms:Greater than six months  Hallucinations:Denied  Ideas of Reference:Denied  Suicidal Thoughts:Denied  Homicidal Thoughts:Denied   Sensorium  Memory:Fair  Judgment: Improved yet limited Insight: Limited  Executive Functions  Concentration:Fair  Attention Span:Fair  Recall:Not formally tested  [employer] of Knowledge:Fair  Language:Fair   Psychomotor Activity  Psychomotor Activity: No lateral jaw movement with activation/distraction today; no akathisias or tremors noted  Assets  Assets:Physical Health; Communication Skills  Physical Exam Vitals and nursing note reviewed.  Constitutional:      Appearance: Normal appearance.  Pulmonary:     Effort: Pulmonary effort is normal. No respiratory distress.  Neurological:     Mental Status: He is alert.    Review of Systems  Respiratory:  Negative for shortness of breath.   Cardiovascular:  Negative for chest pain.  Gastrointestinal:  Negative for constipation, diarrhea, heartburn, nausea and vomiting.  Neurological:  Negative for dizziness and headaches.   Blood pressure (!) 125/93, pulse 85, temperature 98.2 F (36.8 C),  temperature source Oral, resp. rate 16, height 6\' 1"  (1.854 m), weight 66.2 kg, SpO2 100 %. Body mass index is 19.26 kg/m.   Treatment Plan Summary: Daily contact with patient to assess and evaluate symptoms and progress in treatment  Assessment: Schizophrenia by hx TD  PLAN: Safety and Monitoring:  -- Involuntary admission to inpatient psychiatric unit for safety, stabilization and treatment  -- Daily contact with patient to assess and evaluate symptoms and progress in treatment  -- Patient's case to be discussed in multi-disciplinary team meeting  -- Observation Level : q15 minute checks  -- Vital signs:  q12 hours  -- Precautions: suicide, elopement, and assault  -- Place on room lock out for meals and groups to encourage participation in the milieu and attending groups  2. Psychiatric Diagnoses and Treatment:   Schizophrenia by hx  -- Continue Geodon 60mg  bid for residual psychosis   -- Metabolic profile and EKG monitoring obtained while on an atypical antipsychotic (Lipid Panel: WNL other than HDL 36 on 5/25; HbgA1c: 5.9 on 5/25; QTc:458ms on 9/4)   -- Encouraged patient to participate in unit milieu and in scheduled group therapies   -- Asked SW to see if he qualifies for ACTT referral  -- Discontinued trazodone and has Melatonin PRN for insomnia     UDS positive for amphetamines  -- PDMP reviewed and patient received Adderall 5mg  bid #60 tabs 08/29/21   -- PRNS available for possible withdrawal   -- will encouraged not to restart given psychosis history     3. Medical Issues Being Addressed:   HTN  -- Continue Norvasc 5mg  daily (BPS 119/84 and 125/93 today)   Mildly elevated Prolactin level   -- level 19.9 and ws 17.5 3 months ago and 23.4 2 years ago - continue to monitor while on antipsychotic - currently asymptomatic   Tardive Dyskinesia -- Previously on Ingrezza - continue 40mg  daily   Constipation - resolved -- Change Senokot to PRN   GERD -- Continue  Protonix 40mg  daily  4. Discharge Planning:   -- Social work and case management to assist with discharge planning and identification of hospital follow-up needs prior to discharge  -- Discharge Concerns: Need to establish a safety plan;  Medication compliance and effectiveness  -- Discharge Goals: Return home with outpatient referrals for mental health follow-up including medication management/psychotherapy  Emiline Mancebo Abbott Pao, MD, 09/13/2021, 12:39 PM

## 2021-09-13 NOTE — Group Note (Signed)
Recreation Therapy Group Note   Group Topic:Team Building  Group Date: 09/13/2021 Start Time: 1000 End Time: 1030 Facilitators: Caroll Rancher, LRT,CTRS Location: [address] Dayroom   Goal Area(s) Addresses:  Patient will effectively work with peer towards shared goal.  Patient will identify skills used to make activity successful.  Patient will identify how skills used during activity can be applied to reach post d/c goals.    Group Description: [employer]. In teams of 5-6, patients were given 11 craft pipe cleaners. Using the materials provided, patients were instructed to compete again the opposing team(s) to build the tallest free-standing structure from floor level. The activity was timed; difficulty increased by Clinical research associate as Production designer, theatre/television/film continued.  Systematically resources were removed with additional directions for example, placing one arm behind their back, working in silence, and shape stipulations. LRT facilitated post-activity discussion reviewing team processes and necessary communication skills involved in completion. Patients were encouraged to reflect how the skills utilized, or not utilized, in this activity can be incorporated to positively impact support systems post discharge.   Affect/Mood: Appropriate   Participation Level: Engaged   Participation Quality: Independent   Behavior: Appropriate   Speech/Thought Process: Focused   Insight: Good   Judgement: Good   Modes of Intervention: STEM Activity   Patient Response to Interventions:  Engaged   Education Outcome:  Acknowledges education and In group clarification offered    Clinical Observations/Individualized Feedback: Pt was bright and focused on the activity.  Pt followed the lead of his peer.  Pt expressed the group used "following the leader" as one of the skills for the activity.  To which it was elaborated that there does need to be a leader when dealing with a support system.      Plan: Continue to engage patient in RT group sessions 2-3x/week.   Caroll Rancher, LRT,CTRS 09/13/2021 12:10 PM

## 2021-09-13 NOTE — Group Note (Signed)
Date:  09/13/2021 Time:  9:41 AM  Group Topic/Focus:  Orientation:   The focus of this group is to educate the patient on the purpose and policies of crisis stabilization and provide a format to answer questions about their admission.  The group details unit policies and expectations of patients while admitted.    Participation Level:  Active  Participation Quality:  Attentive  Affect:  Appropriate  Cognitive:  Oriented  Insight: Good  Engagement in Group:  Engaged  Modes of Intervention:  Discussion  Additional Comments:     Reymundo Poll 09/13/2021, 9:41 AM

## 2021-09-13 NOTE — BHH Counselor (Signed)
CSW spoke with the Pt's [facility], [place] [phone] who states that he would like the Pt to be referred to an ACT Team.  Mr. Jeanella Craze believes that an ACT Team would benefit the Pt medically and would be able to provide the Pt with additional housing resources.  CSW will speak with the Pt to verify that he is interested in that service.  If so, CSW will place an ACT Team referral prior to the Pt's discharge.

## 2021-09-13 NOTE — Progress Notes (Signed)
Adult Psychoeducational Group Note  Date:  09/13/2021 Time:  10:30 PM  Group Topic/Focus:  Wrap-Up Group:   The focus of this group is to help patients review their daily goal of treatment and discuss progress on daily workbooks.  Participation Level:  Active  Participation Quality:  Appropriate  Affect:  Appropriate  Cognitive:  Appropriate  Insight: Appropriate  Engagement in Group:  Engaged  Modes of Intervention:  Discussion  Additional Comments:   Pt attended and participated in the Wrap-Up group. Pt denies SI/HI. Pt day/mood 8/10. Pt made some progress toward discharge.reports speaking with the doctor and social worker regarding discharge. Pt identified making music as a coping skill.  Wesley Peterson 09/13/2021, 10:30 PM

## 2021-09-14 ENCOUNTER — Encounter (HOSPITAL_COMMUNITY): Payer: Self-pay

## 2021-09-14 MED ORDER — ZIPRASIDONE HCL 60 MG PO CAPS
60.0000 mg | ORAL_CAPSULE | Freq: Two times a day (BID) | ORAL | 0 refills | Status: DC
Start: 2021-09-14 — End: 2021-11-04

## 2021-09-14 MED ORDER — MELATONIN 3 MG PO TABS: 3.0000 mg | ORAL_TABLET | Freq: Every evening | ORAL | 0 refills | Status: DC | PRN

## 2021-09-14 MED ORDER — PANTOPRAZOLE SODIUM 40 MG PO TBEC: 40.0000 mg | DELAYED_RELEASE_TABLET | Freq: Every day | ORAL | 0 refills | Status: DC

## 2021-09-14 MED ORDER — AMLODIPINE BESYLATE 5 MG PO TABS: 5.0000 mg | ORAL_TABLET | Freq: Every day | ORAL | 0 refills | Status: DC

## 2021-09-14 MED ORDER — VALBENAZINE TOSYLATE 40 MG PO CAPS
40.0000 mg | ORAL_CAPSULE | Freq: Every day | ORAL | 0 refills | Status: DC
Start: 2021-09-15 — End: 2021-11-04

## 2021-09-14 NOTE — Progress Notes (Signed)
     09/14/21 2100  Psych Admission Type (Psych Patients Only)  Admission Status Involuntary  Psychosocial Assessment  Patient Complaints None  Eye Contact Fair  Facial Expression Animated  Affect Appropriate to circumstance  Speech Soft;Logical/coherent  Interaction Cautious;Guarded;Forwards little  Motor Activity Slow  Appearance/Hygiene Unremarkable  Behavior Characteristics Cooperative  Mood Pleasant  Thought Process  Coherency Circumstantial  Content Preoccupation  Delusions None reported or observed  Perception WDL  Hallucination None reported or observed  Judgment Limited  Confusion None  Danger to Self  Current suicidal ideation? Denies  Agreement Not to Harm Self Yes  Description of Agreement verbal  Danger to Others  Danger to Others None reported or observed  Danger to Others Abnormal  Harmful Behavior to others No threats or harm toward other people  Destructive Behavior No threats or harm toward property

## 2021-09-14 NOTE — Group Note (Signed)
LCSW Group Therapy Note   Group Date: 09/14/2021 Start Time: 1300 End Time: 1400  Type of Therapy and Topic:  Group Therapy - Healthy vs Unhealthy Coping Skills  Participation Level:  Active   Description of Group The focus of this group was to determine what unhealthy coping techniques typically are used by group members and what healthy coping techniques would be helpful in coping with various problems. Patients were guided in becoming aware of the differences between healthy and unhealthy coping techniques. Patients were asked to identify 2-3 healthy coping skills they would like to learn to use more effectively.  Therapeutic Goals Patients learned that coping is what human beings do all day long to deal with various situations in their lives Patients defined and discussed healthy vs unhealthy coping techniques Patients identified their preferred coping techniques and identified whether these were healthy or unhealthy Patients determined 2-3 healthy coping skills they would like to become more familiar with and use more often. Patients provided support and ideas to each other   Summary of Patient Progress:  During group, the Pt expressed that writing music is a helpful coping skill for them. Patient proved open to input from peers and feedback from CSW. Patient demonstrated insight into the subject matter, was respectful of peers, and participated throughout the entire session.   Therapeutic Modalities Cognitive Behavioral Therapy Motivational Interviewing  Aram Beecham, [place] 09/14/2021  1:51 PM

## 2021-09-14 NOTE — Progress Notes (Signed)
Pt is logical, can hold linear conversations, denies AVH, paranoia and is not delusional at this time. Visible in milieu at intervals during shift for scheduled groups and meals. He presents guarded, cautious but forwards on interactions. Denies SI, HI, AVH and pain when assessed. Remains medication compliant without adverse drug reactions. Pt does exhibit insight related to his mental health symptoms / illness as he recognized that his "body parts been stolen" prior to admission "Before I got here but I know that's not real now" with a laughter at medication window. Reports his medications have been effective in clearing up his psychosis thus far. Continued support and reassurance offered to pt and concerns validated. Verbal education provided on all medications and effects monitored. Q 15 minutes checks maintained without outburst or self harm gestures. Pt tolerates meals and fluids well. Safety maintained.

## 2021-09-14 NOTE — Progress Notes (Addendum)
[facility] MD Progress Note  09/14/2021 10:12 AM Ritik Stavola  MRN:  [id number]  Chief Complaint: psychosis and SI  Reason for Admission:  Adrienne Delay is a 36 y.o [place] American male with a history of Schizophrenia who presented under IVC to the [facility] for treatment and stabilization of his mood and with complaints of worsening auditory hallucinations, passive SI, HI, & depressive symptoms in the context of medication non compliance and homelessness.  Urine drug screen positive for amphetamines.The patient is currently on Hospital Day 11.   Chart Review from last 24 hours:  The patient's chart was reviewed and nursing notes were reviewed. The patient's case was discussed in multidisciplinary team meeting. Per nursing, he is attending groups with encouragement, yesterday was reported to be more engaged with the staff conversation in a linear manner with no psychosis or agitation reported.  Per MAR, he is compliant with scheduled medications including psychotropic medications, no as needed medications needed or given for agitation or aggression.    Information Obtained Today During Patient Interview: The patient was seen and evaluated in his room, he is lying down calmly presents alert and oriented to place and situation, continues to be able to answer questions in a linear manner with no psychosis or delusions noted, does not appear to be overtly responding to stimuli.  He reports good sleep and appetite, reports attended some groups yesterday and able to discuss some coping skills with the stressors, continues to deny SI HI or AVH, denies depressed mood and describes his mood as "calm" he reports he is able to think straight and continues to smile when I remind him of delusions he reported at time of admission "yes that was the hallucinations I was talking about" he is able to endorse benefit of being on medications during the hospital stay helping with his mental illness, denies side effect medications and  agrees to comply with medications and follow-up appointments after discharge.  He reports he spoke with his mother in [place] yesterday and he plans to go back to [place] after discharge to stay with his uncle but he does not have his phone number and plans to go to the shelter in [place] if he cannot go stay with his uncle.  If continues to remain stable will discharge home tomorrow morning. I attempted to contact patient's mother at [phone] for collateral information and assess from her insight if she agrees patient is back to his baseline and discussed discharge plan but no response.  Patient agreed to me verbally for staff to contact his mother for safety planning prior to discharge.  Addendum: I was able to contact patient's mother Mrs. [name] who confirmed that patient spoke with her over the phone yesterday and the phone call went well, based on the conversation over the phone she confirmed that patient is back to baseline and denies any known risk for him harming himself or others if discharged home tomorrow morning.  She confirms patient has a place to stay in [place] where he stays by himself and also she confirmed he can stay with his uncle in [place] as well but she does not have a phone number to confirm.  ROS: 9 other systems reviewed negative Principal Problem: Schizophrenia (HCC) Diagnosis: Principal Problem:   Schizophrenia (HCC)   Past Psychiatric History: see H&P  Past Medical History:  Past Medical History:  Diagnosis Date   Schizophrenia (HCC)    Schizophrenia, paranoid type (HCC) 02/02/2021   Family History: see H&P  Family Psychiatric  History: see H&P  Social History:  Social History   Substance and Sexual Activity  Alcohol Use Not Currently   Alcohol/week: 1.0 standard drink of alcohol   Types: 1 Cans of beer per week   Comment: occasional     Social History   Substance and Sexual Activity  Drug Use Never    Social History   Socioeconomic History   Marital  status: Single    Spouse name: Not on file   Number of children: 1   Years of education: Not on file   Highest education level: Not on file  Occupational History   Occupation: Unemployed  Tobacco Use   Smoking status: Never   Smokeless tobacco: Never  Vaping Use   Vaping Use: Never used  Substance and Sexual Activity   Alcohol use: Not Currently    Alcohol/week: 1.0 standard drink of alcohol    Types: 1 Cans of beer per week    Comment: occasional   Drug use: Never   Sexual activity: Never  Other Topics Concern   Not on file  Social History Narrative   ** Merged History Encounter **       Pt lives with mother and other relatives.  Currently unemployed.  Receives outpatient psychiatry services through [facility].   Social Determinants of Health   Financial Resource Strain: Not on file  Food Insecurity: Not on file  Transportation Needs: Not on file  Physical Activity: Not on file  Stress: Not on file  Social Connections: Not on file    Sleep: 8.5 hours  Appetite:  Fair  Current Medications: Current Facility-Administered Medications  Medication Dose Route Frequency Provider Last Rate Last Admin   acetaminophen (TYLENOL) tablet 650 mg  650 mg Oral Q6H PRN Leevy-Johnson, Brooke A, NP   650 mg at 09/14/21 0718   alum & mag hydroxide-simeth (MAALOX/MYLANTA) 200-200-20 MG/5ML suspension 30 mL  30 mL Oral Q4H PRN Leevy-Johnson, Brooke A, NP       amLODipine (NORVASC) tablet 5 mg  5 mg Oral Daily Massengill, Nathan, MD   5 mg at 09/14/21 6644   benztropine (COGENTIN) tablet 0.5 mg  0.5 mg Oral BID PRN Comer Locket, MD       feeding supplement (ENSURE ENLIVE / ENSURE PLUS) liquid 237 mL  237 mL Oral BID BM Abdoulaye Drum, MD   237 mL at 09/13/21 1423   OLANZapine zydis (ZYPREXA) disintegrating tablet 5 mg  5 mg Oral Q8H PRN Comer Locket, MD       And   LORazepam (ATIVAN) tablet 1 mg  1 mg Oral PRN Comer Locket, MD       And   ziprasidone (GEODON) injection 20 mg  20  mg Intramuscular PRN Mason Jim, Amy E, MD       magnesium hydroxide (MILK OF MAGNESIA) suspension 30 mL  30 mL Oral Daily PRN Leevy-Johnson, Brooke A, NP       melatonin tablet 3 mg  3 mg Oral QHS PRN Mason Jim, Amy E, MD   3 mg at 09/13/21 2056   pantoprazole (PROTONIX) EC tablet 40 mg  40 mg Oral Daily Mason Jim, Amy E, MD   40 mg at 09/14/21 0831   senna-docusate (Senokot-S) tablet 1 tablet  1 tablet Oral QHS PRN Comer Locket, MD       valbenazine [facility]) capsule 40 mg  40 mg Oral Daily Mason Jim, Amy E, MD   40 mg at 09/14/21 0831   ziprasidone (  GEODON) capsule 60 mg  60 mg Oral BID WC Viann Fish E, MD   60 mg at 09/14/21 0831    Lab Results: No results found for this or any previous visit (from the past 48 hour(s)).   Blood Alcohol level:  Lab Results  Component Value Date   ETH <10 08/31/2021   ETH <10 [id number]    Metabolic Disorder Labs: Lab Results  Component Value Date   HGBA1C 5.9 (H) 06/02/2021   MPG 122.63 06/02/2021   MPG 126 02/03/2021   Lab Results  Component Value Date   PROLACTIN 19.9 (H) 09/06/2021   PROLACTIN 17.5 (H) 06/02/2021   Lab Results  Component Value Date   CHOL 137 06/02/2021   TRIG 105 06/02/2021   HDL 36 (L) 06/02/2021   CHOLHDL 3.8 06/02/2021   VLDL 21 06/02/2021   LDLCALC 80 06/02/2021   LDLCALC 80 01/30/2021    Physical Findings:  Musculoskeletal: Strength & Muscle Tone: untested in bed Gait & Station: Steady Patient leans: N/A  Psychiatric Specialty Exam:  Presentation  General Appearance: Casually dressed, fair hygiene  Eye Contact:Minimal but improved in general  Speech: Decreased amount, normal tone and volume  Mood and Affect  Mood: "Calm"  Affect:restricted   Thought Process  Thought Processes:Linear, concrete  Orientation:Oriented to city, month, Software engineer, and year, oriented to situation, person and place  Thought Content: Denies SI HI or AVH does not present responding to stimuli but with  restricted affect, some guardedness noted but no paranoia or other delusions noted.  History of Schizophrenia/Schizoaffective disorder:Yes  Duration of Psychotic Symptoms:Greater than six months  Hallucinations:Denied  Ideas of Reference:Denied  Suicidal Thoughts:Denied  Homicidal Thoughts:Denied   Sensorium  Memory:Fair  Judgment: Improved yet limited Insight: Improved  Executive Functions  Concentration:Fair  Attention Span:Fair  Recall:Not formally tested  [employer] of Knowledge:Fair  Language:Fair   Psychomotor Activity  Psychomotor Activity: No lateral jaw movement with activation/distraction today; no akathisias or tremors noted  Assets  Assets:Physical Health; Communication Skills  Physical Exam Vitals and nursing note reviewed.  Constitutional:      Appearance: Normal appearance.  Pulmonary:     Effort: Pulmonary effort is normal. No respiratory distress.  Neurological:     Mental Status: He is alert.     Blood pressure (!) 126/92, pulse 83, temperature 98.4 F (36.9 C), temperature source Oral, resp. rate 16, height 6\' 1"  (1.854 m), weight 66.2 kg, SpO2 97 %. Body mass index is 19.26 kg/m.   Treatment Plan Summary: Daily contact with patient to assess and evaluate symptoms and progress in treatment  Assessment: Schizophrenia by hx TD  PLAN: Safety and Monitoring:  -- Involuntary admission to inpatient psychiatric unit for safety, stabilization and treatment  -- Daily contact with patient to assess and evaluate symptoms and progress in treatment  -- Patient's case to be discussed in multi-disciplinary team meeting  -- Observation Level : q15 minute checks  -- Vital signs:  q12 hours  -- Precautions: suicide, elopement, and assault    2. Psychiatric Diagnoses and Treatment:   Schizophrenia by hx  -- Continue Geodon 60mg  bid for residual psychosis   -- Metabolic profile and EKG monitoring obtained while on an atypical antipsychotic (Lipid  Panel: WNL other than HDL 36 on 5/25; HbgA1c: 5.9 on 5/25; QTc:476ms on 9/4)   -- Encouraged patient to participate in unit milieu and in scheduled group therapies   -- Asked SW to see if he qualifies for ACTT referral  -- Discontinued trazodone  and has Melatonin PRN for insomnia     UDS positive for amphetamines  -- PDMP reviewed and patient received Adderall 5mg  bid #60 tabs 08/29/21   -- PRNS available for possible withdrawal   -- will encouraged not to restart given psychosis history     3. Medical Issues Being Addressed:   HTN  -- Continue Norvasc 5mg  daily (BPS 119/84 and 125/93 today)   Mildly elevated Prolactin level   -- level 19.9 and ws 17.5 3 months ago and 23.4 2 years ago - continue to monitor while on antipsychotic - currently asymptomatic   Tardive Dyskinesia -- Previously on Ingrezza - continue 40mg  daily   Constipation - resolved -- Change Senokot to PRN   GERD -- Continue Protonix 40mg  daily  4. Discharge Planning:   -- Social work and case management to assist with discharge planning and identification of hospital follow-up needs prior to discharge  -- Discharge Concerns: Need to establish a safety plan; Medication compliance and effectiveness  -- Discharge Goals: Return home with outpatient referrals for mental health follow-up including medication management/psychotherapy  Sanayah Munro Winfred Leeds, MD, 09/14/2021, 10:12 AM

## 2021-09-14 NOTE — Plan of Care (Signed)
  Problem: Education: Goal: Emotional status will improve Outcome: Progressing Goal: Mental status will improve Outcome: Progressing   

## 2021-09-14 NOTE — BH IP Treatment Plan (Signed)
Interdisciplinary Treatment and Diagnostic Plan Update  09/14/2021 Time of Session: 9:45am Wesley Peterson MRN: [id number]  Principal Diagnosis: Schizophrenia [facility])  Secondary Diagnoses: Principal Problem:   Schizophrenia (HCC)   Current Medications:  Current Facility-Administered Medications  Medication Dose Route Frequency Provider Last Rate Last Admin   acetaminophen (TYLENOL) tablet 650 mg  650 mg Oral Q6H PRN Leevy-Johnson, Brooke A, NP   650 mg at 09/14/21 0718   alum & mag hydroxide-simeth (MAALOX/MYLANTA) 200-200-20 MG/5ML suspension 30 mL  30 mL Oral Q4H PRN Leevy-Johnson, Brooke A, NP       amLODipine (NORVASC) tablet 5 mg  5 mg Oral Daily Massengill, Nathan, MD   5 mg at 09/14/21 7371   benztropine (COGENTIN) tablet 0.5 mg  0.5 mg Oral BID PRN Comer Locket, MD       feeding supplement (ENSURE ENLIVE / ENSURE PLUS) liquid 237 mL  237 mL Oral BID BM Attiah, Nadir, MD   237 mL at 09/13/21 1423   OLANZapine zydis (ZYPREXA) disintegrating tablet 5 mg  5 mg Oral Q8H PRN Comer Locket, MD       And   LORazepam (ATIVAN) tablet 1 mg  1 mg Oral PRN Comer Locket, MD       And   ziprasidone (GEODON) injection 20 mg  20 mg Intramuscular PRN Mason Jim, Amy E, MD       magnesium hydroxide (MILK OF MAGNESIA) suspension 30 mL  30 mL Oral Daily PRN Leevy-Johnson, Brooke A, NP       melatonin tablet 3 mg  3 mg Oral QHS PRN Mason Jim, Amy E, MD   3 mg at 09/13/21 2056   pantoprazole (PROTONIX) EC tablet 40 mg  40 mg Oral Daily Mason Jim, Amy E, MD   40 mg at 09/14/21 0831   senna-docusate (Senokot-S) tablet 1 tablet  1 tablet Oral QHS PRN Comer Locket, MD       valbenazine [facility]) capsule 40 mg  40 mg Oral Daily Mason Jim, Amy E, MD   40 mg at 09/14/21 0831   ziprasidone (GEODON) capsule 60 mg  60 mg Oral BID WC Mason Jim, Amy E, MD   60 mg at 09/14/21 0831   PTA Medications: Medications Prior to Admission  Medication Sig Dispense Refill Last Dose   amphetamine-dextroamphetamine  (ADDERALL) 5 MG tablet Take 1 tablet by mouth 2 (two) times daily.   Past Week   benztropine (COGENTIN) 1 MG tablet Take 1 tablet (1 mg total) by mouth at bedtime. 30 tablet 0     Patient Stressors: Financial difficulties   Marital or family conflict   Medication change or noncompliance    Patient Strengths: General fund of knowledge   Treatment Modalities: Medication Management, Group therapy, Case management,  1 to 1 session with clinician, Psychoeducation, Recreational therapy.   Physician Treatment Plan for Primary Diagnosis: Schizophrenia (HCC) Long Term Goal(s): Improvement in symptoms so as ready for discharge   Short Term Goals: Ability to identify changes in lifestyle to reduce recurrence of condition will improve Ability to verbalize feelings will improve Ability to disclose and discuss suicidal ideas Ability to demonstrate self-control will improve Ability to identify and develop effective coping behaviors will improve Ability to maintain clinical measurements within normal limits will improve Compliance with prescribed medications will improve Ability to identify triggers associated with substance abuse/mental health issues will improve  Medication Management: Evaluate patient's response, side effects, and tolerance of medication regimen.  Therapeutic Interventions: 1 to 1 sessions, Unit Group  sessions and Medication administration.  Evaluation of Outcomes: Progressing  Physician Treatment Plan for Secondary Diagnosis: Principal Problem:   Schizophrenia (HCC)  Long Term Goal(s): Improvement in symptoms so as ready for discharge   Short Term Goals: Ability to identify changes in lifestyle to reduce recurrence of condition will improve Ability to verbalize feelings will improve Ability to disclose and discuss suicidal ideas Ability to demonstrate self-control will improve Ability to identify and develop effective coping behaviors will improve Ability to maintain  clinical measurements within normal limits will improve Compliance with prescribed medications will improve Ability to identify triggers associated with substance abuse/mental health issues will improve     Medication Management: Evaluate patient's response, side effects, and tolerance of medication regimen.  Therapeutic Interventions: 1 to 1 sessions, Unit Group sessions and Medication administration.  Evaluation of Outcomes: Progressing   RN Treatment Plan for Primary Diagnosis: Schizophrenia (HCC) Long Term Goal(s): Knowledge of disease and therapeutic regimen to maintain health will improve  Short Term Goals: Ability to remain free from injury will improve, Ability to participate in decision making will improve, Ability to verbalize feelings will improve, Ability to disclose and discuss suicidal ideas, and Ability to identify and develop effective coping behaviors will improve  Medication Management: RN will administer medications as ordered by provider, will assess and evaluate patient's response and provide education to patient for prescribed medication. RN will report any adverse and/or side effects to prescribing provider.  Therapeutic Interventions: 1 on 1 counseling sessions, Psychoeducation, Medication administration, Evaluate responses to treatment, Monitor vital signs and CBGs as ordered, Perform/monitor CIWA, COWS, AIMS and Fall Risk screenings as ordered, Perform wound care treatments as ordered.  Evaluation of Outcomes: Progressing   LCSW Treatment Plan for Primary Diagnosis: Schizophrenia (HCC) Long Term Goal(s): Safe transition to appropriate next level of care at discharge, Engage patient in therapeutic group addressing interpersonal concerns.  Short Term Goals: Engage patient in aftercare planning with referrals and resources, Increase social support, Increase emotional regulation, Facilitate acceptance of mental health diagnosis and concerns, Identify triggers  associated with mental health/substance abuse issues, and Increase skills for wellness and recovery  Therapeutic Interventions: Assess for all discharge needs, 1 to 1 time with Social worker, Explore available resources and support systems, Assess for adequacy in community support network, Educate family and significant other(s) on suicide prevention, Complete Psychosocial Assessment, Interpersonal group therapy.  Evaluation of Outcomes: Progressing   Progress in Treatment: Attending groups: No. Participating in groups: No. Taking medication as prescribed: Yes. Toleration medication: Yes. Family/Significant other contact made: Yes, will contact: Mother and Father  Patient understands diagnosis: Yes. Discussing patient identified problems/goals with staff: Yes. Medical problems stabilized or resolved: Yes. Denies suicidal/homicidal ideation: No. Issues/concerns per patient self-inventory: Yes. Other: none   New problem(s) identified: No, Describe:  none   New Short Term/Long Term Goal(s): Patient to work towards detox, elimination of symptoms of psychosis, medication management for mood stabilization; elimination of SI thoughts; development of comprehensive mental wellness/sobriety plan.   Patient Goals:  Patient states their goal for treatment is to "get my strength back . . . Attention and concentration."   Discharge Plan or Barriers: No psychosocial barriers identified at this time, patient to return to place of residence when appropriate for discharge.    Reason for Continuation of Hospitalization: Depression Other; describe psychotic features    Estimated Length of Stay:1-7 days   Last 3 [place] Suicide Severity Risk Score: Flowsheet Row [facility]31/2021 [facility] EM[facility] ED from 07/21/2021 in [facility] EMERGENCY DEPARTMENT  C-SSRS RISK CATEGORY No Risk No  Risk No Risk       Last PHQ 2/9 Scores:     No data to display          Scribe for Treatment Team: Catha Brow 09/14/2021 8:55 AM

## 2021-09-14 NOTE — BHH Suicide Risk Assessment (Signed)
[facility] Discharge Suicide Risk Assessment   Principal Problem: Schizophrenia [facility]) Discharge Diagnoses: Principal Problem:   Schizophrenia (HCC)   Total Time spent with patient: 45 minutes  Reason for admission: Wesley Peterson is a 36 y.o [place] American male with a history of Schizophrenia who presented voluntarily to the [facility] for treatment and stabilization of his mood, from [facility] ER with complaints of worsening auditory hallucinations, passive SI & depressive symptoms in the context of medication non compliance and homelessness.    PTA Medications:  Medications Prior to Admission  Medication Sig Dispense Refill Last Dose   amphetamine-dextroamphetamine (ADDERALL) 5 MG tablet Take 1 tablet by mouth 2 (two) times daily.     Past Week   benztropine (COGENTIN) 1 MG tablet Take 1 tablet (1 mg total) by mouth at bedtime.       Hospital Course:   During the patient's hospitalization, patient had extensive initial psychiatric evaluation, and follow-up psychiatric evaluations every day.  Psychiatric diagnoses provided upon initial assessment: Principal Problem:   Schizophrenia (HCC)  Patient's psychiatric medications were adjusted on admission: -Start Geodon 40 mg BID with meals for psychosis -Start Agitation protocol as per MAR (Zyprexa PO/IM PRN) -Start Trazodone 50 mg nightly PRN for insomnia -Continue Hydroxyzine 25 mg PRN TID for anxiety  During the hospitalization, other adjustments were made to the patient's psychiatric medication regimen: Geodon was titrated up to 60 mg twice daily with meals for mood and psychosis, Norvasc was continued at same home dose 5 mg daily for hypertension, Ingrezza was added 40 mg daily for noted signs consistent with TD, Protonix was added for symptoms of GERD 40 mg daily, trazodone was discontinued for lack of efficacy and patient was started on melatonin 3 mg at bedtime as needed for sleep, was used few times during hospital stay with good  efficacy reported.  Patient's care was discussed during the interdisciplinary team meeting every day during the hospitalization.  The patient denied having side effects to prescribed psychiatric medication.  Patient symptoms of psychosis was noted to improve significantly after first few days of hospitalization he was noted to deny any further psychosis, delusions or paranoia.  In fact in the last few days of hospitalization he was noted to have better insight able to note reasons of his admission and able to identify and refer to describe delusions as "part of my hallucinations when I came and I do not have that now" he was able to identify benefit of being on medications for his mental illness diagnosis of schizophrenia and agreed to comply with medications and follow-up appointment after discharge.  Gradually, patient started adjusting to milieu. The patient was evaluated each day by a clinical provider to ascertain response to treatment. Improvement was noted by the patient's report of decreasing symptoms, improved sleep and appetite, affect, medication tolerance, behavior, and participation in unit programming.  Patient was asked each day to complete a self inventory noting mood, mental status, pain, new symptoms, anxiety and concerns.    Symptoms were reported as significantly decreased or resolved completely by discharge.   On day of discharge, patient was evaluated 09/15/2021, the patient reports that their mood is stable. The patient denied having suicidal thoughts for more than 48 hours prior to discharge.  Patient denies having homicidal thoughts.  Patient denies having auditory hallucinations.  Patient denies any visual hallucinations or other symptoms of psychosis. The patient was motivated to continue taking medication with a goal of continued improvement in mental health.  The patient reports their target psychiatric symptoms of paranoia and psychosis, insomnia responded well to the  psychiatric medications, and the patient reports overall benefit other psychiatric hospitalization. Supportive psychotherapy was provided to the patient. The patient also participated in regular group therapy while hospitalized. Coping skills, problem solving as well as relaxation therapies were also part of the unit programming.  Labs were reviewed with the patient, and abnormal results were discussed with the patient.  The patient is able to verbalize their individual safety plan to this provider.  Behavioral Events: None reported but was more irritable during first few days of hospitalization secondary to ongoing paranoia and psychosis  Restraints: None used  Groups: Later during hospital stay as paranoia and psychosis improved patient started participating in groups with encouragement, at one point he had to be put to lockdown for his room to encourage group participation.  Medications Changes: As above  D/C Medications: Norvasc 5 mg daily for hypertension, Protonix 40 mg daily for GERD, Ingrezza 40 mg daily for TD, Geodon 60 mg twice daily with meals for psychosis and mood, melatonin 3 mg at bedtime as needed for sleep  Sleep  Sleep:No data recorded  Musculoskeletal: Strength & Muscle Tone: within normal limits Gait & Station: normal Patient leans: N/A  Psychiatric Specialty Exam  General Appearance: appears at stated age, fairly dressed and groomed  Behavior: pleasant and cooperative  Psychomotor Activity:No psychomotor agitation or retardation noted   Eye Contact: improved yet limited, baseline Speech: at baseline decreased amount, tone, volume and latency   Mood: euthymic Affect: congruent, pleasant and interactive, restricted at baseline  Thought Process: linear, goal directed, no circumstantial or tangential thought process noted, no racing thoughts or flight of ideas Descriptions of Associations: intact Thought Content: Hallucinations: denies AH, VH , does not  appear responding to stimuli Delusions: No paranoia or other delusions noted Suicidal Thoughts: denies SI, intention, plan  Homicidal Thoughts: denies HI, intention, plan   Alertness/Orientation: alert and fully oriented  Insight: fair, improved Judgment: fair, improved  Memory: intact  Executive Functions  Concentration: intact  Attention Span: Fair Recall: intact Fund of Knowledge: fair   Art therapist  Concentration: intact Attention Span: Fair Recall: intact Fund of Knowledge: fair   Assets  Assets:Physical Health; Manufacturing systems engineer   Physical Exam: Physical Exam ROS Blood pressure (!) 126/92, pulse 83, temperature 98.4 F (36.9 C), temperature source Oral, resp. rate 16, height 6\' 1"  (1.854 m), weight 66.2 kg, SpO2 97 %. Body mass index is 19.26 kg/m.  Mental Status Per Nursing Assessment::   On Admission:  NA  Demographic Factors:  Male, Low socioeconomic status, Living alone, and Unemployed  Loss Factors: NA  Historical Factors: NA  Risk Reduction Factors:   Positive social support  Continued Clinical Symptoms: Paranoia and psychosis improved significantly during hospital stay Schizophrenia:   Paranoid or undifferentiated type  Cognitive Features That Contribute To Risk:  None    Suicide Risk:  Minimal: No identifiable suicidal ideation.  Patients presenting with no risk factors but with morbid ruminations; may be classified as minimal risk based on the severity of the depressive symptoms   Follow-up Information     [facility], [facility]. Go on 09/16/2021.   Why: You have a hospital follow up appointment for medication management services on 09/16/21 at 9:00 am.  This appointment will be held in person. During this appt, please request to get connected to ACTT services Contact information: [address] [place] [address] [phone]  [facility]. Go on 09/22/2021.   Why: You have an  appointment for therapy services on 09/22/21 at 1:20 pm.  This appointment will be held in person, but will be using Zoom. Contact information: [address] [place] [address] [phone]                 Plan Of Care/Follow-up recommendations:   Discharge recommendations:    Activity: as tolerated  Diet: heart healthy  # It is recommended to the patient to continue psychiatric medications as prescribed, after discharge from the hospital.     # It is recommended to the patient to follow up with your outpatient psychiatric provider and PCP.   # It was discussed with the patient, the impact of alcohol, drugs, tobacco have been there overall psychiatric and medical wellbeing, and total abstinence from substance use was recommended the patient.ed. Patient was informed of recommendation not to restart Adderall after discharge as probably was contributing to worsening mood and psychosis in addition to noncompliance with psychotropic medications.  # Prescriptions provided or sent directly to preferred pharmacy at discharge. Patient agreeable to plan. Given opportunity to ask questions. Appears to feel comfortable with discharge.    # In the event of worsening symptoms, the patient is instructed to call the crisis hotline, 911 and or go to the nearest ED for appropriate evaluation and treatment of symptoms. To follow-up with primary care provider for other medical issues, concerns and or health care needs   # Patient was discharged home with a plan to follow up as noted above.  -Follow-up with outpatient primary care doctor and other specialists -for management of chronic medical disease, including: Patient was counseled regarding need to comply with outpatient follow-up appointment with primary care provider for management of hypertension and GERD   Patient agrees with D/C instructions and plan.  The patient received suicide prevention pamphlet:  Yes Belongings returned:   Clothing and Valuables  Total Time Spent in Direct Patient Care:  I personally spent 45 minutes on the unit in direct patient care. The direct patient care time included face-to-face time with the patient, reviewing the patient's chart, communicating with other professionals, and coordinating care. Greater than 50% of this time was spent in counseling or coordinating care with the patient regarding goals of hospitalization, psycho-education, and discharge planning needs.   Anelle Parlow 09/14/2021, 2:24 PM   Reford Olliff Abbott Pao, MD 09/14/2021, 2:24 PM

## 2021-09-14 NOTE — Progress Notes (Signed)

## 2021-09-14 NOTE — Group Note (Signed)
Date:  09/14/2021 Time:  9:58 AM  Group Topic/Focus:  Orientation:   The focus of this group is to educate the patient on the purpose and policies of crisis stabilization and provide a format to answer questions about their admission.  The group details unit policies and expectations of patients while admitted.    Participation Level:  Active  Participation Quality:  Attentive  Affect:  Blunted  Cognitive:  Oriented  Insight: Improving  Engagement in Group:  Improving  Modes of Intervention:  Discussion  Additional Comments:     Wesley Peterson 09/14/2021, 9:58 AM

## 2021-09-15 DIAGNOSIS — F200 Paranoid schizophrenia: Secondary | ICD-10-CM

## 2021-09-15 NOTE — Group Note (Signed)
Recreation Therapy Group Note   Group Topic:Leisure Education  Group Date: 09/15/2021 Start Time: 1100 End Time: 1125 Facilitators: Caroll Rancher, LRT,CTRS Location: 400 Hall Dayroom   Goal Area(s) Addresses:  Patient will successfully identify positive leisure and recreation activities.  Patient will acknowledge benefits of participation in healthy leisure activities post discharge.  Patient will actively work with peers toward a shared goal.   Group Description: Pictionary. In groups of 5-7, patients took turns trying to guess the picture being drawn on the board by their teammate.  If the team guessed the correct answer, they won a point.  If the team guessed wrong, the other team got a chance to steal the point. After several rounds of game play, the team with the most points were declared winners. Post-activity discussion reviewed benefits of positive recreation outlets: reducing stress, improving coping mechanisms, increasing self-esteem, and building larger support systems.    Affect/Mood: Appropriate   Participation Level: Moderate   Participation Quality: Independent   Behavior: Appropriate   Speech/Thought Process: Focused   Insight: Moderate   Judgement: Moderate   Modes of Intervention: Cooperative Play   Patient Response to Interventions:  Engaged   Education Outcome:  Acknowledges education and In group clarification offered    Clinical Observations/Individualized Feedback: Pt was engaged and attentive during activity.  Pt needed extra explanation of what the objective of the activity was.  Pt was focused during group session and made an effort to guess the drawings of his peers.  Pt did eventually leave group early and did not return.      Plan: Continue to engage patient in RT group sessions 2-3x/week.   Caroll Rancher, LRT,CTRS  09/15/2021 11:57 AM

## 2021-09-15 NOTE — Plan of Care (Signed)
Patient engaged with peers and staff in a pro-social manner at twice at completion of recreation therapy group sessions.   Caroll Rancher, LRT,CTRS

## 2021-09-15 NOTE — Progress Notes (Addendum)
[facility] Group Notes:  (Nursing/MHT/Case Management/Adjunct)  Date:  09/14/2021 Time:  8:00 PM  Type of Therapy:  Wrap up group  Participation Level:  Did Not Attend  Participation Quality:    Affect:   Cognitive:    Insight:    Engagement in Group:    Modes of Intervention:    Summary of Progress/Problems:  Wesley Peterson 09/15/2021, 3:14 AM

## 2021-09-15 NOTE — Progress Notes (Signed)
Pt left facility at this time via taxi cab. Pt removed all belongings and medications and discharge instructions. Pt verbalized understanding of medications, dsicharge instructions, and follow up care. Pt denies SI/HI/self harm thoughts as well as a/v hallucinations today.

## 2021-09-15 NOTE — BHH Counselor (Signed)
CSW received a voicemail from [facility] that stated that since the Pt lives in the [place] area the referral for ACT Services was sent to the [facility].  The phone number to this office is [phone].  The agency will be reaching out to the Pt and his family after discharge.

## 2021-09-15 NOTE — [facility] Group Notes (Signed)
Adult Psychoeducational Group Note  Date:  09/15/2021 Time:  10:35 AM  Group Topic/Focus:  Goals Group:   The focus of this group is to help patients establish daily goals to achieve during treatment and discuss how the patient can incorporate goal setting into their daily lives to aide in recovery.  Participation Level:  Active  Participation Quality:  Attentive  Affect:  Appropriate  Cognitive:  Appropriate  Insight: Appropriate  Engagement in Group:  Engaged  Modes of Intervention:  Discussion  Additional Comments:  Patient attended goals group and was attentive the duration of it. Patient's goal was to have a positive discharge.   Gurjot Brisco T Enrique Weiss 09/15/2021, 10:35 AM

## 2021-09-15 NOTE — Progress Notes (Signed)
Recreation Therapy Notes  INPATIENT RECREATION TR PLAN  Patient Details Name: Dillin Lofgren MRN: [id number] DOB: [birth year] Today's Date: 09/15/2021  Rec Therapy Plan Is patient appropriate for Therapeutic Recreation?: Yes Treatment times per week: about 3 days Estimated Length of Stay: 5-7 days TR Treatment/Interventions: Group participation (Comment)  Discharge Criteria Pt will be discharged from therapy if:: Discharged Treatment plan/goals/alternatives discussed and agreed upon by:: Patient/family  Discharge Summary Short term goals set: See patient care plan Short term goals met: Complete Progress toward goals comments: Groups attended Which groups?: Coping skills, Leisure education, Other (Comment) (Team Building) Reason goals not met: None Therapeutic equipment acquired: N/A Reason patient discharged from therapy: Discharge from hospital Pt/family agrees with progress & goals achieved: No Date patient discharged from therapy: 09/15/21   Victorino Sparrow, Vickki Muff, Madeira A 09/15/2021, 12:25 PM

## 2021-09-15 NOTE — Progress Notes (Signed)
  [facility] Adult Case Management Discharge Plan :  Will you be returning to the same living situation after discharge:  Yes,  Own apartment  At discharge, do you have transportation home?: Yes,  Taxi  Do you have the ability to pay for your medications: Yes,  Medicaid   Release of information consent forms completed and in the chart;  Patient's signature needed at discharge.  Patient to Follow up at:  Follow-up Information     [facility]. Go on 09/16/2021.   Why: You have a hospital follow up appointment for medication management services on 09/16/21 at 9:00 am.  This appointment will be held in person. During this appt, please request to get connected to ACTT services Contact information: [address] [place] [address] [phone]         [facility]. Go on 09/22/2021.   Why: You have an appointment for therapy services on 09/22/21 at 1:20 pm.  This appointment will be held in person, but will be using Zoom. Contact information: [address] [place] [address] [phone]         [facility] Follow up.   Why: An ACT referral has been made for you to this agency.  Please contact them to establish a date to begin services. Contact information: [address] [place] [address] [phone]                 Next level of care provider has access to [facility] Link:no  Safety Planning and Suicide Prevention discussed: Yes,  with patient, mother, and father      Has patient been referred to the Quitline?: N/A patient is not a smoker  Patient has been referred for addiction treatment: Pt. refused referral  Aram Beecham, Theresia Majors 09/15/2021, 9:37 AM

## 2021-09-15 NOTE — Discharge Summary (Signed)
Physician Discharge Summary Note  Patient:  Wesley Peterson is an 36 y.o., male MRN:  [id number] DOB:  1985/05/17 Patient phone:  [phone] (home)  Patient address:   [address] [place] [address],  Total Time spent with patient: 45 minutes  Date of Admission:  09/03/2021 Date of Discharge: 09/15/2021  Reason for Admission:  Wesley Peterson is a 36 y.o [place] American male with a history of Schizophrenia who presented voluntarily to the [facility] for treatment and stabilization of his mood, from [facility] ER with complaints of worsening auditory hallucinations, passive SI & depressive symptoms in the context of medication non compliance and homelessness.   Principal Problem: Schizophrenia [facility]) Discharge Diagnoses: Principal Problem:   Schizophrenia [facility])   Past Psychiatric History: Schizophrenia  Past Medical History:  Past Medical History:  Diagnosis Date   Schizophrenia (HCC)    Schizophrenia, paranoid type (HCC) 02/02/2021   History reviewed. No pertinent surgical history. Family History: History reviewed. No pertinent family history. Family Psychiatric  History: None reported Social History:  Social History   Substance and Sexual Activity  Alcohol Use Not Currently   Alcohol/week: 1.0 standard drink of alcohol   Types: 1 Cans of beer per week   Comment: occasional     Social History   Substance and Sexual Activity  Drug Use Never    Social History   Socioeconomic History   Marital status: Single    Spouse name: Not on file   Number of children: 1   Years of education: Not on file   Highest education level: Not on file  Occupational History   Occupation: Unemployed  Tobacco Use   Smoking status: Never   Smokeless tobacco: Never  Vaping Use   Vaping Use: Never used  Substance and Sexual Activity   Alcohol use: Not Currently    Alcohol/week: 1.0 standard drink of alcohol    Types: 1 Cans of beer per week    Comment: occasional   Drug use:  Never   Sexual activity: Never  Other Topics Concern   Not on file  Social History Narrative   ** Merged History Encounter **       Pt lives with mother and other relatives.  Currently unemployed.  Receives outpatient psychiatry services through [facility].   Social Determinants of Health   Financial Resource Strain: Not on file  Food Insecurity: Not on file  Transportation Needs: Not on file  Physical Activity: Not on file  Stress: Not on file  Social Connections: Not on file    Hospital Course:  During the patient's hospitalization, patient had extensive initial psychiatric evaluation, and follow-up psychiatric evaluations every day.   Psychiatric diagnoses provided upon initial assessment: Principal Problem:   Schizophrenia (HCC)   Patient's psychiatric medications were adjusted on admission: -Start Geodon 40 mg BID with meals for psychosis -Start Agitation protocol as per MAR (Zyprexa PO/IM PRN) -Start Trazodone 50 mg nightly PRN for insomnia -Continue Hydroxyzine 25 mg PRN TID for anxiety   During the hospitalization, other adjustments were made to the patient's psychiatric medication regimen: Geodon was titrated up to 60 mg twice daily with meals for mood and psychosis, Norvasc was continued at same home dose 5 mg daily for hypertension, Ingrezza was added 40 mg daily for noted signs consistent with TD, Protonix was added for symptoms of GERD 40 mg daily, trazodone was discontinued for lack of efficacy and patient was started on melatonin 3 mg at bedtime as needed for  sleep, was used few times during hospital stay with good efficacy reported.   Patient's care was discussed during the interdisciplinary team meeting every day during the hospitalization.   The patient denied having side effects to prescribed psychiatric medication.   Patient symptoms of psychosis was noted to improve significantly after first few days of hospitalization he was noted to deny any further psychosis,  delusions or paranoia.  In fact in the last few days of hospitalization he was noted to have better insight able to note reasons of his admission and able to identify and refer to describe delusions as "part of my hallucinations when I came and I do not have that now" he was able to identify benefit of being on medications for his mental illness diagnosis of schizophrenia and agreed to comply with medications and follow-up appointment after discharge.   Gradually, patient started adjusting to milieu. The patient was evaluated each day by a clinical provider to ascertain response to treatment. Improvement was noted by the patient's report of decreasing symptoms, improved sleep and appetite, affect, medication tolerance, behavior, and participation in unit programming.  Patient was asked each day to complete a self inventory noting mood, mental status, pain, new symptoms, anxiety and concerns.     Symptoms were reported as significantly decreased or resolved completely by discharge.    On day of discharge, patient was evaluated 09/15/2021, the patient reports that their mood is stable. The patient denied having suicidal thoughts for more than 48 hours prior to discharge.  Patient denies having homicidal thoughts.  Patient denies having auditory hallucinations.  Patient denies any visual hallucinations or other symptoms of psychosis. The patient was motivated to continue taking medication with a goal of continued improvement in mental health.    The patient reports their target psychiatric symptoms of paranoia and psychosis, insomnia responded well to the psychiatric medications, and the patient reports overall benefit other psychiatric hospitalization. Supportive psychotherapy was provided to the patient. The patient also participated in regular group therapy while hospitalized. Coping skills, problem solving as well as relaxation therapies were also part of the unit programming.   Labs were reviewed with the  patient, and abnormal results were discussed with the patient.   The patient is able to verbalize their individual safety plan to this provider.   Behavioral Events: None reported but was more irritable during first few days of hospitalization secondary to ongoing paranoia and psychosis   Restraints: None used   Groups: Later during hospital stay as paranoia and psychosis improved patient started participating in groups with encouragement, at one point he had to be put to lockdown for his room to encourage group participation.   Medications Changes: As above   D/C Medications: Norvasc 5 mg daily for hypertension, Protonix 40 mg daily for GERD, Ingrezza 40 mg daily for TD, Geodon 60 mg twice daily with meals for psychosis and mood, melatonin 3 mg at bedtime as needed for sleep  Physical Findings: AIMS: Facial and Oral Movements Muscles of Facial Expression: None, normal Lips and Perioral Area: None, normal Jaw: None, normal Tongue: None, normal,Extremity Movements Upper (arms, wrists, hands, fingers): None, normal Lower (legs, knees, ankles, toes): None, normal, Trunk Movements Neck, shoulders, hips: None, normal, Overall Severity Severity of abnormal movements (highest score from questions above): None, normal Incapacitation due to abnormal movements: None, normal Patient's awareness of abnormal movements (rate only patient's report): No Awareness, Dental Status Current problems with teeth and/or dentures?: No Does patient usually wear dentures?: No  CIWA:    COWS:     Musculoskeletal: Strength & Muscle Tone: within normal limits Gait & Station: normal Patient leans: N/A   Psychiatric Specialty Exam:  General Appearance: appears at stated age, fairly dressed and groomed   Behavior: pleasant and cooperative   Psychomotor Activity:No psychomotor agitation or retardation noted    Eye Contact: improved yet limited, baseline Speech: at baseline decreased amount, tone, volume  and latency     Mood: euthymic Affect: congruent, pleasant and interactive, restricted at baseline   Thought Process: linear, goal directed, no circumstantial or tangential thought process noted, no racing thoughts or flight of ideas Descriptions of Associations: intact Thought Content: Hallucinations: denies AH, VH , does not appear responding to stimuli Delusions: No paranoia or other delusions noted Suicidal Thoughts: denies SI, intention, plan  Homicidal Thoughts: denies HI, intention, plan    Alertness/Orientation: alert and fully oriented   Insight: fair, improved Judgment: fair, improved   Memory: intact   Executive Functions  Concentration: intact  Attention Span: Fair Recall: intact Fund of Knowledge: fair  Assets  Assets:Physical Health; Communication Skills   Sleep Sleep:Good, improved   Physical Exam:  Physical Exam Vitals and nursing note reviewed.  Constitutional:      Appearance: Normal appearance.  HENT:     Head: Normocephalic and atraumatic.  Pulmonary:     Effort: Pulmonary effort is normal.  Musculoskeletal:        General: Normal range of motion.  Skin:    General: Skin is warm and dry.  Neurological:     General: No focal deficit present.     Mental Status: He is alert and oriented to person, place, and time.    Review of Systems  All other systems reviewed and are negative.  Blood pressure (!) 117/90, pulse 99, temperature 98.2 F (36.8 C), temperature source Oral, resp. rate 16, height 6\' 1"  (1.854 m), weight 66.2 kg, SpO2 98 %. Body mass index is 19.26 kg/m.   Social History   Tobacco Use  Smoking Status Never  Smokeless Tobacco Never   Tobacco Cessation:  N/A, patient does not currently use tobacco products   Blood Alcohol level:  Lab Results  Component Value Date   ETH <10 08/31/2021   ETH <10 07/21/2021    Metabolic Disorder Labs:  Lab Results  Component Value Date   HGBA1C 5.9 (H) 06/02/2021   MPG 122.63  06/02/2021   MPG 126 02/03/2021   Lab Results  Component Value Date   PROLACTIN 19.9 (H) 09/06/2021   PROLACTIN 17.5 (H) 06/02/2021   Lab Results  Component Value Date   CHOL 137 06/02/2021   TRIG 105 06/02/2021   HDL 36 (L) 06/02/2021   CHOLHDL 3.8 06/02/2021   VLDL 21 06/02/2021   LDLCALC 80 06/02/2021   LDLCALC 80 01/30/2021    See Psychiatric Specialty Exam and Suicide Risk Assessment completed by Attending Physician prior to discharge.  Discharge destination:  Home  Is patient on multiple antipsychotic therapies at discharge:  No   Has Patient had three or more failed trials of antipsychotic monotherapy by history:  No  Recommended Plan for Multiple Antipsychotic Therapies: NA  Discharge Instructions     Diet - low sodium heart healthy   Complete by: As directed    Increase activity slowly   Complete by: As directed       Allergies as of 09/15/2021   No Known Allergies      Medication List  STOP taking these medications    amphetamine-dextroamphetamine 5 MG tablet Commonly known as: ADDERALL   benztropine 1 MG tablet Commonly known as: COGENTIN       TAKE these medications      Indication  amLODipine 5 MG tablet Commonly known as: NORVASC Take 1 tablet (5 mg total) by mouth daily.  Indication: High Blood Pressure Disorder   melatonin 3 MG Tabs tablet Take 1 tablet (3 mg total) by mouth at bedtime as needed.  Indication: Trouble Sleeping   pantoprazole 40 MG tablet Commonly known as: PROTONIX Take 1 tablet (40 mg total) by mouth daily.  Indication: Gastroesophageal Reflux Disease   valbenazine 40 MG capsule Commonly known as: INGREZZA Take 1 capsule (40 mg total) by mouth daily.  Indication: Tardive Dyskinesia   ziprasidone 60 MG capsule Commonly known as: GEODON Take 1 capsule (60 mg total) by mouth 2 (two) times daily with a meal.  Indication: Schizophrenia        Follow-up Information     [facility], [facility]. Go  on 09/16/2021.   Why: You have a hospital follow up appointment for medication management services on 09/16/21 at 9:00 am.  This appointment will be held in person. During this appt, please request to get connected to ACTT services Contact information: [address] [place] [address] [phone]         [facility]. Go on 09/22/2021.   Why: You have an appointment for therapy services on 09/22/21 at 1:20 pm.  This appointment will be held in person, but will be using Zoom. Contact information: [address] [place] [address] [phone]         [facility] Follow up.   Why: An ACT referral has been made for you to this agency.  Please contact them to establish a date to begin services. Contact information: [address] [place] [address] [phone]                 Discharge recommendations:   Activity: as tolerated  Diet: heart healthy  # It is recommended to the patient to continue psychiatric medications as prescribed, after discharge from the hospital.     # It is recommended to the patient to follow up with your outpatient psychiatric provider and PCP.   # It was discussed with the patient, the impact of alcohol, drugs, tobacco have been there overall psychiatric and medical wellbeing, and total abstinence from substance use was recommended the patient.ed.   # Prescriptions provided or sent directly to preferred pharmacy at discharge. Patient agreeable to plan. Given opportunity to ask questions. Appears to feel comfortable with discharge.    # In the event of worsening symptoms, the patient is instructed to call the crisis hotline, 911 and or go to the nearest ED for appropriate evaluation and treatment of symptoms. To follow-up with primary care provider for other medical issues, concerns and or health care needs   # Patient was discharged home with a plan to follow up as noted  above.  -Follow-up with outpatient primary care doctor and other specialists -for management of chronic medical disease, including: Patient was counseled regarding need to comply with outpatient follow-up appointment with primary care provider for management of hypertension and GERD   Patient agrees with D/C instructions and plan.   The patient received suicide prevention pamphlet:  Yes Belongings returned:  Clothing and Valuables  Total Time Spent in Direct Patient Care:  I  personally spent 45 minutes on the unit in direct patient care. The direct patient care time included face-to-face time with the patient, reviewing the patient's chart, communicating with other professionals, and coordinating care. Greater than 50% of this time was spent in counseling or coordinating care with the patient regarding goals of hospitalization, psycho-education, and discharge planning needs.    SignedSarita Bottom: Quantina Dershem, MD 09/15/2021, 11:41 AM

## 2021-10-25 ENCOUNTER — Other Ambulatory Visit: Payer: Self-pay

## 2021-10-25 ENCOUNTER — Inpatient Hospital Stay (HOSPITAL_COMMUNITY)
Admission: AD | Admit: 2021-10-25 | Discharge: 2021-11-04 | DRG: 885 | Disposition: A | Discharge status: Home / Self Care | Payer: Medicaid Other | Source: Intra-hospital | Attending: Psychiatry | Admitting: Psychiatry

## 2021-10-25 ENCOUNTER — Encounter (HOSPITAL_COMMUNITY): Payer: Self-pay | Admitting: Emergency Medicine

## 2021-10-25 ENCOUNTER — Emergency Department (EMERGENCY_DEPARTMENT_HOSPITAL)
Admission: EM | Admit: 2021-10-25 | Discharge: 2021-10-25 | Disposition: A | Discharge status: Other Acute Inpatient Hospital | Payer: Medicaid Other | Source: Home / Self Care

## 2021-10-25 ENCOUNTER — Encounter (HOSPITAL_COMMUNITY): Payer: Self-pay | Admitting: Nurse Practitioner

## 2021-10-25 DIAGNOSIS — R45851 Suicidal ideations: Secondary | ICD-10-CM | POA: Diagnosis present

## 2021-10-25 DIAGNOSIS — Z79899 Other long term (current) drug therapy: Secondary | ICD-10-CM | POA: Diagnosis not present

## 2021-10-25 DIAGNOSIS — Z5902 Unsheltered homelessness: Secondary | ICD-10-CM

## 2021-10-25 DIAGNOSIS — F209 Schizophrenia, unspecified: Secondary | ICD-10-CM

## 2021-10-25 DIAGNOSIS — K5900 Constipation, unspecified: Secondary | ICD-10-CM | POA: Diagnosis present

## 2021-10-25 DIAGNOSIS — T43596A Underdosing of other antipsychotics and neuroleptics, initial encounter: Secondary | ICD-10-CM | POA: Diagnosis present

## 2021-10-25 DIAGNOSIS — F200 Paranoid schizophrenia: Secondary | ICD-10-CM | POA: Diagnosis present

## 2021-10-25 DIAGNOSIS — K219 Gastro-esophageal reflux disease without esophagitis: Secondary | ICD-10-CM | POA: Diagnosis present

## 2021-10-25 DIAGNOSIS — F29 Unspecified psychosis not due to a substance or known physiological condition: Secondary | ICD-10-CM

## 2021-10-25 DIAGNOSIS — R7303 Prediabetes: Secondary | ICD-10-CM | POA: Diagnosis present

## 2021-10-25 DIAGNOSIS — F411 Generalized anxiety disorder: Secondary | ICD-10-CM | POA: Diagnosis present

## 2021-10-25 DIAGNOSIS — Z1152 Encounter for screening for COVID-19: Secondary | ICD-10-CM | POA: Insufficient documentation

## 2021-10-25 DIAGNOSIS — F17290 Nicotine dependence, other tobacco product, uncomplicated: Secondary | ICD-10-CM | POA: Diagnosis present

## 2021-10-25 DIAGNOSIS — F251 Schizoaffective disorder, depressive type: Principal | ICD-10-CM | POA: Diagnosis present

## 2021-10-25 DIAGNOSIS — G4700 Insomnia, unspecified: Secondary | ICD-10-CM | POA: Diagnosis present

## 2021-10-25 DIAGNOSIS — I10 Essential (primary) hypertension: Secondary | ICD-10-CM | POA: Diagnosis present

## 2021-10-25 DIAGNOSIS — Z91148 Patient's other noncompliance with medication regimen for other reason: Secondary | ICD-10-CM

## 2021-10-25 DIAGNOSIS — M79602 Pain in left arm: Secondary | ICD-10-CM | POA: Diagnosis present

## 2021-10-25 DIAGNOSIS — Z560 Unemployment, unspecified: Secondary | ICD-10-CM

## 2021-10-25 DIAGNOSIS — Y92009 Unspecified place in unspecified non-institutional (private) residence as the place of occurrence of the external cause: Secondary | ICD-10-CM | POA: Diagnosis not present

## 2021-10-25 DIAGNOSIS — G2401 Drug induced subacute dyskinesia: Secondary | ICD-10-CM | POA: Diagnosis present

## 2021-10-25 DIAGNOSIS — F1510 Other stimulant abuse, uncomplicated: Secondary | ICD-10-CM | POA: Diagnosis present

## 2021-10-25 LAB — COMPREHENSIVE METABOLIC PANEL
ALT: 12 U/L (ref 0–44)
AST: 18 U/L (ref 15–41)
Albumin: 4.5 g/dL (ref 3.5–5.0)
Alkaline Phosphatase: 57 U/L (ref 38–126)
Anion gap: 15 (ref 5–15)
BUN: 8 mg/dL (ref 6–20)
CO2: 21 mmol/L — ABNORMAL LOW (ref 22–32)
Calcium: 10.2 mg/dL (ref 8.9–10.3)
Chloride: 105 mmol/L (ref 98–111)
Creatinine, Ser: 0.84 mg/dL (ref 0.61–1.24)
GFR, Estimated: >60 mL/min (ref >60)
Glucose, Bld: 76 mg/dL (ref 70–99)
Potassium: 4 mmol/L (ref 3.5–5.1)
Sodium: 141 mmol/L (ref 135–145)
Total Bilirubin: 0.7 mg/dL (ref 0.3–1.2)
Total Protein: 7.1 g/dL (ref 6.5–8.1)

## 2021-10-25 LAB — CBC
HCT: 41.8 % (ref 39.0–52.0)
Hemoglobin: 13.7 g/dL (ref 13.0–17.0)
MCH: 31.6 pg (ref 26.0–34.0)
MCHC: 32.8 g/dL (ref 30.0–36.0)
MCV: 96.3 fL (ref 80.0–100.0)
Platelets: 281 10*3/uL (ref 150–400)
RBC: 4.34 MIL/uL (ref 4.22–5.81)
RDW: 13.2 % (ref 11.5–15.5)
WBC: 5.6 10*3/uL (ref 4.0–10.5)
nRBC: 0 % (ref 0.0–0.2)

## 2021-10-25 LAB — RAPID URINE DRUG SCREEN, HOSP PERFORMED
Amphetamines: POSITIVE — AB
Barbiturates: NOT DETECTED
Benzodiazepines: NOT DETECTED
Cocaine: NOT DETECTED
Opiates: NOT DETECTED
Tetrahydrocannabinol: NOT DETECTED

## 2021-10-25 LAB — SALICYLATE LEVEL: Salicylate Lvl: <7 mg/dL — ABNORMAL LOW (ref 7.0–30.0)

## 2021-10-25 LAB — ETHANOL: Alcohol, Ethyl (B): <10 mg/dL (ref <10)

## 2021-10-25 LAB — SARS CORONAVIRUS 2 BY RT PCR: SARS Coronavirus 2 by RT PCR: NEGATIVE

## 2021-10-25 LAB — ACETAMINOPHEN LEVEL: Acetaminophen (Tylenol), Serum: <10 ug/mL — ABNORMAL LOW (ref 10–30)

## 2021-10-25 MED ORDER — ZIPRASIDONE MESYLATE 20 MG IM SOLR
10.0000 mg | Freq: Once | INTRAMUSCULAR | Status: DC
  Filled 2021-10-25: qty 20

## 2021-10-25 MED ORDER — ZIPRASIDONE HCL 40 MG PO CAPS
40.0000 mg | ORAL_CAPSULE | Freq: Two times a day (BID) | ORAL | Status: DC
  Administered 2021-10-25 – 2021-10-26 (×3): 40 mg via ORAL
  Filled 2021-10-25 (×6): qty 1

## 2021-10-25 MED ORDER — ZIPRASIDONE MESYLATE 20 MG IM SOLR: 20.0000 mg | Freq: Four times a day (QID) | INTRAMUSCULAR | Status: DC | PRN

## 2021-10-25 MED ORDER — LORAZEPAM 1 MG PO TABS: 1.0000 mg | ORAL_TABLET | Freq: Four times a day (QID) | ORAL | Status: DC | PRN

## 2021-10-25 MED ORDER — RISPERIDONE 1 MG PO TBDP
1.0000 mg | ORAL_TABLET | Freq: Every day | ORAL | Status: DC
  Administered 2021-10-25: 1 mg via ORAL
  Filled 2021-10-25 (×3): qty 1

## 2021-10-25 MED ORDER — PALIPERIDONE ER 3 MG PO TB24: 3.0000 mg | ORAL_TABLET | Freq: Every day | ORAL | Status: DC

## 2021-10-25 MED ORDER — ZIPRASIDONE HCL 40 MG PO CAPS
60.0000 mg | ORAL_CAPSULE | Freq: Two times a day (BID) | ORAL | Status: DC
  Filled 2021-10-25: qty 1

## 2021-10-25 MED ORDER — AMLODIPINE BESYLATE 5 MG PO TABS
5.0000 mg | ORAL_TABLET | Freq: Every day | ORAL | Status: DC
  Administered 2021-10-26 – 2021-11-04 (×10): 5 mg via ORAL
  Filled 2021-10-25 (×6): qty 1
  Filled 2021-10-25 (×2): qty 7
  Filled 2021-10-25 (×3): qty 1

## 2021-10-25 MED ORDER — PANTOPRAZOLE SODIUM 40 MG PO TBEC
40.0000 mg | DELAYED_RELEASE_TABLET | Freq: Every day | ORAL | Status: DC
  Administered 2021-10-26 – 2021-11-04 (×10): 40 mg via ORAL
  Filled 2021-10-25: qty 1
  Filled 2021-10-25: qty 7
  Filled 2021-10-25 (×4): qty 1
  Filled 2021-10-25: qty 7
  Filled 2021-10-25 (×4): qty 1

## 2021-10-25 MED ORDER — ZIPRASIDONE MESYLATE 20 MG IM SOLR
20.0000 mg | Freq: Once | INTRAMUSCULAR | Status: AC
  Administered 2021-10-25: 20 mg via INTRAMUSCULAR

## 2021-10-25 MED ORDER — ZIPRASIDONE HCL 40 MG PO CAPS: 40.0000 mg | ORAL_CAPSULE | Freq: Two times a day (BID) | ORAL | Status: DC

## 2021-10-25 MED ORDER — ADULT MULTIVITAMIN W/MINERALS CH
1.0000 | ORAL_TABLET | Freq: Every day | ORAL | Status: DC
  Administered 2021-10-25 – 2021-11-04 (×11): 1 via ORAL
  Filled 2021-10-25 (×13): qty 1

## 2021-10-25 MED ORDER — MELATONIN 3 MG PO TABS: 3.0000 mg | ORAL_TABLET | Freq: Every evening | ORAL | Status: DC | PRN

## 2021-10-25 MED ORDER — ZIPRASIDONE HCL 40 MG PO CAPS
40.0000 mg | ORAL_CAPSULE | Freq: Two times a day (BID) | ORAL | Status: DC
  Filled 2021-10-25: qty 1

## 2021-10-25 MED ORDER — ENSURE ENLIVE PO LIQD
237.0000 mL | Freq: Two times a day (BID) | ORAL | Status: DC
  Administered 2021-10-25 – 2021-11-03 (×18): 237 mL via ORAL
  Filled 2021-10-25 (×22): qty 237

## 2021-10-25 MED ORDER — VALBENAZINE TOSYLATE 40 MG PO CAPS
40.0000 mg | ORAL_CAPSULE | Freq: Every day | ORAL | Status: DC
  Administered 2021-10-26 – 2021-11-04 (×10): 40 mg via ORAL
  Filled 2021-10-25 (×2): qty 1
  Filled 2021-10-25 (×2): qty 7
  Filled 2021-10-25 (×7): qty 1

## 2021-10-25 MED ORDER — AMLODIPINE BESYLATE 5 MG PO TABS
5.0000 mg | ORAL_TABLET | Freq: Every day | ORAL | Status: DC
  Administered 2021-10-25: 5 mg via ORAL
  Filled 2021-10-25: qty 1

## 2021-10-25 MED ORDER — RISPERIDONE 1 MG PO TBDP: 1.0000 mg | ORAL_TABLET | Freq: Every day | ORAL | Status: DC

## 2021-10-25 MED ORDER — ACETAMINOPHEN 325 MG PO TABS: 650.0000 mg | ORAL_TABLET | Freq: Four times a day (QID) | ORAL | Status: DC | PRN

## 2021-10-25 MED ORDER — VALBENAZINE TOSYLATE 40 MG PO CAPS
40.0000 mg | ORAL_CAPSULE | Freq: Every day | ORAL | Status: DC
  Administered 2021-10-25: 40 mg via ORAL
  Filled 2021-10-25: qty 1

## 2021-10-25 MED ORDER — MELATONIN 3 MG PO TABS
3.0000 mg | ORAL_TABLET | Freq: Every evening | ORAL | Status: DC | PRN
  Administered 2021-10-28 – 2021-11-02 (×3): 3 mg via ORAL
  Filled 2021-10-25 (×2): qty 1
  Filled 2021-10-25: qty 7
  Filled 2021-10-25: qty 1

## 2021-10-25 MED ORDER — ZIPRASIDONE HCL 20 MG PO CAPS: 20.0000 mg | ORAL_CAPSULE | Freq: Two times a day (BID) | ORAL | Status: DC

## 2021-10-25 MED ORDER — RISPERIDONE 2 MG PO TBDP
2.0000 mg | ORAL_TABLET | Freq: Three times a day (TID) | ORAL | Status: DC | PRN
  Administered 2021-10-26: 2 mg via ORAL
  Filled 2021-10-25: qty 1

## 2021-10-25 MED ORDER — PANTOPRAZOLE SODIUM 40 MG PO TBEC
40.0000 mg | DELAYED_RELEASE_TABLET | Freq: Every day | ORAL | Status: DC
  Administered 2021-10-25: 40 mg via ORAL
  Filled 2021-10-25: qty 1

## 2021-10-25 NOTE — ED Notes (Signed)
GOTTEN SCRUBBS TO PUT  ON PATIENT WALKIN AROUND IN ROOM LIKE HE DOSENOT UNDERSTAND.

## 2021-10-25 NOTE — ED Provider Notes (Addendum)
[facility] EMERGENCY DEPARTMENT Provider Note   CSN: [id number] Arrival date & time: 10/25/21  0505     History  Chief Complaint  Patient presents with   Medical Clearance    Wesley Peterson is a 36 y.o. male.  HPI 36 year old male presents to the ER with an unknown complaint.  History of schizophrenia, paranoia.  Patient originally checked in with the complaint of head injury, however denies this to me.  He states he does not know where he is at.  He is alert and well appearing.  He denies any recent falls.  Denies any pain.  Denies any SI or HI.  Denies any auditory visual hallucinations.  He can answer orientation questions x3.  I called the patient's father Wesley Peterson.  He reports that the patient has not been compliant with his Geodon and will have presentations like this in the past.  Yesterday he stated that the patient had endorsed some suicidal ideation though no concrete plan.  Father thinks that he needs to be seen by psychiatry and started on his medications again.  They request that we take out an IVC on him as he does not think that he is safe to go home.    Home Medications Prior to Admission medications   Medication Sig Start Date End Date Taking? Authorizing Provider  amLODipine (NORVASC) 5 MG tablet Take 1 tablet (5 mg total) by mouth daily. 09/15/21   Dian Situ, MD  melatonin 3 MG TABS tablet Take 1 tablet (3 mg total) by mouth at bedtime as needed. 09/14/21   Dian Situ, MD  pantoprazole (PROTONIX) 40 MG tablet Take 1 tablet (40 mg total) by mouth daily. 09/15/21   Dian Situ, MD  valbenazine (INGREZZA) 40 MG capsule Take 1 capsule (40 mg total) by mouth daily. 09/15/21   Dian Situ, MD  ziprasidone (GEODON) 60 MG capsule Take 1 capsule (60 mg total) by mouth 2 (two) times daily with a meal. 09/14/21   Dian Situ, MD      Allergies    Patient has no known allergies.    Review of Systems   Review of Systems Ten systems reviewed and  are negative for acute change, except as noted in the HPI.   Physical Exam Updated Vital Signs BP (!) 149/98 (BP Location: Left Arm)   Pulse 77   Temp 98.1 F (36.7 C) (Oral)   Resp 17   SpO2 100%  Physical Exam Vitals and nursing note reviewed.  Constitutional:      General: He is not in acute distress.    Appearance: He is well-developed.  HENT:     Head: Normocephalic and atraumatic.  Eyes:     Conjunctiva/sclera: Conjunctivae normal.  Cardiovascular:     Rate and Rhythm: Normal rate and regular rhythm.     Heart sounds: No murmur heard. Pulmonary:     Effort: Pulmonary effort is normal. No respiratory distress.     Breath sounds: Normal breath sounds.  Abdominal:     Palpations: Abdomen is soft.     Tenderness: There is no abdominal tenderness.  Musculoskeletal:        General: No swelling.     Cervical back: Neck supple.  Skin:    General: Skin is warm and dry.     Capillary Refill: Capillary refill takes less than 2 seconds.  Neurological:     Mental Status: He is alert.  Psychiatric:        Mood and Affect:  Mood normal.     Comments: Patient is calm, cooperative with flat affect     ED Results / Procedures / Treatments   Labs (all labs ordered are listed, but only abnormal results are displayed) Labs Reviewed  COMPREHENSIVE METABOLIC PANEL - Abnormal; Notable for the following components:      Result Value   CO2 21 (*)    All other components within normal limits  RAPID URINE DRUG SCREEN, HOSP PERFORMED - Abnormal; Notable for the following components:   Amphetamines POSITIVE (*)    All other components within normal limits  CBC  ACETAMINOPHEN LEVEL  SALICYLATE LEVEL  ETHANOL    EKG EKG Interpretation  Date/Time:  Tuesday October 25 2021 05:43:34 EDT Ventricular Rate:  64 PR Interval:  128 QRS Duration: 90 QT Interval:  390 QTC Calculation: 402 R Axis:   85 Text Interpretation: Normal sinus rhythm with sinus arrhythmia Minimal voltage  criteria for LVH, may be normal variant ( Sokolow-Lyon ) Interpretation limited secondary to artifact Confirmed by Zadie Rhine ([id number]) on 10/25/2021 5:48:32 AM  Radiology No results found.  Procedures Procedures    Medications Ordered in ED Medications  ziprasidone (GEODON) injection 20 mg (20 mg Intramuscular Given 10/25/21 7867)    ED Course/ Medical Decision Making/ A&P                           Medical Decision Making Amount and/or Complexity of Data Reviewed Labs: ordered.  Risk Prescription drug management.   36 year old male presenting to medication noncompliance and endorsing SI yesterday. Patient is a difficult historian.  Father states he has not been compliant with his medications.  Patient here today as a flat affect and is unable to tell me why he is here today. He denies SI currently but father reports he has been consistently ruminating on the topic.  Vitals overall reassuring.  Lab work reviewed, with no concerning findings.  UDS+ for amphetamines.  Given the patient is a flight risk and per father's request, IVC paperwork filled out and signed. TTS order placed. Required 20mg  Geodon for agitation. Will defer to psych for restarting his home meds as it is unclear what he has been taking  Final Clinical Impression(s) / ED Diagnoses Final diagnoses:  Suicidal ideation    Rx / DC Orders ED Discharge Orders     None           , PA-C 10/25/21 10/27/21    [id number], MD 10/25/21 [phone]

## 2021-10-25 NOTE — ED Notes (Signed)
Patient getting into paperscrubs at this time.

## 2021-10-25 NOTE — ED Notes (Signed)
Patient being wanded by security at this time.

## 2021-10-25 NOTE — ED Notes (Signed)
Patient coming in and out of room, patient redirected multiple times into room, patient with flat affect and stating he didn't want to be in a room.  Patient very paranoid at this time.  Patient given sprite and sandwich.

## 2021-10-25 NOTE — Progress Notes (Addendum)
Patient admitted under IVC from [facility] ED to [facility].  Patient upon admission is very disorganized, answers most questions with a ''no'' and forwards very little. Patient is observed during assessment to be responding to internal as when asked if he is having auditory hallucinations pt states '' no ''  then started laughing to himself. He later agreed he was hearing voices that '' were mumbling'' Pt is bizarre, scratching his head at times and confused, unable to answer logical questions with Probation officer. He does state that he '' feels empty as hell '' and when asked clarifying questions regarding that he states '' physically and emotionally''  Patient denies any SI or HI . Per report and record review pt with longstanding hx of schizophrenia, med non compliance and substance use which exacerbates acute psychosis. Pt presenting today with similar presentation.  Skin assessed and noted large scratch marks that are healing to bilateral lower legs but are scabbed over and no signs of bleeding or infection.  Pt oriented to unit and ward rules. No contraband found. Pt is safe. Pt provided meal tray , toiletries.

## 2021-10-25 NOTE — Tx Team (Signed)
Initial Treatment Plan 10/25/2021 2:42 PM Wesley Peterson [id number]    PATIENT STRESSORS: Financial difficulties   Medication change or noncompliance   Substance abuse     PATIENT STRENGTHS: Ability for insight  Active sense of humor  Capable of independent living  Engineer, drilling fund of knowledge  Motivation for treatment/growth    PATIENT IDENTIFIED PROBLEMS:   Substance use psychosis                   DISCHARGE CRITERIA:  Adequate post-discharge living arrangements Improved stabilization in mood, thinking, and/or behavior Medical problems require only outpatient monitoring Reduction of life-threatening or endangering symptoms to within safe limits Safe-care adequate arrangements made Verbal commitment to aftercare and medication compliance  PRELIMINARY DISCHARGE PLAN: Attend PHP/IOP Attend 12-step recovery group Outpatient therapy Participate in family therapy Placement in alternative living arrangements  PATIENT/FAMILY INVOLVEMENT: This treatment plan has been presented to and reviewed with the patient, Wesley Peterson, and/or family member,   The patient and family have been given the opportunity to ask questions and make suggestions.  Leonia Reader, RN 10/25/2021, 2:42 PM

## 2021-10-25 NOTE — ED Notes (Signed)
Called for a sitter and they do not have anyone available. They will send one when one is available.

## 2021-10-25 NOTE — ED Triage Notes (Signed)
Patient came from the street, stating that he doesn't know where he is.  Patient is able to speak his full name, town that he is in and appropriate year.  Patient is unable to given any other information.  Patient denies any SI or HI at this time in triage.  PA spoke with patient's father, who states that patient is off his Geodon.  Patient does have a flat affect, cooperative at this time. Per patient's father, he was endorsing SI yesterday.

## 2021-10-25 NOTE — Consult Note (Signed)
[place] ED ASSESSMENT   Reason for Consult:  schizophrenia Referring Physician:  Walker Peterson, [place] Patient Identification: Wesley Peterson MRN:  [id number] ED Chief Complaint: Psychosis [facility])  Diagnosis:  Principal Problem:   Psychosis ([place]) Active Problems:   Schizophrenia [facility])   ED Assessment Time Calculation: Start Time: 1000 Stop Time: 1030 Total Time in Minutes (Assessment Completion): 30   HPI:   Wesley Peterson is a 36 y.o. male patient who voluntarily presented to [facility] ED with an unknown complaint.  He originally checked in with complaint of head injury but then denied that to EDP when asked about it.  He did not know where he was at but appeared alert.  EDP spoke with patient's father, Wesley Peterson, and reported the patient has not been compliant with his Geodon and has had presentations like this in the past when off medications.  Yesterday he mentioned to his dad some suicidal ideations with no plan.  Father is hoping for him to get restarted on medications prior to coming home.  Subjective:   Patient seen at [facility] ED for face-to-face evaluation.  He appears very confused when I wake him up, and gives me minimal responses to my questions.  He tells me he is unsure of how he got here, however he is oriented x4.  He is able to tell me his full name, current location, the month, year, and current president.  He denies any suicidal or homicidal thoughts.  He does endorse auditory hallucinations but when I ask him to elaborate he just stares at me and says nothing.  He denies any visual hallucinations.  Patient denies any illicit substance use or alcohol use to me, however his urine toxicology is positive for amphetamines which could have exacerbated his psychosis.  I asked him when the last time he took Geodon was and patient tells me he is not on any medications.  I asked him if he should be on medications and patient stated no.  Unsure of the last time patient took his Geodon or other  medications.  He denies problems with sleep or appetite.  Patient did receive Geodon 20 mg IM at 06 29 so he does appear sedated during assessment.  I was able to speak to his father, Wesley Peterson, at [phone].  He tells me the patient used to live with him but has pretty much been homeless for the past few months.  He expressed a lot of frustration that the patient goes to the hospital and gets better but as soon as he is discharged from the hospital stops taking his medication.  Wesley Peterson tells me he is either staying with friends or going to different shelters.  He is unsure if he is using illicit substances or alcohol.  He is really worried about him and is hoping that the patient gets on a long-acting injection.  He acknowledges the Geodon works well for him but he is really frustrated with the patient's noncompliance as he has been to the hospital numerous times this year for psychosis and medication noncompliance.  Patient was last hospitalized at [facility] from 09/18/2021 - 09/29/2021.  It is likely that was the last time patient took his medications.  It appears they continued his Geodon 60 mg twice daily and also added Risperdal 1 mg in the morning and 2 mg at night.  At this time we will continue his Geodon at 40 mg twice daily and restart Risperdal 1 mg nightly. Will be able to  titrate medication in inpatient setting. I do not see any evidence that they gave him Risperdal [place] or [place]. Would recommend Inpatient treatment providers consider [facility]  due to patients long history of medication noncompliance.                   Past Psychiatric History:  Diagnosed with schizophrenia around 3 years ago.  Patient has had numerous hospital presentations for psychosis and medication noncompliance.  Many inpatient psychiatric admissions.  Risk to Self or Others: Is the patient at risk to self? Yes Has the patient been a risk to self in the past 6 months? No Has the patient  been a risk to self within the distant past? No Is the patient a risk to others? No Has the patient been a risk to others in the past 6 months? No Has the patient been a risk to others within the distant past? No  [place] Scale:  [place] ED from 10/25/2021 in [place] Admission (Discharged) from 09/03/2021 in [facility] ED from 08/31/2021 in [place] No Risk No Risk No Risk      Past Medical History:  Past Medical History:  Diagnosis Date   Schizophrenia ([place])    Schizophrenia, paranoid type ([place]) 02/02/2021   History reviewed. No pertinent surgical history. Family History: History reviewed. No pertinent family history.  Social History:  Social History   Substance and Sexual Activity  Alcohol Use Not Currently   Alcohol/week: 1.0 standard drink of alcohol   Types: 1 Cans of beer per week   Comment: occasional     Social History   Substance and Sexual Activity  Drug Use Never    Social History   Socioeconomic History   Marital status: Single    Spouse name: Not on file   Number of children: 1   Years of education: Not on file   Highest education level: Not on file  Occupational History   Occupation: Unemployed  Tobacco Use   Smoking status: Never   Smokeless tobacco: Never  Vaping Use   Vaping Use: Never used  Substance and Sexual Activity   Alcohol use: Not Currently    Alcohol/week: 1.0 standard drink of alcohol    Types: 1 Cans of beer per week    Comment: occasional   Drug use: Never   Sexual activity: Never  Other Topics Concern   Not on file  Social History Narrative   ** Merged History Encounter **       Pt lives with mother and other relatives.  Currently unemployed.  [place] [facility]ry services through [facility].   Social Determinants of Health   Financial Resource Strain: Not on file  Food  Insecurity: Not on file  Transportation Needs: Not on file  Physical Activity: Not on file  Stress: Not on file  Social Connections: Not on file   Additional Social History:    Allergies:  No Known Allergies  Labs:  Results for orders placed or performed during the hospital encounter of 10/25/21 (from the past 48 hour(s))  CBC     Status: None   Collection Time: 10/25/21  5:32 AM  Result Value Ref Range   WBC 5.6 4.0 - 10.5 K/uL   RBC 4.34 4.22 - 5.81 MIL/uL   Hemoglobin 13.7 13.0 - 17.0 g/dL   HCT 41.8 39.0 - 52.0 %   MCV 96.3 80.0 -  100.0 fL   MCH 31.6 26.0 - 34.0 pg   MCHC 32.8 30.0 - 36.0 g/dL   RDW 13.2 11.5 - 15.5 %   Platelets 281 150 - 400 K/uL   nRBC 0.0 0.0 - 0.2 %    Comment: Performed at [facility], [place] [address] [place], [place] [address]  Comprehensive metabolic panel     Status: Abnormal   Collection Time: 10/25/21  5:32 AM  Result Value Ref Range   Sodium 141 135 - 145 mmol/L   Potassium 4.0 3.5 - 5.1 mmol/L   Chloride 105 98 - 111 mmol/L   CO2 21 (L) 22 - 32 mmol/L   Glucose, Bld 76 70 - 99 mg/dL    Comment: Glucose reference range applies only to samples taken after fasting for at least 8 hours.   BUN 8 6 - 20 mg/dL   Creatinine, Ser 0.84 0.61 - 1.24 mg/dL   Calcium 10.2 8.9 - 10.3 mg/dL   Total Protein 7.1 6.5 - 8.1 g/dL   Albumin 4.5 3.5 - 5.0 g/dL   AST 18 15 - 41 U/L   ALT 12 0 - 44 U/L   Alkaline Phosphatase 57 38 - 126 U/L   Total Bilirubin 0.7 0.3 - 1.2 mg/dL   GFR, Estimated >60 >60 mL/min    Comment: (NOTE) Calculated using the CKD-EPI Creatinine Equation (2021)    Anion gap 15 5 - 15    Comment: Performed at [facility] [address], [place], [place] [address]  Rapid urine drug screen (hospital performed)     Status: Abnormal   Collection Time: 10/25/21  5:32 AM  Result Value Ref Range   Opiates NONE DETECTED NONE DETECTED   Cocaine NONE DETECTED NONE DETECTED   Benzodiazepines NONE DETECTED NONE DETECTED    Amphetamines POSITIVE (A) NONE DETECTED   Tetrahydrocannabinol NONE DETECTED NONE DETECTED   Barbiturates NONE DETECTED NONE DETECTED    Comment: (NOTE) DRUG SCREEN FOR MEDICAL PURPOSES ONLY.  IF CONFIRMATION IS NEEDED FOR ANY PURPOSE, NOTIFY LAB WITHIN 5 DAYS.  LOWEST DETECTABLE LIMITS FOR URINE DRUG SCREEN Drug Class                     Cutoff (ng/mL) Amphetamine and metabolites    1000 Barbiturate and metabolites    200 Benzodiazepine                 200 Opiates and metabolites        300 Cocaine and metabolites        300 THC                            50 Performed at [facility], [place] [address], [place], [place] [address]   Acetaminophen level     Status: Abnormal   Collection Time: 10/25/21  5:32 AM  Result Value Ref Range   Acetaminophen (Tylenol), Serum <10 (L) 10 - 30 ug/mL    Comment: (NOTE) Therapeutic concentrations vary significantly. A range of 10-30 ug/mL  may be an effective concentration for many patients. However, some  are best treated at concentrations outside of this range. Acetaminophen concentrations >150 ug/mL at 4 hours after ingestion  and >50 ug/mL at 12 hours after ingestion are often associated with  toxic reactions.  Performed at [facility], [place] [address] [place], [place] [id number]   Salicylate level  Status: Abnormal   Collection Time: 10/25/21  5:32 AM  Result Value Ref Range   Salicylate Lvl Q000111Q (L) 7.0 - 30.0 mg/dL    Comment: Performed at [facility] [address] [place], [place] [address]  Ethanol     Status: None   Collection Time: 10/25/21  5:32 AM  Result Value Ref Range   Alcohol, Ethyl (B) <10 <10 mg/dL    Comment: (NOTE) Lowest detectable limit for serum alcohol is 10 mg/dL.  For medical purposes only. Performed at [facility], [place] [address] [place], [place] [address]     No current facility-administered medications for this encounter.   Current Outpatient Medications   Medication Sig Dispense Refill   amLODipine (NORVASC) 5 MG tablet Take 1 tablet (5 mg total) by mouth daily. 30 tablet 0   melatonin 3 MG TABS tablet Take 1 tablet (3 mg total) by mouth at bedtime as needed. 30 tablet 0   pantoprazole (PROTONIX) 40 MG tablet Take 1 tablet (40 mg total) by mouth daily. 30 tablet 0   valbenazine (INGREZZA) 40 MG capsule Take 1 capsule (40 mg total) by mouth daily. 30 capsule 0   ziprasidone (GEODON) 60 MG capsule Take 1 capsule (60 mg total) by mouth 2 (two) times daily with a meal. 60 capsule 0   Psychiatric Specialty Exam: Presentation  General Appearance:  Fairly Groomed  Eye Contact: Fair  Speech: Clear and Coherent  Speech Volume: Decreased  Handedness: Right   Mood and Affect  Mood: Anxious  Affect: Flat; Other (comment) (confused)   Thought Process  Thought Processes: Disorganized  Descriptions of Associations:Tangential  Orientation:Full (Time, Place and Person)  Thought Content:Scattered  History of Schizophrenia/Schizoaffective disorder:Yes  Duration of Psychotic Symptoms:Greater than six months  Hallucinations:Hallucinations: Auditory  Ideas of Reference:None  Suicidal Thoughts:Suicidal Thoughts: No  Homicidal Thoughts:Homicidal Thoughts: No   Sensorium  Memory: Immediate Fair; Recent Fair  Judgment: Impaired  Insight: Poor   Executive Functions  Concentration: Poor  Attention Span: Poor  Recall: Poor  Fund of Knowledge: Poor  Language: Fair   Psychomotor Activity  Psychomotor Activity: Psychomotor Activity: Normal   Assets  Assets: Communication Skills; Physical Health; Social Support    Sleep  Sleep: Sleep: Fair   Physical Exam: Physical Exam Neurological:     Mental Status: He is alert and oriented to person, place, and time.  Psychiatric:        Attention and Perception: He perceives auditory hallucinations.        Mood and Affect: Mood is anxious.        Speech:  Speech normal.        Behavior: Behavior is slowed and withdrawn.        Thought Content: Thought content normal.    Review of Systems  Psychiatric/Behavioral:  Positive for hallucinations.        Psychosis, confusion   Blood pressure (!) 149/98, pulse 77, temperature 98.1 F (36.7 C), temperature source Oral, resp. rate 17, SpO2 100 %. There is no height or weight on file to calculate BMI.  Medical Decision Making: Patient case reviewed and discussed with Dr. Dwyane Dee.  Patient does meet criteria for IVC and inpatient psychiatric admission.  EDP, RN, LCSW notified of disposition.  Patient has been accepted to [facility] inpatient treatment.  - Geodon 40 mg BID  - Risperdal 1 mg Qhs   Disposition: Recommend psychiatric Inpatient admission when medically cleared.  Vesta Mixer, NP 10/25/2021 10:55 AM

## 2021-10-25 NOTE — ED Notes (Signed)
GAVE A HAPPY MEAL WITH DRINK

## 2021-10-25 NOTE — ED Notes (Signed)
Belonging inventoried and placed in locker 5. IVC paperwork is located at [employer] with Network engineer.

## 2021-10-25 NOTE — Progress Notes (Signed)
Pt was accepted to [facility] 10/25/21; BED ASSIGNMENT 401-1  pending voluntary consent, improved VS, Covid PCR.   Please Fax IV [phone].  DX: Schizophrenia  Pt meets inpatient criteria per  Vesta Mixer NP  Attending Physician will be [place] MD  Report can be called to: Adult unit: [phone]  Pt can arrive after: PENDING ITEMS  Care Team notified: [facility] Lynnda Shields, RN, Vesta Mixer, NP, Christena Deem, RN, Leonia Reader, RN, Carmin Muskrat, MD, Esmond Plants, RN  Nadara Mode, [place] 10/25/2021 @ 11:49 AM

## 2021-10-25 NOTE — ED Notes (Signed)
3 copies of findings and custody report made and added to the copies of IVC paperwork. IVC paperwork and transfer consent form faxed to [phone]

## 2021-10-25 NOTE — Progress Notes (Signed)
NUTRITION ASSESSMENT  Pt identified as at risk on the Malnutrition Screen Tool  INTERVENTION: 1. Supplements: Ensure Plus High Protein po BID, each supplement provides 350 kcal and 20 grams of protein.  2. Multivitamin with minerals daily  NUTRITION DIAGNOSIS: Unintentional weight loss related to sub-optimal intake as evidenced by pt report.   Goal: Pt to meet >/= 90% of their estimated nutrition needs.  Monitor:  PO intake  Assessment:  Pt admitted with psychosis. Pt has been homeless for months. Likely inconsistent intakes. Per weight records, pt's weight had decreased since May 2023. Will order Ensure supplements and daily MVI.   Height: Ht Readings from Last 1 Encounters:  10/25/21 6\' 1"  (1.854 m)    Weight: Wt Readings from Last 1 Encounters:  10/25/21 66.2 kg    Weight Hx: Wt Readings from Last 10 Encounters:  10/25/21 66.2 kg  09/03/21 66.2 kg  07/21/21 70.2 kg  06/01/21 70.8 kg  05/30/21 72.6 kg  02/02/21 69.4 kg  02/02/21 68 kg  01/28/21 68 kg  12/28/20 68 kg  12/04/18 68 kg    BMI:  Body mass index is 19.26 kg/m. Pt meets criteria for normal based on current BMI.  Estimated Nutritional Needs: Kcal: 25-30 kcal/kg Protein: > 1 gram protein/kg Fluid: 1 ml/kcal  Diet Order:  Diet Order             Diet regular Room service appropriate? Yes; Fluid consistency: Thin  Diet effective now                  Pt is also offered choice of unit snacks mid-morning and mid-afternoon.  Pt is eating as desired.   Lab results and medications reviewed.   Clayton Bibles, MS, RD, LDN Inpatient Clinical Dietitian Contact information available via Amion

## 2021-10-26 ENCOUNTER — Encounter (HOSPITAL_COMMUNITY): Payer: Self-pay

## 2021-10-26 DIAGNOSIS — F200 Paranoid schizophrenia: Secondary | ICD-10-CM

## 2021-10-26 DIAGNOSIS — I10 Essential (primary) hypertension: Secondary | ICD-10-CM | POA: Insufficient documentation

## 2021-10-26 MED ORDER — PALIPERIDONE ER 6 MG PO TB24
6.0000 mg | ORAL_TABLET | Freq: Every day | ORAL | Status: DC
  Administered 2021-10-26 – 2021-10-27 (×2): 6 mg via ORAL
  Filled 2021-10-26 (×5): qty 1

## 2021-10-26 MED ORDER — PALIPERIDONE ER 3 MG PO TB24
3.0000 mg | ORAL_TABLET | Freq: Every day | ORAL | Status: DC
  Administered 2021-10-27 – 2021-10-30 (×4): 3 mg via ORAL
  Filled 2021-10-26 (×6): qty 1

## 2021-10-26 MED ORDER — TRAZODONE HCL 50 MG PO TABS: 50.0000 mg | ORAL_TABLET | Freq: Every evening | ORAL | Status: DC | PRN

## 2021-10-26 NOTE — Group Note (Signed)
Recreation Therapy Group Note   Group Topic:Stress Management  Group Date: 10/26/2021 Start Time: 0930 End Time: 0950 Facilitators: Giulliana Mcroberts-McCall, LRT,CTRS Location: [address] Dayroom   Goal Area(s) Addresses:  Patient will identify positive stress management techniques. Patient will identify benefits of using stress management post d/c.  Group Description:  Meditation.  LRT played a meditation that focused on having a mindful morning.  Patients were to listen and follow along as meditation played to fully engage in the group session.  Patients were to relax and get as comfortable as possible to get the full experience of the meditation.   Affect/Mood: N/A   Participation Level: Did not attend    Clinical Observations/Individualized Feedback:     Plan: Continue to engage patient in RT group sessions 2-3x/week.   Wesley Peterson, LRT,CTRS  10/26/2021 12:24 PM

## 2021-10-26 NOTE — Group Note (Signed)
LCSW Group Therapy Note   Group Date: 10/26/2021 Start Time: 1300 End Time: 1400  Type of Therapy and Topic:  Group Therapy:  Strengths Exploration   Participation Level: Did Not Attend  Description of Group: This group allows individuals to explore their strengths, learn to use strengths in new ways to improve well-being. Strengths-based interventions involve identifying strengths, understanding how they are used, and learning new ways to apply them. Individuals will identify their strengths, and then explore their roles in different areas of life (relationships, professional life, and personal fulfillment). Individuals will think about ways in which they currently use their strengths, along with new ways they could begin using them.    Therapeutic Goals Patient will verbalize two of their strengths Patient will identify how their strengths are currently used Patient will identify two new ways to apply their strengths  Patients will create a plan to apply their strengths in their daily lives     Summary of Patient Progress:  Did not attend       Therapeutic Modalities Cognitive [place], Latanya Presser 10/26/2021  1:55 PM

## 2021-10-26 NOTE — Progress Notes (Addendum)
Pt increasingly agitated suddenly . Pt came out of room and states '' I don't know what the fuck is going on but they are banging on the walls and fucking pissing me off. I am about to go off. ''  Patient then observed to make a phone call, slam phone down and began responding to internal stimuli. Initially pt offered medications and then he refused to take and cursing at staff stating '' they keep saying got em' got em. I don't want to take the medication because it isn't helping!''  Patient then began talking in incongruent statements, stating '' everytime you open your mouth you are making me climax, just shut up, you're making all the men climax just talking that's why they're coming to the med window!'' Pt observed to be grabbing his groin area while talking with writer but did accept prn medication and scheduled geodon with ensure. Pt then states he feels '' unsafe here. Because they could be coming to get me .''  Will con't to monitor to observe effectiveness of medication. Pt is safe.

## 2021-10-26 NOTE — Progress Notes (Signed)
Pt presents with depressed mood, disorganized thoughts. Wesley Peterson forwards very little, is very guarded, suspicious and withdrawn. When asking questions he really does not contribute much, shakes his head and will not elaborate to open ended questions. Pt does report he is having continued auditory hallucinations, but will not elaborate, and states '' I can't tell you what they are saying, mumbling. ''  He reports poor sleep last night and did not want to get up out of bed for am med pass and did not attend breakfast.  Ensure given to ensure caloric intake for geodon this am. Pt denies any SI or HI . Pt did not complete self inventory and thus far has remained isolative, although he is able to make his needs known. Pt is safe, will con't to monitor. Above discussed in treatment team.

## 2021-10-26 NOTE — [facility] Group Notes (Signed)
Pt did not attend group. 

## 2021-10-26 NOTE — [facility] Group Notes (Signed)
Patient did not attend NA meeting.  

## 2021-10-26 NOTE — BH IP Treatment Plan (Signed)
Interdisciplinary Treatment and Diagnostic Plan Update  10/26/2021 Time of Session: [place] MRN: [id number]  Principal Diagnosis: Schizophrenia [facility])  Secondary Diagnoses: Principal Problem:   Schizophrenia ([place])   Current Medications:  Current Facility-Administered Medications  Medication Dose Route Frequency Provider Last Rate Last Admin   acetaminophen (TYLENOL) tablet 650 mg  650 mg Oral Q6H PRN Vesta Mixer, NP       amLODipine (NORVASC) tablet 5 mg  5 mg Oral Daily Vesta Mixer, NP   5 mg at 10/26/21 4765   feeding supplement (ENSURE ENLIVE / ENSURE PLUS) liquid 237 mL  237 mL Oral BID BM Massengill, Ovid Curd, MD   237 mL at 10/26/21 1057   risperiDONE (RISPERDAL M-TABS) disintegrating tablet 2 mg  2 mg Oral Q8H PRN Massengill, Ovid Curd, MD       And   LORazepam (ATIVAN) tablet 1 mg  1 mg Oral Q6H PRN Massengill, Ovid Curd, MD       And   ziprasidone (GEODON) injection 20 mg  20 mg Intramuscular Q6H PRN Massengill, Ovid Curd, MD       melatonin tablet 3 mg  3 mg Oral QHS PRN Vesta Mixer, NP       multivitamin with minerals tablet 1 tablet  1 tablet Oral Daily Massengill, Nathan, MD   1 tablet at 10/26/21 0808   pantoprazole (PROTONIX) EC tablet 40 mg  40 mg Oral Daily Vesta Mixer, NP   40 mg at 10/26/21 0807   risperiDONE (RISPERDAL M-TABS) disintegrating tablet 1 mg  1 mg Oral QHS Vesta Mixer, NP   1 mg at 10/25/21 2128   valbenazine (INGREZZA) capsule 40 mg  40 mg Oral Daily Vesta Mixer, NP   40 mg at 10/26/21 0807   ziprasidone (GEODON) capsule 40 mg  40 mg Oral BID WC Vesta Mixer, NP   40 mg at 10/26/21 0807   PTA Medications: Medications Prior to Admission  Medication Sig Dispense Refill Last Dose   ziprasidone (GEODON) 60 MG capsule Take 1 capsule (60 mg total) by mouth 2 (two) times daily with a meal. 60 capsule 0    amLODipine (NORVASC) 5 MG tablet Take 1 tablet (5 mg total) by mouth daily. 30 tablet 0    melatonin 3 MG TABS tablet Take  1 tablet (3 mg total) by mouth at bedtime as needed. 30 tablet 0    pantoprazole (PROTONIX) 40 MG tablet Take 1 tablet (40 mg total) by mouth daily. 30 tablet 0    valbenazine (INGREZZA) 40 MG capsule Take 1 capsule (40 mg total) by mouth daily. 30 capsule 0     Patient Stressors: Financial difficulties   Medication change or noncompliance   Substance abuse    Patient Strengths: Ability for insight  Active sense of humor  Capable of independent living  Engineer, drilling fund of knowledge  Motivation for treatment/growth   Treatment Modalities: Medication Management, Group therapy, Case management,  1 to 1 session with clinician, Psychoeducation, Recreational therapy.   Physician Treatment Plan for Primary Diagnosis: Schizophrenia ([place]) Long Term Goal(s):     Short Term Goals:    Medication Management: Evaluate patient's response, side effects, and tolerance of medication regimen.  Therapeutic Interventions: 1 to 1 sessions, Unit Group sessions and Medication administration.  Evaluation of Outcomes: Progressing  Physician Treatment Plan for Secondary Diagnosis: Principal Problem:   Schizophrenia ([place])  Long Term Goal(s):     Short Term Goals:       Medication Management:  Evaluate patient's response, side effects, and tolerance of medication regimen.  Therapeutic Interventions: 1 to 1 sessions, Unit Group sessions and Medication administration.  Evaluation of Outcomes: Not Met   RN Treatment Plan for Primary Diagnosis: Schizophrenia ([place]) Long Term Goal(s): Knowledge of disease and therapeutic regimen to maintain health will improve  Short Term Goals: Ability to remain free from injury will improve, Ability to verbalize frustration and anger appropriately will improve, Ability to demonstrate self-control, Ability to participate in decision making will improve, Ability to verbalize feelings will improve, Ability to disclose and discuss  suicidal ideas, Ability to identify and develop effective coping behaviors will improve, and Compliance with prescribed medications will improve  Medication Management: RN will administer medications as ordered by provider, will assess and evaluate patient's response and provide education to patient for prescribed medication. RN will report any adverse and/or side effects to prescribing provider.  Therapeutic Interventions: 1 on 1 counseling sessions, Psychoeducation, Medication administration, Evaluate responses to treatment, Monitor vital signs and CBGs as ordered, Perform/monitor CIWA, COWS, AIMS and Fall Risk screenings as ordered, Perform wound care treatments as ordered.  Evaluation of Outcomes: Not Met   LCSW Treatment Plan for Primary Diagnosis: Schizophrenia ([place]) Long Term Goal(s): Safe transition to appropriate next level of care at discharge, Engage patient in therapeutic group addressing interpersonal concerns.  Short Term Goals: Engage patient in aftercare planning with referrals and resources, Increase social support, Increase ability to appropriately verbalize feelings, Increase emotional regulation, Facilitate acceptance of mental health diagnosis and concerns, Facilitate patient progression through stages of change regarding substance use diagnoses and concerns, Identify triggers associated with mental health/substance abuse issues, and Increase skills for wellness and recovery  Therapeutic Interventions: Assess for all discharge needs, 1 to 1 time with Social worker, Explore available resources and support systems, Assess for adequacy in community support network, Educate family and significant other(s) on suicide prevention, Complete Psychosocial Assessment, Interpersonal group therapy.  Evaluation of Outcomes: Not Met   Progress in Treatment: Attending groups: No. Participating in groups: No. Taking medication as prescribed: Yes. Toleration medication:  Yes. Family/Significant other contact made: CSW will obtain consent to reach collateral  Patient understands diagnosis: No. Discussing patient identified problems/goals with staff: Yes. Medical problems stabilized or resolved: Yes. Denies suicidal/homicidal ideation: No. Issues/concerns per patient self-inventory: Yes. Other: none  New problem(s) identified: No, Describe:  none  New Short Term/Long Term Goal(s): Patient to work towards detox, elimination of symptoms of psychosis, medication management for mood stabilization; elimination of SI thoughts; development of comprehensive mental wellness/sobriety plan.  Patient Goals:  Patient states their goal for treatment is to "I don't know."  Discharge Plan or Barriers: No psychosocial barriers identified at this time, patient to return to place of residence when appropriate for discharge.   Reason for Continuation of Hospitalization: Other; describe psychosis   Estimated Length of Stay: 1-7 days     Scribe for Treatment Team: Durenda Hurt, Latanya Presser 10/26/2021 1:20 PM
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
## 2021-10-26 NOTE — [facility] Suicide Risk Assessment (Addendum)
Suicide Risk Assessment  Admission Assessment    [facility] Admission Suicide Risk Assessment   Nursing information obtained from:  Review of record, Patient Demographic factors:  Male, Low socioeconomic status, Living alone Current Mental Status:  NA Loss Factors:  Decrease in vocational status, Loss of significant relationship Historical Factors:  Impulsivity Risk Reduction Factors:  Positive coping skills or problem solving skills  Total Time spent with patient: 30 minutes Principal Problem: Schizophrenia ([place]) Diagnosis:  Principal Problem:   Schizophrenia ([place]) Active Problems:   Tardive dyskinesia   Anxiety state   Insomnia   Essential hypertension, benign  History of Present Illness: Wesley Peterson is a 36 yo [place] American male with a prior mental health history of schizophrenia who presented to the [facility] with worsening psychosis and confusion in the context of medication noncompliance and methamphetamine abuse.  Patient was involuntarily committed and transferred to this [facility] for treatment and stabilization of his mental status.   Continued Clinical Symptoms: Depressive symptoms and psychosis requiring continous hospitalization for mood and thought disorder treatment and stabilization as current symptoms render pt at risk of danger to self.  Alcohol Use Disorder Identification Test Final Score (AUDIT): 1 The "Alcohol Use Disorders Identification Test", Guidelines for Use in Primary Care, Second Edition.  World Pharmacologist [facility]). Score between 0-7:  no or low risk or alcohol related problems. Score between 8-15:  moderate risk of alcohol related problems. Score between 16-19:  high risk of alcohol related problems. Score 20 or above:  warrants further diagnostic evaluation for alcohol dependence and treatment.  CLINICAL FACTORS:   Schizophrenia:   Depressive state Paranoid or undifferentiated type  Musculoskeletal: Strength &  Muscle Tone: within normal limits Gait & Station: normal Patient leans: N/A  Psychiatric Specialty Exam:  Presentation  General Appearance:  Appropriate for Environment; Fairly Groomed  Eye Contact: Fair  Speech: Slow  Speech Volume: Decreased  Handedness: Right   Mood and Affect  Mood: Depressed  Affect: Congruent   Thought Process  Thought Processes: Irrevelant  Descriptions of Associations:Tangential  Orientation:Partial  Thought Content:Illogical  History of Schizophrenia/Schizoaffective disorder:Yes  Duration of Psychotic Symptoms:Greater than six months  Hallucinations:Hallucinations: Auditory; Visual  Ideas of Reference:Paranoia; Delusions  Suicidal Thoughts:Suicidal Thoughts: No  Homicidal Thoughts:Homicidal Thoughts: No   Sensorium  Memory: Immediate Good  Judgment: Poor  Insight: Poor   Executive Functions  Concentration: Fair  Attention Span: Fair  Recall: [place] of Knowledge: Fair  Language: Fair   Psychomotor Activity  Psychomotor Activity: Psychomotor Activity: Normal  Assets  Assets: Communication Skills  Sleep  Sleep: Sleep: Poor  Physical Exam: Physical Exam Constitutional:      Appearance: Normal appearance.  HENT:     Head: Normocephalic.  Eyes:     Pupils: Pupils are equal, round, and reactive to light.  Cardiovascular:     Rate and Rhythm: Normal rate.  Pulmonary:     Effort: Pulmonary effort is normal.  Musculoskeletal:        General: Normal range of motion.     Cervical back: Normal range of motion.  Neurological:     Mental Status: He is alert.     Sensory: No sensory deficit.    Review of Systems  HENT: Negative.    Eyes: Negative.   Respiratory: Negative.    Cardiovascular: Negative.   Gastrointestinal: Negative.   Genitourinary: Negative.   Musculoskeletal: Negative.   Skin: Negative.   Neurological: Negative.   Psychiatric/Behavioral:  Positive  for depression,  hallucinations and substance abuse. Negative for memory loss and suicidal ideas. The patient is nervous/anxious and has insomnia.    Blood pressure 106/65, pulse 69, temperature 98.4 F (36.9 C), temperature source Oral, resp. rate 18, height 6\' 1"  (1.854 m), weight 66.2 kg, SpO2 99 %. Body mass index is 19.26 kg/m.  COGNITIVE FEATURES THAT CONTRIBUTE TO RISK:  None    SUICIDE RISK:   Mild:  Suicidal ideation of limited frequency, intensity, duration, and specificity.  There are no identifiable plans, no associated intent, mild dysphoria and related symptoms, good self-control (both objective and subjective assessment), few other risk factors, and identifiable protective factors, including available and accessible social support.  Expand All Collapse All   [place] Scale:  [place] Admission (Current) from 10/25/2021 in [place] 400B Most recent reading at 10/25/2021  2:00 PM ED from 10/25/2021 in [place] Most recent reading at 10/25/2021  5:30 AM Admission (Discharged) from 09/03/2021 in [place] 400B Most recent reading at 09/05/2021  8:00 AM  C-SSRS RISK CATEGORY No Risk No Risk No Risk           Prior Inpatient Therapy:   Prior Outpatient Therapy:     Alcohol Screening: 1. How often do you have a drink containing alcohol?: Monthly or less 2. How many drinks containing alcohol do you have on a typical day when you are drinking?: 1 or 2 3. How often do you have six or more drinks on one occasion?: Never AUDIT-C Score: 1 4. How often during the last year have you found that you were not able to stop drinking once you had started?: Never 5. How often during the last year have you failed to do what was normally expected from you because of drinking?: Never 6. How often during the last year have you needed a first drink in the morning to get yourself going after a heavy drinking  session?: Never 7. How often during the last year have you had a feeling of guilt of remorse after drinking?: Never 8. How often during the last year have you been unable to remember what happened the night before because you had been drinking?: Never 9. Have you or someone else been injured as a result of your drinking?: No 10. Has a relative or friend or a doctor or another health worker been concerned about your drinking or suggested you cut down?: No Alcohol Use Disorder Identification Test Final Score (AUDIT): 1 Alcohol Brief Interventions/Follow-up: Patient Refused Substance Abuse History in the last 12 months:  Yes.   Consequences of Substance Abuse: Worsening of mental health Previous Psychotropic Medications: Yes  Psychological Evaluations: No  Past Medical History:      Past Medical History:  Diagnosis Date   Schizophrenia ([place])     Schizophrenia, paranoid type ([place]) 02/02/2021   History reviewed. No pertinent surgical history. Family History: History reviewed. No pertinent family history. Family Psychiatric  History: not provided Tobacco Screening: denies use  Social History:  Social History        Substance and Sexual Activity  Alcohol Use Not Currently   Alcohol/week: 1.0 standard drink of alcohol   Types: 1 Cans of beer per week    Comment: occasional     Social History       Substance and Sexual Activity  Drug Use Never    Additional Social History:  Allergies:  No Known Allergies Lab Results:  Lab Results Last 48 Hours        Results for orders placed or performed during the hospital encounter of 10/25/21 (from the past 48 hour(s))  CBC     Status: None    Collection Time: 10/25/21  5:32 AM  Result Value Ref Range    WBC 5.6 4.0 - 10.5 K/uL    RBC 4.34 4.22 - 5.81 MIL/uL    Hemoglobin 13.7 13.0 - 17.0 g/dL    HCT 41.8 39.0 - 52.0 %    MCV 96.3 80.0 - 100.0 fL    MCH 31.6 26.0 - 34.0 pg    MCHC 32.8 30.0 - 36.0 g/dL    RDW 13.2 11.5 - 15.5 %     Platelets 281 150 - 400 K/uL    nRBC 0.0 0.0 - 0.2 %      Comment: Performed at [facility], [place] [address], [place], [place] [address]  Comprehensive metabolic panel     Status: Abnormal    Collection Time: 10/25/21  5:32 AM  Result Value Ref Range    Sodium 141 135 - 145 mmol/L    Potassium 4.0 3.5 - 5.1 mmol/L    Chloride 105 98 - 111 mmol/L    CO2 21 (L) 22 - 32 mmol/L    Glucose, Bld 76 70 - 99 mg/dL      Comment: Glucose reference range applies only to samples taken after fasting for at least 8 hours.    BUN 8 6 - 20 mg/dL    Creatinine, Ser 0.84 0.61 - 1.24 mg/dL    Calcium 10.2 8.9 - 10.3 mg/dL    Total Protein 7.1 6.5 - 8.1 g/dL    Albumin 4.5 3.5 - 5.0 g/dL    AST 18 15 - 41 U/L    ALT 12 0 - 44 U/L    Alkaline Phosphatase 57 38 - 126 U/L    Total Bilirubin 0.7 0.3 - 1.2 mg/dL    GFR, Estimated >60 >60 mL/min      Comment: (NOTE) Calculated using the CKD-EPI Creatinine Equation (2021)      Anion gap 15 5 - 15      Comment: Performed at [place] [address], [place], [place] [address]  Rapid urine drug screen (hospital performed)     Status: Abnormal    Collection Time: 10/25/21  5:32 AM  Result Value Ref Range    Opiates NONE DETECTED NONE DETECTED    Cocaine NONE DETECTED NONE DETECTED    Benzodiazepines NONE DETECTED NONE DETECTED    Amphetamines POSITIVE (A) NONE DETECTED    Tetrahydrocannabinol NONE DETECTED NONE DETECTED    Barbiturates NONE DETECTED NONE DETECTED      Comment: (NOTE) DRUG SCREEN FOR MEDICAL PURPOSES ONLY.  IF CONFIRMATION IS NEEDED FOR ANY PURPOSE, NOTIFY LAB WITHIN 5 DAYS.   LOWEST DETECTABLE LIMITS FOR URINE DRUG SCREEN Drug Class                     Cutoff (ng/mL) Amphetamine and metabolites    1000 Barbiturate and metabolites    200 Benzodiazepine                 200 Opiates and metabolites        300 Cocaine and metabolites        300 THC  50 Performed at [facility],  [place] [address] [place], [place] [address]    Acetaminophen level     Status: Abnormal    Collection Time: 10/25/21  5:32 AM  Result Value Ref Range    Acetaminophen (Tylenol), Serum <10 (L) 10 - 30 ug/mL      Comment: (NOTE) Therapeutic concentrations vary significantly. A range of 10-30 ug/mL  may be an effective concentration for many patients. However, some  are best treated at concentrations outside of this range. Acetaminophen concentrations >150 ug/mL at 4 hours after ingestion  and >50 ug/mL at 12 hours after ingestion are often associated with  toxic reactions.   Performed at [facility], [place] [address], [place], [place] [id number]    Salicylate level     Status: Abnormal    Collection Time: 10/25/21  5:32 AM  Result Value Ref Range    Salicylate Lvl [id number] (L) 7.0 - 30.0 mg/dL      Comment: Performed at [place] [address] [place], [place] [address]  Ethanol     Status: None    Collection Time: 10/25/21  5:32 AM  Result Value Ref Range    Alcohol, Ethyl (B) <10 <10 mg/dL      Comment: (NOTE) Lowest detectable limit for serum alcohol is 10 mg/dL.   For medical purposes only. Performed at [facility], [place] [address] [place], [place] [address]    SARS Coronavirus 2 by RT PCR (hospital order, performed in [facility] hospital lab) *cepheid single result test* Anterior Nasal Swab     Status: None    Collection Time: 10/25/21 10:51 AM    Specimen: Anterior Nasal Swab  Result Value Ref Range    SARS Coronavirus 2 by RT PCR NEGATIVE NEGATIVE      Comment: (NOTE) SARS-CoV-2 target nucleic acids are NOT DETECTED.   The SARS-CoV-2 RNA is generally detectable in upper and lower respiratory specimens during the acute phase of infection. The lowest concentration of SARS-CoV-2 viral copies this assay can detect is 250 copies / mL. A negative result does not preclude SARS-CoV-2 infection and should not be used as the sole basis for treatment or  other patient management decisions.  A negative result may occur with improper specimen collection / handling, submission of specimen other than nasopharyngeal swab, presence of viral mutation(s) within the areas targeted by this assay, and inadequate number of viral copies (<250 copies / mL). A negative result must be combined with clinical observations, patient history, and epidemiological information.   Fact Sheet for Patients:   https://www.patel.info/   Fact Sheet for Healthcare Providers: https://hall.com/   This test is not yet approved or  cleared by the [place] FDA and has been authorized for detection and/or diagnosis of SARS-CoV-2 by FDA under an Emergency Use Authorization (EUA).  This EUA will remain in effect (meaning this test can be used) for the duration of the COVID-19 declaration under Section 564(b)(1) of the Act, 21 U.S.C. section 360bbb-3(b)(1), unless the authorization is terminated or revoked sooner.   Performed at [facility], [place] [address], [place],  [address]        Blood Alcohol level:  Recent Labs       Lab Results  Component Value Date    [facility] <10 10/25/2021    ETH <10 [id number]      Metabolic Disorder Labs:  Recent Labs       Lab Results  Component Value Date    HGBA1C 5.9 (H) 06/02/2021    MPG 122.63 06/02/2021    MPG 126 02/03/2021      Recent Labs       Lab Results  Component Value Date    PROLACTIN 19.9 (H) 09/06/2021    PROLACTIN 17.5 (H) 06/02/2021      Recent Labs       Lab Results  Component Value Date    CHOL 137 06/02/2021    TRIG 105 06/02/2021    HDL 36 (L) 06/02/2021    CHOLHDL 3.8 06/02/2021    VLDL 21 06/02/2021    LDLCALC 80 06/02/2021    LDLCALC 80 01/30/2021      Current Medications:          Current Facility-Administered Medications  Medication Dose Route Frequency Provider Last Rate Last Admin   acetaminophen (TYLENOL) tablet 650 mg   650 mg Oral Q6H PRN Vesta Mixer, NP       amLODipine (NORVASC) tablet 5 mg  5 mg Oral Daily Vesta Mixer, NP   5 mg at 10/26/21 [id number]   feeding supplement (ENSURE ENLIVE / ENSURE PLUS) liquid 237 mL  237 mL Oral BID BM Massengill, Ovid Curd, MD   237 mL at 10/26/21 1358   risperiDONE (RISPERDAL M-TABS) disintegrating tablet 2 mg  2 mg Oral Q8H PRN Janine Limbo, MD   2 mg at 10/26/21 1633    And   LORazepam (ATIVAN) tablet 1 mg  1 mg Oral Q6H PRN Massengill, Ovid Curd, MD        And   ziprasidone (GEODON) injection 20 mg  20 mg Intramuscular Q6H PRN Massengill, Ovid Curd, MD       melatonin tablet 3 mg  3 mg Oral QHS PRN Vesta Mixer, NP       multivitamin with minerals tablet 1 tablet  1 tablet Oral Daily Massengill, Nathan, MD   1 tablet at 10/26/21 0808   [START ON 10/27/2021] paliperidone (INVEGA) 24 hr tablet 3 mg  3 mg Oral Daily [place] Schweppe, Tamela Oddi, NP       paliperidone (INVEGA) 24 hr tablet 6 mg  6 mg Oral QHS [place], NP       pantoprazole (PROTONIX) EC tablet 40 mg  40 mg Oral Daily Vesta Mixer, NP   40 mg at 10/26/21 [id number]   traZODone (DESYREL) tablet 50 mg  50 mg Oral QHS PRN Nicholes Rough, NP       valbenazine [facility]) capsule 40 mg  40 mg Oral Daily Vesta Mixer, NP   40 mg at 10/26/21 0807    PTA Medications:        Medications Prior to Admission  Medication Sig Dispense Refill Last Dose   ziprasidone (GEODON) 60 MG capsule Take 1 capsule (60 mg total) by mouth 2 (two) times daily with a meal. 60 capsule 0     amLODipine (NORVASC) 5 MG tablet Take 1 tablet (5 mg total) by mouth daily. 30 tablet 0     melatonin 3 MG TABS tablet Take 1 tablet (3 mg total) by mouth at bedtime as needed. 30 tablet 0     pantoprazole (PROTONIX) 40 MG tablet Take 1 tablet (40 mg total) by mouth daily. 30 tablet 0     valbenazine (INGREZZA) 40 MG capsule Take 1 capsule (40 mg total) by mouth daily. 30 capsule 0      Musculoskeletal: Strength & Muscle Tone: within normal  limits Gait & Station: normal Patient leans: N/A Psychiatric Specialty  Exam:   Presentation  General Appearance:  Appropriate for Environment; Fairly Groomed   Eye Contact: Fair   Speech: Slow   Speech Volume: Decreased   Handedness: Right     Mood and Affect  Mood: Depressed   Affect: Congruent     Thought Process  Thought Processes: Irrevelant   Duration of Psychotic Symptoms: Greater than six months   Past Diagnosis of Schizophrenia or Psychoactive disorder: Yes   Descriptions of Associations:Tangential   Orientation:Partial   Thought Content:Illogical   Hallucinations:Hallucinations: Auditory; Visual   Ideas of Reference:Paranoia; Delusions   Suicidal Thoughts:Suicidal Thoughts: No   Homicidal Thoughts:Homicidal Thoughts: No     Sensorium  Memory: Immediate Good   Judgment: Poor   Insight: Poor     Executive Functions  Concentration: Fair   Attention Span: Fair   Recall: [place] of Knowledge: Fair   Language: Fair     Psychomotor Activity  Psychomotor Activity: Psychomotor Activity: Normal     Assets  Assets: Communication Skills     Sleep  Sleep: Sleep: Poor       Physical Exam: Physical Exam Constitutional:      Appearance: Normal appearance.  Eyes:     Pupils: Pupils are equal, round, and reactive to light.  Musculoskeletal:     Cervical back: Normal range of motion.  Neurological:     Mental Status: He is alert and oriented to person, place, and time.      Review of Systems  Constitutional:  Negative for fever.  HENT:  Negative for sore throat.   Eyes: Negative.   Respiratory:  Negative for cough.   Cardiovascular:  Negative for chest pain.  Gastrointestinal:  Negative for heartburn and nausea.  Genitourinary: Negative.   Musculoskeletal: Negative.   Skin: Negative.   Neurological: Negative.  Negative for dizziness.  Psychiatric/Behavioral:  Positive for depression, hallucinations and  substance abuse. Negative for memory loss and suicidal ideas. The patient is nervous/anxious and has insomnia.     Blood pressure 106/65, pulse 69, temperature 98.4 F (36.9 C), temperature source Oral, resp. rate 18, height 6\' 1"  (1.854 m), weight 66.2 kg, SpO2 99 %. Body mass index is 19.26 kg/m.   Treatment Plan Summary: Daily contact with patient to assess and evaluate symptoms and progress in treatment and Medication management   Observation Level/Precautions:  15 minute checks  Laboratory:  Labs reviewed   Psychotherapy:  Unit Group sessions  Medications:  See [facility]  Consultations:  To be determined   Discharge Concerns:  Safety, medication compliance, mood stability  Estimated LOS: 5-7 days  Other:  N/A    PLAN Safety and Monitoring: Voluntary admission to inpatient psychiatric unit for safety, stabilization and treatment Daily contact with patient to assess and evaluate symptoms and progress in treatment Patient's case to be discussed in multi-disciplinary team meeting Observation Level : q15 minute checks Vital signs: q12 hours Precautions: Safety   Long Term Goal(s): Improvement in symptoms so as ready for discharge   Short Term Goals: Ability to verbalize feelings will improve, Ability to disclose and discuss suicidal ideas, Ability to identify and develop effective coping behaviors will improve, Compliance with prescribed medications will improve, and Ability to identify triggers associated with substance abuse/mental health issues will improve   Diagnoses:  Principal Problem:   Schizophrenia ([place]) Active Problems:   Tardive dyskinesia   Anxiety state   Insomnia   Essential hypertension, benign   Schizophrenia -Discontinue Geodon d/t lack of efficacy -Start [employer]  3 mg every morning starting 10/19 at 0800 -Start Invega 6 mg nightly starting tonight 10/18 @ 2200 for psychosis with plan to give LAI prior to discharge -Continue Ingrezza 40 mg daily for EPS    Anxiety -Continue Hydroxyzine 25 mg every 6 hours PRN   Insomnia -Start melatonin 3 mg nightly  -Start Trazodone 50 mg nightly PRN   Hypertension -Continue Norvasc 5 mg daily   Other PRNS -Continue Tylenol 650 mg every 6 hours PRN for mild pain -Continue Maalox 30 mg every 4 hrs PRN for indigestion -Continue Milk of Magnesia as needed every 6 hrs for constipation   Discharge Planning: Social work and case management to assist with discharge planning and identification of hospital follow-up needs prior to discharge Estimated LOS: 5-7 days Discharge Concerns: Need to establish a safety plan; Medication compliance and effectiveness Discharge Goals: Return home with outpatient referrals for mental health follow-up including medication management/psychotherapy      I certify that inpatient services furnished can reasonably be expected to improve the patient's condition.   Nicholes Rough, NP 10/26/2021, 5:36 PM

## 2021-10-27 ENCOUNTER — Other Ambulatory Visit (HOSPITAL_COMMUNITY): Payer: Self-pay

## 2021-10-27 DIAGNOSIS — F251 Schizoaffective disorder, depressive type: Secondary | ICD-10-CM

## 2021-10-27 LAB — HEMOGLOBIN A1C
Hgb A1c MFr Bld: 5.7 % — ABNORMAL HIGH (ref 4.8–5.6)
Mean Plasma Glucose: 116.89 mg/dL

## 2021-10-27 MED ORDER — PALIPERIDONE PALMITATE ER 234 MG/1.5ML IM SUSY
PREFILLED_SYRINGE | INTRAMUSCULAR | 0 refills | Status: DC
  Filled 2021-10-27: qty 1.5, 30d supply, fill #0
  Filled 2021-10-27: qty 1.5, 22d supply, fill #0

## 2021-10-27 NOTE — Progress Notes (Signed)
[facility] MD Progress Note  10/27/2021 1:08 PM Wesley Peterson  MRN:  [id number] Subjective:   HPI: Wesley Peterson is a 36 year old male with past psychiatric history of schizoaffective disorder bipolar type, tardive dyskinesia, insomnia, suicidal ideation, psychosis, auditory hallucinations, anxiety who initially presented to [facility] 10/25/21 with complaint of head injury, unable to state where he was noted to be disorganized and paranoid requiring frequent redirection. Upon receiving collateral information from father it was noted patient had not been compliant with psychotropic medications (Geodon) and made suicidal ideations day prior without a specific plan. Patient was IVC'd and later transferred to [facility] for further stabilization. UDS+amphetamine (active Rx for Adderall noted in PDMP). BAL<10.   Past 24 hours: Patient noted isolative with depressed mood, disorganized thoughts with ongoing auditory hallucinations. X1 episode of agitation due to auditory hallucinations requiring PRN medication. Not attending groups or participating in any unit activities; eating meals in his room. Medications adjusted: Geodon 40 mg BID w/ meals (discontinued), Paliperidone 3 mg daily, valbenazine 40 mg daily. Refused to come out for am medications; given late in his room as a result.   Assessment: Patient presents laying in bed. Withdrawn. Irritable on approach. He reports currently living on the streets. States he hasn't been taking his medications, mentions Geodon. Reports ongoing auditory hallucinations of people yelling "We got him" and thoughts of paranoia regarding someone being after him. He denies any active suicidal or homicidal ideations. Reports "not feeling safe" at this time. Discussed current medications regimen and LAI; he is refusing LAI at this time. Begins to get irritable with questions during assessment; assessment completed. No current outpatient supports noted at this time.   Principal Problem: Schizoaffective  disorder, depressive type ([place]) Diagnosis: Principal Problem:   Schizoaffective disorder, depressive type ([place]) Active Problems:   Schizophrenia, paranoid ([employer])   Schizophrenia ([place])   Tardive dyskinesia   Anxiety state   Insomnia   Essential hypertension, benign  Total Time spent with patient: 30 minutes  Past Psychiatric History: schizoaffective disorder bipolar type, tardive dyskinesia, insomnia, suicidal ideation, psychosis, auditory hallucinations, anxiety; multiple inpatient admissions  Past Medical History:  Past Medical History:  Diagnosis Date   Schizophrenia ([place])    Schizophrenia, paranoid type ([place]) 02/02/2021   History reviewed. No pertinent surgical history. Family History: History reviewed. No pertinent family history. Family Psychiatric  History: not noted Social History:  Social History   Substance and Sexual Activity  Alcohol Use Not Currently   Alcohol/week: 1.0 standard drink of alcohol   Types: 1 Cans of beer per week   Comment: occasional     Social History   Substance and Sexual Activity  Drug Use Never    Social History   Socioeconomic History   Marital status: Single    Spouse name: Not on file   Number of children: 1   Years of education: Not on file   Highest education level: Not on file  Occupational History   Occupation: Unemployed  Tobacco Use   Smoking status: Never    Passive exposure: Never   Smokeless tobacco: Current   Tobacco comments:    Vaping nicotine   Vaping Use   Vaping Use: Not on file  Substance and Sexual Activity   Alcohol use: Not Currently    Alcohol/week: 1.0 standard drink of alcohol    Types: 1 Cans of beer per week    Comment: occasional   Drug use: Never   Sexual activity: Never  Other Topics Concern   Not  on file  Social History Narrative   ** Merged History Encounter **       Pt lives with mother and other relatives.  Currently unemployed.  Receives outpatient psychiatry services through [facility].    Social Determinants of Health   Financial Resource Strain: Not on file  Food Insecurity: Food Insecurity Present (10/25/2021)   Hunger Vital Sign    Worried About Running Out of Food in the Last Year: Often true    Ran Out of Food in the Last Year: Often true  Transportation Needs: Unmet Transportation Needs (10/25/2021)   PRAPARE - Administrator, Civil Service (Medical): Yes    Lack of Transportation (Non-Medical): Yes  Physical Activity: Not on file  Stress: Not on file  Social Connections: Not on file   Additional Social History:    Sleep: Fair  Appetite:  Fair  Current Medications: Current Facility-Administered Medications  Medication Dose Route Frequency Provider Last Rate Last Admin   acetaminophen (TYLENOL) tablet 650 mg  650 mg Oral Q6H PRN Eligha Bridegroom, NP       amLODipine (NORVASC) tablet 5 mg  5 mg Oral Daily Eligha Bridegroom, NP   5 mg at 10/26/21 4825   feeding supplement (ENSURE ENLIVE / ENSURE PLUS) liquid 237 mL  237 mL Oral BID BM Massengill, Harrold Donath, MD   237 mL at 10/26/21 1358   risperiDONE (RISPERDAL M-TABS) disintegrating tablet 2 mg  2 mg Oral Q8H PRN Massengill, Harrold Donath, MD   2 mg at 10/26/21 1633   And   LORazepam (ATIVAN) tablet 1 mg  1 mg Oral Q6H PRN Massengill, Harrold Donath, MD       And   ziprasidone (GEODON) injection 20 mg  20 mg Intramuscular Q6H PRN Massengill, Harrold Donath, MD       melatonin tablet 3 mg  3 mg Oral QHS PRN Eligha Bridegroom, NP       multivitamin with minerals tablet 1 tablet  1 tablet Oral Daily Massengill, Nathan, MD   1 tablet at 10/26/21 0037   paliperidone (INVEGA) 24 hr tablet 3 mg  3 mg Oral Daily Starleen Blue, NP       paliperidone (INVEGA) 24 hr tablet 6 mg  6 mg Oral QHS Nkwenti, Doris, NP   6 mg at 10/26/21 2135   pantoprazole (PROTONIX) EC tablet 40 mg  40 mg Oral Daily Eligha Bridegroom, NP   40 mg at 10/26/21 0488   traZODone (DESYREL) tablet 50 mg  50 mg Oral QHS PRN Starleen Blue, NP       valbenazine  (INGREZZA) capsule 40 mg  40 mg Oral Daily Eligha Bridegroom, NP   40 mg at 10/26/21 8916    Lab Results:  Results for orders placed or performed during the hospital encounter of 10/25/21 (from the past 48 hour(s))  Hemoglobin A1c     Status: Abnormal   Collection Time: 10/27/21  6:47 AM  Result Value Ref Range   Hgb A1c MFr Bld 5.7 (H) 4.8 - 5.6 %    Comment: (NOTE) Pre diabetes:          5.7%-6.4%  Diabetes:              >6.4%  Glycemic control for   <7.0% adults with diabetes    Mean Plasma Glucose 116.89 mg/dL    Comment: Performed at [facility], [address], [place], [place] [address]    Blood Alcohol level:  Lab Results  Component Value Date  ETH <10 10/25/2021   ETH <10 08/31/2021    Metabolic Disorder Labs: Lab Results  Component Value Date   HGBA1C 5.7 (H) 10/27/2021   MPG 116.89 10/27/2021   MPG 122.63 06/02/2021   Lab Results  Component Value Date   PROLACTIN 19.9 (H) 09/06/2021   PROLACTIN 17.5 (H) 06/02/2021   Lab Results  Component Value Date   CHOL 137 06/02/2021   TRIG 105 06/02/2021   HDL 36 (L) 06/02/2021   CHOLHDL 3.8 06/02/2021   VLDL 21 06/02/2021   LDLCALC 80 06/02/2021   LDLCALC 80 01/30/2021    Physical Findings: AIMS:  , ,  ,  ,    CIWA:    COWS:     Musculoskeletal: Strength & Muscle Tone: within normal limits Gait & Station: normal Patient leans: N/A  Psychiatric Specialty Exam:  Presentation  General Appearance:  Casual  Eye Contact: Fleeting  Speech: Slow  Speech Volume: Decreased  Handedness: Right   Mood and Affect  Mood: Dysphoric  Affect: Restricted; Congruent   Thought Process  Thought Processes: Irrevelant  Descriptions of Associations:Loose  Orientation:Partial  Thought Content:Illogical  History of Schizophrenia/Schizoaffective disorder:Yes  Duration of Psychotic Symptoms:Greater than six months  Hallucinations:Hallucinations: Auditory  Ideas of  Reference:Delusions  Suicidal Thoughts:Suicidal Thoughts: No  Homicidal Thoughts:Homicidal Thoughts: No   Sensorium  Memory: Immediate Fair  Judgment: Poor  Insight: Poor; Shallow   Executive Functions  Concentration: Poor  Attention Span: Fair  Recall: [employer] of Knowledge: Fair  Language: Fair   Psychomotor Activity  Psychomotor Activity: Psychomotor Activity: Normal   Assets  Assets: Resilience; Physical Health   Sleep  Sleep: Sleep: Fair Number of Hours of Sleep: 5.25    Physical Exam: Physical Exam Vitals and nursing note reviewed.  Constitutional:      Appearance: He is normal weight.  HENT:     Head: Normocephalic.     Nose: Nose normal.     Mouth/Throat:     Pharynx: Oropharynx is clear.  Eyes:     Pupils: Pupils are equal, round, and reactive to light.  Cardiovascular:     Pulses: Normal pulses.  Pulmonary:     Effort: Pulmonary effort is normal.  Abdominal:     Palpations: Abdomen is soft.  Musculoskeletal:        General: Normal range of motion.  Skin:    General: Skin is dry.  Neurological:     Mental Status: He is oriented to person, place, and time.  Psychiatric:        Attention and Perception: He perceives auditory hallucinations.        Mood and Affect: Mood is depressed. Affect is flat.        Speech: He is noncommunicative.        Behavior: Behavior is withdrawn.        Thought Content: Thought content is delusional. Thought content does not include homicidal or suicidal ideation. Thought content does not include homicidal or suicidal plan.        Cognition and Memory: Cognition normal.    Review of Systems  Psychiatric/Behavioral:  Positive for depression and hallucinations.   All other systems reviewed and are negative.  Blood pressure 115/78, pulse (!) 106, temperature 98.3 F (36.8 C), temperature source Oral, resp. rate 18, height 6\' 1"  (1.854 m), weight 66.2 kg, SpO2 99 %. Body mass index is 19.26  kg/m.   Treatment Plan Summary: Daily contact with patient to assess and evaluate symptoms and progress  in treatment, Medication management, and Plan :   PLAN Safety and Monitoring: Voluntary admission to inpatient psychiatric unit for safety, stabilization and treatment Daily contact with patient to assess and evaluate symptoms and progress in treatment Patient's case to be discussed in multi-disciplinary team meeting Observation Level : q15 minute checks Vital signs: q12 hours Precautions: Safety   Long Term Goal(s): Improvement in symptoms so as ready for discharge   Short Term Goals: Ability to identify changes in lifestyle to reduce recurrence of condition will improve, Ability to disclose and discuss suicidal ideas, Ability to demonstrate self-control will improve, Ability to identify and develop effective coping behaviors will improve, Compliance with prescribed medications will improve, and Ability to identify triggers associated with substance abuse/mental health issues will improve.  2. Medications:     Schizoaffective disorder, depressive type (HCC)  -Start:    -Paliperidone 6 mg daily HS: psychotic symptoms     Tardive dyskinesia:   - Continue:    - Valbenazine 40 mg daily: TD symptoms     Insomnia  - Continue:    - Trazodone 50 mg HS PRN: insomnia    Essential hypertension, benign  - Continue:    -Amlodipine 5 mg daily: HTN  Other PRNS -Continue Tylenol 650 mg every 6 hours PRN for mild pain -Continue Maalox 30 mg every 4 hrs PRN for indigestion -Continue Milk of Magnesia as needed every 6 hrs for constipation   3. Discharge Planning: Social work and case management to assist with discharge planning and identification of hospital follow-up needs prior to discharge Estimated LOS: 5-7 days Discharge Concerns: Need to establish a safety plan; Medication compliance and effectiveness Discharge Goals: Return home with outpatient referrals for mental health  follow-up including medication management/psychotherapy.  Loletta Parish, NP 10/27/2021, 1:08 PM

## 2021-10-27 NOTE — Progress Notes (Signed)
CSW provided the Pt with a packet that contains information including shelter and housing resources, free and reduced price food information, clothing resources, crisis center information, a Good[employer] card, and suicide prevention information.   

## 2021-10-27 NOTE — Progress Notes (Deleted)
CSW provided the Pt with a packet that contains information including shelter and housing resources, free and reduced price food information, clothing resources, crisis center information, a GoodRX card, and suicide prevention information.   

## 2021-10-27 NOTE — Progress Notes (Signed)
   10/27/21 0000  Psych Admission Type (Psych Patients Only)  Admission Status Involuntary  Psychosocial Assessment  Patient Complaints None  Eye Contact Avertive  Facial Expression Flat  Affect Flat  Speech Soft  Interaction Cautious  Motor Activity Slow  Appearance/Hygiene Disheveled  Behavior Characteristics Guarded  Mood Depressed;Suspicious  Thought Process  Coherency Blocking;Disorganized  Content Ambivalence  Delusions None reported or observed  Perception Hallucinations  Hallucination Auditory  Judgment Poor  Confusion Moderate  Danger to Self  Current suicidal ideation? Denies  Agreement Not to Harm Self Yes  Description of Agreement verbal  Danger to Others  Danger to Others None reported or observed  Danger to Others Abnormal  Harmful Behavior to others No threats or harm toward other people  Destructive Behavior No threats or harm toward property

## 2021-10-27 NOTE — Progress Notes (Signed)
   10/27/21 1600  Psych Admission Type (Psych Patients Only)  Admission Status Involuntary  Psychosocial Assessment  Patient Complaints None  Eye Contact Avertive  Facial Expression Flat  Affect Flat  Speech Soft  Interaction Cautious;Minimal  Motor Activity Slow  Appearance/Hygiene Disheveled  Behavior Characteristics Guarded  Mood Depressed;Suspicious;Irritable  Thought Process  Coherency Blocking;Disorganized  Content Ambivalence  Delusions None reported or observed  Perception Hallucinations  Hallucination Auditory  Judgment Poor  Confusion Moderate  Danger to Self  Current suicidal ideation? Denies  Agreement Not to Harm Self Yes  Description of Agreement verbal  Danger to Others  Danger to Others None reported or observed  Danger to Others Abnormal  Harmful Behavior to others No threats or harm toward other people  Destructive Behavior No threats or harm toward property

## 2021-10-27 NOTE — Progress Notes (Signed)
[facility] Notes:  (Nursing/MHT/Case Management/Adjunct)  Date:  10/27/2021  Time:  2000  Type of Therapy:   wrap up group  Participation Level:  Active  Participation Quality:  Appropriate, Attentive, and Sharing  Affect:  Appropriate  Cognitive:  Alert  Insight:  Improving  Engagement in Group:  Developing/Improving  Modes of Intervention:  Clarification, Education, and Support  Summary of Progress/Problems: Positive thinking and positive change were discussed.   Shellia Cleverly 10/27/2021, 8:57 PM

## 2021-10-27 NOTE — BHH Counselor (Signed)
CSW spoke with Annie Main from [facility] who states that the Pt was interviewed since the last referral but has not started services yet.  Annie Main asked for another referral to be sent by fax.  He states that he will contact [facility] on 10/28/2021 to follow-up with the new referral.  CSW will continue to work with the Pt to see if [place] is something that he will agree to after discharge.  If the Pt does not plan to remain in [facility] and wishes to return to [place], [place] then he will need to seek ACTT services at [facility] in [place]. A referral will also be made to this agency.

## 2021-10-28 ENCOUNTER — Other Ambulatory Visit (HOSPITAL_COMMUNITY): Payer: Self-pay

## 2021-10-28 DIAGNOSIS — F200 Paranoid schizophrenia: Secondary | ICD-10-CM | POA: Diagnosis not present

## 2021-10-28 MED ORDER — TRAZODONE HCL 50 MG PO TABS
50.0000 mg | ORAL_TABLET | Freq: Every day | ORAL | Status: DC
  Administered 2021-10-28 – 2021-10-31 (×4): 50 mg via ORAL
  Filled 2021-10-28 (×5): qty 1
  Filled 2021-10-28: qty 7
  Filled 2021-10-28: qty 1

## 2021-10-28 MED ORDER — PALIPERIDONE ER 6 MG PO TB24
9.0000 mg | ORAL_TABLET | Freq: Every day | ORAL | Status: DC
  Administered 2021-10-28 – 2021-10-29 (×2): 9 mg via ORAL
  Filled 2021-10-28 (×3): qty 1
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
## 2021-10-28 NOTE — Progress Notes (Signed)
[facility] MD Progress Note  10/28/2021 1:58 PM Wesley Peterson  MRN:  [id number] Subjective:    History of Present Illness: Wesley Peterson is a 36 yo [place] American male with a prior mental health history of schizophrenia who presented to the [facility] with worsening psychosis and confusion in the context of medication noncompliance and methamphetamine abuse.  Patient was involuntarily committed and transferred to this [facility] for treatment and stabilization of his mental status.   Past 24 hours: V/S WNL for past 24hrs, pt remains compliant with scheduled medications, did not require any agitation protocol medications or anti anxiety meds in the past 24 hrs. Pt attended unit group activity last night, and remained isolative to his room earlier today morning.  Today's patient assessment note: Today, pt continues to present with psychosis; reports that he is still hearing voices, but states that he is not able to make out what they are saying. He however states that the voices are multiple, and have not changed in volume or intensity since admission. Pt also presents with thought broadcasting and states that non specific people know what he is thinking. He also presents with paranoia, and states that there are nonspecific people outside of this [facility] that are out to get him.   Pt with flat affect and depressed mood, and affect is congruent. Attention to personal hygiene and grooming is poor, and the need to tend to personal hygiene and grooming reiterated. Eye contact is poor, and pt talks to Clinical research associate with eyes closed, and continues to have poverty of speech using one word responses and short phrases. Pt denies SI, denies VH, denies HI. He denies any medication related side effects. Pt reports a fair sleep quality last night and a fair appetite. No TD/EPS type symptoms found on assessment, and pt denies any feelings of stiffness. AIMS: 0.   We are continuing Invega and increasing  night time dose to 9 mg nightly and continuing 3 mg in the mornings  for psychosis. We will schedule Trazodone instead of PRN d/t complaints of sleep being fair instead of good. We will continue other medications listed below. Writer educated pt on the fact than it will be necessary to transition to an LAI medication for management of his psychosis due to his history of medication non compliance, but he is currently resistant to the idea. We will continue  to revisit this every day.  Labs reviewed: HA1C is 5.7 rendering pt a prediabetic, educated on healthy food choices, but not in the mental state at this time to comprehend education. Will need education reinforcement as well as PCP f/u after discharge.  Principal Problem: Schizoaffective disorder, depressive type (HCC) Diagnosis: Principal Problem:   Schizoaffective disorder, depressive type (HCC) Active Problems:   Schizophrenia, paranoid (HCC)   Schizophrenia (HCC)   Tardive dyskinesia   Anxiety state   Insomnia   Essential hypertension, benign  Total Time spent with patient: 30 minutes  Past Psychiatric History: schizoaffective disorder bipolar type, tardive dyskinesia, insomnia, suicidal ideation, psychosis, auditory hallucinations, anxiety; multiple inpatient admissions  Past Medical History:  Past Medical History:  Diagnosis Date   Schizophrenia (HCC)    Schizophrenia, paranoid type (HCC) 02/02/2021   History reviewed. No pertinent surgical history. Family History: History reviewed. No pertinent family history. Family Psychiatric  History: not noted Social History:  Social History   Substance and Sexual Activity  Alcohol Use Not Currently   Alcohol/week: 1.0 standard drink of alcohol   Types: 1  Cans of beer per week   Comment: occasional     Social History   Substance and Sexual Activity  Drug Use Never    Social History   Socioeconomic History   Marital status: Single    Spouse name: Not on file   Number of  children: 1   Years of education: Not on file   Highest education level: Not on file  Occupational History   Occupation: Unemployed  Tobacco Use   Smoking status: Never    Passive exposure: Never   Smokeless tobacco: Current   Tobacco comments:    Vaping nicotine   Vaping Use   Vaping Use: Not on file  Substance and Sexual Activity   Alcohol use: Not Currently    Alcohol/week: 1.0 standard drink of alcohol    Types: 1 Cans of beer per week    Comment: occasional   Drug use: Never   Sexual activity: Never  Other Topics Concern   Not on file  Social History Narrative   ** Merged History Encounter **       Pt lives with mother and other relatives.  Currently unemployed.  Receives outpatient psychiatry services through [facility].   Social Determinants of Health   Financial Resource Strain: Not on file  Food Insecurity: Food Insecurity Present (10/25/2021)   Hunger Vital Sign    Worried About Running Out of Food in the Last Year: Often true    Ran Out of Food in the Last Year: Often true  Transportation Needs: Unmet Transportation Needs (10/25/2021)   PRAPARE - Administrator, Civil Service (Medical): Yes    Lack of Transportation (Non-Medical): Yes  Physical Activity: Not on file  Stress: Not on file  Social Connections: Not on file   Additional Social History:    Sleep: Fair  Appetite:  Fair  Current Medications: Current Facility-Administered Medications  Medication Dose Route Frequency Provider Last Rate Last Admin   acetaminophen (TYLENOL) tablet 650 mg  650 mg Oral Q6H PRN Eligha Bridegroom, NP       amLODipine (NORVASC) tablet 5 mg  5 mg Oral Daily Eligha Bridegroom, NP   5 mg at 10/28/21 0815   feeding supplement (ENSURE ENLIVE / ENSURE PLUS) liquid 237 mL  237 mL Oral BID BM Massengill, Harrold Donath, MD   237 mL at 10/28/21 1045   risperiDONE (RISPERDAL M-TABS) disintegrating tablet 2 mg  2 mg Oral Q8H PRN Massengill, Harrold Donath, MD   2 mg at 10/26/21 1633   And    LORazepam (ATIVAN) tablet 1 mg  1 mg Oral Q6H PRN Massengill, Harrold Donath, MD       And   ziprasidone (GEODON) injection 20 mg  20 mg Intramuscular Q6H PRN Massengill, Nathan, MD       melatonin tablet 3 mg  3 mg Oral QHS PRN Eligha Bridegroom, NP       multivitamin with minerals tablet 1 tablet  1 tablet Oral Daily Massengill, Nathan, MD   1 tablet at 10/28/21 0815   paliperidone (INVEGA) 24 hr tablet 3 mg  3 mg Oral Daily Uma Jerde, NP   3 mg at 10/28/21 0815   paliperidone (INVEGA) 24 hr tablet 9 mg  9 mg Oral QHS Mallery Harshman, NP       pantoprazole (PROTONIX) EC tablet 40 mg  40 mg Oral Daily Eligha Bridegroom, NP   40 mg at 10/28/21 0815   traZODone (DESYREL) tablet 50 mg  50 mg Oral QHS Starleen Blue, NP  valbenazine (INGREZZA) capsule 40 mg  40 mg Oral Daily Eligha Bridegroom, NP   40 mg at 10/28/21 3299   Lab Results:  Results for orders placed or performed during the hospital encounter of 10/25/21 (from the past 48 hour(s))  Hemoglobin A1c     Status: Abnormal   Collection Time: 10/27/21  6:47 AM  Result Value Ref Range   Hgb A1c MFr Bld 5.7 (H) 4.8 - 5.6 %    Comment: (NOTE) Pre diabetes:          5.7%-6.4%  Diabetes:              >6.4%  Glycemic control for   <7.0% adults with diabetes    Mean Plasma Glucose 116.89 mg/dL    Comment: Performed at [facility], [address] [place], [place] [address]   Blood Alcohol level:  Lab Results  Component Value Date   [facility] <10 10/25/2021   ETH <10 08/31/2021   Metabolic Disorder Labs: Lab Results  Component Value Date   HGBA1C 5.7 (H) 10/27/2021   MPG 116.89 10/27/2021   MPG 122.63 06/02/2021   Lab Results  Component Value Date   PROLACTIN 19.9 (H) 09/06/2021   PROLACTIN 17.5 (H) 06/02/2021   Lab Results  Component Value Date   CHOL 137 06/02/2021   TRIG 105 06/02/2021   HDL 36 (L) 06/02/2021   CHOLHDL 3.8 06/02/2021   VLDL 21 06/02/2021   LDLCALC 80 06/02/2021   LDLCALC 80 01/30/2021   Physical  Findings: AIMS:  0 ,  ,  ,    CIWA:n/a    COWS: n/a   Musculoskeletal: Strength & Muscle Tone: within normal limits Gait & Station: normal Patient leans: N/A  Psychiatric Specialty Exam:  Presentation  General Appearance:  Disheveled  Eye Contact: Poor  Speech: Clear and Coherent  Speech Volume: Decreased  Handedness: Right  Mood and Affect  Mood: Depressed  Affect: Congruent  Thought Process  Thought Processes: Coherent  Descriptions of Associations:Intact  Orientation:Full (Time, Place and Person)  Thought Content:Logical  History of Schizophrenia/Schizoaffective disorder:Yes  Duration of Psychotic Symptoms:Greater than six months  Hallucinations:Hallucinations: Auditory  Ideas of Reference:Delusions; Paranoia  Suicidal Thoughts:Suicidal Thoughts: No  Homicidal Thoughts:Homicidal Thoughts: No  Sensorium  Memory: Immediate Good  Judgment: Poor  Insight: Poor  Executive Functions  Concentration: Fair  Attention Span: Fair  Recall: Fair  Fund of Knowledge: Fair  Language: Fair  Psychomotor Activity  Psychomotor Activity: Psychomotor Activity: Normal  Assets  Assets: Resilience  Sleep  Sleep: Sleep: Fair Number of Hours of Sleep: 5.25  Physical Exam: Physical Exam Vitals and nursing note reviewed.  Constitutional:      Appearance: He is normal weight.  HENT:     Head: Normocephalic.     Nose: Nose normal.     Mouth/Throat:     Pharynx: Oropharynx is clear.  Eyes:     Pupils: Pupils are equal, round, and reactive to light.  Cardiovascular:     Pulses: Normal pulses.  Pulmonary:     Effort: Pulmonary effort is normal.  Abdominal:     Palpations: Abdomen is soft.  Musculoskeletal:        General: Normal range of motion.  Skin:    General: Skin is dry.  Neurological:     Mental Status: He is oriented to person, place, and time.  Psychiatric:        Attention and Perception: He perceives auditory  hallucinations.  Mood and Affect: Mood is depressed. Affect is flat.        Speech: He is noncommunicative.        Behavior: Behavior is withdrawn.        Thought Content: Thought content is delusional. Thought content does not include homicidal or suicidal ideation. Thought content does not include homicidal or suicidal plan.        Cognition and Memory: Cognition normal.    Review of Systems  Neurological: Negative.   Psychiatric/Behavioral:  Positive for depression, hallucinations and substance abuse. Negative for memory loss and suicidal ideas. The patient is nervous/anxious and has insomnia.   All other systems reviewed and are negative.  Blood pressure 121/75, pulse 78, temperature 98.3 F (36.8 C), temperature source Oral, resp. rate 18, height 6\' 1"  (1.854 m), weight 66.2 kg, SpO2 99 %. Body mass index is 19.26 kg/m.  Treatment Plan Summary: Daily contact with patient to assess and evaluate symptoms and progress in treatment, Medication management, and Plan :   PLAN Safety and Monitoring: Voluntary admission to inpatient psychiatric unit for safety, stabilization and treatment Daily contact with patient to assess and evaluate symptoms and progress in treatment Patient's case to be discussed in multi-disciplinary team meeting Observation Level : q15 minute checks Vital signs: q12 hours Precautions: Safety   Long Term Goal(s): Improvement in symptoms so as ready for discharge   Short Term Goals: Ability to identify changes in lifestyle to reduce recurrence of condition will improve, Ability to disclose and discuss suicidal ideas, Ability to demonstrate self-control will improve, Ability to identify and develop effective coping behaviors will improve, Compliance with prescribed medications will improve, and Ability to identify triggers associated with substance abuse/mental health issues will improve.  Diagnoses:  Principal Problem:   Schizophrenia ([place]) Active  Problems:   Tardive dyskinesia   Anxiety state   Insomnia   Essential hypertension, benign   Schizophrenia -Discontinued Geodon at admission d/t lack of efficacy -Continue Invega 3 mg every morning, and increase night dose to 9 mg nightly for psychosis -Continue Ingrezza 40 mg daily for EPS   Anxiety -Continue Hydroxyzine 25 mg every 6 hours PRN   Insomnia -Continue melatonin 3 mg nightly  -Continue Trazodone 50 mg nightly PRN   Hypertension -Continue Norvasc 5 mg daily   Other PRNS -Continue Tylenol 650 mg every 6 hours PRN for mild pain -Continue Maalox 30 mg every 4 hrs PRN for indigestion -Continue Milk of Magnesia as needed every 6 hrs for constipation Other PRNS -Continue Tylenol 650 mg every 6 hours PRN for mild pain -Continue Maalox 30 mg every 4 hrs PRN for indigestion -Continue Milk of Magnesia as needed every 6 hrs for constipation   3. Discharge Planning: Social work and case management to assist with discharge planning and identification of hospital follow-up needs prior to discharge Estimated LOS: 5-7 days Discharge Concerns: Need to establish a safety plan; Medication compliance and effectiveness Discharge Goals: Return home with outpatient referrals for mental health follow-up including medication management/psychotherapy.  Nicholes Rough, NP 10/28/2021, 1:58 PMPatient ID: Wesley Peterson, male   DOB: [birth year], 36 y.o.   MRN: [id number]

## 2021-10-28 NOTE — Progress Notes (Signed)

## 2021-10-28 NOTE — Group Note (Signed)
[facility] LCSW Group Therapy Note    Group Date: 10/28/2021 Start Time: 1300 End Time: 1400  Type of Therapy and Topic:  Group Therapy:  Overcoming Obstacles  Participation Level:  [facility] PARTICIPATION LEVEL: Did Not Attend  Mood:   Description of Group:   In this group patients will be encouraged to explore what they see as obstacles to their own wellness and recovery. They will be guided to discuss their thoughts, feelings, and behaviors related to these obstacles. The group will process together ways to cope with barriers, with attention given to specific choices patients can make. Each patient will be challenged to identify changes they are motivated to make in order to overcome their obstacles. This group will be process-oriented, with patients participating in exploration of their own experiences as well as giving and receiving support and challenge from other group members.  Therapeutic Goals: 1. Patient will identify personal and current obstacles as they relate to admission. 2. Patient will identify barriers that currently interfere with their wellness or overcoming obstacles.  3. Patient will identify feelings, thought process and behaviors related to these barriers. 4. Patient will identify two changes they are willing to make to overcome these obstacles:    Summary of Patient Progress Patient did not attend group despite encouraged participation.    Therapeutic Modalities:   Cognitive Behavioral Therapy Solution Focused Therapy Motivational Interviewing Relapse Prevention Therapy   Baylie Drakes W Gianni Mihalik, LCSWA 

## 2021-10-28 NOTE — Progress Notes (Signed)
   10/28/21 2210  Psych Admission Type (Psych Patients Only)  Admission Status Involuntary  Psychosocial Assessment  Patient Complaints None  Eye Contact Brief  Facial Expression Flat  Affect Appropriate to circumstance  Speech Soft  Interaction Guarded;Isolative;Minimal  Motor Activity Other (Comment) (WDL)  Appearance/Hygiene Disheveled  Behavior Characteristics Appropriate to situation  Mood Suspicious;Preoccupied  Thought Process  Coherency Blocking  Content Ambivalence  Delusions None reported or observed  Perception Hallucinations  Hallucination Auditory  Judgment Poor  Confusion None  Danger to Self  Current suicidal ideation? Denies  Agreement Not to Harm Self Yes  Description of Agreement verbal  Danger to Others  Danger to Others None reported or observed  Danger to Others Abnormal  Harmful Behavior to others No threats or harm toward other people  Destructive Behavior No threats or harm toward property

## 2021-10-28 NOTE — Progress Notes (Signed)
Pt in bed most of the day. Isolative.Not attending groups or meals. Flat affect. Speech is soft and slow. Pt endorses auditory hallucinations. Denies visual hallucinations as well as suicidal/homicidal thoughts.

## 2021-10-29 DIAGNOSIS — F200 Paranoid schizophrenia: Secondary | ICD-10-CM | POA: Diagnosis not present

## 2021-10-29 NOTE — Progress Notes (Signed)
Patient ID: Yassir Enis, male   DOB: 1985-03-02, 36 y.o.   MRN: [id number] Pt presents with depressed mood, disorganized thoughts, flat affect. Niko forwards very little, is very guarded, suspicious and withdrawn. When asking questions he really does not contribute much, shakes his head and will not elaborate to open ended questions, and says ''no'' to most questions asked but his answers do not apepar Pt does report he is having continued auditory hallucinations, but will not elaborate, and states '' I can't tell you what they are saying, mumbling. ''  He reports poor sleep last night and did not want to get up out of bed for am med pass and did not attend breakfast.  Ensure given to ensure caloric intake for geodon this am. Pt denies any SI or HI . Pt did not complete self inventory and thus far has remained isolative, although he is able to make his needs known. Pt is safe, will con't to monitor. Above discussed in treatment team

## 2021-10-29 NOTE — Progress Notes (Signed)
Adult Psychoeducational Group Note  Date:  10/29/2021 Time:  9:15 PM  Group Topic/Focus:  Wrap-Up Group:   The focus of this group is to help patients review their daily goal of treatment and discuss progress on daily workbooks.  Participation Level:  Active  Participation Quality:  Appropriate  Affect:  Appropriate  Cognitive:  Appropriate  Insight: Appropriate  Engagement in Group:  Engaged  Modes of Intervention:  Education and Exploration  Additional Comments:  Patient attended and participated in group tonight. He reported that the most significant thing that happened to him was stht he went outside. The thing he like about himself was that he is creative.  Salley Scarlet [facility] 10/29/2021, 9:15 PM

## 2021-10-29 NOTE — Progress Notes (Signed)
[facility]  10/29/2021 1:39 PM Wesley EmoryMatt Ditmars  MRN:  [id number] Subjective:    History of Present Illness: Wesley Peterson is a 36 yo [place] male with a prior mental health history of schizophrenia who [facility] with worsening psychosis and confusion in the context of medication noncompliance and methamphetamine abuse.  Patient was involuntarily committed and transferred to this [facility] for treatment and stabilization of his mental status.   Past 24 hours: V/S for past 24 hrs mostly WNL with the exception of a slightly elevated HR of 108. Pt remains compliant with scheduled medications, did not require any agitation protocol medications or anti anxiety meds in the past 24 hrs. Only PRN medication given last night was Melatonin for sleep. Pt remained isolative to his room earlier today morning.  Today's patient assessment note: Today, pt speaks to Clinical research associatewriter with eyes closed, uses 1 words and short phrases in response to assessment questions being asked.  He requires multiple positive reinforcements to answer questions as well and sometimes requires repetitions of questions, and answers at times with nods of his head. Patient can continues to report auditory hallucinations of voices, states that he is unable to make out what the voices are saying but that he hears multiple voices speaking at the same time.  He reports that the voices have not responded to medications since admission and are unchanged since admission.  He also reports that the voices have not decreased in volume.    Pt continues to deny SI, denies VH, denies HI. He reports feelings of stiffness in his right upper extremity.  On assessment, patient has mild stiffness to his right arm.  Pt reports a good sleep quality last night and a good appetite currently. Patient is tolerating taking the Invega 9 mg at night and Invega 3 mg in the mornings.  We are adding Benadryl 50 mg nightly  to help with stiffness and right arm.  We are continuing other medications as listed below.   Writer again, educated pt on the fact than it will be necessary to transition to an LAI medication for management of his psychosis due to his history of medication non compliance, but he is currently resistant to the idea. We will continue  to revisit this every day as his psychosis clears. Pt complaining of feelings of stiffness to his right arm, assessed to have mild stiffness in this extremity. AIMS: 0 d/t no abnormal movements of any of his extremities.   Labs reviewed: HA1C is 5.7 rendering pt a prediabetic, educated on healthy food choices, but not in the mental state at this time to comprehend education. Will need education reinforcement as well as PCP f/u after discharge.  Principal Problem: Schizoaffective disorder, depressive type (HCC) Diagnosis: Principal Problem:   Schizoaffective disorder, depressive type (HCC) Active Problems:   Schizophrenia, paranoid (HCC)   Schizophrenia (HCC)   Tardive dyskinesia   Anxiety state   Insomnia   Essential hypertension, benign  Total Time spent with patient: 30 minutes  Past Psychiatric History: schizoaffective disorder bipolar type, tardive dyskinesia, insomnia, suicidal ideation, psychosis, auditory hallucinations, anxiety; multiple inpatient admissions  Past Medical History:  Past Medical History:  Diagnosis Date   Schizophrenia (HCC)    Schizophrenia, paranoid type (HCC) 02/02/2021   History reviewed. No pertinent surgical history. Family History: History reviewed. No pertinent family history. Family Psychiatric  History: not noted Social History:  Social History   Substance and Sexual Activity  Alcohol Use Not Currently   Alcohol/week: 1.0 standard drink of alcohol   Types: 1 Cans of beer per week   Comment: occasional     Social History   Substance and Sexual Activity  Drug Use Never    Social History   Socioeconomic History    Marital status: Single    Spouse name: Not on file   Number of children: 1   Years of education: Not on file   Highest education level: Not on file  Occupational History   Occupation: Unemployed  Tobacco Use   Smoking status: Never    Passive exposure: Never   Smokeless tobacco: Current   Tobacco comments:    Vaping nicotine   Vaping Use   Vaping Use: Not on file  Substance and Sexual Activity   Alcohol use: Not Currently    Alcohol/week: 1.0 standard drink of alcohol    Types: 1 Cans of beer per week    Comment: occasional   Drug use: Never   Sexual activity: Never  Other Topics Concern   Not on file  Social History Narrative   ** Merged History Encounter **       Pt lives with mother and other relatives.  Currently unemployed.  Receives outpatient psychiatry services through [facility].   Social Determinants of Health   Financial Resource Strain: Not on file  Food Insecurity: Food Insecurity Present (10/25/2021)   Hunger Vital Sign    Worried About Running Out of Food in the Last Year: Often true    Ran Out of Food in the Last Year: Often true  Transportation Needs: Unmet Transportation Needs (10/25/2021)   PRAPARE - Administrator, Civil Service (Medical): Yes    Lack of Transportation (Non-Medical): Yes  Physical Activity: Not on file  Stress: Not on file  Social Connections: Not on file   Additional Social History:    Sleep: Fair  Appetite:  Fair  Current Medications: Current Facility-Administered Medications  Medication Dose Route Frequency Provider Last Rate Last Admin   acetaminophen (TYLENOL) tablet 650 mg  650 mg Oral Q6H PRN Eligha Bridegroom, NP       amLODipine (NORVASC) tablet 5 mg  5 mg Oral Daily Eligha Bridegroom, NP   5 mg at 10/29/21 0729   feeding supplement (ENSURE ENLIVE / ENSURE PLUS) liquid 237 mL  237 mL Oral BID BM Massengill, Nathan, MD   237 mL at 10/29/21 1059   risperiDONE (RISPERDAL M-TABS) disintegrating tablet 2 mg  2 mg  Oral Q8H PRN Massengill, Harrold Donath, MD   2 mg at 10/26/21 1633   And   LORazepam (ATIVAN) tablet 1 mg  1 mg Oral Q6H PRN Massengill, Harrold Donath, MD       And   ziprasidone (GEODON) injection 20 mg  20 mg Intramuscular Q6H PRN Massengill, Nathan, MD       melatonin tablet 3 mg  3 mg Oral QHS PRN Eligha Bridegroom, NP   3 mg at 10/28/21 2108   multivitamin with minerals tablet 1 tablet  1 tablet Oral Daily Massengill, Nathan, MD   1 tablet at 10/29/21 0729   paliperidone (INVEGA) 24 hr tablet 3 mg  3 mg Oral Daily Polina Burmaster, NP   3 mg at 10/29/21 0729   paliperidone (INVEGA) 24 hr tablet 9 mg  9 mg Oral QHS Donoven Pett, NP   9 mg at 10/28/21 2108   pantoprazole (PROTONIX) EC tablet 40 mg  40 mg Oral Daily Eligha Bridegroom, NP  40 mg at 10/29/21 0729   traZODone (DESYREL) tablet 50 mg  50 mg Oral QHS Starleen Blue, NP   50 mg at 10/28/21 2108   valbenazine (INGREZZA) capsule 40 mg  40 mg Oral Daily Eligha Bridegroom, NP   40 mg at 10/29/21 3220   Lab Results:  No results found for this or any previous visit (from the past 48 hour(s)).  Blood Alcohol level:  Lab Results  Component Value Date   ETH <10 10/25/2021   ETH <10 08/31/2021   Metabolic Disorder Labs: Lab Results  Component Value Date   HGBA1C 5.7 (H) 10/27/2021   MPG 116.89 10/27/2021   MPG 122.63 06/02/2021   Lab Results  Component Value Date   PROLACTIN 19.9 (H) 09/06/2021   PROLACTIN 17.5 (H) 06/02/2021   Lab Results  Component Value Date   CHOL 137 06/02/2021   TRIG 105 06/02/2021   HDL 36 (L) 06/02/2021   CHOLHDL 3.8 06/02/2021   VLDL 21 06/02/2021   LDLCALC 80 06/02/2021   LDLCALC 80 01/30/2021   Physical Findings: AIMS: Facial and Oral Movements Muscles of Facial Expression: None, normal Lips and Perioral Area: None, normal Jaw: None, normal Tongue: None, normal0Extremity Movements Upper (arms, wrists, hands, fingers): None, normal Lower (legs, knees, ankles, toes): None, normal, Trunk Movements Neck,  shoulders, hips: None, normal, Overall Severity Severity of abnormal movements (highest score from questions above): None, normal Incapacitation due to abnormal movements: None, normal Patient's awareness of abnormal movements (rate only patient's report): No Awareness, Dental Status Current problems with teeth and/or dentures?: No Does patient usually wear dentures?: No  CIWA:n/a    COWS: n/a   Musculoskeletal: Strength & Muscle Tone: within normal limits Gait & Station: normal Patient leans: N/A  Psychiatric Specialty Exam:  Presentation  General Appearance:  Disheveled  Eye Contact: Minimal  Speech: Clear and Coherent  Speech Volume: Normal  Handedness: Right  Mood and Affect  Mood: Depressed  Affect: Congruent  Thought Process  Thought Processes: Coherent  Descriptions of Associations:Intact  Orientation:Partial  Thought Content:Logical  History of Schizophrenia/Schizoaffective disorder:Yes  Duration of Psychotic Symptoms:Greater than six months  Hallucinations:Hallucinations: Auditory  Ideas of Reference:None  Suicidal Thoughts:Suicidal Thoughts: No  Homicidal Thoughts:Homicidal Thoughts: No  Sensorium  Memory: Immediate Good  Judgment: Poor  Insight: Poor  Executive Functions  Concentration: Fair  Attention Span: Fair  Recall: Fair  Fund of Knowledge: Poor  Language: Fair  Psychomotor Activity  Psychomotor Activity: Psychomotor Activity: Other (comment) (stiffness to right arm (mild))  Assets  Assets: Resilience  Sleep  Sleep: Sleep: Good  Physical Exam: Physical Exam Vitals and nursing note reviewed.  Constitutional:      Appearance: He is normal weight.  HENT:     Head: Normocephalic.     Nose: Nose normal.     Mouth/Throat:     Pharynx: Oropharynx is clear.  Eyes:     Pupils: Pupils are equal, round, and reactive to light.  Cardiovascular:     Pulses: Normal pulses.  Pulmonary:     Effort:  Pulmonary effort is normal.  Abdominal:     Palpations: Abdomen is soft.  Musculoskeletal:        General: Normal range of motion.  Skin:    General: Skin is dry.  Neurological:     Mental Status: He is oriented to person, place, and time.  Psychiatric:        Attention and Perception: He perceives auditory hallucinations.  Mood and Affect: Mood is depressed. Affect is flat.        Speech: He is noncommunicative.        Behavior: Behavior is withdrawn.        Thought Content: Thought content is delusional. Thought content does not include homicidal or suicidal ideation. Thought content does not include homicidal or suicidal plan.        Cognition and Memory: Cognition normal.    Review of Systems  Constitutional:  Negative for fever.  HENT:  Negative for hearing loss and sore throat.   Respiratory:  Negative for cough.   Cardiovascular:  Negative for chest pain.  Gastrointestinal:  Negative for heartburn and nausea.  Genitourinary:  Negative for dysuria.  Musculoskeletal:  Positive for myalgias (right arm pain).  Skin:  Negative for rash.  Neurological: Negative.  Negative for tremors, weakness and headaches.  Psychiatric/Behavioral:  Positive for depression, hallucinations and substance abuse. Negative for memory loss and suicidal ideas. The patient is nervous/anxious and has insomnia.   All other systems reviewed and are negative.  Blood pressure 118/73, pulse (!) 108, temperature 98.2 F (36.8 C), temperature source Oral, resp. rate 18, height 6\' 1"  (1.854 m), weight 66.2 kg, SpO2 97 %. Body mass index is 19.26 kg/m.  Treatment Plan Summary: Daily contact with patient to assess and evaluate symptoms and progress in treatment, Medication management, and Plan :   PLAN Safety and Monitoring: Voluntary admission to inpatient psychiatric unit for safety, stabilization and treatment Daily contact with patient to assess and evaluate symptoms and progress in  treatment Patient's case to be discussed in multi-disciplinary team meeting Observation Level : q15 minute checks Vital signs: q12 hours Precautions: Safety   Long Term Goal(s): Improvement in symptoms so as ready for discharge   Short Term Goals: Ability to identify changes in lifestyle to reduce recurrence of condition will improve, Ability to disclose and discuss suicidal ideas, Ability to demonstrate self-control will improve, Ability to identify and develop effective coping behaviors will improve, Compliance with prescribed medications will improve, and Ability to identify triggers associated with substance abuse/mental health issues will improve.  Diagnoses:  Principal Problem:   Schizophrenia ([place]) Active Problems:   Tardive dyskinesia   Anxiety state   Insomnia   Essential hypertension, benign   Schizophrenia -Discontinued Geodon at admission d/t lack of efficacy -Continue Invega 3 mg every morning, and increase night dose to 9 mg nightly for psychosis -Continue Ingrezza 40 mg daily for EPS   Anxiety -Continue Hydroxyzine 25 mg every 6 hours PRN   Insomnia -Continue melatonin 3 mg nightly  -Continue Trazodone 50 mg nightly PRN   Hypertension -Continue Norvasc 5 mg daily   Other PRNS -Continue Tylenol 650 mg every 6 hours PRN for mild pain -Continue Maalox 30 mg every 4 hrs PRN for indigestion -Continue Milk of Magnesia as needed every 6 hrs for constipation Other PRNS -Continue Tylenol 650 mg every 6 hours PRN for mild pain -Continue Maalox 30 mg every 4 hrs PRN for indigestion -Continue Milk of Magnesia as needed every 6 hrs for constipation   3. Discharge Planning: Social work and case management to assist with discharge planning and identification of hospital follow-up needs prior to discharge Estimated LOS: 5-7 days Discharge Concerns: Need to establish a safety plan; Medication compliance and effectiveness Discharge Goals: Return home with outpatient  referrals for mental health follow-up including medication management/psychotherapy.  Nicholes Rough, NP 10/29/2021, 1:39 PMPatient ID: [name], male   DOB:  Jan 15, 1985, 36 y.o.   MRN: [id number] Patient ID: Benino Korinek, male   DOB: Jun 20, 1985, 36 y.o.   MRN: [id number]

## 2021-10-29 NOTE — Progress Notes (Signed)
   10/29/21 2229  Psych Admission Type (Psych Patients Only)  Admission Status Involuntary  Psychosocial Assessment  Patient Complaints None  Eye Contact Brief  Facial Expression Flat  Affect Appropriate to circumstance  Speech Soft  Interaction Guarded  Motor Activity Other (Comment) (WDL)  Appearance/Hygiene Improved  Behavior Characteristics Guarded  Mood Pleasant  Thought Process  Coherency WDL  Content Ambivalence  Delusions None reported or observed  Perception Hallucinations  Hallucination Auditory  Judgment Impaired  Confusion None  Danger to Self  Current suicidal ideation? Denies  Agreement Not to Harm Self Yes  Description of Agreement verbal  Danger to Others  Danger to Others None reported or observed  Danger to Others Abnormal  Harmful Behavior to others No threats or harm toward other people  Destructive Behavior No threats or harm toward property

## 2021-10-29 NOTE — Group Note (Signed)
[facility] LCSW Group Therapy Note  10/29/2021  10:00-11:00AM  Type of Therapy and Topic:  Group Therapy:  Grief   Participation Level:  Did Not Attend   Description of Group:  Patients in this group asked to talk about grief during this session.  As the topic was introduced, we talked about grief involving death much of the time, but also that it is the emotion we may experience at the loss of a relationship, job, pet, etc.  Patients were introduced to the well-known Kubler-Ross concept of 5 stages of grief (Denial, Anger, Bargaining, Depression, Acceptance) and the fact that these stages are often not linear and people may go through one or more stages numerous times, although with greater ease each subsequent time.  This led to a discussion about patients' sources of grief and what their experiences have been in going through this process.  Therapeutic Goals: 1)  learn about the 5 stages of grief 2)  share patients' direct experiences in going through these stages  3)  give an opportunity to each patient to say out loud what they would like to say to a person they are currently grieving  4) discuss actions that could possibly help in the grief process such as writing a letter, journaling, listening to music, exercising, saying names out loud, and more   Summary of Patient Progress:  Patient was invited to group, did not attend.   Therapeutic Modalities:   Motivational Interviewing Processing  Wesley Peterson      

## 2021-10-29 NOTE — Plan of Care (Signed)
  Problem: Education: Goal: Knowledge of [facility] Education information/materials will improve Outcome: Not Progressing Goal: Emotional status will improve Outcome: Not Progressing Goal: Mental status will improve Outcome: Not Progressing Goal: Verbalization of understanding the information provided will improve Outcome: Not Progressing   Problem: Activity: Goal: Interest or engagement in activities will improve Outcome: Not Progressing Goal: Sleeping patterns will improve Outcome: Not Progressing   Problem: Coping: Goal: Ability to verbalize frustrations and anger appropriately will improve Outcome: Not Progressing Goal: Ability to demonstrate self-control will improve Outcome: Not Progressing

## 2021-10-29 NOTE — [facility] Group Notes (Signed)
Adult Goals Group Note  Date:  10/29/2021 Time:  11:55 AM  Group Topic/Focus:  Goals Group:   The focus of this group is to help patients establish daily goals to achieve during treatment and discuss how the patient can incorporate goal setting into their daily lives to aide in recovery.  Participation Level:  Did Not Attend 

## 2021-10-29 NOTE — [facility] Group Notes (Signed)
Psychoeducational Group- Patients were given activity in which they were asked to reflect on negative coping skills and barriers they had to developing positive ones. Patients were educated on the impact of vulnerability and how that can impact mental health.  Pt did not attend 

## 2021-10-30 DIAGNOSIS — F200 Paranoid schizophrenia: Secondary | ICD-10-CM | POA: Diagnosis not present

## 2021-10-30 MED ORDER — FLUPHENAZINE HCL 5 MG PO TABS
5.0000 mg | ORAL_TABLET | Freq: Two times a day (BID) | ORAL | Status: DC
  Administered 2021-10-30 – 2021-11-01 (×4): 5 mg via ORAL
  Filled 2021-10-30 (×8): qty 1

## 2021-10-30 MED ORDER — DIPHENHYDRAMINE HCL 25 MG PO CAPS
25.0000 mg | ORAL_CAPSULE | Freq: Every day | ORAL | Status: DC
  Administered 2021-10-30 – 2021-10-31 (×2): 25 mg via ORAL
  Filled 2021-10-30 (×4): qty 1

## 2021-10-30 NOTE — Progress Notes (Signed)
   10/30/21 2207  Psych Admission Type (Psych Patients Only)  Admission Status Involuntary  Psychosocial Assessment  Patient Complaints Anxiety  Eye Contact Brief  Facial Expression Flat  Affect Appropriate to circumstance  Speech Logical/coherent  Interaction Guarded  Motor Activity Other (Comment) (WDL)  Appearance/Hygiene Unremarkable  Behavior Characteristics Appropriate to situation  Mood Pleasant  Thought Process  Coherency WDL  Content Ambivalence  Delusions None reported or observed  Perception Hallucinations  Hallucination Auditory  Judgment Impaired  Confusion None  Danger to Self  Current suicidal ideation? Denies  Agreement Not to Harm Self Yes  Description of Agreement verbal  Danger to Others  Danger to Others None reported or observed  Danger to Others Abnormal  Harmful Behavior to others No threats or harm toward other people  Destructive Behavior No threats or harm toward property

## 2021-10-30 NOTE — Progress Notes (Signed)
Adult Psychoeducational Group Note  Date:  10/30/2021 Time:  9:21 PM  Group Topic/Focus:  Wrap-Up Group:   The focus of this group is to help patients review their daily goal of treatment and discuss progress on daily workbooks.  Participation Level:  Active  Participation Quality:  Appropriate and Sharing  Affect:  Appropriate  Cognitive:  Appropriate  Insight: Appropriate  Engagement in Group:  Engaged  Modes of Intervention:  Discussion and Support  Additional Comments:   Pt attended and actively engaged in the [place] group. Pt denies SI/HI/AVH. Pt rated his day 7/10. Pt reports some progress toward medication mgmt. Pt plans to continue taking medication as prescribed and reading daily to improve coping.  Wesley Peterson [name] 10/30/2021, 9:21 PM

## 2021-10-30 NOTE — Progress Notes (Addendum)
D:  Patient denied SI and HI, contracts for safety.  Denied visual hallucinations.  Does hear voices off/on.  Denied pain. A:  Medications administered per MD orders.  Emotional support and encouragement given patient. R:  Safety checks maintained every 15 minutes.

## 2021-10-30 NOTE — Plan of Care (Signed)
Nurse discussed anxiety, depression and coping skills with patient.  

## 2021-10-30 NOTE — [facility] Group Notes (Addendum)
Psychoeducational group- Patients were given two poems to read:  Wesley Peterson titled '' The Henreitta Leber and the Douglas '' and '' There's a hole in my sidewalk '' by Cristopher Peru.  The first poem discussed fight or flight response in comparison to the owl and the chimp. The patients were asked to identify times in their lives in which they behaved like the owl and the chimp and how they could identify triggers, and then healthy coping mechanisms. The patients were then asked to discuss the second poem and how it related to negative behavioral patterns and identifying ways for healthier coping.  Pt did not attend.

## 2021-10-30 NOTE — Group Note (Signed)
LCSW Group Therapy Note  10/30/2021      Type of Therapy and Topic:  Group Therapy: Gratitude  Participation Level:  Did Not Attend   Description of Group:   In this group, patients shared and discussed the importance of acknowledging the elements in their lives for which they are grateful and how this can positively impact their mood.  The group discussed how bringing the positive elements of their lives to the forefront of their minds can help with recovery from any illness, physical or mental.  An exercise was done as a group in which a list was made of gratitude items in order to encourage participants to consider other potential positives in their lives.  Therapeutic Goals: Patients will identify one or more item for which they are grateful in each of 6 categories:  people, experiences, things, places, skills, and other. Patients will discuss how it is possible to seek out gratitude in even bad situations. Patients will explore other possible items of gratitude that they could remember.   Summary of Patient Progress:  Patient was invited to group, did not attend.   Therapeutic Modalities:   Solution-Focused Therapy Activity  Gabriele Loveland J Grossman-Orr, LCSW .  

## 2021-10-30 NOTE — Progress Notes (Signed)
[facility] MD Progress Note  10/30/2021 12:39 PM Joaovictor Krone  MRN:  [id number] Subjective:    History of Present Illness: Wesley Peterson is a 36 yo [place] American male with a prior mental health history of schizophrenia who presented to the [facility] with worsening psychosis and confusion in the context of medication noncompliance and methamphetamine abuse.  Patient was involuntarily committed and transferred to this [facility] for treatment and stabilization of his mental status.   Past 24 hours: BP and HR elevated earlier today morning at HR of 116 and BP of 133/110. It was repeated and is currently WNL at 120/85 and a slight elevation HR at 103. Pt is continuing to be compliant with his scheduled medications, did not require any anti anxiety meds, or medications for agitation over the past 24 hrs. Pt did not attend any of the unit group sessions yesterday, but went to the group session last night as per staff documentation. No behavioral concerns noted over the past 24 hrs.  Today's patient assessment note: Today, pt again spoke to writer with his eyes closed.  He provided responses as one wants, short phrases, or by nodding his head.  Patient continues to endorse auditory hallucinations of voices, states the voices are multiple but that he is unable to make out what they are saying.  Patient reports that the voices have remained the same since hospitalization.  He states that the voices of the same volume and intensity as prior to admission.  He denies visual hallucinations, denies homicidal ideations, and denies feelings of paranoia.  As per staff reports, patient has been observed many times talking to himself, seeming to be responding to some stimuli.  Patient reports an improvement in his mood since being hospitalized, mood is however depressed and affect is congruent.  Attention to personal hygiene and grooming is poor, and the need to tend to personal hygiene has been  reinforced.  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  He has failed Zyprexa and currently has not made any improvement on Invega. Today is the 5 day of Invega, and symptoms are same as during admission.  We will therefore discontinue Invega, and start Prolixin for management of psychosis.  Plan is to give patient a long-acting injectable medication for management of psychosis prior to discharge.  Patient is currently resistant to the idea of an LAI.  We will continue to revisit this topic with him on a daily basis.  We are starting Prolixin 4 mg twice daily starting tonight for management of psychosis.  We will discontinue Invega, and continue other medications as listed below.  We are adding Benadryl 25 mg nightly to help with stiffness to bilateral upper extremities.  Patient has a history of TD, and Cogentin will not be used for stiffness due to this.  Labs reviewed: HA1C is 5.7 rendering pt a prediabetic, educated on healthy food choices, but not in the mental state at this time to comprehend education. Will need education reinforcement as well as PCP f/u after discharge.  Principal Problem: Schizoaffective disorder, depressive type (HCC) Diagnosis: Principal Problem:   Schizoaffective disorder, depressive type (HCC) Active Problems:   Schizophrenia, paranoid (HCC)   Schizophrenia (HCC)   Tardive dyskinesia   Anxiety state   Insomnia   Essential hypertension, benign  Total Time spent with patient: 30 minutes  Past Psychiatric History: schizoaffective disorder bipolar type, tardive dyskinesia, insomnia, suicidal ideation, psychosis, auditory hallucinations, anxiety; multiple inpatient admissions  Past Medical  History:  Past Medical History:  Diagnosis Date   Schizophrenia (HCC)    Schizophrenia, paranoid type (HCC) 02/02/2021   History reviewed. No pertinent surgical history. Family History: History reviewed. No pertinent family history. Family Psychiatric   History: not noted Social History:  Social History   Substance and Sexual Activity  Alcohol Use Not Currently   Alcohol/week: 1.0 standard drink of alcohol   Types: 1 Cans of beer per week   Comment: occasional     Social History   Substance and Sexual Activity  Drug Use Never    Social History   Socioeconomic History   Marital status: Single    Spouse name: Not on file   Number of children: 1   Years of education: Not on file   Highest education level: Not on file  Occupational History   Occupation: Unemployed  Tobacco Use   Smoking status: Never    Passive exposure: Never   Smokeless tobacco: Current   Tobacco comments:    Vaping nicotine   Vaping Use   Vaping Use: Not on file  Substance and Sexual Activity   Alcohol use: Not Currently    Alcohol/week: 1.0 standard drink of alcohol    Types: 1 Cans of beer per week    Comment: occasional   Drug use: Never   Sexual activity: Never  Other Topics Concern   Not on file  Social History Narrative   ** Merged History Encounter **       Pt lives with mother and other relatives.  Currently unemployed.  [facility]   Social Determinants of Health   Financial Resource Strain: Not on file  Food Insecurity: Food Insecurity Present (10/25/2021)   Hunger Vital Sign    Worried About Running Out of Food in the Last Year: Often true    Ran Out of Food in the Last Year: Often true  Transportation Needs: Unmet Transportation Needs (10/25/2021)   PRAPARE - Administrator, Civil ServiceTransportation    Lack of Transportation (Medical): Yes    Lack of Transportation (Non-Medical): Yes  Physical Activity: Not on file  Stress: Not on file  Social Connections: Not on file   Additional Social History:    Sleep: Fair  Appetite:  Fair  Current Medications: Current Facility-Administered Medications  Medication Dose Route Frequency Provider Last Rate Last Admin   acetaminophen (TYLENOL) tablet 650 mg  650 mg  Oral Q6H PRN Eligha Bridegroomoleman, Mikaela, NP       amLODipine (NORVASC) tablet 5 mg  5 mg Oral Daily Eligha Bridegroomoleman, Mikaela, NP   5 mg at 10/30/21 0900   diphenhydrAMINE (BENADRYL) capsule 25 mg  25 mg Oral QHS Mardella Nuckles, NP       feeding supplement (ENSURE ENLIVE / ENSURE PLUS) liquid 237 mL  237 mL Oral BID BM Massengill, Nathan, MD   237 mL at 10/30/21 1000   fluPHENAZine (PROLIXIN) tablet 5 mg  5 mg Oral BID Starleen BlueNkwenti, Nuh Lipton, NP       risperiDONE (RISPERDAL M-TABS) disintegrating tablet 2 mg  2 mg Oral Q8H PRN Massengill, Nathan, MD   2 mg at 10/26/21 1633   And   LORazepam (ATIVAN) tablet 1 mg  1 mg Oral Q6H PRN Massengill, Harrold DonathNathan, MD       And   ziprasidone (GEODON) injection 20 mg  20 mg Intramuscular Q6H PRN Massengill, Nathan, MD       melatonin tablet 3 mg  3 mg Oral QHS PRN Eligha Bridegroomoleman, Mikaela, NP  3 mg at 10/28/21 2108   multivitamin with minerals tablet 1 tablet  1 tablet Oral Daily Massengill, Nathan, MD   1 tablet at 10/30/21 0900   pantoprazole (PROTONIX) EC tablet 40 mg  40 mg Oral Daily Eligha Bridegroom, NP   40 mg at 10/30/21 0900   traZODone (DESYREL) tablet 50 mg  50 mg Oral QHS Caeley Dohrmann, NP   50 mg at 10/29/21 2120   valbenazine (INGREZZA) capsule 40 mg  40 mg Oral Daily Eligha Bridegroom, NP   40 mg at 10/30/21 0900   Lab Results:  No results found for this or any previous visit (from the past 48 hour(s)).  Blood Alcohol level:  Lab Results  Component Value Date   ETH <10 10/25/2021   ETH <10 08/31/2021   Metabolic Disorder Labs: Lab Results  Component Value Date   HGBA1C 5.7 (H) 10/27/2021   MPG 116.89 10/27/2021   MPG 122.63 06/02/2021   Lab Results  Component Value Date   PROLACTIN 19.9 (H) 09/06/2021   PROLACTIN 17.5 (H) 06/02/2021   Lab Results  Component Value Date   CHOL 137 06/02/2021   TRIG 105 06/02/2021   HDL 36 (L) 06/02/2021   CHOLHDL 3.8 06/02/2021   VLDL 21 06/02/2021   LDLCALC 80 06/02/2021   LDLCALC 80 01/30/2021   Physical Findings: AIMS:  Facial and Oral Movements Muscles of Facial Expression: None, normal Lips and Perioral Area: None, normal Jaw: None, normal Tongue: None, normal0Extremity Movements Upper (arms, wrists, hands, fingers): None, normal Lower (legs, knees, ankles, toes): None, normal, Trunk Movements Neck, shoulders, hips: None, normal, Overall Severity Severity of abnormal movements (highest score from questions above): None, normal Incapacitation due to abnormal movements: None, normal Patient's awareness of abnormal movements (rate only patient's report): No Awareness, Dental Status Current problems with teeth and/or dentures?: No Does patient usually wear dentures?: No  CIWA:n/a    COWS: n/a   Musculoskeletal: Strength & Muscle Tone: within normal limits Gait & Station: normal Patient leans: N/A  Psychiatric Specialty Exam:  Presentation  General Appearance:  Appropriate for Environment; Disheveled  Eye Contact: Poor  Speech: Clear and Coherent  Speech Volume: Normal  Handedness: Right  Mood and Affect  Mood: Depressed  Affect: Congruent  Thought Process  Thought Processes: Coherent  Descriptions of Associations:Intact  Orientation:Partial  Thought Content:Illogical  History of Schizophrenia/Schizoaffective disorder:Yes  Duration of Psychotic Symptoms:Greater than six months  Hallucinations:Hallucinations: Auditory  Ideas of Reference:None  Suicidal Thoughts:Suicidal Thoughts: No  Homicidal Thoughts:Homicidal Thoughts: No  Sensorium  Memory: Immediate Good  Judgment: Poor  Insight: Poor  Executive Functions  Concentration: Fair  Attention Span: Fair  Recall: Poor  Fund of Knowledge: Poor  Language: Fair  Psychomotor Activity  Psychomotor Activity: Psychomotor Activity: Normal  Assets  Assets: Communication Skills  Sleep  Sleep: Sleep: Good  Physical Exam: Physical Exam Vitals and nursing note reviewed.  Constitutional:       Appearance: He is normal weight.  HENT:     Head: Normocephalic.     Nose: Nose normal.     Mouth/Throat:     Pharynx: Oropharynx is clear.  Eyes:     Pupils: Pupils are equal, round, and reactive to light.  Cardiovascular:     Pulses: Normal pulses.  Pulmonary:     Effort: Pulmonary effort is normal.  Abdominal:     Palpations: Abdomen is soft.  Musculoskeletal:        General: Normal range of motion.  Skin:  General: Skin is dry.  Neurological:     Mental Status: He is oriented to person, place, and time.  Psychiatric:        Attention and Perception: He perceives auditory hallucinations.        Mood and Affect: Mood is depressed. Affect is flat.        Speech: He is noncommunicative.        Behavior: Behavior is withdrawn.        Thought Content: Thought content is delusional. Thought content does not include homicidal or suicidal ideation. Thought content does not include homicidal or suicidal plan.        Cognition and Memory: Cognition normal.    Review of Systems  Constitutional:  Negative for fever.  HENT:  Negative for hearing loss and sore throat.   Respiratory:  Negative for cough.   Cardiovascular:  Negative for chest pain.  Gastrointestinal:  Negative for heartburn and nausea.  Genitourinary:  Negative for dysuria.  Musculoskeletal:  Positive for myalgias (right arm pain).  Skin:  Negative for rash.  Neurological: Negative.  Negative for tremors, weakness and headaches.  Psychiatric/Behavioral:  Positive for depression (denies SI, denies HI), hallucinations (+AH of voices) and substance abuse (meth abuse). Negative for memory loss and suicidal ideas. The patient is nervous/anxious (improving) and has insomnia (improving).   All other systems reviewed and are negative.  Blood pressure 120/85, pulse (!) 103, temperature 98.7 F (37.1 C), temperature source Oral, resp. rate 17, height 6\' 1"  (1.854 m), weight 66.2 kg, SpO2 97 %. Body mass index is 19.26  kg/m.  Treatment Plan Summary: Daily contact with patient to assess and evaluate symptoms and progress in treatment, Medication management, and Plan :   PLAN Safety and Monitoring: Voluntary admission to inpatient psychiatric unit for safety, stabilization and treatment Daily contact with patient to assess and evaluate symptoms and progress in treatment Patient's case to be discussed in multi-disciplinary team meeting Observation Level : q15 minute checks Vital signs: q12 hours Precautions: Safety   Long Term Goal(s): Improvement in symptoms so as ready for discharge   Short Term Goals: Ability to identify changes in lifestyle to reduce recurrence of condition will improve, Ability to disclose and discuss suicidal ideas, Ability to demonstrate self-control will improve, Ability to identify and develop effective coping behaviors will improve, Compliance with prescribed medications will improve, and Ability to identify triggers associated with substance abuse/mental health issues will improve.  Diagnoses:  Principal Problem:   Schizophrenia ([place]) Active Problems:   Tardive dyskinesia   Anxiety state   Insomnia   Essential hypertension, benign   Schizophrenia -Start Prolixin 5 mg tonight at 2100 (10/22 @ 2200) -Discontinued Geodon at admission d/t lack of efficacy -Discontinue Invega d/t lack of efficacy. Has also failed Zyprexa, in the past -Continue Ingrezza 40 mg daily for EPS -Start Benadryl 25 mg nightly for mild stiffness to b/l upper extremities   Anxiety -Continue Hydroxyzine 25 mg every 6 hours PRN   Insomnia -Continue melatonin 3 mg nightly  -Continue Trazodone 50 mg nightly PRN   Hypertension -Continue Norvasc 5 mg daily   Other PRNS -Continue Tylenol 650 mg every 6 hours PRN for mild pain -Continue Maalox 30 mg every 4 hrs PRN for indigestion -Continue Milk of Magnesia as needed every 6 hrs for constipation   3. Discharge Planning: Social work and case  management to assist with discharge planning and identification of hospital follow-up needs prior to discharge Estimated LOS: 5-7 days Discharge Concerns:  Need to establish a safety plan; Medication compliance and effectiveness Discharge Goals: Return home with outpatient referrals for mental health follow-up including medication management/psychotherapy.  Starleen Blue, NP 10/30/2021, 12:39 PMPatient ID: Felipa Emory, male   DOB: 02/01/1985, 36 y.o.   MRN: [id number] Patient ID: [employer], male   DOB: 06-23-1985, 36 y.o.

## 2021-10-31 ENCOUNTER — Encounter (HOSPITAL_COMMUNITY): Payer: Self-pay

## 2021-10-31 DIAGNOSIS — F200 Paranoid schizophrenia: Secondary | ICD-10-CM | POA: Diagnosis not present

## 2021-10-31 NOTE — BH IP Treatment Plan (Signed)
Interdisciplinary Treatment and Diagnostic Plan Update  10/31/2021 Time of Session: update Wesley Peterson MRN: [id number]  Principal Diagnosis: Schizoaffective disorder, depressive type (HCC)  Secondary Diagnoses: Principal Problem:   Schizoaffective disorder, depressive type (HCC) Active Problems:   Schizophrenia, paranoid (HCC)   Schizophrenia (HCC)   Tardive dyskinesia   Anxiety state   Insomnia   Essential hypertension, benign   Current Medications:  Current Facility-Administered Medications  Medication Dose Route Frequency Provider Last Rate Last Admin   acetaminophen (TYLENOL) tablet 650 mg  650 mg Oral Q6H PRN Eligha Bridegroom, NP       amLODipine (NORVASC) tablet 5 mg  5 mg Oral Daily Eligha Bridegroom, NP   5 mg at 10/31/21 1218   diphenhydrAMINE (BENADRYL) capsule 25 mg  25 mg Oral QHS Starleen Blue, NP   25 mg at 10/30/21 2114   feeding supplement (ENSURE ENLIVE / ENSURE PLUS) liquid 237 mL  237 mL Oral BID BM Massengill, Harrold Donath, MD   237 mL at 10/31/21 1446   fluPHENAZine (PROLIXIN) tablet 5 mg  5 mg Oral BID Starleen Blue, NP   5 mg at 10/31/21 1218   risperiDONE (RISPERDAL M-TABS) disintegrating tablet 2 mg  2 mg Oral Q8H PRN Phineas Inches, MD   2 mg at 10/26/21 1633   And   LORazepam (ATIVAN) tablet 1 mg  1 mg Oral Q6H PRN Massengill, Harrold Donath, MD       And   ziprasidone (GEODON) injection 20 mg  20 mg Intramuscular Q6H PRN Massengill, Harrold Donath, MD       melatonin tablet 3 mg  3 mg Oral QHS PRN Eligha Bridegroom, NP   3 mg at 10/28/21 2108   multivitamin with minerals tablet 1 tablet  1 tablet Oral Daily Massengill, Harrold Donath, MD   1 tablet at 10/31/21 1218   pantoprazole (PROTONIX) EC tablet 40 mg  40 mg Oral Daily Eligha Bridegroom, NP   40 mg at 10/31/21 1218   traZODone (DESYREL) tablet 50 mg  50 mg Oral QHS Starleen Blue, NP   50 mg at 10/30/21 2113   valbenazine (INGREZZA) capsule 40 mg  40 mg Oral Daily Eligha Bridegroom, NP   40 mg at 10/31/21 1218   PTA  Medications: Medications Prior to Admission  Medication Sig Dispense Refill Last Dose   ziprasidone (GEODON) 60 MG capsule Take 1 capsule (60 mg total) by mouth 2 (two) times daily with a meal. 60 capsule 0    amLODipine (NORVASC) 5 MG tablet Take 1 tablet (5 mg total) by mouth daily. 30 tablet 0    melatonin 3 MG TABS tablet Take 1 tablet (3 mg total) by mouth at bedtime as needed. 30 tablet 0    pantoprazole (PROTONIX) 40 MG tablet Take 1 tablet (40 mg total) by mouth daily. 30 tablet 0    valbenazine (INGREZZA) 40 MG capsule Take 1 capsule (40 mg total) by mouth daily. 30 capsule 0     Patient Stressors: Financial difficulties   Medication change or noncompliance   Substance abuse    Patient Strengths: Ability for insight  Active sense of humor  Capable of independent living  Arboriculturist fund of knowledge  Motivation for treatment/growth   Treatment Modalities: Medication Management, Group therapy, Case management,  1 to 1 session with clinician, Psychoeducation, Recreational therapy.   Physician Treatment Plan for Primary Diagnosis: Schizoaffective disorder, depressive type (HCC) Long Term Goal(s): Improvement in symptoms so as ready for discharge   Short  Term Goals: Ability to verbalize feelings will improve Ability to disclose and discuss suicidal ideas Ability to identify and develop effective coping behaviors will improve Compliance with prescribed medications will improve Ability to identify triggers associated with substance abuse/mental health issues will improve  Medication Management: Evaluate patient's response, side effects, and tolerance of medication regimen.  Therapeutic Interventions: 1 to 1 sessions, Unit Group sessions and Medication administration.  Evaluation of Outcomes: Not Progressing  Physician Treatment Plan for Secondary Diagnosis: Principal Problem:   Schizoaffective disorder, depressive type (HCC) Active  Problems:   Schizophrenia, paranoid (HCC)   Schizophrenia (HCC)   Tardive dyskinesia   Anxiety state   Insomnia   Essential hypertension, benign  Long Term Goal(s): Improvement in symptoms so as ready for discharge   Short Term Goals: Ability to verbalize feelings will improve Ability to disclose and discuss suicidal ideas Ability to identify and develop effective coping behaviors will improve Compliance with prescribed medications will improve Ability to identify triggers associated with substance abuse/mental health issues will improve     Medication Management: Evaluate patient's response, side effects, and tolerance of medication regimen.  Therapeutic Interventions: 1 to 1 sessions, Unit Group sessions and Medication administration.  Evaluation of Outcomes: Not Progressing   RN Treatment Plan for Primary Diagnosis: Schizoaffective disorder, depressive type (HCC) Long Term Goal(s): Knowledge of disease and therapeutic regimen to maintain health will improve  Short Term Goals: Ability to remain free from injury will improve, Ability to verbalize frustration and anger appropriately will improve, Ability to demonstrate self-control, Ability to participate in decision making will improve, Ability to verbalize feelings will improve, Ability to disclose and discuss suicidal ideas, Ability to identify and develop effective coping behaviors will improve, and Compliance with prescribed medications will improve  Medication Management: RN will administer medications as ordered by provider, will assess and evaluate patient's response and provide education to patient for prescribed medication. RN will report any adverse and/or side effects to prescribing provider.  Therapeutic Interventions: 1 on 1 counseling sessions, Psychoeducation, Medication administration, Evaluate responses to treatment, Monitor vital signs and CBGs as ordered, Perform/monitor CIWA, COWS, AIMS and Fall Risk screenings as  ordered, Perform wound care treatments as ordered.  Evaluation of Outcomes: Not Progressing   LCSW Treatment Plan for Primary Diagnosis: Schizoaffective disorder, depressive type (HCC) Long Term Goal(s): Safe transition to appropriate next level of care at discharge, Engage patient in therapeutic group addressing interpersonal concerns.  Short Term Goals: Engage patient in aftercare planning with referrals and resources, Increase social support, Increase ability to appropriately verbalize feelings, Increase emotional regulation, Facilitate acceptance of mental health diagnosis and concerns, Facilitate patient progression through stages of change regarding substance use diagnoses and concerns, Identify triggers associated with mental health/substance abuse issues, and Increase skills for wellness and recovery  Therapeutic Interventions: Assess for all discharge needs, 1 to 1 time with Social worker, Explore available resources and support systems, Assess for adequacy in community support network, Educate family and significant other(s) on suicide prevention, Complete Psychosocial Assessment, Interpersonal group therapy.  Evaluation of Outcomes: Not Progressing   Progress in Treatment: Attending groups: No. Participating in groups: No. Taking medication as prescribed: Yes. Toleration medication: Yes. Family/Significant other contact made: No, will contact:  patient will not provide consent.  Patient not talking with LCSW and only nods head at times Patient understands diagnosis: No. Discussing patient identified problems/goals with staff: No. Medical problems stabilized or resolved: Yes. Denies suicidal/homicidal ideation: Yes. Issues/concerns per patient self-inventory: No.  New problem(s)  identified: No, Describe:  none reported   New Short Term/Long Term Goal(s): detox, medication management for mood stabilization; elimination of SI thoughts; development of comprehensive mental  wellness/sobriety plan    Patient Goals:  Pt continues to not participate in treatment team and does not have a goal that he has identified at this time.   Discharge Plan or Barriers: Patient currently homeless and has pattern of not following up with outpatient mental health support.  Patient currently not participating in his own treatment and discharge plan.  Treatment team is recommending connection with [facility] in either [facility] or [facility] and choices of ACTT have been provided.  Patient has not yet consented to follow up.    Reason for Continuation of Hospitalization: Delusions  Depression Hallucinations Medication stabilization Withdrawal symptoms  Estimated Length of Stay: 5-10 days  Last 3 [place] Suicide Severity Risk Score: [place] Admission (Current) from 10/25/2021 in [place] [facility] Most recent reading at 10/25/2021  2:00 PM ED from 10/25/2021 in [place] Most recent reading at 10/25/2021  5:30 AM Admission (Discharged) from 09/03/2021 in [place] [facility] Most recent reading at 09/05/2021  8:00 AM  C-SSRS RISK CATEGORY No Risk No Risk No Risk       Last PHQ 2/9 Scores:     No data to display          Scribe for Treatment Team: Zachery Conch, LCSW 10/31/2021 3:08 PM

## 2021-10-31 NOTE — Progress Notes (Signed)
[facility] MD Progress Note  10/31/2021 2:47 PM Wesley Peterson  MRN:  [id number] Subjective:    History of Present Illness: Wesley Peterson is a 36 yo [place] American male with a prior mental health history of schizophrenia who presented to the [facility] with worsening psychosis and confusion in the context of medication noncompliance and methamphetamine abuse.  Patient was involuntarily committed and transferred to this [facility] for treatment and stabilization of his mental status.   Past 24 hours: BP normalized towards yesterday afternoon. Pt's assigned RN notified of a need for current vital signs. Pt is continuing to be compliant with his scheduled medications, did not require any anti anxiety meds, or medications for agitation over the past 24 hrs. Pt  has continued to be isolative to his room as per staff reports that does not attended unit group activities in the last 24 hrs.  Today's patient assessment note: There are no changes in today's assessment as compared to yesterday. Pt spoke to Clinical research associate with eyes closed, responded to questions with short phrases or single words. He denies SI, denies HI, denies VH, denies paranoia. He reports that he is continuing to have auditory hallucinations, but does not provide details of what he is hearing. Pt asked what he is hearing and stated "I don't know". He is asked the last time that he heard voices and stated "I don't know". He is asked if he is hearing voices now when talking to Clinical research associate and states "no". When asked questions related to thought insertion, he affirms to thought insertion and thought withdrawal, but responds "I don't know" when asked who he thinks is putting thoughts in his head or taking them out. It is uncertain how much of this is him being a poor historian or if he is not reported that his psychosis is resolving so that he can continue to have secondary gains of shelter and food.   Pt asked how his sleep quality last  night was and states "I don't know". Writer again asked if sleep was poor, fair or good, and he stated "I don't know". He reports a good appetite, denies any medication related side effects, and is tolerating the Prolixin well. No TD/EPS type symptoms found on assessment, and pt denies any feelings of stiffness. AIMS: 0. Will continue Prolixin 5 mg BID for psychosis.  Labs reviewed: HA1C is 5.7 rendering pt a prediabetic, educated on healthy food choices, but not in the mental state at this time to comprehend education. Will need education reinforcement as well as PCP f/u after discharge.  Principal Problem: Schizoaffective disorder, depressive type (HCC) Diagnosis: Principal Problem:   Schizoaffective disorder, depressive type (HCC) Active Problems:   Schizophrenia, paranoid (HCC)   Schizophrenia (HCC)   Tardive dyskinesia   Anxiety state   Insomnia   Essential hypertension, benign  Total Time spent with patient: 30 minutes  Past Psychiatric History: schizoaffective disorder bipolar type, tardive dyskinesia, insomnia, suicidal ideation, psychosis, auditory hallucinations, anxiety; multiple inpatient admissions  Past Medical History:  Past Medical History:  Diagnosis Date   Schizophrenia (HCC)    Schizophrenia, paranoid type (HCC) 02/02/2021   History reviewed. No pertinent surgical history. Family History: History reviewed. No pertinent family history. Family Psychiatric  History: not noted Social History:  Social History   Substance and Sexual Activity  Alcohol Use Not Currently   Alcohol/week: 1.0 standard drink of alcohol   Types: 1 Cans of beer per week   Comment: occasional  Social History   Substance and Sexual Activity  Drug Use Never    Social History   Socioeconomic History   Marital status: Single    Spouse name: Not on file   Number of children: 1   Years of education: Not on file   Highest education level: Not on file  Occupational History    Occupation: Unemployed  Tobacco Use   Smoking status: Never    Passive exposure: Never   Smokeless tobacco: Current   Tobacco comments:    Vaping nicotine   Vaping Use   Vaping Use: Not on file  Substance and Sexual Activity   Alcohol use: Not Currently    Alcohol/week: 1.0 standard drink of alcohol    Types: 1 Cans of beer per week    Comment: occasional   Drug use: Never   Sexual activity: Never  Other Topics Concern   Not on file  Social History Narrative   ** Merged History Encounter **       Pt lives with mother and other relatives.  Currently unemployed.  Receives outpatient psychiatry services through [facility].   Social Determinants of Health   Financial Resource Strain: Not on file  Food Insecurity: Food Insecurity Present (10/25/2021)   Hunger Vital Sign    Worried About Running Out of Food in the Last Year: Often true    Ran Out of Food in the Last Year: Often true  Transportation Needs: Unmet Transportation Needs (10/25/2021)   PRAPARE - Administrator, Civil Service (Medical): Yes    Lack of Transportation (Non-Medical): Yes  Physical Activity: Not on file  Stress: Not on file  Social Connections: Not on file   Additional Social History:    Sleep: Fair  Appetite:  Fair  Current Medications: Current Facility-Administered Medications  Medication Dose Route Frequency Provider Last Rate Last Admin   acetaminophen (TYLENOL) tablet 650 mg  650 mg Oral Q6H PRN Eligha Bridegroom, NP       amLODipine (NORVASC) tablet 5 mg  5 mg Oral Daily Eligha Bridegroom, NP   5 mg at 10/31/21 1218   diphenhydrAMINE (BENADRYL) capsule 25 mg  25 mg Oral QHS Phelan Goers, NP   25 mg at 10/30/21 2114   feeding supplement (ENSURE ENLIVE / ENSURE PLUS) liquid 237 mL  237 mL Oral BID BM Massengill, Harrold Donath, MD   237 mL at 10/31/21 1446   fluPHENAZine (PROLIXIN) tablet 5 mg  5 mg Oral BID Starleen Blue, NP   5 mg at 10/31/21 1218   risperiDONE (RISPERDAL M-TABS)  disintegrating tablet 2 mg  2 mg Oral Q8H PRN Massengill, Harrold Donath, MD   2 mg at 10/26/21 1633   And   LORazepam (ATIVAN) tablet 1 mg  1 mg Oral Q6H PRN Massengill, Harrold Donath, MD       And   ziprasidone (GEODON) injection 20 mg  20 mg Intramuscular Q6H PRN Massengill, Nathan, MD       melatonin tablet 3 mg  3 mg Oral QHS PRN Eligha Bridegroom, NP   3 mg at 10/28/21 2108   multivitamin with minerals tablet 1 tablet  1 tablet Oral Daily Massengill, Nathan, MD   1 tablet at 10/31/21 1218   pantoprazole (PROTONIX) EC tablet 40 mg  40 mg Oral Daily Eligha Bridegroom, NP   40 mg at 10/31/21 1218   traZODone (DESYREL) tablet 50 mg  50 mg Oral QHS Starleen Blue, NP   50 mg at 10/30/21 2113   valbenazine [facility])  capsule 40 mg  40 mg Oral Daily Vesta Mixer, NP   40 mg at 10/31/21 1218   Lab Results:  No results found for this or any previous visit (from the past 48 hour(s)).  Blood Alcohol level:  Lab Results  Component Value Date   ETH <10 10/25/2021   ETH <10 53/66/4403   Metabolic Disorder Labs: Lab Results  Component Value Date   HGBA1C 5.7 (H) 10/27/2021   MPG 116.89 10/27/2021   MPG 122.63 06/02/2021   Lab Results  Component Value Date   PROLACTIN 19.9 (H) 09/06/2021   PROLACTIN 17.5 (H) 06/02/2021   Lab Results  Component Value Date   CHOL 137 06/02/2021   TRIG 105 06/02/2021   HDL 36 (L) 06/02/2021   CHOLHDL 3.8 06/02/2021   VLDL 21 06/02/2021   LDLCALC 80 06/02/2021   LDLCALC 80 01/30/2021   Physical Findings: AIMS: Facial and Oral Movements Muscles of Facial Expression: None, normal Lips and Perioral Area: None, normal Jaw: None, normal Tongue: None, normal0Extremity Movements Upper (arms, wrists, hands, fingers): None, normal Lower (legs, knees, ankles, toes): None, normal, Trunk Movements Neck, shoulders, hips: None, normal, Overall Severity Severity of abnormal movements (highest score from questions above): None, normal Incapacitation due to abnormal  movements: None, normal Patient's awareness of abnormal movements (rate only patient's report): No Awareness, Dental Status Current problems with teeth and/or dentures?: No Does patient usually wear dentures?: No  CIWA:n/a    COWS: n/a   Musculoskeletal: Strength & Muscle Tone: within normal limits Gait & Station: normal Patient leans: N/A  Psychiatric Specialty Exam:  Presentation  General Appearance:  Disheveled  Eye Contact: Poor  Speech: Clear and Coherent  Speech Volume: Normal  Handedness: Right  Mood and Affect  Mood: Depressed  Affect: Congruent  Thought Process  Thought Processes: Coherent  Descriptions of Associations:Intact  Orientation:Partial  Thought Content:Illogical  History of Schizophrenia/Schizoaffective disorder:Yes  Duration of Psychotic Symptoms:Greater than six months  Hallucinations:Hallucinations: Auditory  Ideas of Reference:Delusions  Suicidal Thoughts:Suicidal Thoughts: No  Homicidal Thoughts:Homicidal Thoughts: No  Sensorium  Memory: Immediate Good  Judgment: Poor  Insight: Poor  Executive Functions  Concentration: Fair  Attention Span: Fair  Recall: [place] of Knowledge: Fair  Language: Fair  Psychomotor Activity  Psychomotor Activity: Psychomotor Activity: Normal  Assets  Assets: Resilience  Sleep  Sleep: Sleep: Fair  Physical Exam: Physical Exam Vitals and nursing note reviewed.  Constitutional:      Appearance: He is normal weight.  HENT:     Head: Normocephalic.     Nose: Nose normal.     Mouth/Throat:     Pharynx: Oropharynx is clear.  Eyes:     Pupils: Pupils are equal, round, and reactive to light.  Cardiovascular:     Pulses: Normal pulses.  Pulmonary:     Effort: Pulmonary effort is normal.  Abdominal:     Palpations: Abdomen is soft.  Musculoskeletal:        General: Normal range of motion.  Skin:    General: Skin is dry.  Neurological:     Mental Status:  He is oriented to person, place, and time.  Psychiatric:        Attention and Perception: He perceives auditory hallucinations.        Mood and Affect: Mood is depressed. Affect is flat.        Speech: He is noncommunicative.        Behavior: Behavior is withdrawn.  Thought Content: Thought content is delusional. Thought content does not include homicidal or suicidal ideation. Thought content does not include homicidal or suicidal plan.        Cognition and Memory: Cognition normal.    Review of Systems  Constitutional:  Negative for fever.  HENT:  Negative for hearing loss and sore throat.   Respiratory:  Negative for cough.   Cardiovascular:  Negative for chest pain.  Gastrointestinal:  Negative for heartburn and nausea.  Genitourinary:  Negative for dysuria.  Musculoskeletal:  Positive for myalgias (right arm pain).  Skin:  Negative for rash.  Neurological: Negative.  Negative for tremors, weakness and headaches.  Psychiatric/Behavioral:  Positive for depression (denies SI, denies HI), hallucinations (+AH of voices) and substance abuse (meth abuse). Negative for memory loss and suicidal ideas. The patient is nervous/anxious (improving) and has insomnia (improving).   All other systems reviewed and are negative.  Blood pressure 119/68, pulse 71, temperature 98.3 F (36.8 C), temperature source Oral, resp. rate 16, height 6\' 1"  (1.854 m), weight 66.2 kg, SpO2 99 %. Body mass index is 19.26 kg/m.  Treatment Plan Summary: Daily contact with patient to assess and evaluate symptoms and progress in treatment, Medication management, and Plan :   PLAN Safety and Monitoring: Voluntary admission to inpatient psychiatric unit for safety, stabilization and treatment Daily contact with patient to assess and evaluate symptoms and progress in treatment Patient's case to be discussed in multi-disciplinary team meeting Observation Level : q15 minute checks Vital signs: q12  hours Precautions: Safety   Long Term Goal(s): Improvement in symptoms so as ready for discharge   Short Term Goals: Ability to identify changes in lifestyle to reduce recurrence of condition will improve, Ability to disclose and discuss suicidal ideas, Ability to demonstrate self-control will improve, Ability to identify and develop effective coping behaviors will improve, Compliance with prescribed medications will improve, and Ability to identify triggers associated with substance abuse/mental health issues will improve.  Diagnoses:  Principal Problem:   Schizophrenia (HCC) Active Problems:   Tardive dyskinesia   Anxiety state   Insomnia   Essential hypertension, benign   Schizophrenia -Continue Prolixin 5 mg  (started on 10/22 @ 2200) -Discontinued Geodon at admission d/t lack of efficacy -Previously discontinued Invega d/t lack of efficacy (on 10/22). Has also failed Zyprexa, in the past -Continue Ingrezza 40 mg daily for EPS -Continue Benadryl 25 mg nightly for mild stiffness to b/l upper extremities   Anxiety -Continue Hydroxyzine 25 mg every 6 hours PRN   Insomnia -Continue melatonin 3 mg nightly  -Continue Trazodone 50 mg nightly PRN   Hypertension -Continue Norvasc 5 mg daily   Other PRNS -Continue Tylenol 650 mg every 6 hours PRN for mild pain -Continue Maalox 30 mg every 4 hrs PRN for indigestion -Continue Milk of Magnesia as needed every 6 hrs for constipation   3. Discharge Planning: Social work and case management to assist with discharge planning and identification of hospital follow-up needs prior to discharge Estimated LOS: 5-7 days Discharge Concerns: Need to establish a safety plan; Medication compliance and effectiveness Discharge Goals: Return home with outpatient referrals for mental health follow-up including medication management/psychotherapy.  11/22, NP 10/31/2021, 2:47 PMPatient ID: 11/02/2021, male   DOB: January 13, 1985, 36 y.o.   MRN:  [id number] Patient ID: Wesley Peterson, male   DOB: Dec 13, 1985, 36 y.o.   Patient ID: Wesley Peterson, male   DOB: [birth year], 36 y.o.   MRN: [id number]

## 2021-10-31 NOTE — Progress Notes (Signed)
   10/31/21 1000  Psych Admission Type (Psych Patients Only)  Admission Status Involuntary  Psychosocial Assessment  Patient Complaints Depression  Eye Contact Brief  Facial Expression Flat  Affect Appropriate to circumstance  Speech Logical/coherent  Interaction Guarded  Motor Activity Slow  Appearance/Hygiene Unremarkable  Behavior Characteristics Unwilling to participate  Mood Depressed  Thought Process  Coherency WDL  Content Ambivalence  Delusions None reported or observed  Perception Hallucinations  Hallucination Auditory  Judgment Impaired  Confusion None  Danger to Self  Current suicidal ideation? Denies  Danger to Others  Danger to Others None reported or observed

## 2021-10-31 NOTE — Group Note (Signed)
LCSW Group Therapy Note   Group Date: 10/31/2021 Start Time: 1300 End Time: 1400  Type of Therapy: Group Therapy: Boundaries  Participation: Did not attend   Description of Group: This group will address the use of boundaries in their personal lives. Patients will explore why boundaries are important, the difference between healthy and unhealthy boundaries, and negative and postive outcomes of different boundaries and will look at how boundaries can be crossed.  Patients will be encouraged to identify current boundaries in their own lives and identify what kind of boundary is being set. Facilitators will guide patients in utilizing problem-solving interventions to address and correct types boundaries being used and to address when no boundary is being used. Understanding and applying boundaries will be explored and addressed for obtaining and maintaining a balanced life. Patients will be encouraged to explore ways to assertively make their boundaries and needs known to significant others in their lives, using other group members and facilitator for role play, support, and feedback.   Therapeutic Goals: 1. Patient will identify areas in their life where setting clear boundaries could be used to improve their life.  2. Patient will identify signs/triggers that a boundary is not being respected. 3. Patient will identify two ways to set boundaries in order to achieve balance in their lives: 4. Patient will demonstrate ability to communicate their needs and set boundaries through discussion and/or role plays  Summary of Progress/Problems: Did not attend   Darleen Crocker, [place] 10/31/2021  2:14 PM

## 2021-10-31 NOTE — Progress Notes (Signed)
Adult Psychoeducational Group Note  Date:  10/31/2021 Time:  9:30 PM  Group Topic/Focus:  AA Group  Participation Level:  Minimal  Participation Quality:  Attentive  Affect:  Appropriate  Cognitive:  Appropriate  Insight: None  Engagement in Group:  None  Modes of Intervention:  Discussion, Education, and Support  Additional Comments:   Pt attended the [place] group. Pt was calm, cooperative and attentive during the group.  Wesley Peterson 10/31/2021, 9:30 PM

## 2021-10-31 NOTE — [facility] Group Notes (Signed)
Spiritual care group on grief and loss facilitated by chaplain Katy Tiersa Dayley, [facility]   Group Goal:   Support / Education around grief and loss   Members engage in facilitated group support and psycho-social education.   Group Description:   Following introductions and group rules, group members engaged in facilitated group dialog and support around topic of loss, with particular support around experiences of loss in their lives. Group Identified types of loss (relationships / self / things) and identified patterns, circumstances, and changes that precipitate losses. Reflected on thoughts / feelings around loss, normalized grief responses, and recognized variety in grief experience. Group noted Worden's four tasks of grief in discussion.   Group drew on Adlerian / Rogerian, narrative, MI,   Patient Progress: Did not attend.  

## 2021-11-01 DIAGNOSIS — F200 Paranoid schizophrenia: Secondary | ICD-10-CM | POA: Diagnosis not present

## 2021-11-01 MED ORDER — IBUPROFEN 400 MG PO TABS: 400.0000 mg | ORAL_TABLET | Freq: Four times a day (QID) | ORAL | Status: DC | PRN

## 2021-11-01 MED ORDER — DIPHENHYDRAMINE HCL 50 MG PO CAPS
50.0000 mg | ORAL_CAPSULE | Freq: Every day | ORAL | Status: DC
  Administered 2021-11-01 – 2021-11-03 (×3): 50 mg via ORAL
  Filled 2021-11-01: qty 7
  Filled 2021-11-01: qty 1
  Filled 2021-11-01: qty 7
  Filled 2021-11-01: qty 1

## 2021-11-01 MED ORDER — DOCUSATE SODIUM 100 MG PO CAPS
100.0000 mg | ORAL_CAPSULE | Freq: Every day | ORAL | Status: DC
  Administered 2021-11-01 – 2021-11-04 (×4): 100 mg via ORAL
  Filled 2021-11-01 (×5): qty 1

## 2021-11-01 MED ORDER — FLUPHENAZINE HCL 5 MG PO TABS
10.0000 mg | ORAL_TABLET | Freq: Two times a day (BID) | ORAL | Status: DC
  Administered 2021-11-01 – 2021-11-04 (×6): 10 mg via ORAL
  Filled 2021-11-01 (×2): qty 1
  Filled 2021-11-01 (×3): qty 28
  Filled 2021-11-01 (×2): qty 1
  Filled 2021-11-01: qty 28

## 2021-11-01 NOTE — Progress Notes (Signed)
[facility] MD Progress Note  11/01/2021 3:07 PM Wesley Peterson  MRN:  [id number] Subjective:    History of Present Illness: Wesley Peterson is a 36 yo [place] American male with a prior mental health history of schizophrenia who presented to the [facility] with worsening psychosis and confusion in the context of medication noncompliance and methamphetamine abuse.  Patient was involuntarily committed and transferred to this [facility] for treatment and stabilization of his mental status.   Past 24 hours: BP normalized towards yesterday afternoon. Pt's assigned RN notified of a need for current vital signs. Pt is continuing to be compliant with his scheduled medications, did not require any anti anxiety meds, or medications for agitation over the past 24 hrs. Pt  has continued to be isolative to his room as per staff reports that does not attended unit group activities in the last 24 hrs.  Today's patient assessment note: During this encounter, pt able to keep eyes open and only intermittently closes them.  Patient does admit to hearing voices, states that the voices are not as loud as they were during admission.  He states that the voices are multiple and talking randomly at the same time.  He reports that he is not able to make out what the voices are saying.  He also endorses thought insertion and thought withdrawal, and thought broadcasting.  Patient reports that the voices started 2 years ago, he contradicts himself multiple times; states that sometimes, the voices "go away", but is unable to state what medications he took in the past that helped with the voices. As assessment progressed, pt stated that the voices never go away. He does not seem to be responding to any internal or external stimuli today. Unsure how forthcoming he is being with reporting his current mental status, as he provides multiple "I don't know"s  in response being asked. Patient denies SI, denies HI, denies visual  hallucinations, denies paranoia.   Writer again discussed Long acting injectable medications with pt today and he continues to refuse wanting to try it. Pt agreed to think about it today and we wil;l revisit the topic tomorrow. He states that his Physician "Dr. Geanie Cooley" at [facility] in [place] told him that he should only take medications by mouth.   Pt reports a poor sleep quality last night, states that he had trouble going to sleep and trouble staying asleep. He reports a good appetite. He states that he has not had a bowel movement for about 3 days. Will order Colace daily. Pt reports pain to left arm, and on assessment, has mild stiffness to left arm. Increasing Benadryl to 50 mg nightly for stiffness and to help with sleep. Adding Ibuprofen 400 mg Q6 H PRN for pain to left arm.Also increasing Prolixin to 10 mg BID for psychosis.  Labs reviewed: HA1C is 5.7 rendering pt a prediabetic, educated on healthy food choices, but not in the mental state at this time to comprehend education. Will need education reinforcement as well as PCP f/u after discharge.  Principal Problem: Schizoaffective disorder, depressive type (HCC) Diagnosis: Principal Problem:   Schizoaffective disorder, depressive type (HCC) Active Problems:   Schizophrenia, paranoid (HCC)   Schizophrenia (HCC)   Tardive dyskinesia   Anxiety state   Insomnia   Essential hypertension, benign  Total Time spent with patient: 30 minutes  Past Psychiatric History: schizoaffective disorder bipolar type, tardive dyskinesia, insomnia, suicidal ideation, psychosis, auditory hallucinations, anxiety; multiple inpatient admissions  Past Medical History:  Past Medical History:  Diagnosis Date   Schizophrenia (HCC)    Schizophrenia, paranoid type (HCC) 02/02/2021   History reviewed. No pertinent surgical history. Family History: History reviewed. No pertinent family history. Family Psychiatric  History: not noted Social History:  Social  History   Substance and Sexual Activity  Alcohol Use Not Currently   Alcohol/week: 1.0 standard drink of alcohol   Types: 1 Cans of beer per week   Comment: occasional     Social History   Substance and Sexual Activity  Drug Use Never    Social History   Socioeconomic History   Marital status: Single    Spouse name: Not on file   Number of children: 1   Years of education: Not on file   Highest education level: Not on file  Occupational History   Occupation: Unemployed  Tobacco Use   Smoking status: Never    Passive exposure: Never   Smokeless tobacco: Current   Tobacco comments:    Vaping nicotine   Vaping Use   Vaping Use: Not on file  Substance and Sexual Activity   Alcohol use: Not Currently    Alcohol/week: 1.0 standard drink of alcohol    Types: 1 Cans of beer per week    Comment: occasional   Drug use: Never   Sexual activity: Never  Other Topics Concern   Not on file  Social History Narrative   ** Merged History Encounter **       Pt lives with mother and other relatives.  Currently unemployed.  [facility]   Social Determinants of Health   Financial Resource Strain: Not on file  Food Insecurity: Food Insecurity Present (10/25/2021)   Hunger Vital Sign    Worried About Running Out of Food in the Last Year: Often true    Ran Out of Food in the Last Year: Often true  Transportation Needs: Unmet Transportation Needs (10/25/2021)   PRAPARE - Administrator, Civil ServiceTransportation    Lack of Transportation (Medical): Yes    Lack of Transportation (Non-Medical): Yes  Physical Activity: Not on file  Stress: Not on file  Social Connections: Not on file   Additional Social History:    Sleep: Fair  Appetite:  Fair  Current Medications: Current Facility-Administered Medications  Medication Dose Route Frequency Provider Last Rate Last Admin   acetaminophen (TYLENOL) tablet 650 mg  650 mg Oral Q6H PRN Eligha Bridegroomoleman, Mikaela, NP        amLODipine (NORVASC) tablet 5 mg  5 mg Oral Daily Eligha Bridegroomoleman, Mikaela, NP   5 mg at 11/01/21 0831   diphenhydrAMINE (BENADRYL) capsule 50 mg  50 mg Oral QHS Murry Khiev, NP       docusate sodium (COLACE) capsule 100 mg  100 mg Oral Daily Acelin Ferdig, NP       feeding supplement (ENSURE ENLIVE / ENSURE PLUS) liquid 237 mL  237 mL Oral BID BM Massengill, Nathan, MD   237 mL at 11/01/21 1441   fluPHENAZine (PROLIXIN) tablet 10 mg  10 mg Oral BID Allahna Husband, Tyler Aasoris, NP       ibuprofen (ADVIL) tablet 400 mg  400 mg Oral Q6H PRN Starleen BlueNkwenti, Malik Ruffino, NP       risperiDONE (RISPERDAL M-TABS) disintegrating tablet 2 mg  2 mg Oral Q8H PRN Massengill, Harrold DonathNathan, MD   2 mg at 10/26/21 1633   And   LORazepam (ATIVAN) tablet 1 mg  1 mg Oral Q6H PRN Phineas InchesMassengill, Nathan, MD  And   ziprasidone (GEODON) injection 20 mg  20 mg Intramuscular Q6H PRN Massengill, Nathan, MD       melatonin tablet 3 mg  3 mg Oral QHS PRN Eligha Bridegroom, NP   3 mg at 10/28/21 2108   multivitamin with minerals tablet 1 tablet  1 tablet Oral Daily Massengill, Nathan, MD   1 tablet at 11/01/21 0831   pantoprazole (PROTONIX) EC tablet 40 mg  40 mg Oral Daily Eligha Bridegroom, NP   40 mg at 11/01/21 0831   traZODone (DESYREL) tablet 50 mg  50 mg Oral QHS Starleen Blue, NP   50 mg at 10/31/21 2149   valbenazine (INGREZZA) capsule 40 mg  40 mg Oral Daily Eligha Bridegroom, NP   40 mg at 11/01/21 0831   Lab Results:  No results found for this or any previous visit (from the past 48 hour(s)).  Blood Alcohol level:  Lab Results  Component Value Date   ETH <10 10/25/2021   ETH <10 08/31/2021   Metabolic Disorder Labs: Lab Results  Component Value Date   HGBA1C 5.7 (H) 10/27/2021   MPG 116.89 10/27/2021   MPG 122.63 06/02/2021   Lab Results  Component Value Date   PROLACTIN 19.9 (H) 09/06/2021   PROLACTIN 17.5 (H) 06/02/2021   Lab Results  Component Value Date   CHOL 137 06/02/2021   TRIG 105 06/02/2021   HDL 36 (L) 06/02/2021    CHOLHDL 3.8 06/02/2021   VLDL 21 06/02/2021   LDLCALC 80 06/02/2021   LDLCALC 80 01/30/2021   Physical Findings: AIMS: Facial and Oral Movements Muscles of Facial Expression: None, normal Lips and Perioral Area: None, normal Jaw: None, normal Tongue: None, normal0Extremity Movements Upper (arms, wrists, hands, fingers): None, normal Lower (legs, knees, ankles, toes): None, normal, Trunk Movements Neck, shoulders, hips: None, normal, Overall Severity Severity of abnormal movements (highest score from questions above): None, normal Incapacitation due to abnormal movements: None, normal Patient's awareness of abnormal movements (rate only patient's report): No Awareness, Dental Status Current problems with teeth and/or dentures?: No Does patient usually wear dentures?: No  CIWA:n/a    COWS: n/a   Musculoskeletal: Strength & Muscle Tone: within normal limits Gait & Station: normal Patient leans: N/A  Psychiatric Specialty Exam:  Presentation  General Appearance:  Disheveled  Eye Contact: Poor  Speech: Clear and Coherent  Speech Volume: Normal  Handedness: Right  Mood and Affect  Mood: Depressed  Affect: Congruent  Thought Process  Thought Processes: Coherent  Descriptions of Associations:Intact  Orientation:Partial  Thought Content:Illogical  History of Schizophrenia/Schizoaffective disorder:Yes  Duration of Psychotic Symptoms:Greater than six months  Hallucinations:Hallucinations: Auditory  Ideas of Reference:Delusions  Suicidal Thoughts:Suicidal Thoughts: No  Homicidal Thoughts:Homicidal Thoughts: No  Sensorium  Memory: Immediate Good  Judgment: Poor  Insight: Poor  Executive Functions  Concentration: Fair  Attention Span: Fair  Recall: Fair  Fund of Knowledge: Fair  Language: Fair  Psychomotor Activity  Psychomotor Activity: Psychomotor Activity: Normal  Assets  Assets: Resilience  Sleep  Sleep: Sleep:  Fair  Physical Exam: Physical Exam Vitals and nursing note reviewed.  Constitutional:      Appearance: He is normal weight.  HENT:     Head: Normocephalic.     Nose: Nose normal.     Mouth/Throat:     Pharynx: Oropharynx is clear.  Eyes:     Pupils: Pupils are equal, round, and reactive to light.  Cardiovascular:     Pulses: Normal pulses.  Pulmonary:  Effort: Pulmonary effort is normal.  Abdominal:     Palpations: Abdomen is soft.  Musculoskeletal:        General: Normal range of motion.  Skin:    General: Skin is dry.  Neurological:     Mental Status: He is oriented to person, place, and time.  Psychiatric:        Attention and Perception: He perceives auditory hallucinations.        Mood and Affect: Mood is depressed. Affect is flat.        Speech: He is noncommunicative.        Behavior: Behavior is withdrawn.        Thought Content: Thought content is delusional. Thought content does not include homicidal or suicidal ideation. Thought content does not include homicidal or suicidal plan.        Cognition and Memory: Cognition normal.    Review of Systems  Constitutional:  Negative for fever.  HENT:  Negative for hearing loss and sore throat.   Respiratory:  Negative for cough.   Cardiovascular:  Negative for chest pain.  Gastrointestinal:  Negative for heartburn and nausea.  Genitourinary:  Negative for dysuria.  Musculoskeletal:  Positive for myalgias (right arm pain).  Skin:  Negative for rash.  Neurological: Negative.  Negative for tremors, weakness and headaches.  Psychiatric/Behavioral:  Positive for depression (denies SI, denies HI), hallucinations (+AH of voices) and substance abuse (meth abuse). Negative for memory loss and suicidal ideas. The patient is nervous/anxious (improving) and has insomnia (improving).   All other systems reviewed and are negative.  Blood pressure 126/80, pulse 89, temperature 98 F (36.7 C), temperature source Oral, resp. rate  14, height 6\' 1"  (1.854 m), weight 66.2 kg, SpO2 96 %. Body mass index is 19.26 kg/m.  Treatment Plan Summary: Daily contact with patient to assess and evaluate symptoms and progress in treatment, Medication management, and Plan :   PLAN Safety and Monitoring: Voluntary admission to inpatient psychiatric unit for safety, stabilization and treatment Daily contact with patient to assess and evaluate symptoms and progress in treatment Patient's case to be discussed in multi-disciplinary team meeting Observation Level : q15 minute checks Vital signs: q12 hours Precautions: Safety   Long Term Goal(s): Improvement in symptoms so as ready for discharge   Short Term Goals: Ability to identify changes in lifestyle to reduce recurrence of condition will improve, Ability to disclose and discuss suicidal ideas, Ability to demonstrate self-control will improve, Ability to identify and develop effective coping behaviors will improve, Compliance with prescribed medications will improve, and Ability to identify triggers associated with substance abuse/mental health issues will improve.  Diagnoses:  Principal Problem:   Schizophrenia (HCC) Active Problems:   Tardive dyskinesia   Anxiety state   Insomnia   Essential hypertension, benign   Schizophrenia -Increase Prolixin 10 mg BID (started on 10/22 @ 2200) -Discontinued Geodon at admission d/t lack of efficacy -Previously discontinued Invega d/t lack of efficacy (on 10/22). Has also failed Zyprexa, in the past -Continue Ingrezza 40 mg daily for EPS -Increase Benadryl to 50 mg nightly for mild stiffness to b/l upper extremities   Anxiety -Continue Hydroxyzine 25 mg every 6 hours PRN   Insomnia -Continue melatonin 3 mg nightly  -Continue Trazodone 50 mg nightly PRN   Hypertension -Continue Norvasc 5 mg daily   Other PRNS -Start Ibuprofen 400 mg Q 6 H PRN for pain -Continue Tylenol 650 mg every 6 hours PRN for mild pain -Continue Maalox 30  mg  every 4 hrs PRN for indigestion -Continue Milk of Magnesia as needed every 6 hrs for constipation   3. Discharge Planning: Social work and case management to assist with discharge planning and identification of hospital follow-up needs prior to discharge Estimated LOS: 5-7 days Discharge Concerns: Need to establish a safety plan; Medication compliance and effectiveness Discharge Goals: Return home with outpatient referrals for mental health follow-up including medication management/psychotherapy.  Nicholes Rough, NP 11/01/2021, 3:07 PMPatient ID: Wesley Peterson, male   DOB: 1985/09/28, 35 y.o.   MRN: [id number] Patient ID: Wesley Peterson, male   DOB: October 29, 1985, 36 y.o.   Patient ID: Wesley Peterson, male   DOB: 1985-01-20, 36 y.o.   MRN: [id number] Patient ID: Wesley Peterson, male   DOB: 10-13-1985, 36 y.o.   MRN: [id number]

## 2021-11-01 NOTE — [facility] Group Notes (Signed)
[facility] Group Notes:  (Nursing/MHT/Case Management/Adjunct)  Date:  11/01/2021  Time:  1:23 PM  Type of Therapy:  Goals Group: The focus of this group is to help patients establish daily goals to achieve during treatment and discuss how the patient can incorporate goal setting into their daily lives to aide in recovery.   Participation Level:  Did Not Attend   Summary of Progress/Problems:   Patient did not attend goals group today.   Wesley Peterson 11/01/2021, 1:23 PM 

## 2021-11-01 NOTE — Progress Notes (Signed)
   11/01/21 0800  Psych Admission Type (Psych Patients Only)  Admission Status Involuntary  Psychosocial Assessment  Patient Complaints Depression  Eye Contact Brief  Affect Flat  Speech Logical/coherent  Interaction Guarded  Motor Activity Slow  Appearance/Hygiene Unremarkable  Behavior Characteristics Guarded;Calm  Mood Depressed;Anhedonia  Aggressive Behavior  Effect No apparent injury  Thought Process  Coherency WDL  Content WDL  Delusions WDL  Perception Hallucinations  Hallucination Auditory  Judgment Impaired  Confusion None  Danger to Self  Current suicidal ideation? Denies  Agreement Not to Harm Self Yes  Description of Agreement verbal  Danger to Others  Danger to Others None reported or observed  Danger to Others Abnormal  Harmful Behavior to others No threats or harm toward other people  Destructive Behavior No threats or harm toward property

## 2021-11-01 NOTE — Progress Notes (Signed)
Adult Psychoeducational Group Note  Date:  11/01/2021 Time:  9:43 PM  Group Topic/Focus:  Wrap-Up Group:   The focus of this group is to help patients review their daily goal of treatment and discuss progress on daily workbooks.  Participation Level:  Did Not Attend  Participation Quality:   n/a  Affect:   n/a  Cognitive:   n/a  Insight: None  Engagement in Group:   n/a  Modes of Intervention:   n/a  Additional Comments:   Pt did not attend the Wrap Up group.  Wetzel Bjornstad Dasie Chancellor 11/01/2021, 9:43 PM

## 2021-11-01 NOTE — Progress Notes (Signed)
   11/01/21 0000  Psych Admission Type (Psych Patients Only)  Admission Status Involuntary  Psychosocial Assessment  Patient Complaints Depression  Eye Contact Brief  Facial Expression Flat  Affect Appropriate to circumstance  Speech Logical/coherent  Interaction Guarded  Motor Activity Slow  Appearance/Hygiene Unremarkable  Behavior Characteristics Unwilling to participate  Mood Depressed  Aggressive Behavior  Effect No apparent injury  Thought Process  Coherency WDL  Content WDL  Delusions WDL  Perception Hallucinations  Hallucination Auditory  Judgment Impaired  Confusion None  Danger to Self  Current suicidal ideation? Denies  Danger to Others  Danger to Others None reported or observed

## 2021-11-02 DIAGNOSIS — F200 Paranoid schizophrenia: Secondary | ICD-10-CM | POA: Diagnosis not present

## 2021-11-02 MED ORDER — TRAZODONE HCL 100 MG PO TABS
100.0000 mg | ORAL_TABLET | Freq: Every day | ORAL | Status: DC
  Administered 2021-11-02 – 2021-11-03 (×2): 100 mg via ORAL
  Filled 2021-11-02 (×4): qty 1

## 2021-11-02 MED ORDER — FLUPHENAZINE DECANOATE 25 MG/ML IJ SOLN
25.0000 mg | INTRAMUSCULAR | Status: DC
  Administered 2021-11-02: 25 mg via INTRAMUSCULAR
  Filled 2021-11-02: qty 1

## 2021-11-02 NOTE — Progress Notes (Signed)
CSW provided the Pt with a packet that contains information including shelter and housing resources, free and reduced price food information, clothing resources, crisis center information, a Good[employer] card, and suicide prevention information.   

## 2021-11-02 NOTE — Plan of Care (Signed)
  Problem: Education: Goal: Emotional status will improve Outcome: Progressing Goal: Mental status will improve Outcome: Progressing   Problem: Coping: Goal: Ability to demonstrate self-control will improve Outcome: Progressing   Problem: Coping: Goal: Coping ability will improve Outcome: Progressing   Problem: Health Behavior/Discharge Planning: Goal: Compliance with prescribed medication regimen will improve Outcome: Progressing   Problem: Role Relationship: Goal: Ability to interact with others will improve Outcome: Progressing

## 2021-11-02 NOTE — [facility] Group Notes (Signed)
Pt attended MHT group 

## 2021-11-02 NOTE — Progress Notes (Signed)
Patient did not attend morning orientation/goal group. ?

## 2021-11-02 NOTE — Group Note (Signed)
LCSW Group Therapy Note   Group Date: 11/02/2021 Start Time: 1300 End Time: 1400  Type of Therapy and Topic: Group Therapy: Effective Communication   Participation Level: Minimal   Description of Group:  In this group patients will be asked to identify their own styles of communication as well as defining and identifying passive, assertive, and aggressive styles of communication. Participants will identify strategies to communicate in a more assertive manner in an effort to appropriately meet their needs. This group will be process-oriented, with patients participating in exploration of their own experiences as well as giving and receiving support and challenge from other group members.   Therapeutic Goals: 1. Patient will identify their personal communication style. 2. Patient will identify passive, assertive, and aggressive forms of communication. 3. Patient will identify strategies for developing more effective communication to appropriately meet their needs.    Summary of Patient Progress: The Pt attended group and remained there the entire time.  The Pt accepted all worksheets and followed along throughout the group.  The Pt did not participate in the open discussion but was appropriate with their peers.        Therapeutic Modalities:  Communication Skills Solution Focused Therapy Motivational Interviewing  Darleen Crocker, [place] 11/02/2021  2:02 PM

## 2021-11-02 NOTE — Progress Notes (Signed)
   11/02/21 0544  Sleep  Number of Hours 6.5

## 2021-11-02 NOTE — Progress Notes (Signed)
[facility] MD Progress Note  11/02/2021 2:42 PM Wesley Peterson  MRN:  [id number] Subjective:    History of Present Illness: Wesley Peterson is a 36 yo [place] American male with a prior mental health history of schizophrenia who presented to the [facility] with worsening psychosis and confusion in the context of medication noncompliance and methamphetamine abuse.  Patient was involuntarily committed and transferred to this [facility] for treatment and stabilization of his mental status.   Past 24 hours: V/S WNL in last 24 hrs, pt slept a total of 6.5 hrs last night. Pt is taking of his medications as scheduled. Only PRN medication taken last night was Melatonin 3 mg for sleep. There were no behavioral episodes noted in the past 24 hrs.  Today's patient assessment note: His affect is less blunted, and he appears brighter. He approached Probation officer shortly after encounter and stated that he has had time to think about the LAI, and is agreeable to taking it. Orders placed for Prolixin 25 mg LAI to be given today. Pt denies SI, denies HI, denies VH, denies paranoia and there is no evidence of delusional thinking. He however, is continuing to present with +AH, states the voices are same as at admission. Pt does not seem to be responding to any internal stimuli, and seems not to be forthcoming about the progress that he has made on current medications, in fear of being discharged since he is homeless. Tentative discharge date for pt is Friday, 10/27 if all discharge f/u appointments are in place.  Pt reports a poor sleep quality, reports a good appetite, denies all medication related side effects, mild stiffness present in b/l upper extremities, but there is no involuntary movements in any of pt's extremities. Benadryl 50 mg nightly is ordered for stiffness and to help with sleep. Trazodone increased to 100 mg nightly for insomnia. Continuing all other meds as listed below.   Labs reviewed: HA1C  is 5.7 rendering pt a prediabetic, educated on healthy food choices, but not in the mental state at this time to comprehend education. Will need education reinforcement as well as PCP f/u after discharge.  Principal Problem: Schizoaffective disorder, depressive type ([place]) Diagnosis: Principal Problem:   Schizoaffective disorder, depressive type ([place]) Active Problems:   Schizophrenia, paranoid ([place])   Schizophrenia ([place])   Tardive dyskinesia   Anxiety state   Insomnia   Essential hypertension, benign  Total Time spent with patient: 30 minutes  Past Psychiatric History: schizoaffective disorder bipolar type, tardive dyskinesia, insomnia, suicidal ideation, psychosis, auditory hallucinations, anxiety; multiple inpatient admissions  Past Medical History:  Past Medical History:  Diagnosis Date   Schizophrenia ([place])    Schizophrenia, paranoid type ([place]) 02/02/2021   History reviewed. No pertinent surgical history. Family History: History reviewed. No pertinent family history. Family Psychiatric  History: not noted Social History:  Social History   Substance and Sexual Activity  Alcohol Use Not Currently   Alcohol/week: 1.0 standard drink of alcohol   Types: 1 Cans of beer per week   Comment: occasional     Social History   Substance and Sexual Activity  Drug Use Never    Social History   Socioeconomic History   Marital status: Single    Spouse name: Not on file   Number of children: 1   Years of education: Not on file   Highest education level: Not on file  Occupational History   Occupation: Unemployed  Tobacco Use   Smoking status: Never  Passive exposure: Never   Smokeless tobacco: Current   Tobacco comments:    Vaping nicotine   Vaping Use   Vaping Use: Not on file  Substance and Sexual Activity   Alcohol use: Not Currently    Alcohol/week: 1.0 standard drink of alcohol    Types: 1 Cans of beer per week    Comment: occasional   Drug use: Never   Sexual  activity: Never  Other Topics Concern   Not on file  Social History Narrative   ** Merged History Encounter **       Pt lives with mother and other relatives.  Currently unemployed.  Receives outpatient psychiatry services through [facility].   Social Determinants of Health   Financial Resource Strain: Not on file  Food Insecurity: Food Insecurity Present (10/25/2021)   Hunger Vital Sign    Worried About Running Out of Food in the Last Year: Often true    Ran Out of Food in the Last Year: Often true  Transportation Needs: Unmet Transportation Needs (10/25/2021)   PRAPARE - Administrator, Civil Service (Medical): Yes    Lack of Transportation (Non-Medical): Yes  Physical Activity: Not on file  Stress: Not on file  Social Connections: Not on file   Additional Social History:    Sleep: Fair  Appetite:  Fair  Current Medications: Current Facility-Administered Medications  Medication Dose Route Frequency Provider Last Rate Last Admin   acetaminophen (TYLENOL) tablet 650 mg  650 mg Oral Q6H PRN Eligha Bridegroom, NP       amLODipine (NORVASC) tablet 5 mg  5 mg Oral Daily Eligha Bridegroom, NP   5 mg at 11/02/21 0825   diphenhydrAMINE (BENADRYL) capsule 50 mg  50 mg Oral QHS Starleen Blue, NP   50 mg at 11/01/21 2130   docusate sodium (COLACE) capsule 100 mg  100 mg Oral Daily Starleen Blue, NP   100 mg at 11/02/21 0825   feeding supplement (ENSURE ENLIVE / ENSURE PLUS) liquid 237 mL  237 mL Oral BID BM Massengill, Harrold Donath, MD   237 mL at 11/02/21 1019   fluPHENAZine (PROLIXIN) tablet 10 mg  10 mg Oral BID Starleen Blue, NP   10 mg at 11/02/21 0825   fluPHENAZine decanoate (PROLIXIN) injection 25 mg  25 mg Intramuscular Q28 days Colena Ketterman, Tyler Aas, NP       ibuprofen (ADVIL) tablet 400 mg  400 mg Oral Q6H PRN Starleen Blue, NP       risperiDONE (RISPERDAL M-TABS) disintegrating tablet 2 mg  2 mg Oral Q8H PRN Massengill, Harrold Donath, MD   2 mg at 10/26/21 1633   And   LORazepam  (ATIVAN) tablet 1 mg  1 mg Oral Q6H PRN Massengill, Harrold Donath, MD       And   ziprasidone (GEODON) injection 20 mg  20 mg Intramuscular Q6H PRN Massengill, Nathan, MD       melatonin tablet 3 mg  3 mg Oral QHS PRN Eligha Bridegroom, NP   3 mg at 11/01/21 2129   multivitamin with minerals tablet 1 tablet  1 tablet Oral Daily Massengill, Nathan, MD   1 tablet at 11/02/21 0825   pantoprazole (PROTONIX) EC tablet 40 mg  40 mg Oral Daily Eligha Bridegroom, NP   40 mg at 11/02/21 0825   traZODone (DESYREL) tablet 100 mg  100 mg Oral QHS Dillen [place]montes, NP       valbenazine (INGREZZA) capsule 40 mg  40 mg Oral Daily Eligha Bridegroom, NP  40 mg at 11/02/21 0825   Lab Results:  No results found for this or any previous visit (from the past 48 hour(s)).  Blood Alcohol level:  Lab Results  Component Value Date   ETH <10 10/25/2021   ETH <10 99991111   Metabolic Disorder Labs: Lab Results  Component Value Date   HGBA1C 5.7 (H) 10/27/2021   MPG 116.89 10/27/2021   MPG 122.63 06/02/2021   Lab Results  Component Value Date   PROLACTIN 19.9 (H) 09/06/2021   PROLACTIN 17.5 (H) 06/02/2021   Lab Results  Component Value Date   CHOL 137 06/02/2021   TRIG 105 06/02/2021   HDL 36 (L) 06/02/2021   CHOLHDL 3.8 06/02/2021   VLDL 21 06/02/2021   LDLCALC 80 06/02/2021   LDLCALC 80 01/30/2021   Physical Findings: AIMS: Facial and Oral Movements Muscles of Facial Expression: None, normal Lips and Perioral Area: None, normal Jaw: None, normal Tongue: None, normal0Extremity Movements Upper (arms, wrists, hands, fingers): None, normal Lower (legs, knees, ankles, toes): None, normal, Trunk Movements Neck, shoulders, hips: None, normal, Overall Severity Severity of abnormal movements (highest score from questions above): None, normal Incapacitation due to abnormal movements: None, normal Patient's awareness of abnormal movements (rate only patient's report): No Awareness, Dental Status Current  problems with teeth and/or dentures?: No Does patient usually wear dentures?: No  CIWA:n/a    COWS: n/a   Musculoskeletal: Strength & Muscle Tone: within normal limits Gait & Station: normal Patient leans: N/A  Psychiatric Specialty Exam:  Presentation  General Appearance:  Appropriate for Environment; Fairly Groomed  Eye Contact: Fair  Speech: Clear and Coherent  Speech Volume: Normal  Handedness: Right  Mood and Affect  Mood: Depressed  Affect: Congruent  Thought Process  Thought Processes: Coherent  Descriptions of Associations:Intact  Orientation:Partial  Thought Content:Logical  History of Schizophrenia/Schizoaffective disorder:Yes  Duration of Psychotic Symptoms:Greater than six months  Hallucinations:Hallucinations: Auditory   Ideas of Reference:None  Suicidal Thoughts:Suicidal Thoughts: No   Homicidal Thoughts:Homicidal Thoughts: No   Sensorium  Memory: Immediate Good  Judgment: Poor  Insight: Poor  Executive Functions  Concentration: Fair  Attention Span: Fair  Recall: JAARS of Knowledge: Fair  Language: Fair  Psychomotor Activity  Psychomotor Activity: No data recorded  Assets  Assets: Resilience  Sleep  Sleep: Sleep: Poor   Physical Exam: Physical Exam Vitals and nursing note reviewed.  Constitutional:      Appearance: He is normal weight.  HENT:     Head: Normocephalic.     Nose: Nose normal.     Mouth/Throat:     Pharynx: Oropharynx is clear.  Eyes:     Pupils: Pupils are equal, round, and reactive to light.  Cardiovascular:     Pulses: Normal pulses.  Pulmonary:     Effort: Pulmonary effort is normal.  Abdominal:     Palpations: Abdomen is soft.  Musculoskeletal:        General: Normal range of motion.  Skin:    General: Skin is dry.  Neurological:     Mental Status: He is oriented to person, place, and time.  Psychiatric:        Attention and Perception: He perceives  auditory hallucinations.        Mood and Affect: Mood is depressed. Affect is flat.        Speech: He is noncommunicative.        Behavior: Behavior is withdrawn.        Thought Content: Thought content  is delusional. Thought content does not include homicidal or suicidal ideation. Thought content does not include homicidal or suicidal plan.        Cognition and Memory: Cognition normal.    Review of Systems  Constitutional:  Negative for fever.  HENT:  Negative for hearing loss and sore throat.   Respiratory:  Negative for cough.   Cardiovascular:  Negative for chest pain.  Gastrointestinal:  Negative for heartburn and nausea.  Genitourinary:  Negative for dysuria.  Musculoskeletal:  Positive for myalgias (right arm pain).  Skin:  Negative for rash.  Neurological: Negative.  Negative for tremors, weakness and headaches.  Psychiatric/Behavioral:  Positive for depression (denies SI, denies HI), hallucinations (+AH of voices) and substance abuse (meth abuse). Negative for memory loss and suicidal ideas. The patient is nervous/anxious (improving) and has insomnia (improving).   All other systems reviewed and are negative.  Blood pressure 126/80, pulse 89, temperature 98 F (36.7 C), temperature source Oral, resp. rate 14, height 6\' 1"  (1.854 m), weight 66.2 kg, SpO2 96 %. Body mass index is 19.26 kg/m.  Treatment Plan Summary: Daily contact with patient to assess and evaluate symptoms and progress in treatment, Medication management, and Plan :   PLAN Safety and Monitoring: Voluntary admission to inpatient psychiatric unit for safety, stabilization and treatment Daily contact with patient to assess and evaluate symptoms and progress in treatment Patient's case to be discussed in multi-disciplinary team meeting Observation Level : q15 minute checks Vital signs: q12 hours Precautions: Safety   Long Term Goal(s): Improvement in symptoms so as ready for discharge   Short Term Goals:  Ability to identify changes in lifestyle to reduce recurrence of condition will improve, Ability to disclose and discuss suicidal ideas, Ability to demonstrate self-control will improve, Ability to identify and develop effective coping behaviors will improve, Compliance with prescribed medications will improve, and Ability to identify triggers associated with substance abuse/mental health issues will improve.  Diagnoses:  Principal Problem:   Schizophrenia ([place]) Active Problems:   Tardive dyskinesia   Anxiety state   Insomnia   Essential hypertension, benign   Schizophrenia -Give Fluphenazine Dec 25 mg IM today (11/02/21) -Continue Prolixin 10 mg BID (started on 10/22 @ 2200) -Discontinued Geodon at admission d/t lack of efficacy -Previously discontinued Invega d/t lack of efficacy (on 10/22). Has also failed Zyprexa, in the past -Continue Ingrezza 40 mg daily for EPS -Continue Benadryl to 50 mg nightly for mild stiffness to b/l upper extremities   Anxiety -Continue Hydroxyzine 25 mg every 6 hours PRN   Insomnia -Continue melatonin 3 mg nightly  -Increase Trazodone to 100 mg nightly   Hypertension -Continue Norvasc 5 mg daily   Other PRNS -Continue Ibuprofen 400 mg Q 6 H PRN for pain -Continue Tylenol 650 mg every 6 hours PRN for mild pain -Continue Maalox 30 mg every 4 hrs PRN for indigestion -Continue Milk of Magnesia as needed every 6 hrs for constipation   3. Discharge Planning: Social work and case management to assist with discharge planning and identification of hospital follow-up needs prior to discharge Estimated LOS: 5-7 days Discharge Concerns: Need to establish a safety plan; Medication compliance and effectiveness Discharge Goals: Return home with outpatient referrals for mental health follow-up including medication management/psychotherapy.  Nicholes Rough, NP 11/02/2021, 2:42 PMPatient ID: [name], male   DOB: [birth year], 35 y.o.   MRN: [id number] Patient ID:  Blong Pike, male   DOB: 06-04-1985, 36 y.o.   Patient ID:

## 2021-11-02 NOTE — [facility] Suicide Risk Assessment (Signed)
[employer] INPATIENT:  Family/Significant Other Suicide Prevention Education  Suicide Prevention Education:  Patient Refusal for Family/Significant Other Suicide Prevention Education: The patient Wesley Peterson has refused to provide written consent for family/significant other to be provided Family/Significant Other Suicide Prevention Education during admission and/or prior to discharge.  Physician notified.  [name] 11/02/2021, 11:20 AM

## 2021-11-02 NOTE — Plan of Care (Signed)
  Problem: Education: Goal: Mental status will improve Outcome: Progressing   Problem: Activity: Goal: Sleeping patterns will improve Outcome: Progressing   Problem: Education: Goal: Will be free of psychotic symptoms Outcome: Progressing   Problem: Coping: Goal: Will verbalize feelings Outcome: Progressing   Problem: Health Behavior/Discharge Planning: Goal: Compliance with prescribed medication regimen will improve Outcome: Progressing   Problem: Education: Goal: Emotional status will improve Outcome: Not Progressing   Problem: Activity: Goal: Interest or engagement in activities will improve Outcome: Not Progressing   Problem: Health Behavior/Discharge Planning: Goal: Identification of resources available to assist in meeting health care needs will improve Outcome: Not Progressing   Problem: Activity: Goal: Will verbalize the importance of balancing activity with adequate rest periods Outcome: Not Progressing

## 2021-11-02 NOTE — Progress Notes (Signed)
D- Patient alert and oriented. Denies SI, HI, and pain. Patient denies AVH and none were observed. Patient rates depression as a 7/10 and anxiety at a 8/10. Assessment was performed in patient's room while he was laying in bed. Patient did not attend group, but later was seen in the hallway and interacted with staff.    A- Scheduled medications administered to patient, per MD orders. Nightly trazadone held due to effectiveness of other sedative medications administered and patient wanting melatonin for sleep. Support and encouragement provided.  Routine safety checks conducted every 15 minutes.  Patient informed to notify staff with problems or concerns.  R- No adverse drug reactions noted. Patient contracts for safety at this time. Patient compliant with medications and treatment plan. Patient receptive, calm, and cooperative. Patient interacts well with others on the unit.  Patient remains safe at this time.

## 2021-11-02 NOTE — Progress Notes (Signed)
Adult Psychoeducational Group Note  Date:  11/02/2021 Time:  9:45 PM  Group Topic/Focus:  Wrap-Up Group:   The focus of this group is to help patients review their daily goal of treatment and discuss progress on daily workbooks.  Participation Level:  Active  Participation Quality:  Appropriate  Affect:  Appropriate  Cognitive:  Appropriate  Insight: Appropriate  Engagement in Group:  Engaged  Modes of Intervention:  Discussion  Additional Comments:  Pt stated he had a good day.  Pt stated his goal was to catch up on rest.  Pt met goal.  Tonia Brooms D 11/02/2021, 9:45 PM

## 2021-11-03 DIAGNOSIS — F251 Schizoaffective disorder, depressive type: Secondary | ICD-10-CM | POA: Diagnosis not present

## 2021-11-03 NOTE — Progress Notes (Signed)
   11/03/21 0536  Sleep  Number of Hours 7.25

## 2021-11-03 NOTE — Progress Notes (Addendum)
D- Patient alert and oriented. Denies SI, HI, and pain. Patient endorses auditory hallucinations stating that they are the same since admission and telling him "Say something." Patient states that he feels that the voices are under control. No observation of the patient responding to internal stimuli was made by this RN. The patient was observed in the day room for group and after during free time. That patient's facial expression appeared flat, but exhibit animation at times. The patient rates his anxiety at an 8/10 and depression at 6/10.   A- Scheduled medications administered to patient, per MD orders. PRN melatonin given for sleep. Support and encouragement provided.  Routine safety checks conducted every 15 minutes.  Patient informed to notify staff with problems or concerns.  R- No adverse drug reactions noted. Patient contracts for safety at this time. Patient compliant with medications and treatment plan. Patient receptive, calm, and cooperative. Patient interacts well with others on the unit.  Patient remains safe at this time.

## 2021-11-03 NOTE — Progress Notes (Signed)
   11/03/21 2015  Psych Admission Type (Psych Patients Only)  Admission Status Involuntary  Psychosocial Assessment  Patient Complaints Anxiety;Depression  Eye Contact Brief  Facial Expression Flat  Affect Appropriate to circumstance  Speech Logical/coherent  Interaction Guarded  Motor Activity Slow  Appearance/Hygiene Unremarkable  Behavior Characteristics Anxious  Mood Depressed  Aggressive Behavior  Effect No apparent injury  Thought Process  Coherency WDL  Content WDL  Delusions WDL  Perception Hallucinations  Hallucination Auditory  Judgment Impaired  Confusion None  Danger to Self  Current suicidal ideation? Denies  Danger to Others  Danger to Others None reported or observed

## 2021-11-03 NOTE — Progress Notes (Signed)
[facility] MD Progress Note  11/03/2021 1:29 PM Wesley Peterson  MRN:  [id number] Subjective:    History of Present Illness:  Wesley Peterson is a 36 year old male with past psychiatric history of schizoaffective disorder bipolar type, tardive dyskinesia, insomnia, suicidal ideation, psychosis, auditory hallucinations, anxiety who [facility] with complaint of head injury, unable to state where he was noted to be disorganized and paranoid requiring frequent redirection. Upon receiving collateral information from father it was noted patient had not been compliant with psychotropic medications (Geodon) and made suicidal ideations day prior without a specific plan. Patient was IVC'd and [facility]. UDS+amphetamine (active Rx for Adderall noted in PDMP). BAL<10. Patient was involuntarily committed and transferred to this [facility] for treatment and stabilization of his mental status.  Past 24 hours: Vital signs WNL in last 24 hrs, pt slept a total of 7.25 hrs last night. Patient observed intermittently in milieu participating in group activities. He continues to take medications as scheduled. Only PRN Melatonin 3 mg given for sleep. Per chart review, he received Prolixin 25 mg IM LAI given yesterday. No behavioral episodes noted in the past 24 hrs.  Today's patient assessment note: Patient observed withdrawn in his room. Flat, withdrawn, constricted affect. Dysphoric mood. Reports attending meals and "some" groups. Sleep "okay". Described mood as "okay"; 6/10 depression. Denies any medication side effects; no involuntary movements, hypersalivation, or other symptoms noted. Reports plan to "get a room or hotel" upon discharge; denies any knowledge of how he is going to obtain either. He continues to deny any SI/HI/AVH, paranoia. He appears guarded, however does not appear to be responding to any external/internal stimuli, and not very  engaged in assessment. Per chart review, tentative discharge date for pt is Friday, 10/27 if all discharge f/u appointments are in place.  Most recent medication trials:  -Received Prolixin LAI 11/02/21 -Discontinued Geodon at admission d/t lack of efficacy -Previously discontinued Invega d/t lack of efficacy (on 10/22). Has also failed Zyprexa, in the past  Principal Problem: Schizoaffective disorder, depressive type (HCC) Diagnosis: Principal Problem:   Schizoaffective disorder, depressive type (HCC) Active Problems:   Schizophrenia, paranoid (HCC)   Schizophrenia (HCC)   Tardive dyskinesia   Anxiety state   Insomnia   Essential hypertension, benign  Total Time spent with patient: 30 minutes  Past Psychiatric History: schizoaffective disorder bipolar type, tardive dyskinesia, insomnia, suicidal ideation, psychosis, auditory hallucinations, anxiety; multiple inpatient admissions  Past Medical History:  Past Medical History:  Diagnosis Date   Schizophrenia (HCC)    Schizophrenia, paranoid type (HCC) 02/02/2021   History reviewed. No pertinent surgical history. Family History: History reviewed. No pertinent family history. Family Psychiatric  History: not noted Social History:  Social History   Substance and Sexual Activity  Alcohol Use Not Currently   Alcohol/week: 1.0 standard drink of alcohol   Types: 1 Cans of beer per week   Comment: occasional     Social History   Substance and Sexual Activity  Drug Use Never    Social History   Socioeconomic History   Marital status: Single    Spouse name: Not on file   Number of children: 1   Years of education: Not on file   Highest education level: Not on file  Occupational History   Occupation: Unemployed  Tobacco Use   Smoking status: Never    Passive exposure: Never   Smokeless tobacco: Current   Tobacco comments:  Vaping nicotine   Vaping Use   Vaping Use: Not on file  Substance and Sexual Activity    Alcohol use: Not Currently    Alcohol/week: 1.0 standard drink of alcohol    Types: 1 Cans of beer per week    Comment: occasional   Drug use: Never   Sexual activity: Never  Other Topics Concern   Not on file  Social History Narrative   ** Merged History Encounter **       Pt lives with mother and other relatives.  Currently unemployed.  Receives outpatient psychiatry services through [facility].   Social Determinants of Health   Financial Resource Strain: Not on file  Food Insecurity: Food Insecurity Present (10/25/2021)   Hunger Vital Sign    Worried About Running Out of Food in the Last Year: Often true    Ran Out of Food in the Last Year: Often true  Transportation Needs: Unmet Transportation Needs (10/25/2021)   PRAPARE - Administrator, Civil Service (Medical): Yes    Lack of Transportation (Non-Medical): Yes  Physical Activity: Not on file  Stress: Not on file  Social Connections: Not on file   Additional Social History:    Sleep: Fair  Appetite:  Fair  Current Medications: Current Facility-Administered Medications  Medication Dose Route Frequency Provider Last Rate Last Admin   acetaminophen (TYLENOL) tablet 650 mg  650 mg Oral Q6H PRN Eligha Bridegroom, NP       amLODipine (NORVASC) tablet 5 mg  5 mg Oral Daily Eligha Bridegroom, NP   5 mg at 11/03/21 0816   diphenhydrAMINE (BENADRYL) capsule 50 mg  50 mg Oral QHS Nkwenti, Doris, NP   50 mg at 11/02/21 2122   docusate sodium (COLACE) capsule 100 mg  100 mg Oral Daily Starleen Blue, NP   100 mg at 11/03/21 4696   feeding supplement (ENSURE ENLIVE / ENSURE PLUS) liquid 237 mL  237 mL Oral BID BM Massengill, Harrold Donath, MD   237 mL at 11/03/21 1054   fluPHENAZine (PROLIXIN) tablet 10 mg  10 mg Oral BID Starleen Blue, NP   10 mg at 11/03/21 2952   fluPHENAZine decanoate (PROLIXIN) injection 25 mg  25 mg Intramuscular Q28 days Starleen Blue, NP   25 mg at 11/02/21 1528   ibuprofen (ADVIL) tablet 400 mg  400 mg  Oral Q6H PRN Starleen Blue, NP       risperiDONE (RISPERDAL M-TABS) disintegrating tablet 2 mg  2 mg Oral Q8H PRN Massengill, Harrold Donath, MD   2 mg at 10/26/21 1633   And   LORazepam (ATIVAN) tablet 1 mg  1 mg Oral Q6H PRN Massengill, Harrold Donath, MD       And   ziprasidone (GEODON) injection 20 mg  20 mg Intramuscular Q6H PRN Massengill, Nathan, MD       melatonin tablet 3 mg  3 mg Oral QHS PRN Eligha Bridegroom, NP   3 mg at 11/02/21 2122   multivitamin with minerals tablet 1 tablet  1 tablet Oral Daily Massengill, Nathan, MD   1 tablet at 11/03/21 0816   pantoprazole (PROTONIX) EC tablet 40 mg  40 mg Oral Daily Eligha Bridegroom, NP   40 mg at 11/03/21 0817   traZODone (DESYREL) tablet 100 mg  100 mg Oral QHS Nkwenti, Doris, NP   100 mg at 11/02/21 2122   valbenazine (INGREZZA) capsule 40 mg  40 mg Oral Daily Eligha Bridegroom, NP   40 mg at 11/03/21 0816   Lab Results:  No results found for this or any previous visit (from the past 48 hour(s)).  Blood Alcohol level:  Lab Results  Component Value Date   ETH <10 10/25/2021   ETH <10 08/31/2021   Metabolic Disorder Labs: Lab Results  Component Value Date   HGBA1C 5.7 (H) 10/27/2021   MPG 116.89 10/27/2021   MPG 122.63 06/02/2021   Lab Results  Component Value Date   PROLACTIN 19.9 (H) 09/06/2021   PROLACTIN 17.5 (H) 06/02/2021   Lab Results  Component Value Date   CHOL 137 06/02/2021   TRIG 105 06/02/2021   HDL 36 (L) 06/02/2021   CHOLHDL 3.8 06/02/2021   VLDL 21 06/02/2021   LDLCALC 80 06/02/2021   LDLCALC 80 01/30/2021   Physical Findings: AIMS: Facial and Oral Movements Muscles of Facial Expression: None, normal Lips and Perioral Area: None, normal Jaw: None, normal Tongue: None, normal0Extremity Movements Upper (arms, wrists, hands, fingers): None, normal Lower (legs, knees, ankles, toes): None, normal, Trunk Movements Neck, shoulders, hips: None, normal, Overall Severity Severity of abnormal movements (highest score  from questions above): None, normal Incapacitation due to abnormal movements: None, normal Patient's awareness of abnormal movements (rate only patient's report): No Awareness, Dental Status Current problems with teeth and/or dentures?: No Does patient usually wear dentures?: No  CIWA:n/a    COWS: n/a   Musculoskeletal: Strength & Muscle Tone: within normal limits Gait & Station: normal Patient leans: N/A  Psychiatric Specialty Exam:  Presentation  General Appearance:  Casual  Eye Contact: Fair  Speech: Clear and Coherent  Speech Volume: Decreased  Handedness: Right  Mood and Affect  Mood: Depressed; Dysphoric  Affect: Flat; Congruent  Thought Process  Thought Processes: Linear  Descriptions of Associations:Intact  Orientation:Partial  Thought Content:Logical  History of Schizophrenia/Schizoaffective disorder:Yes  Duration of Psychotic Symptoms:Greater than six months  Hallucinations:Hallucinations: None (denies today)   Ideas of Reference:None  Suicidal Thoughts:Suicidal Thoughts: No   Homicidal Thoughts:Homicidal Thoughts: No   Sensorium  Memory: Immediate Fair; Recent Fair  Judgment: Poor  Insight: Lacking  Executive Functions  Concentration: Fair  Attention Span: Fair  Recall: Fair  Fund of Knowledge: Poor  Language: Fair  Psychomotor Activity  Psychomotor Activity: Psychomotor Activity: Normal   Assets  Assets: Resilience; Physical Health  Sleep  Sleep: Sleep: Good Number of Hours of Sleep: 7.25   Physical Exam: Physical Exam Vitals and nursing note reviewed.  Constitutional:      Appearance: He is normal weight.  HENT:     Head: Normocephalic.     Nose: Nose normal.     Mouth/Throat:     Pharynx: Oropharynx is clear.  Eyes:     Pupils: Pupils are equal, round, and reactive to light.  Cardiovascular:     Pulses: Normal pulses.  Pulmonary:     Effort: Pulmonary effort is normal.  Abdominal:      Palpations: Abdomen is soft.  Musculoskeletal:        General: Normal range of motion.  Skin:    General: Skin is dry.  Neurological:     Mental Status: He is oriented to person, place, and time.  Psychiatric:        Attention and Perception: He does not perceive auditory hallucinations.        Mood and Affect: Mood is depressed. Affect is flat.        Speech: He is noncommunicative.        Behavior: Behavior is withdrawn.        Thought  Content: Thought content is not paranoid. Thought content does not include homicidal or suicidal ideation. Thought content does not include homicidal or suicidal plan.        Cognition and Memory: Cognition normal.    Review of Systems  Constitutional:  Negative for fever.  HENT:  Negative for hearing loss and sore throat.   Respiratory:  Negative for cough.   Cardiovascular:  Negative for chest pain.  Gastrointestinal:  Negative for heartburn and nausea.  Genitourinary:  Negative for dysuria.  Musculoskeletal:  Negative for myalgias (right arm pain).  Skin:  Negative for rash.  Neurological: Negative.  Negative for tremors, weakness and headaches.  Psychiatric/Behavioral:  Positive for depression (denies SI, denies HI) and substance abuse (meth abuse). Negative for hallucinations (+AH of voices), memory loss and suicidal ideas. The patient is nervous/anxious (improving). The patient does not have insomnia (improving).   All other systems reviewed and are negative.  Blood pressure 126/80, pulse 89, temperature 98 F (36.7 C), temperature source Oral, resp. rate 14, height 6\' 1"  (1.854 m), weight 66.2 kg, SpO2 96 %. Body mass index is 19.26 kg/m.  Treatment Plan Summary: Daily contact with patient to assess and evaluate symptoms and progress in treatment, Medication management, and Plan :   PLAN Safety and Monitoring: Voluntary admission to inpatient psychiatric unit for safety, stabilization and treatment Daily contact with patient to assess  and evaluate symptoms and progress in treatment Patient's case to be discussed in multi-disciplinary team meeting Observation Level : q15 minute checks Vital signs: q12 hours Precautions: Safety   Long Term Goal(s): Improvement in symptoms so as ready for discharge   Short Term Goals: Ability to identify changes in lifestyle to reduce recurrence of condition will improve, Ability to disclose and discuss suicidal ideas, Ability to demonstrate self-control will improve, Ability to identify and develop effective coping behaviors will improve, Compliance with prescribed medications will improve, and Ability to identify triggers associated with substance abuse/mental health issues will improve.  Diagnoses:  Principal Problem:   Schizophrenia (HCC) Active Problems:   Tardive dyskinesia   Anxiety state   Insomnia   Essential hypertension, benign   Schizophrenia - Fluphenazine Dec 25 mg IM (11/02/21)     Continue:  - Prolixin 10 mg BID (started on 10/22 @ 2200) - Ingrezza 40 mg daily for EPS - Benadryl to 50 mg nightly for mild stiffness to b/l upper  extremities   Anxiety     - Continue: - Hydroxyzine 25 mg every 6 hours PRN   Insomnia     - Continue: - melatonin 3 mg nightly  - Trazodone to 100 mg nightly   Hypertension     -Continue: - Norvasc 5 mg daily   Other PRNS -Continue Ibuprofen 400 mg Q 6 H PRN for pain -Continue Tylenol 650 mg every 6 hours PRN for mild pain -Continue Maalox 30 mg every 4 hrs PRN for indigestion -Continue Milk of Magnesia as needed every 6 hrs for constipation   3. Discharge Planning: Social work and case management to assist with discharge planning and identification of hospital follow-up needs prior to discharge Estimated LOS: 5-7 days Discharge Concerns: Need to establish a safety plan; Medication compliance and effectiveness Discharge Goals: Return home with outpatient referrals for mental health follow-up including medication  management/psychotherapy.  11/22, NP 11/03/2021, 1:29 PMPatient ID: 11/05/2021, male   DOB: [birth year], 36 y.o.   MRN: [id number] Patient ID: Demarlo Riojas, male   DOB: January 17, 1985, 36 y.o.  Patient HT:DSKAJGO ID: Naim Murtha, male   DOB: 1985/12/11, 36 y.o.   MRN: [id number]

## 2021-11-03 NOTE — [facility] Counselor (Addendum)
CSW attempted to contact Annie Main [phone] ([facility], [place]) but received no response.  CSW left a HIPAA approved voicemail asking for Annie Main to return the call (11/03/2021 at 10:40am).    CSW faxed the Pt's information to both the [place] office (2nd fax this admission) and to the [place] office (where [facility] sent the referral during the Pt's last admission).    CSW spoke with the [facility] Team [phone], who states that since the Pt will be discharged to the [facility] in [place] then the referral should be left with [facility] at this time.  She states that "if he comes back to [place], then the [facility] ACT Team can transfer it to the [place] office at that time".  The Pt signed an ROI during the last admission to have his information sent to the [facility].  CSW attempted to speak with that ACT Team again during this admission but was told that they would need an updated referral to check to see what happened during the previous contact attempts.  CSW faxed this information a few days ago and has not received a response back.  The [place] office was also unable to provide the CSW with any additional detail about what occurred with follow-up between the prior and current admissions.

## 2021-11-03 NOTE — Progress Notes (Signed)
Pt in bed throughout shift. Group and meal attendance poor. Pt did take medications with several prompts from staff. Pt has not showered. Nurse encouraged pt to shower and change linens on bed. Pt reports no change in auditory hallucinations. Pt denies SI/HI/self harm thoughts.  Q 15 minute checks ongoing.

## 2021-11-04 DIAGNOSIS — F200 Paranoid schizophrenia: Secondary | ICD-10-CM | POA: Diagnosis not present

## 2021-11-04 MED ORDER — AMLODIPINE BESYLATE 5 MG PO TABS: 5.0000 mg | ORAL_TABLET | Freq: Every day | ORAL | 0 refills | Status: DC

## 2021-11-04 MED ORDER — TRAZODONE HCL 100 MG PO TABS: 100.0000 mg | ORAL_TABLET | Freq: Every day | ORAL | 0 refills | Status: DC

## 2021-11-04 MED ORDER — FLUPHENAZINE HCL 10 MG PO TABS: 5.0000 mg | ORAL_TABLET | Freq: Two times a day (BID) | ORAL | 0 refills | Status: DC

## 2021-11-04 MED ORDER — MELATONIN 3 MG PO TABS: 3.0000 mg | ORAL_TABLET | Freq: Every evening | ORAL | 0 refills | Status: DC | PRN

## 2021-11-04 MED ORDER — VALBENAZINE TOSYLATE 40 MG PO CAPS: 40.0000 mg | ORAL_CAPSULE | Freq: Every day | ORAL | 0 refills | Status: DC

## 2021-11-04 MED ORDER — DIPHENHYDRAMINE HCL 50 MG PO CAPS: ORAL_CAPSULE | ORAL | 0 refills | Status: DC

## 2021-11-04 MED ORDER — FLUPHENAZINE DECANOATE 25 MG/ML IJ SOLN: 25.0000 mg | INTRAMUSCULAR | 0 refills | Status: DC

## 2021-11-04 MED ORDER — PANTOPRAZOLE SODIUM 40 MG PO TBEC: 40.0000 mg | DELAYED_RELEASE_TABLET | Freq: Every day | ORAL | 0 refills | Status: DC

## 2021-11-04 MED ORDER — DOCUSATE SODIUM 100 MG PO CAPS: 100.0000 mg | ORAL_CAPSULE | Freq: Every day | ORAL | 0 refills | Status: DC

## 2021-11-04 NOTE — Progress Notes (Signed)
  [facility] Adult Case Management Discharge Plan :  Will you be returning to the same living situation after discharge:  No. [facility] in [place]  At discharge, do you have transportation home?: Yes,  Taxi  Do you have the ability to pay for your medications: Yes,  Medicaid   Release of information consent forms completed and in the chart;  Patient's signature needed at discharge.  Patient to Follow up at:  [facility].   Specialty: Behavioral Health Why: Please call or go to this provider to schedule an assessment, to obtain therapy and medication management services; on a Monday or a Wednesday morning, arrive by 7:30 am.  Services are provided on a first come, first served basis.  Once you have scheduled and completed your assessment, appointments will be scheduled for therapy and medication management. Contact information: [place] [phone]        Services, [facility]. Go on 11/07/2021.   Why: You have a hospital follow up appointment to obtain therapy and medication management services on 11/07/21 at 10:00 am.      Please inquire about ACTT services as well. Contact information: [place] [address] [phone]         [facility] Follow up.   Why: Please contact this agency to inquire about ACTT services. Contact information: [place] [address] [phone]                 Next level of care provider has access to [place] and Suicide Prevention discussed: Yes,  with patient      Has patient been referred to the Quitline?: N/A patient is not a smoker  Patient has been referred for addiction treatment: [place], [place] 11/04/2021, 9:11 AM

## 2021-11-04 NOTE — Plan of Care (Signed)
Care plans complete. Patient discharged to home/self care.

## 2021-11-04 NOTE — Progress Notes (Signed)
Patient ID: Wesley Peterson, male   DOB: Aug 14, 1985, 36 y.o.   MRN: [id number] Patient discharged to home/self care. Patient had a bright affect upon discharge. Patient denies SI, HI and AVH. Patient acknowledged understanding of all discharge instructions and receipt of personal belongings upon discharge.

## 2021-11-04 NOTE — Discharge Summary (Signed)
Physician Discharge Summary Note  Patient:  Wesley Peterson is an 36 y.o., male MRN:  [id number] DOB:  04-17-1985 Patient phone:  [phone] (home)  Patient address:   [address] [place] [address],  Total Time spent with patient: 30 minutes  Date of Admission:  10/25/2021 Date of Discharge: 11/04/2021  Reason for Admission:   Wesley Peterson is a 36 yo [place] American male with a prior mental health history of schizophrenia who presented to the [facility] with worsening psychosis and confusion in the context of medication noncompliance and methamphetamine abuse.  Patient was involuntarily committed and transferred to this [facility] for treatment and stabilization of his mental status.   Principal Problem: Schizoaffective disorder, depressive type [facility]) Discharge Diagnoses: Principal Problem:   Schizoaffective disorder, depressive type ([place]) Active Problems:   Schizophrenia, paranoid ([place])   Schizophrenia ([place])   Tardive dyskinesia   Anxiety state   Insomnia   Essential hypertension, benign  Past Psychiatric History: As above  Past Medical History:  Past Medical History:  Diagnosis Date   Schizophrenia ([place])    Schizophrenia, paranoid type ([place]) 02/02/2021   History reviewed. No pertinent surgical history. Family History: History reviewed. No pertinent family history. Family Psychiatric  History: none provided Social History:  Social History   Substance and Sexual Activity  Alcohol Use Not Currently   Alcohol/week: 1.0 standard drink of alcohol   Types: 1 Cans of beer per week   Comment: occasional     Social History   Substance and Sexual Activity  Drug Use Never    Social History   Socioeconomic History   Marital status: Single    Spouse name: Not on file   Number of children: 1   Years of education: Not on file   Highest education level: Not on file  Occupational History   Occupation: Unemployed  Tobacco Use   Smoking  status: Never    Passive exposure: Never   Smokeless tobacco: Current   Tobacco comments:    Vaping nicotine   Vaping Use   Vaping Use: Not on file  Substance and Sexual Activity   Alcohol use: Not Currently    Alcohol/week: 1.0 standard drink of alcohol    Types: 1 Cans of beer per week    Comment: occasional   Drug use: Never   Sexual activity: Never  Other Topics Concern   Not on file  Social History Narrative   ** Merged History Encounter **       Pt lives with mother and other relatives.  Currently unemployed.  [place] outpatient psychiatry services through [facility]   Social Determinants of Health   Financial Resource Strain: Not on file  Food Insecurity: Food Insecurity Present (10/25/2021)   Hunger Vital Sign    Worried About Running Out of Food in the Last Year: Often true    Ran Out of Food in the Last Year: Often true  Transportation Needs: Unmet Transportation Needs (10/25/2021)   PRAPARE - Hydrologist (Medical): Yes    Lack of Transportation (Non-Medical): Yes  Physical Activity: Not on file  Stress: Not on file  Social Connections: Not on file                                         [place] During the patient's hospitalization, patient had extensive initial  psychiatric evaluation, and follow-up psychiatric evaluations every day. Psychiatric diagnoses provided upon initial assessment are: Principal Problem:   Schizoaffective disorder, depressive type (HCC) Active Problems:   Schizophrenia, paranoid (HCC)   Schizophrenia (HCC)   Tardive dyskinesia   Anxiety state   Insomnia   Essential hypertension, benign   Patient's psychiatric medications were adjusted on admission as follows: Schizophrenia -Discontinue Geodon d/t lack of efficacy -Start Invega 3 mg every morning starting 10/19 at 0800 -Start Invega 6 mg nightly starting tonight 10/18 @ 2200 for psychosis with plan to give LAI prior to discharge -Continue  Ingrezza 40 mg daily for EPS   Anxiety -Continue Hydroxyzine 25 mg every 6 hours PRN   Insomnia -Start melatonin 3 mg nightly  -Start Trazodone 50 mg nightly PRN   Hypertension -Continue Norvasc 5 mg daily   During the hospitalization, other adjustments were made to the patient's psychiatric medication regimen as follows:   -Given Fluphenazine Dec 25 mg IM on (11/02/21) -Continue Fluphenazine Dec 25 mg Q 28 days-Next due on 11/30/2021 -Continue Prolixin 5 mg BID until next LAI on 11/30/2021 -Continue Ingrezza 40 mg daily for EPS -Continue Benadryl to 50 mg nightly for mild stiffness to R arm -Continue melatonin 3 mg nightly  -Continue Trazodone to 100 mg nightly -Continue Norvasc 5 mg daily -Continue Protonix 40 mg daily  -Discontinued Geodon at admission d/t lack of efficacy -Previously discontinued Invega d/t lack of efficacy (on 10/22). Has also failed Zyprexa, in the past   Patient's care was discussed during the interdisciplinary team meeting every day during the hospitalization. The patient denies having side effects to prescribed psychiatric medication. Gradually, patient started adjusting to milieu. The patient was evaluated each day by a clinical provider to ascertain response to treatment. Improvement was noted by the patient's report of decreasing symptoms, improved sleep and appetite, affect, medication tolerance, behavior, and participation in unit programming.  Patient was asked each day to complete a self inventory noting mood, mental status, pain, new symptoms, anxiety and concerns.     Symptoms were reported as significantly decreased by discharge. On day of discharge, the patient reports that their mood is stable. The patient denied having suicidal thoughts for more than 48 hours prior to discharge.  Patient denies having homicidal thoughts.  Patient reports that auditory hallucinations have reduced in intensity.  Patient denies any visual hallucinations or other  symptoms of psychosis. The patient is motivated to continue taking medication with a goal of continued improvement in mental health.    The patient reports their target psychiatric symptoms of depression, insomnia and auditory hallucinations responded well to the psychiatric medications, and the patient reports overall benefit other psychiatric hospitalization. Supportive psychotherapy was provided to the patient. The patient also participated in some unit group therapy while hospitalized. Coping skills, problem solving as well as relaxation therapies were also part of the unit programming.   Labs were reviewed with the patient, and abnormal results were discussed with the patient.  Physical Findings: AIMS: Facial and Oral Movements Muscles of Facial Expression: None, normal Lips and Perioral Area: None, normal Jaw: None, normal Tongue: None, normal,Extremity Movements Upper (arms, wrists, hands, fingers): None, normal Lower (legs, knees, ankles, toes): None, normal, Trunk Movements Neck, shoulders, hips: None, normal, Overall Severity Severity of abnormal movements (highest score from questions above): None, normal Incapacitation due to abnormal movements: None, normal Patient's awareness of abnormal movements (rate only patient's report): No Awareness, Dental Status Current problems with teeth and/or dentures?: No Does patient  usually wear dentures?: No  CIWA:  n/a COWS: n/a AIMS: 0    Musculoskeletal: Strength & Muscle Tone: within normal limits Gait & Station: normal Patient leans: N/A  Psychiatric Specialty Exam:  Presentation  General Appearance:  Fairly Groomed  Eye Contact: Fair  Speech: Clear and Coherent  Speech Volume: Normal  Handedness: Right   Mood and Affect  Mood: Depressed (mood has improved as compared to prior to admission)  Affect: Flat; Congruent   Thought Process  Thought Processes: Coherent  Descriptions of  Associations:Intact  Orientation:Full (Time, Place and Person)  Thought Content:Logical  History of Schizophrenia/Schizoaffective disorder:Yes  Duration of Psychotic Symptoms:Greater than six months  Hallucinations:Hallucinations: Auditory Description of Auditory Hallucinations: unable to make out what the voices are saying, but states that they have reduced in intensity as compared to at admission  Ideas of Reference:None  Suicidal Thoughts:Suicidal Thoughts: No  Homicidal Thoughts:Homicidal Thoughts: No   Sensorium  Memory: Immediate Good  Judgment: Fair  Insight: Fair   Art therapistxecutive Functions  Concentration: Good  Attention Span: Good  Recall: Good  Fund of Knowledge: Good  Language: Good   Psychomotor Activity  Psychomotor Activity: Psychomotor Activity: Other (comment) (mild stiffness to RUE)   Assets  Assets: Resilience   Sleep  Sleep: Sleep: Good Number of Hours of Sleep: 7.25    Physical Exam: Physical Exam Constitutional:      Appearance: Normal appearance.  HENT:     Head: Normocephalic.     Nose: Nose normal. No congestion or rhinorrhea.  Eyes:     Pupils: Pupils are equal, round, and reactive to light.  Pulmonary:     Effort: Pulmonary effort is normal.  Musculoskeletal:        General: Normal range of motion.     Cervical back: Normal range of motion.  Neurological:     General: No focal deficit present.     Mental Status: He is alert and oriented to person, place, and time.    Review of Systems  Constitutional:  Negative for fever.  HENT:  Negative for hearing loss.   Eyes: Negative.   Respiratory: Negative.  Negative for cough.   Cardiovascular: Negative.  Negative for chest pain.  Gastrointestinal: Negative.   Genitourinary: Negative.   Musculoskeletal: Negative.   Skin: Negative.  Negative for rash.  Neurological:  Negative for dizziness.  Psychiatric/Behavioral:  Positive for depression (Denies SI, denies  HI, denies AVH, verbally contracts [facility]), hallucinations (reports significant reductions in AH since admission) and substance abuse (educated on the need to abstain from substances of abuse). Negative for memory loss and suicidal ideas. The patient is not nervous/anxious and does not have insomnia.    Blood pressure 114/79, pulse 94, temperature 98.4 F (36.9 C), temperature source Oral, resp. rate 14, height 6\' 1"  (1.854 m), weight 66.2 kg, SpO2 98 %. Body mass index is 19.26 kg/m.   Social History   Tobacco Use  Smoking Status Never   Passive exposure: Never  Smokeless Tobacco Current  Tobacco Comments   Vaping nicotine    Tobacco Cessation:  N/A, patient does not currently use tobacco products   Blood Alcohol level:  Lab Results  Component Value Date   ETH <10 10/25/2021   ETH <10 08/31/2021    Metabolic Disorder Labs:  Lab Results  Component Value Date   HGBA1C 5.7 (H) 10/27/2021   MPG 116.89 10/27/2021   MPG 122.63 06/02/2021   Lab Results  Component Value Date  PROLACTIN 19.9 (H) 09/06/2021   PROLACTIN 17.5 (H) 06/02/2021   Lab Results  Component Value Date   CHOL 137 06/02/2021   TRIG 105 06/02/2021   HDL 36 (L) 06/02/2021   CHOLHDL 3.8 06/02/2021   VLDL 21 06/02/2021   LDLCALC 80 06/02/2021   LDLCALC 80 01/30/2021    See Psychiatric Specialty Exam and Suicide Risk Assessment completed by Attending Physician prior to discharge.  Discharge destination:  Home  Is patient on multiple antipsychotic therapies at discharge:  No   Has Patient had three or more failed trials of antipsychotic monotherapy by history:  No  Recommended Plan for Multiple Antipsychotic Therapies: NA   Allergies as of 11/04/2021   No Known Allergies      Medication List     STOP taking these medications    ziprasidone 60 MG capsule Commonly known as: GEODON       TAKE these medications      Indication  amLODipine 5 MG tablet Commonly known  as: NORVASC Take 1 tablet (5 mg total) by mouth daily.  Indication: High Blood Pressure Disorder   diphenhydrAMINE 50 MG capsule Commonly known as: BENADRYL For stiffness to right arm  Indication: stiffness to L arm   docusate sodium 100 MG capsule Commonly known as: COLACE Take 1 capsule (100 mg total) by mouth daily. Start taking on: November 05, 2021  Indication: Constipation   fluPHENAZine 10 MG tablet Commonly known as: PROLIXIN Take 0.5 tablets (5 mg total) by mouth in the morning and at bedtime.  Indication: Psychosis, Schizophrenia   fluPHENAZine decanoate 25 MG/ML injection Commonly known as: PROLIXIN Inject 1 mL (25 mg total) into the muscle every 28 (twenty-eight) days. Start taking on: November 27, 2021  Indication: Psychosis, Due on 11/27/21   melatonin 3 MG Tabs tablet Take 1 tablet (3 mg total) by mouth at bedtime as needed.  Indication: Trouble Sleeping   pantoprazole 40 MG tablet Commonly known as: PROTONIX Take 1 tablet (40 mg total) by mouth daily.  Indication: Gastroesophageal Reflux Disease   traZODone 100 MG tablet Commonly known as: DESYREL Take 1 tablet (100 mg total) by mouth at bedtime.  Indication: Trouble Sleeping   valbenazine 40 MG capsule Commonly known as: INGREZZA Take 1 capsule (40 mg total) by mouth daily.  Indication: Tardive Dyskinesia        Follow-up Information     [facility] Follow up.   Specialty: Behavioral Health Why: Please call or go to this provider to schedule an assessment, to obtain therapy and medication management services; on a Monday or a Wednesday morning, arrive by 7:30 am.  Services are provided on a first come, first served basis.  Once you have scheduled and completed your assessment, appointments will be scheduled for therapy and medication management. Contact information: [address] [place] [address] [phone]        Services, [facility]. Go on  11/07/2021.   Why: You have a hospital follow up appointment to obtain therapy and medication management services on 11/07/21 at 10:00 am.      Please inquire about ACTT services as well. Contact information: [address] [place] [address] [phone]         [facility] Follow up.   Why: Please contact this agency to inquire about ACTT services. Contact information: [address] [place] [address] [phone]  Follow-up recommendations:   The patient is able to verbalize their individual safety plan to this provider.   # It is recommended to the patient to continue psychiatric medications as prescribed, after discharge from the hospital.     # It is recommended to the patient to follow up with your outpatient psychiatric provider and PCP.   # It was discussed with the patient, the impact of alcohol, drugs, tobacco have been there overall psychiatric and medical wellbeing, and total abstinence from substance use was recommended the patient.ed.   # Prescriptions provided or sent directly to preferred pharmacy at discharge. Patient agreeable to plan. Given opportunity to ask questions. Appears to feel comfortable with discharge.    # In the event of worsening symptoms, the patient is instructed to call the crisis hotline, 911 and or go to the nearest ED for appropriate evaluation and treatment of symptoms. To follow-up with primary care provider for other medical issues, concerns and or health care needs   # Patient was discharged to the [facility] [place] with a plan to follow up as noted above.    Signed: Starleen Blue, NP 11/04/2021, 5:40 PM

## 2021-11-04 NOTE — [facility] Suicide Risk Assessment (Addendum)
Suicide Risk Assessment  Discharge Assessment    Wes[facility] Discharge Suicide Risk Assessment   Principal Problem: Schizoaffective disorder, depressive type ([name]) Discharge Diagnoses: Principal Problem:   Schizoaffective disorder, depressive type ([name]) Active Problems:   Schizophrenia, paranoid ([place])   Schizophrenia ([place])   Tardive dyskinesia   Anxiety state   Insomnia   Essential hypertension, benign  Reason For Admission: Wesley [facility] is a 36 yo [place] American male with a prior mental health history of schizophrenia who presented to the [facility] with worsening psychosis and confusion in the context of medication noncompliance and methamphetamine abuse.  Patient was involuntarily committed and transferred to this [facility] for treatment and stabilization of his mental status.                                          [facility] COURSE During the patient's hospitalization, patient had extensive initial psychiatric evaluation, and follow-up psychiatric evaluations every day. Psychiatric diagnoses provided upon initial assessment are: Principal Problem:   Schizoaffective disorder, depressive type ([place]) Active Problems:   Schizophrenia, paranoid ([place])   Schizophrenia ([place])   Tardive dyskinesia   Anxiety state   Insomnia   Essential hypertension, benign  Patient's psychiatric medications were adjusted on admission as follows: Schizophrenia -Discontinue Geodon d/t lack of efficacy -Start Invega 3 mg every morning starting 10/19 at 0800 -Start Invega 6 mg nightly starting tonight 10/18 @ 2200 for psychosis with plan to give LAI prior to discharge -Continue Ingrezza 40 mg daily for EPS   Anxiety -Continue Hydroxyzine 25 mg every 6 hours PRN   Insomnia -Start melatonin 3 mg nightly  -Start Trazodone 50 mg nightly PRN   Hypertension -Continue Norvasc 5 mg daily  During the hospitalization, other adjustments were made to the patient's psychiatric  medication regimen as follows:  -Given Fluphenazine Dec 25 mg IM on (11/02/21) -Continue Fluphenazine Dec 25 mg Q 28 days-Next due on 11/30/2021 -Continue Prolixin 5 mg BID until next LAI on 11/30/2021 -Continue Ingrezza 40 mg daily for EPS -Continue Benadryl to 50 mg nightly for mild stiffness to R arm -Continue melatonin 3 mg nightly  -Continue Trazodone to 100 mg nightly -Continue Norvasc 5 mg daily -Continue Protonix 40 mg daily  -Discontinued Geodon at admission d/t lack of efficacy -Previously discontinued Invega d/t lack of efficacy (on 10/22). Has also failed Zyprexa, in the past  Patient's care was discussed during the interdisciplinary team meeting every day during the hospitalization. The patient denies having side effects to prescribed psychiatric medication. Gradually, patient started adjusting to milieu. The patient was evaluated each day by a clinical provider to ascertain response to treatment. Improvement was noted by the patient's report of decreasing symptoms, improved sleep and appetite, affect, medication tolerance, behavior, and participation in unit programming.  Patient was asked each day to complete a self inventory noting mood, mental status, pain, new symptoms, anxiety and concerns.    Symptoms were reported as significantly decreased by discharge. On day of discharge, the patient reports that their mood is stable. The patient denied having suicidal thoughts for more than 48 hours prior to discharge.  Patient denies having homicidal thoughts.  Patient reports that auditory hallucinations have reduced in intensity.  Patient denies any visual hallucinations or other symptoms of psychosis. The patient is motivated to continue taking medication with a goal of continued improvement in mental health.  The patient reports their target psychiatric symptoms of depression, insomnia and auditory hallucinations responded well to the psychiatric medications, and the patient reports  overall benefit other psychiatric hospitalization. Supportive psychotherapy was provided to the patient. The patient also participated in some unit group therapy while hospitalized. Coping skills, problem solving as well as relaxation therapies were also part of the unit programming.  Labs were reviewed with the patient, and abnormal results were discussed with the patient.  Total Time spent with patient: 30 minutes  Musculoskeletal: Strength & Muscle Tone: within normal limits Gait & Station: normal Patient leans: N/A  Psychiatric Specialty Exam  Presentation  General Appearance:  Fairly Groomed  Eye Contact: Fair  Speech: Clear and Coherent  Speech Volume: Normal  Handedness: Right   Mood and Affect  Mood: Depressed (mood has improved as compared to prior to admission)  Duration of Depression Symptoms: Greater than two weeks  Affect: Flat; Congruent   Thought Process  Thought Processes: Coherent  Descriptions of Associations:Intact  Orientation:Full (Time, Place and Person)  Thought Content:Logical  History of Schizophrenia/Schizoaffective disorder:Yes  Duration of Psychotic Symptoms:Greater than six months  Hallucinations:Hallucinations: Auditory Description of Auditory Hallucinations: unable to make out what the voices are saying, but states that they have reduced in intensity as compared to at admission  Ideas of Reference:None  Suicidal Thoughts:Suicidal Thoughts: No  Homicidal Thoughts:Homicidal Thoughts: No   Sensorium  Memory: Immediate Good  Judgment: Fair  Insight: Fair   Community education officer  Concentration: Good  Attention Span: Good  Recall: Good  Fund of Knowledge: Good  Language: Good   Psychomotor Activity  Psychomotor Activity: Psychomotor Activity: Other (comment) (mild stiffness to RUE)   Assets  Assets: Resilience   Sleep  Sleep: Sleep: Good Number of Hours of Sleep: 7.25   Physical  Exam: Physical Exam Constitutional:      Appearance: Normal appearance.  HENT:     Head: Normocephalic.     Nose: Nose normal. No congestion or rhinorrhea.  Eyes:     Pupils: Pupils are equal, round, and reactive to light.  Pulmonary:     Effort: Pulmonary effort is normal.  Musculoskeletal:        General: Normal range of motion.     Cervical back: Rigidity (mild stiffness to RUE, being discharged on Benadryl for this) present.  Neurological:     Mental Status: He is alert and oriented to person, place, and time.     Sensory: No sensory deficit.    Review of Systems  Constitutional:  Negative for fever.  HENT: Negative.    Eyes: Negative.   Respiratory:  Negative for cough.   Cardiovascular: Negative.  Negative for chest pain.  Gastrointestinal: Negative.   Genitourinary: Negative.   Skin: Negative.   Psychiatric/Behavioral:  Positive for depression (denies SI, denies HI, contracts for safety outside of this [facility]) and hallucinations (states voices have decreased in intensity). Negative for memory loss. The patient is not nervous/anxious and does not have insomnia.    Blood pressure 114/79, pulse 94, temperature 98.4 F (36.9 C), temperature source Oral, resp. rate 14, height 6\' 1"  (1.854 m), weight 66.2 kg, SpO2 98 %. Body mass index is 19.26 kg/m.  Mental Status Per Nursing Assessment::   On Admission:  NA  Demographic Factors:  Male and Low socioeconomic status  Loss Factors: Financial problems/change in socioeconomic status  Historical Factors: NA  Risk Reduction Factors:   Positive social support-Father is supportive  Continued Clinical Symptoms:  Schizophrenia:  Paranoid or undifferentiated type-Patient's psychosis is most likely fixed at this point. He reports that he is continuing to have auditory hallucinations, but of a lesser intensity than prior to admission.  Cognitive Features That Contribute To Risk:  None    Suicide Risk:  Mild:  There are  no identifiable suicide plans, no associated intent, mild dysphoria and related symptoms, good self-control (both objective and subjective assessment), few other risk factors, and identifiable protective factors, including available and accessible social support.    [facility] Follow up.   Specialty: Behavioral Health Why: Please call or go to this provider to schedule an assessment, to obtain therapy and medication management services; on a Monday or a Wednesday morning, arrive by 7:30 am.  Services are provided on a first come, first served basis.  Once you have scheduled and completed your assessment, appointments will be scheduled for therapy and medication management. Contact information: [place] [phone]        Services, [facility]. Go on 11/07/2021.   Why: You have a [facility] follow up appointment to obtain therapy and medication management services on 11/07/21 at 10:00 am.      Please inquire about ACTT services as well. Contact information: [place] [address] [phone]         [facility] Follow up.   Why: Please contact this agency to inquire about ACTT services. Contact information: [place] [address] [phone]                Plan Of Care/Follow-up recommendations:  The patient is able to verbalize their individual safety plan to this provider.  # It is recommended to the patient to continue psychiatric medications as prescribed, after discharge from the [facility].    # It is recommended to the patient to follow up with your outpatient psychiatric provider and PCP.  # It was discussed with the patient, the impact of alcohol, drugs, tobacco have been there overall psychiatric and medical wellbeing, and total abstinence from substance use was recommended the patient.ed.  # Prescriptions  provided or sent directly to preferred pharmacy at discharge. Patient agreeable to plan. Given opportunity to ask questions. Appears to feel comfortable with discharge.    # In the event of worsening symptoms, the patient is instructed to call the crisis hotline, 911 and or go to the nearest ED for appropriate evaluation and treatment of symptoms. To follow-up with primary care provider for other medical issues, concerns and or health care needs  # Patient was discharged to the [facility] with a plan to follow up as noted above.   Nicholes Rough, NP 11/04/2021, 10:31 AM

## 2021-11-04 NOTE — Progress Notes (Signed)
   11/04/21 0515  Sleep  Number of Hours 6.5

## 2021-11-23 ENCOUNTER — Encounter (HOSPITAL_COMMUNITY): Payer: Self-pay

## 2021-11-23 ENCOUNTER — Other Ambulatory Visit (HOSPITAL_COMMUNITY): Payer: Self-pay | Admitting: Student in an Organized Health Care Education/Training Program

## 2021-11-23 ENCOUNTER — Ambulatory Visit (HOSPITAL_COMMUNITY): Payer: Medicaid Other

## 2021-11-23 VITALS — BP 141/95 | HR 70 | Ht 73.0 in | Wt 151.0 lb

## 2021-11-23 DIAGNOSIS — F250 Schizoaffective disorder, bipolar type: Secondary | ICD-10-CM

## 2021-11-23 DIAGNOSIS — F200 Paranoid schizophrenia: Secondary | ICD-10-CM

## 2021-11-23 DIAGNOSIS — G2401 Drug induced subacute dyskinesia: Secondary | ICD-10-CM

## 2021-11-23 MED ORDER — FLUPHENAZINE DECANOATE 25 MG/ML IJ SOLN: 25.0000 mg | INTRAMUSCULAR | 0 refills | Status: DC

## 2021-11-23 MED ORDER — FLUPHENAZINE DECANOATE 25 MG/ML IJ SOLN
25.0000 mg | Freq: Once | INTRAMUSCULAR | Status: AC
  Administered 2021-11-23: 25 mg via INTRAMUSCULAR

## 2021-11-23 MED ORDER — FLUPHENAZINE DECANOATE 25 MG/ML IJ SOLN: 25.0000 mg | INTRAMUSCULAR | Status: DC

## 2021-11-23 NOTE — Progress Notes (Signed)
Sending Prolixin prescription to different pharmacy as patient no longer lives in [place]. Sending to [facility].   Sent: -Prolixin 25 mg q28 days.  5 ml bottle with 0 refills.   Arna Snipe MD Resident

## 2021-11-23 NOTE — Progress Notes (Signed)
Patient presented for Prolixin injection and to establish care.  He will be given his injection and then scheduled for follow up to establish care.     Prolixin LAI 25 mg q28 days.    Arna Snipe MD Resident

## 2021-11-23 NOTE — Progress Notes (Addendum)
PATIENT PRESENTS TO THE OFFICE FOR FLUPHENAZINE DECANOATE (PROLIXIN) INJECTION 25 mg  , HE TOLERATED THE INJECTION WELL AND WILL RETURN IN 28 DAYS   INJECTION WAS GIVEN BY Aerabella Galasso MA IN THE RIGHT DELTOID

## 2021-12-05 ENCOUNTER — Ambulatory Visit (INDEPENDENT_AMBULATORY_CARE_PROVIDER_SITE_OTHER): Payer: Medicaid Other | Admitting: Psychiatry

## 2021-12-05 VITALS — BP 146/102 | HR 83

## 2021-12-05 DIAGNOSIS — F250 Schizoaffective disorder, bipolar type: Secondary | ICD-10-CM | POA: Diagnosis not present

## 2021-12-05 DIAGNOSIS — G2401 Drug induced subacute dyskinesia: Secondary | ICD-10-CM | POA: Diagnosis not present

## 2021-12-05 DIAGNOSIS — R41840 Attention and concentration deficit: Secondary | ICD-10-CM

## 2021-12-05 NOTE — Progress Notes (Signed)
Psychiatric Initial Adult Assessment   Patient Identification: Wesley Peterson MRN:  [id number] Date of Evaluation:  12/05/2021 Referral Source: Walk in Chief Complaint:  No chief complaint on file.  Visit Diagnosis:    ICD-10-CM   1. Schizoaffective disorder, bipolar type ([place])  F25.0     2. Tardive dyskinesia  G24.01     3. Attention deficit  R41.840 Ambulatory referral to Behavioral Health      History of Present Illness: Patient is a 36 year old male with past psychiatric history of schizoaffective disorder bipolar type, paranoid schizophrenia, tardive dyskinesia, and anxiety and medical history of hypertension presented to [facility] as a walk-in for ADHD medication management.  Patient was referred by his PCP for management of ADHD.  Patient states he is here to get refill for his ADHD medication.  He has been on Adderall 5 mg twice daily for a long time which had been prescribed by his PCP.  Patient states his PCP referred him to psychiatry for ADHD medication management.  Patient states he was diagnosed with ADHD at the age of 65.  He reports that at that time he was not able to sit still and had bad grades.  He reports that now without his Adderall he won't be able to concentrate, won't talk much and won't be able to sit still.  He reports that he was getting his Adderall previously from his PCP in [place] but he recently moved to [place] and got Aderall prescription twice from PCP in [place]. For his schizophrenia he is on Prolixin LAI 25 mg/mL every 28 days (most recent injection on 11/23/2021), and trazodone 100 mg daily at bedtime. He was given 7 days samples for ingrezza 40 mg for TD's at discharge but he was unable to continue it.   He denies depressed mood, changes in appetite and sleep, anhedonia, fatigue,  low energy, hopelessness, helplessness, and worthlessness.  He denies any manic symptoms or episode including pressured speech, decreased need for sleep, hypersexuality,  increased spending, racing thoughts, flight of ideas and grandiosity.  He denies feeling irritable, and angry.    Currently, He denies active or passive Suicidal ideations, Homicidal ideations,.  He reports off and on auditory hallucinations but denies any command hallucinations.  He reports that he was thinking that the voices were in his head but now he hears voices from outside.  He denies visual hallucinations. He reports some paranoia that people are after him. He denies any history of physical, verbal, and sexual abuse. He reports some anxiety but no panic attacks.  Past Psychiatric Hx:  Previous Psych Diagnoses: Paranoid schizophrenia, schizoaffective disorder bipolar type Prior inpatient treatment: Patient states he was admitted about 6 times.  Most recent admission at [facility] from 10/25/2021 to 11/04/2021 for psychosis.  Current meds: Prolixin LAI 25 mg/mL every 28 days (most recent injection on 11/23/2021), Ingrezza 40 mg daily, trazodone 100 mg daily at bedtime and melatonin 3 mg tabs nightly as needed for sleep Psychotherapy hx: Denies Previous suicidal attempts: Denies Previous medication trials: States that he was on risperidone, Zyprexa, Cogentin, Geodon in the past Current therapist: None  Substance Abuse Hx: Alcohol: Socially Tobacco:Denies Illicit drugs-Denies Rehab [id number] Seizures, DUI's, DT's- Denies  Past Medical History: Medical Diagnoses: Hypertension Home Rx: Amlodipine 5 mg daily Allergies:Denies PCP: Nancie Neas  Family Psych History: Psych denies SA/HA: Denies  Social History: Marital Status: Single Children: 2 kids (12, 8) Employment: Unemployed Housing: Lives in a hotel alone Guns: Denies Legal: Denies   Associated Signs/Symptoms:  Depression Symptoms:  difficulty concentrating, anxiety, (Hypo) Manic Symptoms:  Distractibility, Hallucinations, Anxiety Symptoms:  Excessive Worry, Psychotic Symptoms:  Hallucinations: Auditory Paranoia, PTSD  Symptoms: NA  Past Psychiatric History: From recent admission: Pt is able to collaborate his past diagnosis of schizophrenia, but is not able to state past hospitalizations. As per chart review, pt was hospitalized at this [facility] on 12/04/2018 with acute psychosis. He was again hospitalized at this [facility] on 02/02/2021 after his father called the police dept due to worsening of pt's psychosis. Most recent hospitalizations were on 06/01/2021 for worsening auditory hallucinations and passive SI. Last hospitalization at this [facility] prior to this one was from 09/03/21 through 09/15/21 for auditory hallucinations. Pt has several other ER visits for SI and psychosis.  Most recent admission at [facility] was from  10/25/21- 11/04/21 for worsening psychosis in the context of medication noncompliance and methamphetamine abuse.  Previous Psychotropic Medications: Yes   Substance Abuse History in the last 12 months:  No.  Consequences of Substance Abuse: NA  Past Medical History:  Past Medical History:  Diagnosis Date   Schizophrenia (HCC)    Schizophrenia, paranoid type (HCC) 02/02/2021   No past surgical history on file.  Family Psychiatric History: denies  Family History: No family history on file.  Social History:   Social History   Socioeconomic History   Marital status: Single    Spouse name: Not on file   Number of children: 1   Years of education: Not on file   Highest education level: Not on file  Occupational History   Occupation: Unemployed  Tobacco Use   Smoking status: Never    Passive exposure: Never   Smokeless tobacco: Current   Tobacco comments:    Vaping nicotine   Vaping Use   Vaping Use: Not on file  Substance and Sexual Activity   Alcohol use: Not Currently    Alcohol/week: 1.0 standard drink of alcohol    Types: 1 Cans of beer per week    Comment: occasional   Drug use: Never   Sexual activity: Never  Other Topics Concern   Not on file  Social History Narrative   **  Merged History Encounter **       Pt lives with mother and other relatives.  Currently unemployed.  Receives outpatient psychiatry services through [facility].   Social Determinants of Health   Financial Resource Strain: Not on file  Food Insecurity: Food Insecurity Present (10/25/2021)   Hunger Vital Sign    Worried About Running Out of Food in the Last Year: Often true    Ran Out of Food in the Last Year: Often true  Transportation Needs: Unmet Transportation Needs (10/25/2021)   PRAPARE - Administrator, Civil Service (Medical): Yes    Lack of Transportation (Non-Medical): Yes  Physical Activity: Not on file  Stress: Not on file  Social Connections: Not on file    Additional Social History: see HPI  Allergies:  No Known Allergies  Metabolic Disorder Labs: Lab Results  Component Value Date   HGBA1C 5.7 (H) 10/27/2021   MPG 116.89 10/27/2021   MPG 122.63 06/02/2021   Lab Results  Component Value Date   PROLACTIN 19.9 (H) 09/06/2021   PROLACTIN 17.5 (H) 06/02/2021   Lab Results  Component Value Date   CHOL 137 06/02/2021   TRIG 105 06/02/2021   HDL 36 (L) 06/02/2021   CHOLHDL 3.8 06/02/2021   VLDL 21 06/02/2021   LDLCALC 80 06/02/2021  [place] 80 01/30/2021   Lab Results  Component Value Date   TSH 2.661 09/06/2021    Therapeutic Level Labs: No results found for: "LITHIUM" No results found for: "CBMZ" No results found for: "VALPROATE"  Current Medications: Current Outpatient Medications  Medication Sig Dispense Refill   amLODipine (NORVASC) 5 MG tablet Take 1 tablet (5 mg total) by mouth daily. 30 tablet 0   diphenhydrAMINE (BENADRYL) 50 MG capsule For stiffness to right arm 30 capsule 0   docusate sodium (COLACE) 100 MG capsule Take 1 capsule (100 mg total) by mouth daily. 30 capsule 0   fluPHENAZine (PROLIXIN) 10 MG tablet Take 0.5 tablets (5 mg total) by mouth in the morning and at bedtime. 60 tablet 0   fluPHENAZine decanoate (PROLIXIN) 25  MG/ML injection Inject 1 mL (25 mg total) into the muscle every 28 (twenty-eight) days. 1 mL 0   melatonin 3 MG TABS tablet Take 1 tablet (3 mg total) by mouth at bedtime as needed. 30 tablet 0   pantoprazole (PROTONIX) 40 MG tablet Take 1 tablet (40 mg total) by mouth daily. 30 tablet 0   traZODone (DESYREL) 100 MG tablet Take 1 tablet (100 mg total) by mouth at bedtime. 30 tablet 0   valbenazine (INGREZZA) 40 MG capsule Take 1 capsule (40 mg total) by mouth daily. 30 capsule 0   No current facility-administered medications for this visit.    Musculoskeletal: Strength & Muscle Tone: within normal limits Gait & Station: normal Patient leans: N/A  Psychiatric Specialty Exam: Review of Systems  Blood pressure (!) 146/102, pulse 83, SpO2 100 %.There is no height or weight on file to calculate BMI.  General Appearance: Casual  Eye Contact:  Good  Speech:  Clear and Coherent and Normal Rate  Volume:  Normal  Mood:  Anxious  Affect:  Flat and Smiling inappropriately    Thought Process:  Coherent, Linear, and Descriptions of Associations: Intact  Orientation:  Full (Time, Place, and Person)  Thought Content:  Hallucinations: Auditory and Paranoid Ideation  Suicidal Thoughts:  No  Homicidal Thoughts:  No  Memory:  Immediate;   Good Recent;   Fair  Judgement:  Fair  Insight:  Shallow  Psychomotor Activity:  TD  Concentration:  Concentration: Fair and Attention Span: Fair  Recall:  [employer] of Knowledge:Good  Language: Good  Akathisia:  No  Handed:  Right  AIMS (if indicated):  not done  Assets:  Communication Skills Desire for Improvement Resilience  ADL's:  Intact  Cognition: WNL  Sleep:  Good   Screenings: AIMS    Flowsheet Row Admission (Discharged) from 10/25/2021 in [place] [facility] Admission (Discharged) from 09/03/2021 in [place] [facility] Admission (Discharged) from 06/01/2021 in [place] [facility] Admission (Discharged) from 02/02/2021 in [place] [facility] Admission (Discharged) from 12/04/2018 in [place] [facility]  AIMS Total Score 0 0 0 2 0      AUDIT    Flowsheet Row Admission (Discharged) from 10/25/2021 in [place] [facility] Admission (Discharged) from 09/03/2021 in [place] [facility] Admission (Discharged) from 06/01/2021 in [place] [facility] Admission (Discharged) from 02/02/2021 in [place] [facility] Admission (Discharged) from 12/04/2018 in [place] [facility]  Alcohol Use Disorder Identification Test Final Score (AUDIT) 1 2 0 0 0      Flowsheet Row Admission (Discharged)  from 10/25/2021 in [place] [facility] Most recent reading at 10/25/2021  2:00 PM ED from 10/25/2021 in [place] Most recent reading at 10/25/2021  5:30 AM Admission (Discharged) from 09/03/2021 in [place] [facility] Most recent reading at 09/05/2021  8:00 AM  C-SSRS RISK CATEGORY No Risk No Risk No Risk       Assessment and Plan: Patient is a 36 year old male with past psychiatric history of schizoaffective disorder bipolar type, paranoid schizophrenia, tardive dyskinesia, and anxiety and medical history of hypertension presented to [facility] as a walk-in for ADHD medication management.  Patient was referred by his PCP for management of ADHD.  Patient has been on Adderall for many years for ADHD.  His ADHD was diagnosed when he was 36 years old.  He has multiple psychiatric admissions for psychosis with most recent admission at [facility] from 10/25/2021 to 11/04/2021 due to psychosis in the context of medication noncompliance and methamphetamine abuse.  Patient was not discharged on Adderall and was not recommended to restart  it.  He reports that he had ADHD testing done at PCP office [place].  Patient has been getting LAI in this office but was not able to continue Ingrezza due to cost. Will not recommend starting stimulants due to recent psychiatric admission due to concerns of methamphetamine abuse.  Also stimulants can increase the risk of psychosis.  We will send patient for neuropsychiatric testing. Pt is still reporting hallucinations, will consider increasing dose of Prolixin at next visit.  PDMP reviewed and patient last refilled Adderall 30-day supply on 11/08/2021.  Schizoaffective disorder bipolar type -Continue Prolixin 25 mg LAI every 28 days.  -Continue trazodone 100 mg nightly  Tardive dyskinesia -Restart Ingrezza 40 mg daily.  14-day samples given to patient.  He will be connected to patient support for medication refill.  Attention deficits -Ambulatory referral to [facility] for ADHD testing. - Will not recommend starting stimulants due to recent psychiatric admission due to psychosis in the context of medication noncompliance and methamphetamine abuse.  She also stimulants can increase the risk of psychosis.    Follow up in 8 weeks.  Collaboration of Care: Psychiatrist AEB Dr Sande Rives  Patient/Guardian was advised Release of Information must be obtained prior to any record release in order to collaborate their care with an outside provider. Patient/Guardian was advised if they have not already done so to contact the registration department to sign all necessary forms in order for [place] to release information regarding their care.   Consent: Patient/Guardian gives verbal consent for treatment and assignment of benefits for services provided during this visit. Patient/Guardian expressed understanding and agreed to proceed.   Armando Reichert, MD 11/27/20239:06 AM

## 2021-12-08 ENCOUNTER — Encounter (HOSPITAL_COMMUNITY): Payer: Self-pay | Admitting: Psychiatry

## 2021-12-20 ENCOUNTER — Ambulatory Visit (HOSPITAL_COMMUNITY): Payer: Medicaid Other

## 2021-12-20 ENCOUNTER — Encounter (HOSPITAL_COMMUNITY): Payer: Self-pay

## 2021-12-20 VITALS — BP 140/101 | HR 87 | Ht 73.0 in | Wt 148.0 lb

## 2021-12-20 DIAGNOSIS — R41840 Attention and concentration deficit: Secondary | ICD-10-CM

## 2021-12-20 DIAGNOSIS — F250 Schizoaffective disorder, bipolar type: Secondary | ICD-10-CM

## 2021-12-20 DIAGNOSIS — F200 Paranoid schizophrenia: Secondary | ICD-10-CM

## 2021-12-20 DIAGNOSIS — G2401 Drug induced subacute dyskinesia: Secondary | ICD-10-CM

## 2021-12-20 MED ORDER — FLUPHENAZINE DECANOATE 25 MG/ML IJ SOLN
25.0000 mg | Freq: Once | INTRAMUSCULAR | Status: AC
  Administered 2021-12-20: 25 mg via INTRAMUSCULAR

## 2021-12-20 NOTE — Progress Notes (Signed)
PATIENT PRESENTS TO THE OFFICE FOR FLUPHENAZINE DECANOATE (PROLIXIN) INJECTION 25 mg  , HE TOLERATED THE INJECTION WELL AND WILL RETURN IN 28 DAYS    INJECTION WAS GIVEN BY Kasmira Cacioppo MA IN THE LEFT DELTOID

## 2021-12-27 ENCOUNTER — Ambulatory Visit: Payer: Self-pay | Admitting: Family Medicine

## 2022-01-17 ENCOUNTER — Ambulatory Visit (HOSPITAL_COMMUNITY): Payer: Medicaid Other

## 2022-01-23 ENCOUNTER — Telehealth (HOSPITAL_COMMUNITY): Payer: Self-pay | Admitting: *Deleted

## 2022-01-23 NOTE — Telephone Encounter (Signed)
Patient LVM to return call  & I called back no answer  & vm

## 2022-01-24 ENCOUNTER — Ambulatory Visit (HOSPITAL_COMMUNITY): Payer: Medicaid Other

## 2022-01-30 ENCOUNTER — Encounter (HOSPITAL_COMMUNITY): Payer: Medicaid Other | Admitting: Psychiatry

## 2022-03-15 ENCOUNTER — Ambulatory Visit (INDEPENDENT_AMBULATORY_CARE_PROVIDER_SITE_OTHER): Payer: No Typology Code available for payment source

## 2022-03-15 ENCOUNTER — Ambulatory Visit (INDEPENDENT_AMBULATORY_CARE_PROVIDER_SITE_OTHER)
Payer: No Typology Code available for payment source | Admitting: Student in an Organized Health Care Education/Training Program

## 2022-03-15 ENCOUNTER — Encounter (HOSPITAL_COMMUNITY): Payer: Self-pay

## 2022-03-15 ENCOUNTER — Encounter (HOSPITAL_COMMUNITY): Payer: Self-pay | Admitting: Student in an Organized Health Care Education/Training Program

## 2022-03-15 VITALS — BP 126/97 | HR 66 | Ht 73.0 in | Wt 134.0 lb

## 2022-03-15 VITALS — BP 126/97 | HR 66 | Ht 73.0 in | Wt 134.7 lb

## 2022-03-15 DIAGNOSIS — G2401 Drug induced subacute dyskinesia: Secondary | ICD-10-CM | POA: Diagnosis not present

## 2022-03-15 DIAGNOSIS — F200 Paranoid schizophrenia: Secondary | ICD-10-CM

## 2022-03-15 DIAGNOSIS — F250 Schizoaffective disorder, bipolar type: Secondary | ICD-10-CM | POA: Diagnosis not present

## 2022-03-15 DIAGNOSIS — R41840 Attention and concentration deficit: Secondary | ICD-10-CM

## 2022-03-15 DIAGNOSIS — G4700 Insomnia, unspecified: Secondary | ICD-10-CM | POA: Diagnosis not present

## 2022-03-15 MED ORDER — FLUPHENAZINE DECANOATE 25 MG/ML IJ SOLN: 25.0000 mg | INTRAMUSCULAR | 0 refills | Status: DC

## 2022-03-15 MED ORDER — TRAZODONE HCL 100 MG PO TABS: 100.0000 mg | ORAL_TABLET | Freq: Every day | ORAL | 0 refills | Status: DC

## 2022-03-15 MED ORDER — FLUPHENAZINE HCL 10 MG PO TABS: 5.0000 mg | ORAL_TABLET | Freq: Two times a day (BID) | ORAL | 0 refills | Status: DC

## 2022-03-15 MED ORDER — VALBENAZINE TOSYLATE 40 MG PO CAPS: 40.0000 mg | ORAL_CAPSULE | Freq: Every day | ORAL | 0 refills | Status: DC

## 2022-03-15 MED ORDER — ARIPIPRAZOLE 5 MG PO TABS: 5.0000 mg | ORAL_TABLET | Freq: Every day | ORAL | 0 refills | Status: DC

## 2022-03-15 NOTE — Progress Notes (Signed)
BH MD/PA/NP OP Progress Note  03/15/2022 4:04 PM Wesley Peterson  MRN:  [id number]  Chief Complaint:  Chief Complaint  Patient presents with   Follow-up   Schizophrenia   HPI:  Wesley Peterson is a 37 yr old male who presents for Follow Up and Medication Management.  PPHx is significant for Schizoaffective Disorder, Bipolar Type, Anxiety, Tardive Dyskinesia, Medication Noncompliance, and Methamphetamine Abuse, and multiple Psychiatric Hospitalizations (last [facility] 10/2021), and no history of Suicide Attempts or Self Injurious Behavior.  He reports that he has not been doing good since his last appointment.  He reports that he has not been on Prolixin as he missed his last LAI that was due in February and has not been on medication since.  He reports that the Prolixin was controlling his symptoms.  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  Discussed that we could restart Prolixin or consider a different medication.  When asked what medications he has been on in the past he could not remember.  Initially discussed trialing Abilify as a second generation would have a reduced risk of TD and he was agreeable with this.  However, on chart review during previous hospitalizations last year he had been trialed on Haldol, Zyprexa, Geodon, and Invega, also he had been on Risperdal in the past but it caused increased prolactin levels.  Had an in-depth discussion with him about potential options.  He reports that when he took the [place] it did control his TD symptoms but that he has a hard time remembering to take his medications.  After a long discussion he is agreeable to restarting oral Prolixin and Ingrezza for 1 week and then coming back next week to receive Prolixin LAI.  After reviewing PDMP asked if he had been taking his Adderall.  He reports that this helps control his symptoms and that when he does not take it his symptoms get worse.  Strongly advised him to not  take his Adderall until his Prolixin could be titrated and symptoms under full control but he is insistent that taking his Adderall helps with his symptoms.  When asked if he had been using any substances specifically methamphetamines he reports he has not.  He reports no SI, HI, or AVH.  He does report thought broadcasting, paranoia, and mind reading.  He reports his sleep is poor.  Discussed restarting Trazodone to help with his sleep and he was agreeable.  He reports his appetite is fair.  He reports no other concerns at present.  He return for follow-up next week.   Visit Diagnosis:    ICD-10-CM   1. Schizoaffective disorder, bipolar type ([place])  F25.0 fluPHENAZine decanoate (PROLIXIN) 25 MG/ML injection    fluPHENAZine (PROLIXIN) 10 MG tablet    DISCONTINUED: ARIPiprazole (ABILIFY) 5 MG tablet    2. Insomnia, unspecified type  G47.00 traZODone (DESYREL) 100 MG tablet    3. Tardive dyskinesia  G24.01 valbenazine (INGREZZA) 40 MG capsule      Past Psychiatric History: Schizoaffective Disorder, Bipolar Type, Anxiety, Tardive Dyskinesia, Medication Noncompliance, and Methamphetamine Abuse, and multiple Psychiatric Hospitalizations (last [facility] 10/2021), and no history of Suicide Attempts or Self Injurious Behavior.  Past Medical History:  Past Medical History:  Diagnosis Date   Schizophrenia ([place])    Schizophrenia, paranoid type ([place]) 02/02/2021   History reviewed. No pertinent surgical history.  Family Psychiatric History: Reports None  Family History: History reviewed. No pertinent family history.  Social  History:  Social History   Socioeconomic History   Marital status: Single    Spouse name: Not on file   Number of children: 1   Years of education: Not on file   Highest education level: Not on file  Occupational History   Occupation: Unemployed  Tobacco Use   Smoking status: Never    Passive exposure: Never   Smokeless tobacco: Current   Tobacco comments:    Vaping  nicotine   Vaping Use   Vaping Use: Not on file  Substance and Sexual Activity   Alcohol use: Not Currently    Alcohol/week: 1.0 standard drink of alcohol    Types: 1 Cans of beer per week    Comment: occasional   Drug use: Never   Sexual activity: Never  Other Topics Concern   Not on file  Social History Narrative   ** Merged History Encounter **       Pt lives with mother and other relatives.  Currently unemployed.  [place] outpatient psychiatry services through [facility].   Social Determinants of Health   Financial Resource Strain: Not on file  Food Insecurity: Food Insecurity Present (10/25/2021)   Hunger Vital Sign    Worried About Running Out of Food in the Last Year: Often true    Ran Out of Food in the Last Year: Often true  Transportation Needs: Unmet Transportation Needs (10/25/2021)   PRAPARE - Hydrologist (Medical): Yes    Lack of Transportation (Non-Medical): Yes  Physical Activity: Not on file  Stress: Not on file  Social Connections: Not on file    Allergies: No Known Allergies  Metabolic Disorder Labs: Lab Results  Component Value Date   HGBA1C 5.7 (H) 10/27/2021   MPG 116.89 10/27/2021   MPG 122.63 06/02/2021   Lab Results  Component Value Date   PROLACTIN 19.9 (H) 09/06/2021   PROLACTIN 17.5 (H) 06/02/2021   Lab Results  Component Value Date   CHOL 137 06/02/2021   TRIG 105 06/02/2021   HDL 36 (L) 06/02/2021   CHOLHDL 3.8 06/02/2021   VLDL 21 06/02/2021   LDLCALC 80 06/02/2021   LDLCALC 80 01/30/2021   Lab Results  Component Value Date   TSH 2.661 09/06/2021   TSH 1.354 06/02/2021    Therapeutic Level Labs: No results found for: "LITHIUM" No results found for: "VALPROATE" No results found for: "CBMZ"  Current Medications: Current Outpatient Medications  Medication Sig Dispense Refill   valbenazine (INGREZZA) 40 MG capsule Take 1 capsule (40 mg total) by mouth daily. 30 capsule 0   amLODipine  (NORVASC) 5 MG tablet Take 1 tablet (5 mg total) by mouth daily. 30 tablet 0   diphenhydrAMINE (BENADRYL) 50 MG capsule For stiffness to right arm 30 capsule 0   docusate sodium (COLACE) 100 MG capsule Take 1 capsule (100 mg total) by mouth daily. 30 capsule 0   fluPHENAZine (PROLIXIN) 10 MG tablet Take 0.5 tablets (5 mg total) by mouth in the morning and at bedtime. 60 tablet 0   fluPHENAZine decanoate (PROLIXIN) 25 MG/ML injection Inject 1 mL (25 mg total) into the muscle every 28 (twenty-eight) days. 1 mL 0   melatonin 3 MG TABS tablet Take 1 tablet (3 mg total) by mouth at bedtime as needed. 30 tablet 0   pantoprazole (PROTONIX) 40 MG tablet Take 1 tablet (40 mg total) by mouth daily. 30 tablet 0   traZODone (DESYREL) 100 MG tablet Take 1 tablet (100 mg total)  by mouth at bedtime. 30 tablet 0   No current facility-administered medications for this visit.     Musculoskeletal: Strength & Muscle Tone: within normal limits Gait & Station: normal Patient leans: N/A  Psychiatric Specialty Exam: Review of Systems  Respiratory:  Negative for cough and shortness of breath.   Cardiovascular:  Negative for chest pain.  Gastrointestinal:  Negative for abdominal pain, constipation, diarrhea, nausea and vomiting.  Neurological:  Negative for weakness and headaches.  Psychiatric/Behavioral:  Positive for dysphoric mood and sleep disturbance. Negative for hallucinations and suicidal ideas. The patient is not nervous/anxious.     Blood pressure (!) 126/97, pulse 66, height '6\' 1"'$  (1.854 m), weight 134 lb (60.8 kg), SpO2 97 %.Body mass index is 17.68 kg/m.  General Appearance: Casual and Fairly Groomed  Eye Contact:  Fair  Speech:  Clear and Coherent and Slow  Volume:  Normal  Mood:   "ok"  Affect:  Flat  Thought Process:  Linear Concrete   Orientation:  Full (Time, Place, and Person)  Thought Content: Paranoid Ideation, Environmental consultant, Mind reading   Suicidal Thoughts:  No  Homicidal  Thoughts:  No  Memory:  Immediate;   Poor Recent;   Poor  Judgement:  Poor  Insight:  Shallow  Psychomotor Activity:  Normal  Concentration:  Concentration: Fair and Attention Span: Fair  Recall:  [employer] of Knowledge: Fair  Language: Fair  Akathisia:  Negative  Handed:  Right  AIMS (if indicated): done AIMS= 0  Assets:  Desire for Improvement Housing Physical Health Resilience  ADL's:  Intact  Cognition: WNL  Sleep:  Poor   Screenings: [place] Office Visit from 03/15/2022 in [facility] Admission (Discharged) from 10/25/2021 in [place] [facility] Admission (Discharged) from 09/03/2021 in [place] [facility] Admission (Discharged) from 06/01/2021 in [place] [facility] Admission (Discharged) from 02/02/2021 in [place] [facility]  AIMS Total Score 0 0 0 0 2      AUDIT    Flowsheet Row Admission (Discharged) from 10/25/2021 in [place] [facility] Admission (Discharged) from 09/03/2021 in [place] [facility] Admission (Discharged) from 06/01/2021 in [place] [facility] Admission (Discharged) from 02/02/2021 in [place] [facility] Admission (Discharged) from 12/04/2018 in [place] [facility]  Alcohol Use Disorder Identification Test Final Score (AUDIT) 1 2 0 0 0      GAD-7    Flowsheet Row Office Visit from 03/15/2022 in [facility]  Total GAD-7 Score 9      PHQ2-9    [place] Office Visit from 03/15/2022 in [place]  PHQ-2 Total Score 2  PHQ-9 Total Score 17      Flowsheet Row Admission (Discharged) from 10/25/2021 in [place] [facility] Most recent reading at 10/25/2021  2:00 PM ED from 10/25/2021 in [facility]  Emergency Department at [facility] Most recent reading at 10/25/2021  5:30 AM Admission (Discharged) from 09/03/2021 in [place] [facility] Most recent reading at 09/05/2021  8:00 AM  C-SSRS RISK CATEGORY No Risk No Risk No Risk        Assessment and Plan:  Wesley Peterson is a 37 yr old male who presents for Follow Up and Medication Management.  PPHx is significant for Schizoaffective Disorder, Bipolar Type,  Anxiety, Tardive Dyskinesia, Medication Noncompliance, and Methamphetamine Abuse, and multiple Psychiatric Hospitalizations (last [facility] 10/2021), and no history of Suicide Attempts or Self Injurious Behavior.   Wesley Peterson has not been taking his medications and has been having worsening symptoms.  Initially had discussed starting Abilify but after chart review and his non-response to 30 mg oral we will not proceed with this.  After extensive discussion and review of failed trials we will restart Prolixin and Ingrezza as he did have TD when he was last on the Prolixin LAI (failed Invega, Zyprexa, Geodon, and Haldol, Risperdal caused increased Prolactin).  We will restart oral Prolixin for 1 week as well as Ingrezza.  He will return next Thursday to obtain the Prolixin LAI.  Strongly encouraged him to not take his Adderall while we are titrating his Prolixin.  We will also restart his Trazodone to help with his sleep.   Schizoaffective Disorder, Bipolar type: -Restart Prolixin 25 mg LAI every 28 days on 3/14 for psychosis.  1 injection with 0 refills. -Restart Prolixin oral 5 mg BID for psychosis.  60 (10 mg) tablets with 0 refills. -He has failed Invega, Zyprexa, Geodon, and Haldol, Risperdal caused increased Prolactin   Tardive Dyskinesia: -Restart Ingrezza 40 mg daily.  14-day samples given to patient.  30 tablets with 1 refill sent.   ADHD: -Currently being prescribed Adderall 5 mg BID by PCP.    Collaboration of Care:   Patient/Guardian was advised  Release of Information must be obtained prior to any record release in order to collaborate their care with an outside provider. Patient/Guardian was advised if they have not already done so to contact the registration department to sign all necessary forms in order for [place] to release information regarding their care.   Consent: Patient/Guardian gives verbal consent for treatment and assignment of benefits for services provided during this visit. Patient/Guardian expressed understanding and agreed to proceed.    Briant Cedar, MD 03/15/2022, 4:04 PM

## 2022-03-15 NOTE — Progress Notes (Signed)
Pt  will return on 03/23/22 11:30 Am

## 2022-03-23 ENCOUNTER — Encounter (HOSPITAL_COMMUNITY): Payer: Self-pay | Admitting: Registered Nurse

## 2022-03-23 ENCOUNTER — Ambulatory Visit (HOSPITAL_COMMUNITY)
Admission: EM | Admit: 2022-03-23 | Discharge: 2022-03-25 | Disposition: A | Discharge status: Other Acute Inpatient Hospital | Payer: No Typology Code available for payment source | Attending: Registered Nurse | Admitting: Registered Nurse

## 2022-03-23 ENCOUNTER — Ambulatory Visit (HOSPITAL_COMMUNITY): Payer: No Typology Code available for payment source | Admitting: *Deleted

## 2022-03-23 ENCOUNTER — Other Ambulatory Visit: Payer: Self-pay

## 2022-03-23 ENCOUNTER — Ambulatory Visit (HOSPITAL_COMMUNITY): Payer: No Typology Code available for payment source

## 2022-03-23 VITALS — BP 137/98 | HR 103 | Ht 73.0 in | Wt 133.0 lb

## 2022-03-23 DIAGNOSIS — F250 Schizoaffective disorder, bipolar type: Secondary | ICD-10-CM

## 2022-03-23 DIAGNOSIS — Z1152 Encounter for screening for COVID-19: Secondary | ICD-10-CM | POA: Insufficient documentation

## 2022-03-23 DIAGNOSIS — G2401 Drug induced subacute dyskinesia: Secondary | ICD-10-CM

## 2022-03-23 DIAGNOSIS — G4700 Insomnia, unspecified: Secondary | ICD-10-CM | POA: Diagnosis present

## 2022-03-23 DIAGNOSIS — R440 Auditory hallucinations: Secondary | ICD-10-CM

## 2022-03-23 DIAGNOSIS — Z91148 Patient's other noncompliance with medication regimen for other reason: Secondary | ICD-10-CM | POA: Insufficient documentation

## 2022-03-23 LAB — CBC WITH DIFFERENTIAL/PLATELET
Abs Immature Granulocytes: 0.01 10*3/uL (ref 0.00–0.07)
Basophils Absolute: 0.1 10*3/uL (ref 0.0–0.1)
Basophils Relative: 1 %
Eosinophils Absolute: 0.1 10*3/uL (ref 0.0–0.5)
Eosinophils Relative: 3 %
HCT: 46.9 % (ref 39.0–52.0)
Hemoglobin: 15.9 g/dL (ref 13.0–17.0)
Immature Granulocytes: 0 %
Lymphocytes Relative: 42 %
Lymphs Abs: 1.8 10*3/uL (ref 0.7–4.0)
MCH: 31.2 pg (ref 26.0–34.0)
MCHC: 33.9 g/dL (ref 30.0–36.0)
MCV: 92.1 fL (ref 80.0–100.0)
Monocytes Absolute: 0.5 10*3/uL (ref 0.1–1.0)
Monocytes Relative: 11 %
Neutro Abs: 1.9 10*3/uL (ref 1.7–7.7)
Neutrophils Relative %: 43 %
Platelets: 383 10*3/uL (ref 150–400)
RBC: 5.09 MIL/uL (ref 4.22–5.81)
RDW: 13.2 % (ref 11.5–15.5)
WBC: 4.4 10*3/uL (ref 4.0–10.5)
nRBC: 0 % (ref 0.0–0.2)

## 2022-03-23 LAB — LIPID PANEL
Cholesterol: 150 mg/dL (ref 0–200)
HDL: 46 mg/dL (ref >40)
LDL Cholesterol: 81 mg/dL (ref 0–99)
Total CHOL/HDL Ratio: 3.3 RATIO
Triglycerides: 115 mg/dL (ref <150)
VLDL: 23 mg/dL (ref 0–40)

## 2022-03-23 LAB — COMPREHENSIVE METABOLIC PANEL
ALT: 8 U/L (ref 0–44)
AST: 18 U/L (ref 15–41)
Albumin: 4.5 g/dL (ref 3.5–5.0)
Alkaline Phosphatase: 50 U/L (ref 38–126)
Anion gap: 16 — ABNORMAL HIGH (ref 5–15)
BUN: 6 mg/dL (ref 6–20)
CO2: 22 mmol/L (ref 22–32)
Calcium: 9.9 mg/dL (ref 8.9–10.3)
Chloride: 100 mmol/L (ref 98–111)
Creatinine, Ser: 1.01 mg/dL (ref 0.61–1.24)
GFR, Estimated: >60 mL/min (ref >60)
Glucose, Bld: 62 mg/dL — ABNORMAL LOW (ref 70–99)
Potassium: 4.3 mmol/L (ref 3.5–5.1)
Sodium: 138 mmol/L (ref 135–145)
Total Bilirubin: 0.1 mg/dL — ABNORMAL LOW (ref 0.3–1.2)
Total Protein: 7.1 g/dL (ref 6.5–8.1)

## 2022-03-23 LAB — MAGNESIUM: Magnesium: 2.2 mg/dL (ref 1.7–2.4)

## 2022-03-23 LAB — ETHANOL: Alcohol, Ethyl (B): <10 mg/dL (ref <10)

## 2022-03-23 LAB — RESP PANEL BY RT-PCR (RSV, FLU A&B, COVID)  RVPGX2
Influenza A by PCR: NEGATIVE
Influenza B by PCR: NEGATIVE
Resp Syncytial Virus by PCR: NEGATIVE
SARS Coronavirus 2 by RT PCR: NEGATIVE

## 2022-03-23 LAB — TSH: TSH: 2.083 u[IU]/mL (ref 0.350–4.500)

## 2022-03-23 MED ORDER — ALUM & MAG HYDROXIDE-SIMETH 200-200-20 MG/5ML PO SUSP: 30.0000 mL | ORAL | Status: DC | PRN

## 2022-03-23 MED ORDER — DOCUSATE SODIUM 100 MG PO CAPS
100.0000 mg | ORAL_CAPSULE | Freq: Every day | ORAL | Status: DC
  Administered 2022-03-23 – 2022-03-25 (×3): 100 mg via ORAL
  Filled 2022-03-23 (×3): qty 1

## 2022-03-23 MED ORDER — LORAZEPAM 2 MG/ML IJ SOLN
2.0000 mg | Freq: Once | INTRAMUSCULAR | Status: AC
  Administered 2022-03-23: 2 mg via INTRAMUSCULAR
  Filled 2022-03-23: qty 1

## 2022-03-23 MED ORDER — TRAZODONE HCL 50 MG PO TABS: 50.0000 mg | ORAL_TABLET | Freq: Every evening | ORAL | Status: DC | PRN

## 2022-03-23 MED ORDER — VALBENAZINE TOSYLATE 40 MG PO CAPS
40.0000 mg | ORAL_CAPSULE | Freq: Every day | ORAL | Status: DC
  Administered 2022-03-23 – 2022-03-25 (×3): 40 mg via ORAL
  Filled 2022-03-23 (×3): qty 1

## 2022-03-23 MED ORDER — HYDROXYZINE HCL 25 MG PO TABS
25.0000 mg | ORAL_TABLET | Freq: Three times a day (TID) | ORAL | Status: DC | PRN
  Administered 2022-03-23 – 2022-03-24 (×2): 25 mg via ORAL
  Filled 2022-03-23 (×2): qty 1

## 2022-03-23 MED ORDER — AMLODIPINE BESYLATE 5 MG PO TABS
5.0000 mg | ORAL_TABLET | Freq: Every day | ORAL | Status: DC
  Administered 2022-03-23 – 2022-03-25 (×3): 5 mg via ORAL
  Filled 2022-03-23 (×3): qty 1

## 2022-03-23 MED ORDER — ACETAMINOPHEN 325 MG PO TABS: 650.0000 mg | ORAL_TABLET | Freq: Four times a day (QID) | ORAL | Status: DC | PRN

## 2022-03-23 MED ORDER — TRAZODONE HCL 100 MG PO TABS
100.0000 mg | ORAL_TABLET | Freq: Every day | ORAL | Status: DC
  Administered 2022-03-23 – 2022-03-24 (×2): 100 mg via ORAL
  Filled 2022-03-23 (×2): qty 1

## 2022-03-23 MED ORDER — PANTOPRAZOLE SODIUM 40 MG PO TBEC
40.0000 mg | DELAYED_RELEASE_TABLET | Freq: Every day | ORAL | Status: DC
  Administered 2022-03-23 – 2022-03-25 (×3): 40 mg via ORAL
  Filled 2022-03-23 (×3): qty 1

## 2022-03-23 MED ORDER — MAGNESIUM HYDROXIDE 400 MG/5ML PO SUSP: 30.0000 mL | Freq: Every day | ORAL | Status: DC | PRN
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
## 2022-03-23 NOTE — ED Notes (Signed)
Patient was able to take his medications without difficulty. Patient was able to swallow and ask questions in between each pill. Patient was talking after he took his medications. Patient did not have any complications at that time.

## 2022-03-23 NOTE — Progress Notes (Signed)
   03/23/22 1355  [place] (Walk-ins at [facility] only)  How Did You Hear About [place]? Other (Comment) (Referred by outpatient provider.)  What Is the Reason for Your Visit/Call Today? Urgent: Kimari Coudriet is a 44 male presenting to the [facility]. He was transported from the outpatient [facility], referred by Dr. Lestine Mount, MD/Shuvon Rankin, NP. Hx is significant for Schizoaffective Disorder, Bipolar Type, Anxiety, Tardive Dyskinesia, Medication Noncompliance, and Methamphetamine Abuse, and multiple Psychiatric Hospitalizations (last [facility] 10/2021). No SI/HI and no history of Suicide Attempts or Self Injurious Behavior. Patient states that he is experiencing auditory hallucinations, "Knocking noises". He is severely paranoid; ongoing related symptoms x2 weeks. Non compliant with PO medications. Recent medication changes by outpatient providers. Lives alone. Unemployed. Father is supportive and in the lobby.  How Long Has This Been Causing You Problems? 1 wk - 1 month  Have You Recently Had Any Thoughts About Hurting Yourself? No  Are You Planning to Commit Suicide/Harm Yourself At This time? No  Have you Recently Had Thoughts About [place]? No  Are You Planning To Harm Someone At This Time? No  Are you currently experiencing any auditory, visual or other hallucinations? Yes  Please explain the hallucinations you are currently experiencing: "Voices", "Someone talking in my head", "People are trying to hurt me".  Have You Used Any Alcohol or Drugs in the Past 24 Hours? No  Do you have any current medical co-morbidities that require immediate attention? No  Clinician description of patient physical appearance/behavior: Patient is very paranoid. Guarded in responses.  What Do You Feel Would Help You the Most Today? Treatment for Depression or other mood problem;Medication(s)  If access to [facility] was not available, would you have sought care in the Emergency Department? No   Determination of Need Urgent (48 hours)  Options For Referral Medication Management;Inpatient Hospitalization (ACTT services)

## 2022-03-23 NOTE — ED Notes (Signed)
Pt admitted to obs. Denies   SI/HI/AVH. Calm, cooperative throughout interview process. Skin assessment completed. Oriented to unit. Meal and drink offered. At [place], pt continue to denies SI/HI/AVH. Pt verbally contract for safety. Will monitor for safety. Patient had 9 addrell tablets pharmacy states to leave in his locker

## 2022-03-23 NOTE — ED Notes (Signed)
Patient is in bed watching tv. Environment is secured. Will continue to monitor for safety.

## 2022-03-23 NOTE — ED Notes (Signed)
Pt A&O x 4, resting at present, no distress noted.  Monitoring for safety.  Skin color good, respirations even & unlabored.  Monitoring for safety.  Pt is IVC.

## 2022-03-23 NOTE — ED Notes (Signed)
Patient has 9 addrell tablets on admission. Pharmacy states to put in his locker.

## 2022-03-23 NOTE — ED Provider Notes (Signed)
[facility] Continuous Assessment Admission H&P  Date: 03/23/22 Patient Name: Wesley Peterson MRN: [id number] Chief Complaint: paranoia and psychosis  Diagnoses:  Final diagnoses:  Schizoaffective disorder, bipolar type ([place])  Tardive dyskinesia  Auditory hallucination    HPI: Wesley Peterson is a 48 yr. old male patient with history of Schizoaffective Disorder, Bipolar Type, Anxiety, Tardive Dyskinesia, Medication Noncompliance, and Methamphetamine Abuse.  He presented to [facility] outpatient today for a face-to-face evaluation medication management.  He was transferred to Urgent care related to symptoms presenting during assessment.  Complaints of auditory, hallucinations and paranoia     Chart review:  During his last face to face visit 03/15/2022 medication changes were made related to adverse reaction to medications (TD).  He requested to be restarted on his Prolixin LAI related to it helping his symptoms of paranoia and auditory hallucinations.  A discussion between him and the provider that he would try Prolixin by mouth along with Ingrezza prior to receiving the Prolixin LAI to assure that it would help control the TD.  Sara states that he has not taken any of the Ingrezza and only took one Prolixin pill related to not remembering to take the medicine.  He is aware that he does not do well with taking medications by mouth and does better with the control of symptoms when taking a LAI.   Wesley Peterson seen face to face by this provider, consulted with Dr. Hampton Abbot; and chart reviewed on 03/23/22.  On evaluation Wesley Peterson reports that he is not doing well.  He states that he is hearing tapping, knocking, stomping, and kicking on the walls and his doors.  He states that his neighbors are spying on him, watching, and trying to look through the blinds on his window.  He states that he feels that everyone is keeping things from him and not telling him what is going on.  He  then asks "Do you know what's going on.  Are you going to tell me."  He states that he feels like he is not in his own body, confused, and that he has not been sleeping.  He states that he lives alone but doesn't like it because of the neighbors watching as stated above.  Patients father is with him but doesn't want father in room during assessment but gave permission to speak with his father.   Patient also informs that he is prescribed Adderall twice daily.  States when he takes the Adderall he feels like he is having an out of body experience.  Informed because of his history of methamphetamine abuse and the reaction that he has with the Adderall he shouldn't take it and it should be discontinued.  Understanding voiced.    During evaluation Wesley Peterson is sitting up in chair with no noted distress.  He is alert/oriented to self and time but not place.  He is calm but very guarded and paranoid.  He is able to respond appropriately to questions but feels that staff His mood is dysphoric, anxious, and guarded with flat affect.  He spoke in a clear tone at moderate volume, normal pace but at times there appeared to be some thought blocking or possible guarded with information.  He denies suicidal/self-harm/homicidal ideation.  Objectively:  Patients presentation is guarded, paranoid, and he appear to be responding to internal stimuli as evident of looking around the room.  He is very hesitant to talk to staff and feels that staff is out to  mislead or hurt him.  With redirection and persuasion he agreed to admission to continuous assessment to monitor medication administration and to see if had any adverse reaction.  Every procedure has to be explained and patient assured that it is occurring to help and not harm him.  He does better with small number of staff given care, he becomes very guarded if different people try to care for him.  Staff had to be introduced by this Probation officer or Butch Penny (outpatient staff) before  patient would feel comfort with someone different providing care for him.      Total Time spent with patient: 1.5 hours  Musculoskeletal  Strength & Muscle Tone: within normal limits Gait & Station: normal Patient leans: N/A  Psychiatric Specialty Exam  Presentation General Appearance:  Appropriate for Environment  Eye Contact: Good  Speech: Clear and Coherent; Normal Rate  Speech Volume: Normal  Handedness: Right   Mood and Affect  Mood: Anxious; Dysphoric  Affect: Flat   Thought Process  Thought Processes: Coherent; Disorganized; Goal Directed  Descriptions of Associations:Circumstantial  Orientation:Partial (Oriented to self, time but not place)  Thought Content:Logical; Paranoid Ideation  Diagnosis of Schizophrenia or Schizoaffective disorder in past: Yes  Duration of Psychotic Symptoms: Greater than six months  Hallucinations:Hallucinations: Auditory Description of Auditory Hallucinations: Reports he is hearing tapping, knocking, and kicking on the walls and doors of his home  Ideas of Reference:Paranoia (He feel that people are watching him, out to get him, spying and keeping things from him)  Suicidal Thoughts:Suicidal Thoughts: No  Homicidal Thoughts:Homicidal Thoughts: No   Sensorium  Memory: Immediate Fair; Recent Fair; Remote Fair  Judgment: Fair  Insight: Fair; Present   Executive Functions  Concentration: Good  Attention Span: Good  Recall: [place] of Knowledge: Good  Language: Good   Psychomotor Activity  Psychomotor Activity: Psychomotor Activity: Extrapyramidal Side Effects (EPS) Extrapyramidal Side Effects (EPS): Tardive Dyskinesia AIMS Completed?: No   Assets  Assets: Communication Skills; Desire for Improvement; Financial Resources/Insurance; Housing; Leisure Time; Physical Health; Resilience; Social Support; Transportation   Sleep  Sleep: Sleep: Poor   Nutritional Assessment (For OBS and FBC  admissions only) Has the patient had a weight loss or gain of 10 pounds or more in the last 3 months?: No Has the patient had a decrease in food intake/or appetite?: Yes Does the patient have dental problems?: No Does the patient have eating habits or behaviors that may be indicators of an eating disorder including binging or inducing vomiting?: No Has the patient recently lost weight without trying?: 0 Has the patient been eating poorly because of a decreased appetite?: 0 (Reports difficulty eating related to the muscle contracting in throat making it hard to swallow) Malnutrition Screening Tool Score: 0    Physical Exam Vitals and nursing note reviewed. Chaperone present: Father in waiting area.  Constitutional:      General: He is not in acute distress.    Appearance: Normal appearance. He is not ill-appearing.  HENT:     Head: Normocephalic.  Eyes:     Conjunctiva/sclera: Conjunctivae normal.  Cardiovascular:     Rate and Rhythm: Normal rate.  Pulmonary:     Effort: Pulmonary effort is normal. No respiratory distress.  Musculoskeletal:        General: Normal range of motion.     Cervical back: Normal range of motion.  Skin:    General: Skin is warm and dry.  Neurological:     General: No focal deficit present.  Mental Status: He is alert and oriented to person, place, and time.  Psychiatric:        Attention and Perception: He perceives auditory hallucinations.        Mood and Affect: Mood is anxious and depressed. Affect is flat.        Speech: Speech normal.        Behavior: Withdrawn: guarded. Behavior is cooperative.        Thought Content: Thought content is paranoid.        Cognition and Memory: Cognition normal.        Judgment: Judgment is impulsive.   Review of Systems  Psychiatric/Behavioral:  Positive for hallucinations (auditory hallucinations). Depression: dysphoric. Substance abuse: Denies.The patient is nervous/anxious and has insomnia (Reports he is  not sleeping).   All other systems reviewed and are negative.   Blood pressure (!) 117/95, pulse 88, temperature 98.5 F (36.9 C), temperature source Oral, resp. rate 18, SpO2 100 %. There is no height or weight on file to calculate BMI.  Past Psychiatric History:  Patient Active Problem List   Diagnosis Date Noted   Essential hypertension, benign 10/26/2021   Anxiety state 06/02/2021   Insomnia 06/02/2021   Schizoaffective disorder, depressive type ([place]) 06/01/2021   Suicidal ideation    Tardive dyskinesia 02/08/2021   Schizoaffective disorder, bipolar type ([place]) 02/04/2021   Homeless 02/04/2021   Agitation    Auditory hallucination    Schizophrenia ([place]) 12/04/2018   Schizophrenia, paranoid ([place])    Psychosis ([place]) 11/27/2018      Is the patient at risk to self? Yes  Has the patient been a risk to self in the past 6 months? Yes .    Has the patient been a risk to self within the distant past? No   Is the patient a risk to others? Yes   Has the patient been a risk to others in the past 6 months? No   Has the patient been a risk to others within the distant past? No   Past Medical History: Hypertension Past Medical History:  Diagnosis Date   Schizophrenia ([place])    Schizophrenia, paranoid type ([place]) 02/02/2021     Family History: History reviewed. No pertinent family history.  None reported  Social History:  Social History   Socioeconomic History   Marital status: Single    Spouse name: Not on file   Number of children: 1   Years of education: Not on file   Highest education level: Not on file  Occupational History   Occupation: Unemployed  Tobacco Use   Smoking status: Never    Passive exposure: Never   Smokeless tobacco: Current   Tobacco comments:    Vaping nicotine   Vaping Use   Vaping Use: Not on file  Substance and Sexual Activity   Alcohol use: Not Currently    Alcohol/week: 1.0 standard drink of alcohol    Types: 1 Cans of beer per week     Comment: occasional   Drug use: Never   Sexual activity: Never  Other Topics Concern   Not on file  Social History Narrative   ** Merged History Encounter **       Pt lives with mother and other relatives.  Currently unemployed.  [place] outpatient psychiatry services through [facility].   Social Determinants of Health   Financial Resource Strain: Not on file  Food Insecurity: Food Insecurity Present (10/25/2021)   Hunger Vital Sign    Worried About Running Out of  Food in the Last Year: Often true    Ran Out of Food in the Last Year: Often true  Transportation Needs: Unmet Transportation Needs (10/25/2021)   PRAPARE - Hydrologist (Medical): Yes    Lack of Transportation (Non-Medical): Yes  Physical Activity: Not on file  Stress: Not on file  Social Connections: Not on file  Intimate Partner Violence: Patient Declined (10/25/2021)   Humiliation, Afraid, Rape, and Kick questionnaire    Fear of Current or Ex-Partner: Patient declined    Emotionally Abused: Patient declined    Physically Abused: Patient declined    Sexually Abused: Patient declined     Last Labs:  Admission on 10/25/2021, Discharged on 11/04/2021  Component Date Value Ref Range Status   Hgb A1c MFr Bld 10/27/2021 5.7 (H)  4.8 - 5.6 % Final   Comment: (NOTE) Pre diabetes:          5.7%-6.4%  Diabetes:              >6.4%  Glycemic control for   <7.0% adults with diabetes    Mean Plasma Glucose 10/27/2021 116.89  mg/dL Final   Performed at [facility], [address] [place], [place] [address]  Admission on 10/25/2021, Discharged on 10/25/2021  Component Date Value Ref Range Status   WBC 10/25/2021 5.6  4.0 - 10.5 K/uL Final   RBC 10/25/2021 4.34  4.22 - 5.81 MIL/uL Final   Hemoglobin 10/25/2021 13.7  13.0 - 17.0 g/dL Final   HCT 10/25/2021 41.8  39.0 - 52.0 % Final   MCV 10/25/2021 96.3  80.0 - 100.0 fL Final   MCH 10/25/2021 31.6  26.0 - 34.0 pg Final   MCHC  10/25/2021 32.8  30.0 - 36.0 g/dL Final   RDW 10/25/2021 13.2  11.5 - 15.5 % Final   Platelets 10/25/2021 281  150 - 400 K/uL Final   nRBC 10/25/2021 0.0  0.0 - 0.2 % Final   Performed at [facility], [place] [address], [place], [place] [address]   Sodium 10/25/2021 141  135 - 145 mmol/L Final   Potassium 10/25/2021 4.0  3.5 - 5.1 mmol/L Final   Chloride 10/25/2021 105  98 - 111 mmol/L Final   CO2 10/25/2021 21 (L)  22 - 32 mmol/L Final   Glucose, Bld 10/25/2021 76  70 - 99 mg/dL Final   Glucose reference range applies only to samples taken after fasting for at least 8 hours.   BUN 10/25/2021 8  6 - 20 mg/dL Final   Creatinine, Ser 10/25/2021 0.84  0.61 - 1.24 mg/dL Final   Calcium 10/25/2021 10.2  8.9 - 10.3 mg/dL Final   Total Protein 10/25/2021 7.1  6.5 - 8.1 g/dL Final   Albumin 10/25/2021 4.5  3.5 - 5.0 g/dL Final   AST 10/25/2021 18  15 - 41 U/L Final   ALT 10/25/2021 12  0 - 44 U/L Final   Alkaline Phosphatase 10/25/2021 57  38 - 126 U/L Final   Total Bilirubin 10/25/2021 0.7  0.3 - 1.2 mg/dL Final   GFR, Estimated 10/25/2021 >60  >60 mL/min Final   Comment: (NOTE) Calculated using the CKD-EPI Creatinine Equation (2021)    Anion gap 10/25/2021 15  5 - 15 Final   Performed at [facility] [address] [place],  [address]   Opiates 10/25/2021 NONE DETECTED  NONE DETECTED Final   Cocaine 10/25/2021 NONE DETECTED  NONE DETECTED Final  Benzodiazepines 10/25/2021 NONE DETECTED  NONE DETECTED Final   Amphetamines 10/25/2021 POSITIVE (A)  NONE DETECTED Final   Tetrahydrocannabinol 10/25/2021 NONE DETECTED  NONE DETECTED Final   Barbiturates 10/25/2021 NONE DETECTED  NONE DETECTED Final   Comment: (NOTE) DRUG SCREEN FOR MEDICAL PURPOSES ONLY.  IF CONFIRMATION IS NEEDED FOR ANY PURPOSE, NOTIFY LAB WITHIN 5 DAYS.  LOWEST DETECTABLE LIMITS FOR URINE DRUG SCREEN Drug Class                     Cutoff (ng/mL) Amphetamine and metabolites    1000 Barbiturate and  metabolites    200 Benzodiazepine                 200 Opiates and metabolites        300 Cocaine and metabolites        300 THC                            50 Performed at [facility], [place] [address], [place], [place] [address]    Acetaminophen (Tylenol), Serum 10/25/2021 <10 (L)  10 - 30 ug/mL Final   Comment: (NOTE) Therapeutic concentrations vary significantly. A range of 10-30 ug/mL  may be an effective concentration for many patients. However, some  are best treated at concentrations outside of this range. Acetaminophen concentrations >150 ug/mL at 4 hours after ingestion  and >50 ug/mL at 12 hours after ingestion are often associated with  toxic reactions.  Performed at [facility], [place] [address] [place], [place] [id number]    Salicylate Lvl 123XX123 <7.0 (L)  7.0 - 30.0 mg/dL Final   Performed at [facility] [address], [place], [place] [address]   Alcohol, Ethyl (B) 10/25/2021 <10  <10 mg/dL Final   Comment: (NOTE) Lowest detectable limit for serum alcohol is 10 mg/dL.  For medical purposes only. Performed at [facility], [place] [address] [place], [place] [address]    SARS Coronavirus 2 by RT PCR 10/25/2021 NEGATIVE  NEGATIVE Final   Comment: (NOTE) SARS-CoV-2 target nucleic acids are NOT DETECTED.  The SARS-CoV-2 RNA is generally detectable in upper and lower respiratory specimens during the acute phase of infection. The lowest concentration of SARS-CoV-2 viral copies this assay can detect is 250 copies / mL. A negative result does not preclude SARS-CoV-2 infection and should not be used as the sole basis for treatment or other patient management decisions.  A negative result may occur with improper specimen collection / handling, submission of specimen other than nasopharyngeal swab, presence of viral mutation(s) within the areas targeted by this assay, and inadequate number of viral copies (<250 copies / mL). A negative result  must be combined with clinical observations, patient history, and epidemiological information.  Fact Sheet for Patients:   https://www.patel.info/  Fact Sheet for Healthcare Providers: https://hall.com/  This test is not yet approved or                           cleared by the [place] FDA and has been authorized for detection and/or diagnosis of SARS-CoV-2 by FDA under an Emergency Use Authorization (EUA).  This EUA will remain in effect (meaning this test can be used) for the duration of the COVID-19 declaration under Section 564(b)(1) of the Act, 21 U.S.C. section 360bbb-3(b)(1), unless the authorization is terminated or revoked sooner.  Performed at [facility], [place] [address], [place], [place] [address]     Allergies: Patient has no known allergies.  Medications:  Facility Ordered Medications  Medication   acetaminophen (TYLENOL) tablet 650 mg   alum & mag hydroxide-simeth (MAALOX/MYLANTA) 200-200-20 MG/5ML suspension 30 mL   magnesium hydroxide (MILK OF MAGNESIA) suspension 30 mL   hydrOXYzine (ATARAX) tablet 25 mg   traZODone (DESYREL) tablet 50 mg   PTA Medications  Medication Sig   docusate sodium (COLACE) 100 MG capsule Take 1 capsule (100 mg total) by mouth daily.   pantoprazole (PROTONIX) 40 MG tablet Take 1 tablet (40 mg total) by mouth daily.   melatonin 3 MG TABS tablet Take 1 tablet (3 mg total) by mouth at bedtime as needed.   amLODipine (NORVASC) 5 MG tablet Take 1 tablet (5 mg total) by mouth daily.   diphenhydrAMINE (BENADRYL) 50 MG capsule For stiffness to right arm   traZODone (DESYREL) 100 MG tablet Take 1 tablet (100 mg total) by mouth at bedtime.   fluPHENAZine decanoate (PROLIXIN) 25 MG/ML injection Inject 1 mL (25 mg total) into the muscle every 28 (twenty-eight) days.   fluPHENAZine (PROLIXIN) 10 MG tablet Take 0.5 tablets (5 mg total) by mouth in the morning and at bedtime.   valbenazine  (INGREZZA) 40 MG capsule Take 1 capsule (40 mg total) by mouth daily.    Medical Decision Making  Wesley Peterson was admitted to [facility] continuous assessment unit for Schizoaffective disorder, bipolar type [facility]), crisis management, and stabilization. Routine labs ordered, which include  Lab Orders         Resp panel by RT-PCR (RSV, Flu A&B, Covid) Anterior Nasal Swab         CBC with Differential/Platelet         Comprehensive metabolic panel         Hemoglobin A1c         Magnesium         Ethanol         Lipid panel         TSH         Prolactin         Urinalysis, Routine w reflex microscopic -Urine, Clean Catch         POCT Urine Drug Screen - (I-Screen)    Medication Management: Medications started Meds ordered this encounter  Medications   acetaminophen (TYLENOL) tablet 650 mg   alum & mag hydroxide-simeth (MAALOX/MYLANTA) 200-200-20 MG/5ML suspension 30 mL   magnesium hydroxide (MILK OF MAGNESIA) suspension 30 mL   hydrOXYzine (ATARAX) tablet 25 mg   traZODone (DESYREL) tablet 50 mg   amLODipine (NORVASC) tablet 5 mg   docusate sodium (COLACE) capsule 100 mg   pantoprazole (PROTONIX) EC tablet 40 mg   LORazepam (ATIVAN) injection 2 mg   traZODone (DESYREL) tablet 100 mg   valbenazine (INGREZZA) capsule 40 mg    Will maintain continuous observation for safety. Started on Abilify 10 mg PO daily.  Plan is to continue Abilify and if no adverse reaction start Abilify Maintena.  Will continue the Ingrezza for TD (movement of lips, jaw).  Was also given 2 mg IM of ativan to assess if the tightness and the difficulty swallowing is related to catatonia.  If so will start Ativan 2 mg Q 8 hours.  Trazodone restarted for insomnia.  Other psychotropic medications discontinued.    Inpatient psychiatric treatment recommended.  Social work to continue looking for  appropriate bed for inpatient psychiatric treatment.  IVC petition by this provider related to  patient paranoia and reluctant to go inpatient.  Adderall discontinued related to history of meth use and the feeling of euphoria/out of body experience patient has when taking it .     Recommendations  Based on my evaluation the patient does not appear to have an emergency medical condition.  Wesley Ullman, NP 03/23/22  2:29 PM

## 2022-03-23 NOTE — ED Notes (Signed)
Patient  sleeping in no acute stress. RR even and unlabored .Environment secured .Will continue to monitor for safely. 

## 2022-03-24 LAB — HEMOGLOBIN A1C
Hgb A1c MFr Bld: 5.8 % — ABNORMAL HIGH (ref 4.8–5.6)
Mean Plasma Glucose: 120 mg/dL

## 2022-03-24 MED ORDER — ARIPIPRAZOLE 15 MG PO TABS
15.0000 mg | ORAL_TABLET | Freq: Every day | ORAL | Status: DC
  Administered 2022-03-24 – 2022-03-25 (×2): 15 mg via ORAL
  Filled 2022-03-24 (×2): qty 1

## 2022-03-24 NOTE — ED Provider Notes (Signed)
Behavioral Health Progress Note  Date and Time: 03/24/2022 2:33 PM Name: Wesley Peterson MRN:  [id number]  Subjective:  Wesley Peterson is a 24 yr. old male patient with history of Schizoaffective Disorder, Bipolar Type, Anxiety, Tardive Dyskinesia, Medication Noncompliance, and Methamphetamine Abuse.  He presented to [facility] outpatient for a face-to-face evaluation medication management and transferred to the [facility] for complaints of auditory, hallucinations and paranoia. He was IVC'd due to these symptoms and reluctance to seek care.     On assessment today, patient is initially sleeping. He reports that his mood is "same as yesterday." He reports improvement to the feeling in his neck/throat after the dose of Ativan yesterday. He states that he feels ready to go home, but he is advised that he is under an involuntary commitment. We then discuss medications and the need to get his medications adjusted, to which he voices understanding. Patient denies SI, HI, and VH. He continues to endorse AH that "he tries to ignore" and refuses to disclose what is heard. He acknowledges that these AH are distressing to him. Patient denies paranoia, thought broadcasting, thought insertion/withdrawal, or receiving special messages through electronics.   He is advised that we are awaiting an inpatient bed for him, and although he does not like it, he voices understanding.    Diagnosis:  Final diagnoses:  Schizoaffective disorder, bipolar type ([place])  Tardive dyskinesia  Auditory hallucination    Total Time spent with patient: 20 minutes  Past Psychiatric History: See H&P Past Medical History: See chart Family History: See chart Family Psychiatric  History: See H&P Social History: See H&P  Additional Social History:    Pain Medications: See MAR Prescriptions: See MAR History of alcohol / drug use?: Yes Longest period of sobriety (when/how long): N/A Withdrawal Symptoms: None Name of  Substance 1: Methamphetamine (per noted history) 1 - Age of First Use: Patient did not disclose or provide a response when asked due to his presenting psychosis/paranoia. 1 - Amount (size/oz): Patient did not disclose or provide a response when asked due to his presenting psychosis/paranoia. 1 - Frequency: Patient did not disclose or provide a response when asked due to his presenting psychosis/paranoia. 1 - Duration: Patient did not disclose or provide a response when asked due to his presenting psychosis/paranoia. 1 - Last Use / Amount: Patient did not disclose or provide a response when asked due to his presenting psychosis/paranoia. 1 - Method of Aquiring: Patient did not disclose or provide a response when asked due to his presenting psychosis/paranoia. 1- Route of Use: Patient did not disclose or provide a response when asked due to his presenting psychosis/paranoia.                  Sleep: Good  Appetite:  Fair  Current Medications:  Current Facility-Administered Medications  Medication Dose Route Frequency Provider Last Rate Last Admin   acetaminophen (TYLENOL) tablet 650 mg  650 mg Oral Q6H PRN Rankin, Shuvon B, NP       alum & mag hydroxide-simeth (MAALOX/MYLANTA) 200-200-20 MG/5ML suspension 30 mL  30 mL Oral Q4H PRN Rankin, Shuvon B, NP       amLODipine (NORVASC) tablet 5 mg  5 mg Oral Daily Rankin, Shuvon B, NP   5 mg at 03/24/22 1011   ARIPiprazole (ABILIFY) tablet 15 mg  15 mg Oral Daily Rosezetta Schlatter, MD   15 mg at 03/24/22 1013   docusate sodium (COLACE) capsule 100 mg  100 mg Oral Daily Rankin,  Shuvon B, NP   100 mg at 03/24/22 1011   hydrOXYzine (ATARAX) tablet 25 mg  25 mg Oral TID PRN Rankin, Shuvon B, NP   25 mg at 03/23/22 2120   magnesium hydroxide (MILK OF MAGNESIA) suspension 30 mL  30 mL Oral Daily PRN Rankin, Shuvon B, NP       pantoprazole (PROTONIX) EC tablet 40 mg  40 mg Oral Daily Rankin, Shuvon B, NP   40 mg at 03/24/22 1011   traZODone (DESYREL)  tablet 100 mg  100 mg Oral QHS Rankin, Shuvon B, NP   100 mg at 03/23/22 2119   traZODone (DESYREL) tablet 50 mg  50 mg Oral QHS PRN Rankin, Shuvon B, NP       valbenazine (INGREZZA) capsule 40 mg  40 mg Oral Daily Rankin, Shuvon B, NP   40 mg at 03/24/22 1011   Current Outpatient Medications  Medication Sig Dispense Refill   amphetamine-dextroamphetamine (ADDERALL) 5 MG tablet Take 5 mg by mouth 2 (two) times daily at 8am and 3pm.     fluPHENAZine (PROLIXIN) 5 MG tablet Take 5 mg by mouth in the morning and at bedtime.     traZODone (DESYREL) 100 MG tablet Take 1 tablet (100 mg total) by mouth at bedtime. 30 tablet 0   valbenazine (INGREZZA) 40 MG capsule Take 1 capsule (40 mg total) by mouth daily. 30 capsule 0    Labs  Lab Results:  Admission on 03/23/2022  Component Date Value Ref Range Status   SARS Coronavirus 2 by RT PCR 03/23/2022 NEGATIVE  NEGATIVE Final   Influenza A by PCR 03/23/2022 NEGATIVE  NEGATIVE Final   Influenza B by PCR 03/23/2022 NEGATIVE  NEGATIVE Final   Comment: (NOTE) The Xpert Xpress SARS-CoV-2/FLU/RSV plus assay is intended as an aid in the diagnosis of influenza from Nasopharyngeal swab specimens and should not be used as a sole basis for treatment. Nasal washings and aspirates are unacceptable for Xpert Xpress SARS-CoV-2/FLU/RSV testing.  Fact Sheet for Patients: EntrepreneurPulse.com.au  Fact Sheet for Healthcare Providers: IncredibleEmployment.be  This test is not yet approved or cleared by the [place] FDA and has been authorized for detection and/or diagnosis of SARS-CoV-2 by FDA under an Emergency Use Authorization (EUA). This EUA will remain in effect (meaning this test can be used) for the duration of the COVID-19 declaration under Section 564(b)(1) of the Act, 21 U.S.C. section 360bbb-3(b)(1), unless the authorization is terminated or revoked.     Resp Syncytial Virus by PCR 03/23/2022 NEGATIVE   NEGATIVE Final   Comment: (NOTE) Fact Sheet for Patients: EntrepreneurPulse.com.au  Fact Sheet for Healthcare Providers: IncredibleEmployment.be  This test is not yet approved or cleared by the [place] FDA and has been authorized for detection and/or diagnosis of SARS-CoV-2 by FDA under an Emergency Use Authorization (EUA). This EUA will remain in effect (meaning this test can be used) for the duration of the COVID-19 declaration under Section 564(b)(1) of the Act, 21 U.S.C. section 360bbb-3(b)(1), unless the authorization is terminated or revoked.  Performed at [facility], [place] [address], [place], [place] [address]    WBC 03/23/2022 4.4  4.0 - 10.5 K/uL Final   RBC 03/23/2022 5.09  4.22 - 5.81 MIL/uL Final   Hemoglobin 03/23/2022 15.9  13.0 - 17.0 g/dL Final   HCT 03/23/2022 46.9  39.0 - 52.0 % Final   MCV 03/23/2022 92.1  80.0 - 100.0 fL Final   MCH 03/23/2022 31.2  26.0 - 34.0 pg  Final   MCHC 03/23/2022 33.9  30.0 - 36.0 g/dL Final   RDW 03/23/2022 13.2  11.5 - 15.5 % Final   Platelets 03/23/2022 383  150 - 400 K/uL Final   nRBC 03/23/2022 0.0  0.0 - 0.2 % Final   Neutrophils Relative % 03/23/2022 43  % Final   Neutro Abs 03/23/2022 1.9  1.7 - 7.7 K/uL Final   Lymphocytes Relative 03/23/2022 42  % Final   Lymphs Abs 03/23/2022 1.8  0.7 - 4.0 K/uL Final   Monocytes Relative 03/23/2022 11  % Final   Monocytes Absolute 03/23/2022 0.5  0.1 - 1.0 K/uL Final   Eosinophils Relative 03/23/2022 3  % Final   Eosinophils Absolute 03/23/2022 0.1  0.0 - 0.5 K/uL Final   Basophils Relative 03/23/2022 1  % Final   Basophils Absolute 03/23/2022 0.1  0.0 - 0.1 K/uL Final   Immature Granulocytes 03/23/2022 0  % Final   Abs Immature Granulocytes 03/23/2022 0.01  0.00 - 0.07 K/uL Final   Performed at [facility], [place] [address], [place], [place] [address]   Sodium 03/23/2022 138  135 - 145 mmol/L Final   Potassium 03/23/2022 4.3  3.5 -  5.1 mmol/L Final   Chloride 03/23/2022 100  98 - 111 mmol/L Final   CO2 03/23/2022 22  22 - 32 mmol/L Final   Glucose, Bld 03/23/2022 62 (L)  70 - 99 mg/dL Final   Glucose reference range applies only to samples taken after fasting for at least 8 hours.   BUN 03/23/2022 6  6 - 20 mg/dL Final   Creatinine, Ser 03/23/2022 1.01  0.61 - 1.24 mg/dL Final   Calcium 03/23/2022 9.9  8.9 - 10.3 mg/dL Final   Total Protein 03/23/2022 7.1  6.5 - 8.1 g/dL Final   Albumin 03/23/2022 4.5  3.5 - 5.0 g/dL Final   AST 03/23/2022 18  15 - 41 U/L Final   ALT 03/23/2022 8  0 - 44 U/L Final   Alkaline Phosphatase 03/23/2022 50  38 - 126 U/L Final   Total Bilirubin 03/23/2022 0.1 (L)  0.3 - 1.2 mg/dL Final   GFR, Estimated 03/23/2022 >60  >60 mL/min Final   Comment: (NOTE) Calculated using the CKD-EPI Creatinine Equation (2021)    Anion gap 03/23/2022 16 (H)  5 - 15 Final   Performed at [facility] [address] [place], [place] [address]   Hgb A1c MFr Bld 03/23/2022 5.8 (H)  4.8 - 5.6 % Final   Comment: (NOTE)         Prediabetes: 5.7 - 6.4         Diabetes: >6.4         Glycemic control for adults with diabetes: <7.0    Mean Plasma Glucose 03/23/2022 120  mg/dL Final   Comment: (NOTE) Performed At: [facility] [place], [place] [id number] Rush Farmer MD [id number]    Magnesium 03/23/2022 2.2  1.7 - 2.4 mg/dL Final   Performed at [facility], [place] [address] [place], [place] [address]   Alcohol, Ethyl (B) 03/23/2022 <10  <10 mg/dL Final   Comment: (NOTE) Lowest detectable limit for serum alcohol is 10 mg/dL.  For medical purposes only. Performed at [facility], [place] [address], [place], [place] [address]    Cholesterol 03/23/2022 150  0 - 200 mg/dL Final   Triglycerides 03/23/2022 115  <150 mg/dL Final   HDL 03/23/2022 46  >40 mg/dL  Final   Total CHOL/HDL Ratio 03/23/2022 3.3  RATIO Final   VLDL 03/23/2022 23  0 - 40 mg/dL Final   LDL  Cholesterol 03/23/2022 81  0 - 99 mg/dL Final   Comment:        Total Cholesterol/HDL:CHD Risk Coronary Heart Disease Risk Table                     Men   Women  1/2 Average Risk   3.4   3.3  Average Risk       5.0   4.4  2 X Average Risk   9.6   7.1  3 X Average Risk  23.4   11.0        Use the calculated Patient Ratio above and the CHD Risk Table to determine the patient's CHD Risk.        ATP III CLASSIFICATION (LDL):  <100     mg/dL   Optimal  100-129  mg/dL   Near or Above                    Optimal  130-159  mg/dL   Borderline  160-189  mg/dL   High  >190     mg/dL   Very High Performed at [facility] [address] [place], [place] [address]    TSH 03/23/2022 2.083  0.350 - 4.500 uIU/mL Final   Comment: Performed by a 3rd Generation assay with a functional sensitivity of <=0.01 uIU/mL. Performed at [facility], [place] [address], [place], [place] [address]   Admission on 10/25/2021, Discharged on 11/04/2021  Component Date Value Ref Range Status   Hgb A1c MFr Bld 10/27/2021 5.7 (H)  4.8 - 5.6 % Final   Comment: (NOTE) Pre diabetes:          5.7%-6.4%  Diabetes:              >6.4%  Glycemic control for   <7.0% adults with diabetes    Mean Plasma Glucose 10/27/2021 116.89  mg/dL Final   Performed at [facility] [address], [place], [place] [address]  Admission on 10/25/2021, Discharged on 10/25/2021  Component Date Value Ref Range Status   WBC 10/25/2021 5.6  4.0 - 10.5 K/uL Final   RBC 10/25/2021 4.34  4.22 - 5.81 MIL/uL Final   Hemoglobin 10/25/2021 13.7  13.0 - 17.0 g/dL Final   HCT 10/25/2021 41.8  39.0 - 52.0 % Final   MCV 10/25/2021 96.3  80.0 - 100.0 fL Final   MCH 10/25/2021 31.6  26.0 - 34.0 pg Final   MCHC 10/25/2021 32.8  30.0 - 36.0 g/dL Final   RDW 10/25/2021 13.2  11.5 - 15.5 % Final   Platelets 10/25/2021 281  150 - 400 K/uL Final   nRBC 10/25/2021 0.0  0.0 - 0.2 % Final   Performed at [facility], [place]  [address], [place], [place] [address]   Sodium 10/25/2021 141  135 - 145 mmol/L Final   Potassium 10/25/2021 4.0  3.5 - 5.1 mmol/L Final   Chloride 10/25/2021 105  98 - 111 mmol/L Final   CO2 10/25/2021 21 (L)  22 - 32 mmol/L Final   Glucose, Bld 10/25/2021 76  70 - 99 mg/dL Final   Glucose reference range applies only to samples taken after fasting for at least 8 hours.   BUN 10/25/2021 8  6 - 20 mg/dL Final   Creatinine,  Ser 10/25/2021 0.84  0.61 - 1.24 mg/dL Final   Calcium 10/25/2021 10.2  8.9 - 10.3 mg/dL Final   Total Protein 10/25/2021 7.1  6.5 - 8.1 g/dL Final   Albumin 10/25/2021 4.5  3.5 - 5.0 g/dL Final   AST 10/25/2021 18  15 - 41 U/L Final   ALT 10/25/2021 12  0 - 44 U/L Final   Alkaline Phosphatase 10/25/2021 57  38 - 126 U/L Final   Total Bilirubin 10/25/2021 0.7  0.3 - 1.2 mg/dL Final   GFR, Estimated 10/25/2021 >60  >60 mL/min Final   Comment: (NOTE) Calculated using the CKD-EPI Creatinine Equation (2021)    Anion gap 10/25/2021 15  5 - 15 Final   Performed at [facility] [address], [place], [place] [address]   Opiates 10/25/2021 NONE DETECTED  NONE DETECTED Final   Cocaine 10/25/2021 NONE DETECTED  NONE DETECTED Final   Benzodiazepines 10/25/2021 NONE DETECTED  NONE DETECTED Final   Amphetamines 10/25/2021 POSITIVE (A)  NONE DETECTED Final   Tetrahydrocannabinol 10/25/2021 NONE DETECTED  NONE DETECTED Final   Barbiturates 10/25/2021 NONE DETECTED  NONE DETECTED Final   Comment: (NOTE) DRUG SCREEN FOR MEDICAL PURPOSES ONLY.  IF CONFIRMATION IS NEEDED FOR ANY PURPOSE, NOTIFY LAB WITHIN 5 DAYS.  LOWEST DETECTABLE LIMITS FOR URINE DRUG SCREEN Drug Class                     Cutoff (ng/mL) Amphetamine and metabolites    1000 Barbiturate and metabolites    200 Benzodiazepine                 200 Opiates and metabolites        300 Cocaine and metabolites        300 THC                            50 Performed at [facility], [place] [address],  [place], [place] [address]    Acetaminophen (Tylenol), Serum 10/25/2021 <10 (L)  10 - 30 ug/mL Final   Comment: (NOTE) Therapeutic concentrations vary significantly. A range of 10-30 ug/mL  may be an effective concentration for many patients. However, some  are best treated at concentrations outside of this range. Acetaminophen concentrations >150 ug/mL at 4 hours after ingestion  and >50 ug/mL at 12 hours after ingestion are often associated with  toxic reactions.  Performed at [facility], [place] [address] [place], [place] [id number]    Salicylate Lvl 123XX123 <7.0 (L)  7.0 - 30.0 mg/dL Final   Performed at [facility] [address] [place], [place] [address]   Alcohol, Ethyl (B) 10/25/2021 <10  <10 mg/dL Final   Comment: (NOTE) Lowest detectable limit for serum alcohol is 10 mg/dL.  For medical purposes only. Performed at [facility], [place] [address], [place], [place] [address]    SARS Coronavirus 2 by RT PCR 10/25/2021 NEGATIVE  NEGATIVE Final   Comment: (NOTE) SARS-CoV-2 target nucleic acids are NOT DETECTED.  The SARS-CoV-2 RNA is generally detectable in upper and lower respiratory specimens during the acute phase of infection. The lowest concentration of SARS-CoV-2 viral copies this assay can detect is 250 copies / mL. A negative result does not preclude SARS-CoV-2 infection and should not be used as the sole basis for treatment or other patient management decisions.  A negative result may occur with improper  specimen collection / handling, submission of specimen other than nasopharyngeal swab, presence of viral mutation(s) within the areas targeted by this assay, and inadequate number of viral copies (<250 copies / mL). A negative result must be combined with clinical observations, patient history, and epidemiological information.  Fact Sheet for Patients:   https://www.patel.info/  Fact Sheet for Healthcare  Providers: https://hall.com/  This test is not yet approved or                           cleared by the [place] FDA and has been authorized for detection and/or diagnosis of SARS-CoV-2 by FDA under an Emergency Use Authorization (EUA).  This EUA will remain in effect (meaning this test can be used) for the duration of the COVID-19 declaration under Section 564(b)(1) of the Act, 21 U.S.C. section 360bbb-3(b)(1), unless the authorization is terminated or revoked sooner.  Performed at [facility], [place] [address], [place], [place] [address]     Blood Alcohol level:  Lab Results  Component Value Date   [facility] <10 03/23/2022   ETH <10 123XX123    Metabolic Disorder Labs: Lab Results  Component Value Date   HGBA1C 5.8 (H) 03/23/2022   MPG 120 03/23/2022   MPG 116.89 10/27/2021   Lab Results  Component Value Date   PROLACTIN 19.9 (H) 09/06/2021   PROLACTIN 17.5 (H) 06/02/2021   Lab Results  Component Value Date   CHOL 150 03/23/2022   TRIG 115 03/23/2022   HDL 46 03/23/2022   CHOLHDL 3.3 03/23/2022   VLDL 23 03/23/2022   LDLCALC 81 03/23/2022   LDLCALC 80 06/02/2021    Therapeutic Lab Levels: No results found for: "LITHIUM" No results found for: "VALPROATE" No results found for: "CBMZ"  Physical Findings   AIMS    [place] Office Visit from 03/15/2022 in [facility] Admission (Discharged) from 10/25/2021 in [place] [facility] Admission (Discharged) from 09/03/2021 in [place] [facility] Admission (Discharged) from 06/01/2021 in [place] [facility] Admission (Discharged) from 02/02/2021 in [place] [facility]B  AIMS Total Score 0 0 0 0 2      AUDIT    Flowsheet Row Admission (Discharged) from 10/25/2021 in [place] [facility]B Admission (Discharged) from 09/03/2021 in  [place] [facility] Admission (Discharged) from 06/01/2021 in [place] [facility] Admission (Discharged) from 02/02/2021 in [place] [facility] Admission (Discharged) from 12/04/2018 in [facility] [facility]B  Alcohol Use Disorder Identification Test Final Score (AUDIT) 1 2 0 0 0      GAD-7    Flowsheet Row Office Visit from 03/15/2022 in [facility]  Total GAD-7 Score 9      PHQ2-9    [place] Office Visit from 03/15/2022 in [place]  PHQ-2 Total Score 2  PHQ-9 Total Score 17      Flowsheet Row ED from 03/23/2022 in [facility] Most recent reading at 03/23/2022  2:02 PM Admission (Discharged) from 10/25/2021 in [place] [facility]B Most recent reading at 10/25/2021  2:00 PM ED from 10/25/2021 in [facility] Emergency Department at [facility] Most recent reading at 10/25/2021  5:30 AM  C-SSRS RISK CATEGORY No Risk No Risk No Risk  Musculoskeletal  Strength & Muscle Tone: within normal limits Gait & Station: normal Patient leans: N/A  Psychiatric Specialty Exam  Presentation  General Appearance:  Appropriate for Environment  Eye Contact: Good  Speech: Clear and Coherent; Normal Rate  Speech Volume: Normal  Handedness: Right   Mood and Affect  Mood: Anxious; Dysphoric  Affect: Flat   Thought Process  Thought Processes: Coherent; Disorganized; Goal Directed  Descriptions of Associations:Circumstantial  Orientation:Partial (Oriented to self, time but not place)  Thought Content:Logical; Paranoid Ideation  Diagnosis of Schizophrenia or Schizoaffective disorder in past: Yes  Duration of Psychotic Symptoms: Greater than six months   Hallucinations:Hallucinations: Auditory Description of Auditory Hallucinations: Reports he is  hearing tapping, knocking, and kicking on the walls and doors of his home  Ideas of Reference:Paranoia (He feel that people are watching him, out to get him, spying and keeping things from him)  Suicidal Thoughts:Suicidal Thoughts: No  Homicidal Thoughts:Homicidal Thoughts: No   Sensorium  Memory: Immediate Fair; Recent Fair; Remote Fair  Judgment: Fair  Insight: Fair; Present   Executive Functions  Concentration: Good  Attention Span: Good  Recall: [place] of Knowledge: Good  Language: Good   Psychomotor Activity  Psychomotor Activity: Psychomotor Activity: Extrapyramidal Side Effects (EPS) Extrapyramidal Side Effects (EPS): Tardive Dyskinesia AIMS Completed?: No   Assets  Assets: Communication Skills; Desire for Improvement; Financial Resources/Insurance; Housing; Leisure Time; Physical Health; Resilience; Social Support; Transportation   Sleep  Sleep: Sleep: Poor   Nutritional Assessment (For OBS and FBC admissions only) Has the patient had a weight loss or gain of 10 pounds or more in the last 3 months?: No Has the patient had a decrease in food intake/or appetite?: Yes Does the patient have dental problems?: No Does the patient have eating habits or behaviors that may be indicators of an eating disorder including binging or inducing vomiting?: No Has the patient recently lost weight without trying?: 0 Has the patient been eating poorly because of a decreased appetite?: 0 (Reports difficulty eating related to the muscle contracting in throat making it hard to swallow) Malnutrition Screening Tool Score: 0    Physical Exam  Physical Exam Vitals and nursing note reviewed. Chaperone present: Father in waiting area.  Constitutional:      General: He is not in acute distress.    Appearance: Normal appearance. He is not ill-appearing.  HENT:     Head: Normocephalic.  Eyes:     Conjunctiva/sclera: Conjunctivae normal.  Cardiovascular:      Rate and Rhythm: Normal rate.  Pulmonary:     Effort: Pulmonary effort is normal. No respiratory distress.  Musculoskeletal:        General: Normal range of motion.     Cervical back: Normal range of motion.  Skin:    General: Skin is warm and dry.  Neurological:     General: No focal deficit present.     Mental Status: He is alert and oriented to person, place, and time.  Psychiatric:        Attention and Perception: He perceives auditory hallucinations.        Mood and Affect: Mood is anxious and depressed. Affect is flat.        Speech: Speech normal.        Behavior: Withdrawn: guarded. Behavior is cooperative.        Thought Content: Thought content is paranoid.        Cognition and Memory: Cognition normal.  Judgment: Judgment is impulsive.    Review of Systems  Psychiatric/Behavioral:  Positive for hallucinations (auditory hallucinations). Depression: dysphoric. Substance abuse: Denies.The patient is nervous/anxious and has insomnia (Reports he is not sleeping).   All other systems reviewed and are negative. Blood pressure 104/74, pulse 61, temperature 98.2 F (36.8 C), temperature source Tympanic, resp. rate 16, SpO2 99 %. There is no height or weight on file to calculate BMI.  Treatment Plan Summary: Daily contact with patient to assess and evaluate symptoms and progress in treatment and Medication management Tyrec Wride was admitted to [facility] continuous assessment unit for Schizoaffective disorder, bipolar type [facility]), crisis management, and stabilization. Routine labs ordered: All mostly WNL  Will maintain continuous observation for safety, until inpatient bed becomes available. Start on Abilify 15 mg PO daily.  Plan is to continue Abilify and if no adverse reaction start Abilify Maintena.  Will continue the Ingrezza for TD (movement of lips, jaw).  Was also given 2 mg IM of ativan yesterday for tightness and the difficulty swallowing.  Improved. If recurs, will start Ativan 2 mg Q 8 hours.  Trazodone restarted for insomnia.  Other psychotropic medications discontinued.    Inpatient psychiatric treatment recommended.  Pending [facility] acceptance. Patient under IVC. Adderall discontinued related to history of meth use and the feeling of euphoria/out of body experience patient has when taking it .      Rosezetta Schlatter, MD 03/24/2022 2:33 PM

## 2022-03-24 NOTE — ED Provider Notes (Incomplete)
FBC/OBS ASAP Discharge Summary  Date and Time: 03/24/2022 10:02 AM  Name: Wesley Peterson  MRN:  [id number]   Discharge Diagnoses:  Final diagnoses:  Schizoaffective disorder, bipolar type ([place])  Tardive dyskinesia  Auditory hallucination    Subjective: Wesley Peterson is a 37 yr. old male patient with history of Schizoaffective Disorder, Bipolar Type, Anxiety, Tardive Dyskinesia, Medication Noncompliance, and Methamphetamine Abuse.  He presented to [facility] outpatient for a face-to-face evaluation medication management and transferred to the [facility] for complaints of auditory, hallucinations and paranoia. He was IVC'd due to these symptoms and reluctance to seek care.   Stay Summary: Patient was admitted to the observation unit, where Abilify was started.   On day of discharge (per today's progress note: Patient is initially sleeping. He reports that his mood is "same as yesterday." He reports improvement to the feeling in his neck/throat after the dose of Ativan yesterday. He states that he feels ready to go home, but he is advised that he is under an involuntary commitment. We then discuss medications and the need to get his medications adjusted, to which he voices understanding. Patient denies SI, HI, and VH. He continues to endorse AH that "he tries to ignore" and refuses to disclose what is heard. He acknowledges that these AH are distressing to him. Patient denies paranoia, thought broadcasting, thought insertion/withdrawal, or receiving special messages through electronics.    He is advised that we are awaiting an inpatient bed for him, and although he does not like it, he voices understanding.   Total Time spent with patient: 30 minutes  Past Psychiatric History: See H&P Past Medical History: See chart Family History: See chart Family Psychiatric  History: See H&P Social History: See H&P Tobacco Cessation:  Prescription not provided because: Going inpatient  Current  Medications:  Current Facility-Administered Medications  Medication Dose Route Frequency Provider Last Rate Last Admin   acetaminophen (TYLENOL) tablet 650 mg  650 mg Oral Q6H PRN Rankin, Shuvon B, NP       alum & mag hydroxide-simeth (MAALOX/MYLANTA) 200-200-20 MG/5ML suspension 30 mL  30 mL Oral Q4H PRN Rankin, Shuvon B, NP       amLODipine (NORVASC) tablet 5 mg  5 mg Oral Daily Rankin, Shuvon B, NP   5 mg at 03/23/22 1520   docusate sodium (COLACE) capsule 100 mg  100 mg Oral Daily Rankin, Shuvon B, NP   100 mg at 03/23/22 1522   hydrOXYzine (ATARAX) tablet 25 mg  25 mg Oral TID PRN Rankin, Shuvon B, NP   25 mg at 03/23/22 2120   magnesium hydroxide (MILK OF MAGNESIA) suspension 30 mL  30 mL Oral Daily PRN Rankin, Shuvon B, NP       pantoprazole (PROTONIX) EC tablet 40 mg  40 mg Oral Daily Rankin, Shuvon B, NP   40 mg at 03/23/22 1521   traZODone (DESYREL) tablet 100 mg  100 mg Oral QHS Rankin, Shuvon B, NP   100 mg at 03/23/22 2119   traZODone (DESYREL) tablet 50 mg  50 mg Oral QHS PRN Rankin, Shuvon B, NP       valbenazine (INGREZZA) capsule 40 mg  40 mg Oral Daily Rankin, Shuvon B, NP   40 mg at 03/23/22 1522   Current Outpatient Medications  Medication Sig Dispense Refill   amphetamine-dextroamphetamine (ADDERALL) 5 MG tablet Take 5 mg by mouth 2 (two) times daily at 8am and 3pm.     fluPHENAZine (PROLIXIN) 5 MG tablet Take 5  mg by mouth in the morning and at bedtime.     traZODone (DESYREL) 100 MG tablet Take 1 tablet (100 mg total) by mouth at bedtime. 30 tablet 0   valbenazine (INGREZZA) 40 MG capsule Take 1 capsule (40 mg total) by mouth daily. 30 capsule 0    PTA Medications:  Facility Ordered Medications  Medication   acetaminophen (TYLENOL) tablet 650 mg   alum & mag hydroxide-simeth (MAALOX/MYLANTA) 200-200-20 MG/5ML suspension 30 mL   magnesium hydroxide (MILK OF MAGNESIA) suspension 30 mL   hydrOXYzine (ATARAX) tablet 25 mg   traZODone (DESYREL) tablet 50 mg    amLODipine (NORVASC) tablet 5 mg   docusate sodium (COLACE) capsule 100 mg   pantoprazole (PROTONIX) EC tablet 40 mg   [COMPLETED] LORazepam (ATIVAN) injection 2 mg   traZODone (DESYREL) tablet 100 mg   valbenazine (INGREZZA) capsule 40 mg   PTA Medications  Medication Sig   amphetamine-dextroamphetamine (ADDERALL) 5 MG tablet Take 5 mg by mouth 2 (two) times daily at 8am and 3pm.   traZODone (DESYREL) 100 MG tablet Take 1 tablet (100 mg total) by mouth at bedtime.   valbenazine (INGREZZA) 40 MG capsule Take 1 capsule (40 mg total) by mouth daily.       03/15/2022    3:34 PM  Depression screen PHQ 2/9  Decreased Interest 2  Down, Depressed, Hopeless 0  PHQ - 2 Score 2  Altered sleeping 3  Tired, decreased energy 3  Change in appetite 3  Feeling bad or failure about yourself  1  Trouble concentrating 3  Moving slowly or fidgety/restless 2  Suicidal thoughts 0  PHQ-9 Score 17  Difficult doing work/chores Somewhat difficult    Flowsheet Row ED from 03/23/2022 in [facility] Most recent reading at 03/23/2022  2:02 PM Admission (Discharged) from 10/25/2021 in [place] [facility] Most recent reading at 10/25/2021  2:00 PM ED from 10/25/2021 in [facility] Emergency Department at [facility] Most recent reading at 10/25/2021  5:30 AM  C-SSRS RISK CATEGORY No Risk No Risk No Risk       Musculoskeletal  Strength & Muscle Tone: within normal limits Gait & Station: normal Patient leans: N/A  Psychiatric Specialty Exam  Presentation  General Appearance:  Appropriate for Environment  Eye Contact: Good  Speech: Clear and Coherent; Normal Rate  Speech Volume: Normal  Handedness: Right   Mood and Affect  Mood: Anxious; Dysphoric  Affect: Flat   Thought Process  Thought Processes: Coherent; Disorganized; Goal Directed  Descriptions of Associations:Circumstantial  Orientation:Partial (Oriented to  self, time but not place)  Thought Content:Logical; Paranoid Ideation  Diagnosis of Schizophrenia or Schizoaffective disorder in past: Yes  Duration of Psychotic Symptoms: Greater than six months   Hallucinations:Hallucinations: Auditory Description of Auditory Hallucinations: Reports he is hearing tapping, knocking, and kicking on the walls and doors of his home  Ideas of Reference:Paranoia (He feel that people are watching him, out to get him, spying and keeping things from him)  Suicidal Thoughts:Suicidal Thoughts: No  Homicidal Thoughts:Homicidal Thoughts: No   Sensorium  Memory: Immediate Fair; Recent Fair; Remote Fair  Judgment: Fair  Insight: Fair; Present   Executive Functions  Concentration: Good  Attention Span: Good  Recall: [place] of Knowledge: Good  Language: Good   Psychomotor Activity  Psychomotor Activity: Psychomotor Activity: Extrapyramidal Side Effects (EPS) Extrapyramidal Side Effects (EPS): Tardive Dyskinesia AIMS Completed?: No   Assets  Assets: Communication Skills; Desire for  Improvement; Financial Resources/Insurance; Housing; Leisure Time; Physical Health; Resilience; Social Support; Transportation   Sleep  Sleep: Sleep: Poor   Nutritional Assessment (For OBS and FBC admissions only) Has the patient had a weight loss or gain of 10 pounds or more in the last 3 months?: No Has the patient had a decrease in food intake/or appetite?: Yes Does the patient have dental problems?: No Does the patient have eating habits or behaviors that may be indicators of an eating disorder including binging or inducing vomiting?: No Has the patient recently lost weight without trying?: 0 Has the patient been eating poorly because of a decreased appetite?: 0 (Reports difficulty eating related to the muscle contracting in throat making it hard to swallow) Malnutrition Screening Tool Score: 0    Physical Exam  Per today's progress  note. Physical Exam ROS Blood pressure 104/74, pulse 61, temperature 98.2 F (36.8 C), temperature source Tympanic, resp. rate 16, SpO2 99 %. There is no height or weight on file to calculate BMI.  Demographic Factors:  Male, Low socioeconomic status, Living alone, and Unemployed  Loss Factors: Financial problems/change in socioeconomic status  Historical Factors: Impulsivity  Risk Reduction Factors:   Sense of responsibility to family and Positive social support  Continued Clinical Symptoms:  Bipolar Disorder:   Bipolar II Schizophrenia:   Less than 8 years old Currently Psychotic Unstable or Poor Therapeutic Relationship Previous Psychiatric Diagnoses and Treatments  Cognitive Features That Contribute To Risk:  None    Suicide Risk:  Mild:  There are no identifiable plans, no associated intent, mild dysphoria and related symptoms, good self-control (both objective and subjective assessment), few other risk factors, and identifiable protective factors, including available and accessible social support.  Plan Of Care/Follow-up recommendations:  Follow-up recommendations:  Activity:  Normal, as tolerated Diet:  Per PCP recommendation  Patient is instructed prior to discharge to: Take all medications as prescribed by his mental healthcare provider. Report any adverse effects and/or reactions from the medicines to his outpatient provider promptly. Patient has been instructed & cautioned: To not engage in alcohol and or illegal drug use while on prescription medicines.  In the event of worsening symptoms, patient is instructed to call the crisis hotline at 988, 911 and or go to the nearest ED for appropriate evaluation and treatment of symptoms. To follow-up with his primary care provider for your other medical issues, concerns and or health care needs.   Disposition: [facility]  Rosezetta Schlatter, MD 03/24/2022, 10:02 AM

## 2022-03-24 NOTE — ED Notes (Signed)
Pt is in the bed resting. Respirations are even and unlabored. No acute distress noted.  Snacks given per pt's request. Will continue to monitor for safety.

## 2022-03-24 NOTE — ED Notes (Signed)
Pt sleeping in no acute distress. RR even and unlabored. Environment secured. Will continue to monitor for safety. 

## 2022-03-24 NOTE — ED Notes (Signed)
Patient A&Ox4. Denies intent to harm self/others when asked. Denies A/VH. Patient denies any physical complaints when asked. Pt guarded in nature and provide minimal responses to questions asked of staff. Medication compliant. Support and encouragement provided. Routine safety checks conducted according to facility protocol. Encouraged patient to notify staff if thoughts of harm toward self or others arise. Patient verbalize understanding and agreement. Will continue to monitor for safety.

## 2022-03-24 NOTE — Discharge Instructions (Signed)
Transfer to [facility] for IP psychiatric treatment. Dr.  Weber Cooks is accepting MD

## 2022-03-24 NOTE — ED Notes (Signed)
Pt sleeping at present, no distress noted.  Monitoring for safety. 

## 2022-03-25 ENCOUNTER — Inpatient Hospital Stay
Admission: AD | Admit: 2022-03-25 | Discharge: 2022-03-27 | DRG: 885 | Disposition: A | Discharge status: Home / Self Care | Payer: No Typology Code available for payment source | Source: Other Acute Inpatient Hospital | Attending: Psychiatry | Admitting: Psychiatry

## 2022-03-25 ENCOUNTER — Other Ambulatory Visit: Payer: Self-pay

## 2022-03-25 ENCOUNTER — Encounter: Payer: Self-pay | Admitting: Psychiatry

## 2022-03-25 DIAGNOSIS — Z79899 Other long term (current) drug therapy: Secondary | ICD-10-CM | POA: Diagnosis not present

## 2022-03-25 DIAGNOSIS — Z560 Unemployment, unspecified: Secondary | ICD-10-CM

## 2022-03-25 DIAGNOSIS — G2401 Drug induced subacute dyskinesia: Secondary | ICD-10-CM | POA: Diagnosis present

## 2022-03-25 DIAGNOSIS — F17290 Nicotine dependence, other tobacco product, uncomplicated: Secondary | ICD-10-CM | POA: Diagnosis present

## 2022-03-25 DIAGNOSIS — I10 Essential (primary) hypertension: Secondary | ICD-10-CM | POA: Diagnosis present

## 2022-03-25 DIAGNOSIS — Z91199 Patient's noncompliance with other medical treatment and regimen due to unspecified reason: Secondary | ICD-10-CM

## 2022-03-25 DIAGNOSIS — G4700 Insomnia, unspecified: Secondary | ICD-10-CM

## 2022-03-25 DIAGNOSIS — K219 Gastro-esophageal reflux disease without esophagitis: Secondary | ICD-10-CM | POA: Diagnosis present

## 2022-03-25 DIAGNOSIS — K5900 Constipation, unspecified: Secondary | ICD-10-CM | POA: Diagnosis present

## 2022-03-25 DIAGNOSIS — F250 Schizoaffective disorder, bipolar type: Secondary | ICD-10-CM | POA: Diagnosis present

## 2022-03-25 LAB — PROLACTIN: Prolactin: 3 ng/mL — ABNORMAL LOW (ref 3.9–22.7)

## 2022-03-25 LAB — POCT URINE DRUG SCREEN - MANUAL ENTRY (I-SCREEN)
POC Amphetamine UR: NOT DETECTED
POC Buprenorphine (BUP): NOT DETECTED
POC Cocaine UR: NOT DETECTED
POC Marijuana UR: POSITIVE — AB
POC Methadone UR: NOT DETECTED
POC Methamphetamine UR: NOT DETECTED
POC Morphine: NOT DETECTED
POC Oxazepam (BZO): POSITIVE — AB
POC Oxycodone UR: NOT DETECTED
POC Secobarbital (BAR): NOT DETECTED

## 2022-03-25 MED ORDER — DOCUSATE SODIUM 100 MG PO CAPS
100.0000 mg | ORAL_CAPSULE | Freq: Every day | ORAL | Status: DC
  Administered 2022-03-26 – 2022-03-27 (×2): 100 mg via ORAL
  Filled 2022-03-25 (×2): qty 1

## 2022-03-25 MED ORDER — PANTOPRAZOLE SODIUM 40 MG PO TBEC
40.0000 mg | DELAYED_RELEASE_TABLET | Freq: Every day | ORAL | Status: DC
  Administered 2022-03-26 – 2022-03-27 (×2): 40 mg via ORAL
  Filled 2022-03-25 (×2): qty 1

## 2022-03-25 MED ORDER — ARIPIPRAZOLE 5 MG PO TABS
15.0000 mg | ORAL_TABLET | Freq: Every day | ORAL | Status: DC
  Administered 2022-03-26 – 2022-03-27 (×2): 15 mg via ORAL
  Filled 2022-03-25 (×2): qty 1

## 2022-03-25 MED ORDER — ARIPIPRAZOLE 15 MG PO TABS: 15.0000 mg | ORAL_TABLET | Freq: Every day | ORAL | Status: DC

## 2022-03-25 MED ORDER — HYDROXYZINE HCL 25 MG PO TABS: 25.0000 mg | ORAL_TABLET | Freq: Three times a day (TID) | ORAL | Status: DC | PRN

## 2022-03-25 MED ORDER — DOCUSATE SODIUM 100 MG PO CAPS: 100.0000 mg | ORAL_CAPSULE | Freq: Every day | ORAL | 0 refills | Status: DC

## 2022-03-25 MED ORDER — HALOPERIDOL LACTATE 5 MG/ML IJ SOLN: 5.0000 mg | Freq: Three times a day (TID) | INTRAMUSCULAR | Status: DC | PRN

## 2022-03-25 MED ORDER — MAGNESIUM HYDROXIDE 400 MG/5ML PO SUSP: 30.0000 mL | Freq: Every day | ORAL | Status: DC | PRN

## 2022-03-25 MED ORDER — ACETAMINOPHEN 325 MG PO TABS: 650.0000 mg | ORAL_TABLET | Freq: Four times a day (QID) | ORAL | Status: DC | PRN

## 2022-03-25 MED ORDER — HYDROXYZINE HCL 25 MG PO TABS: 25.0000 mg | ORAL_TABLET | Freq: Three times a day (TID) | ORAL | 0 refills | Status: DC | PRN

## 2022-03-25 MED ORDER — TRAZODONE HCL 100 MG PO TABS
100.0000 mg | ORAL_TABLET | Freq: Every day | ORAL | Status: DC
  Filled 2022-03-25 (×2): qty 1

## 2022-03-25 MED ORDER — LORAZEPAM 2 MG PO TABS: 2.0000 mg | ORAL_TABLET | Freq: Three times a day (TID) | ORAL | Status: DC | PRN

## 2022-03-25 MED ORDER — ALUM & MAG HYDROXIDE-SIMETH 200-200-20 MG/5ML PO SUSP: 30.0000 mL | ORAL | Status: DC | PRN

## 2022-03-25 MED ORDER — ARIPIPRAZOLE ER 400 MG IM PRSY
400.0000 mg | PREFILLED_SYRINGE | INTRAMUSCULAR | Status: DC
  Administered 2022-03-25: 400 mg via INTRAMUSCULAR
  Filled 2022-03-25: qty 2

## 2022-03-25 MED ORDER — DIPHENHYDRAMINE HCL 50 MG/ML IJ SOLN: 50.0000 mg | Freq: Three times a day (TID) | INTRAMUSCULAR | Status: DC | PRN

## 2022-03-25 MED ORDER — HALOPERIDOL 5 MG PO TABS: 5.0000 mg | ORAL_TABLET | Freq: Three times a day (TID) | ORAL | Status: DC | PRN

## 2022-03-25 MED ORDER — DIPHENHYDRAMINE HCL 25 MG PO CAPS: 50.0000 mg | ORAL_CAPSULE | Freq: Three times a day (TID) | ORAL | Status: DC | PRN

## 2022-03-25 MED ORDER — VALBENAZINE TOSYLATE 40 MG PO CAPS
40.0000 mg | ORAL_CAPSULE | Freq: Every day | ORAL | Status: DC
  Administered 2022-03-26 – 2022-03-27 (×2): 40 mg via ORAL
  Filled 2022-03-25 (×2): qty 1

## 2022-03-25 MED ORDER — AMLODIPINE BESYLATE 5 MG PO TABS
5.0000 mg | ORAL_TABLET | Freq: Every day | ORAL | Status: DC
  Administered 2022-03-26 – 2022-03-27 (×2): 5 mg via ORAL
  Filled 2022-03-25 (×2): qty 1

## 2022-03-25 MED ORDER — AMLODIPINE BESYLATE 5 MG PO TABS: 5.0000 mg | ORAL_TABLET | Freq: Every day | ORAL | 0 refills | Status: DC

## 2022-03-25 MED ORDER — LORAZEPAM 2 MG/ML IJ SOLN: 2.0000 mg | Freq: Three times a day (TID) | INTRAMUSCULAR | Status: DC | PRN

## 2022-03-25 MED ORDER — PANTOPRAZOLE SODIUM 40 MG PO TBEC: 40.0000 mg | DELAYED_RELEASE_TABLET | Freq: Every day | ORAL | 0 refills | Status: DC
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
## 2022-03-25 NOTE — H&P (Signed)
Psychiatric Admission Assessment Adult  Patient Identification: Wesley Peterson MRN:  [id number] Date of Evaluation:  03/25/2022 Chief Complaint:  Schizoaffective disorder, bipolar type ([place]) [F25.0] Principal Diagnosis: Schizoaffective disorder, bipolar type ([place]) Diagnosis:  Principal Problem:   Schizoaffective disorder, bipolar type ([place]) Active Problems:   Essential hypertension, benign  History of Present Illness: Patient seen and chart reviewed.  37 year old man with a history of schizoaffective disorder.  Patient presented voluntarily to the outpatient clinic in [place].  By his report "I went to get a shot and they sent me here".  He said that he was going back to get his long-acting injection.  Documentation in the chart states that he was voicing having hallucinations both auditory voices as well as delusional paranoid thoughts that there was tapping on the wall and that his neighbors were doing it to bother him.  Patient did not report suicidal or homicidal thoughts and he denies those to me as well.  He states it has been about 2 months since he has been on his medication.  He recalls that he was on Prolixin long-acting injectable and has not gotten it in a couple months.  Denies that he has been taking any other medicine.  Patient says he drinks rarely and does not think it is a problem and denies other drugs.  Drug screen is positive for cannabis.  Patient was calm and cooperative in the interaction with me but flat and somewhat blunted.  Slow speech.  Tells me that he lives by himself spends most of his time at home. Associated Signs/Symptoms: Depression Symptoms:  difficulty concentrating, (Hypo) Manic Symptoms:   None reported Anxiety Symptoms:   None reported Psychotic Symptoms:  Delusions, Hallucinations: Auditory Paranoia, PTSD Symptoms: Negative Total Time spent with patient: 45 minutes  Past Psychiatric History: Patient has a documented history of schizoaffective disorder  and has had multiple prior hospitalizations in [place].  Patient has been on long-acting injectable medicine in the past but has a history of noncompliance often leading to return of symptoms.  There have been episodes of him voicing suicidal and homicidal thoughts in the past.  There have been episodes of reported fighting with family members.  I do not see any clear documentation of any suicide attempts and he denies any past suicidal or homicidal behavior.  Patient evidently had been prescribed Adderall in the past possibility was raised that this could have been worsening some of his symptoms.  The last prescription of Adderall was indeed filled just a few days ago.  Drug screen on presentation was negative for amphetamines  Is the patient at risk to self? No.  Has the patient been a risk to self in the past 6 months? Yes.    Has the patient been a risk to self within the distant past? Yes.    Is the patient a risk to others? No.  Has the patient been a risk to others in the past 6 months? Yes.    Has the patient been a risk to others within the distant past? Yes.     [place] Scale:  [facility] ED from 03/23/2022 in [facility] Most recent reading at 03/23/2022  2:02 PM Admission (Discharged) from 10/25/2021 in [facility] Most recent reading at 10/25/2021  2:00 PM ED from 10/25/2021 in [facility] Emergency Department at [facility] Most recent reading at 10/25/2021  5:30 AM  C-SSRS RISK CATEGORY No Risk No Risk No Risk  Prior Inpatient Therapy: Yes.   If yes, describe multiple hospitalizations in [place] Prior Outpatient Therapy: Yes.   If yes, describe follow-up usually at [facility] health.  Alcohol Screening:   Substance Abuse History in the last 12 months:  Yes.   Consequences of Substance Abuse: Cannabis may be involved Previous Psychotropic Medications: Yes  Psychological  Evaluations: Yes  Past Medical History:  Past Medical History:  Diagnosis Date   Schizophrenia ([place])    Schizophrenia, paranoid type ([place]) 02/02/2021   No past surgical history on file. Family History: No family history on file. Family Psychiatric  History: None reported Tobacco Screening:  Social History   Tobacco Use  Smoking Status Never   Passive exposure: Never  Smokeless Tobacco Current  Tobacco Comments   Vaping nicotine     BH Tobacco Counseling     Are you interested in Tobacco Cessation Medications?  No, patient refused Counseled patient on smoking cessation:  Refused/Declined practical counseling Reason Tobacco Screening Not Completed: No value filed.       Social History:  Social History   Substance and Sexual Activity  Alcohol Use Not Currently   Alcohol/week: 1.0 standard drink of alcohol   Types: 1 Cans of beer per week   Comment: occasional     Social History   Substance and Sexual Activity  Drug Use Never    Additional Social History:                           Allergies:  No Known Allergies Lab Results:  Results for orders placed or performed during the hospital encounter of 03/23/22 (from the past 48 hour(s))  POCT Urine Drug Screen - (I-Screen)     Status: Abnormal   Collection Time: 03/25/22 12:04 PM  Result Value Ref Range   POC Amphetamine UR None Detected NONE DETECTED (Cut Off Level 1000 ng/mL)   POC Secobarbital (BAR) None Detected NONE DETECTED (Cut Off Level 300 ng/mL)   POC Buprenorphine (BUP) None Detected NONE DETECTED (Cut Off Level 10 ng/mL)   POC Oxazepam (BZO) Positive (A) NONE DETECTED (Cut Off Level 300 ng/mL)   POC Cocaine UR None Detected NONE DETECTED (Cut Off Level 300 ng/mL)   POC Methamphetamine UR None Detected NONE DETECTED (Cut Off Level 1000 ng/mL)   POC Morphine None Detected NONE DETECTED (Cut Off Level 300 ng/mL)   POC Methadone UR None Detected NONE DETECTED (Cut Off Level 300 ng/mL)   POC Oxycodone  UR None Detected NONE DETECTED (Cut Off Level 100 ng/mL)   POC Marijuana UR Positive (A) NONE DETECTED (Cut Off Level 50 ng/mL)    Blood Alcohol level:  Lab Results  Component Value Date   ETH <10 03/23/2022   ETH <10 [id number]    Metabolic Disorder Labs:  Lab Results  Component Value Date   HGBA1C 5.8 (H) 03/23/2022   MPG 120 03/23/2022   MPG 116.89 10/27/2021   Lab Results  Component Value Date   PROLACTIN 3.0 (L) 03/23/2022   PROLACTIN 19.9 (H) 09/06/2021   Lab Results  Component Value Date   CHOL 150 03/23/2022   TRIG 115 03/23/2022   HDL 46 03/23/2022   CHOLHDL 3.3 03/23/2022   VLDL 23 03/23/2022   LDLCALC 81 03/23/2022   LDLCALC 80 06/02/2021    Current Medications: Current Facility-Administered Medications  Medication Dose Route Frequency Provider Last Rate Last Admin   acetaminophen (TYLENOL) tablet 650 mg  650 mg  Oral Q6H PRN Revonda Humphrey, NP       alum & mag hydroxide-simeth (MAALOX/MYLANTA) 200-200-20 MG/5ML suspension 30 mL  30 mL Oral Q4H PRN Revonda Humphrey, NP       [START ON 03/26/2022] amLODipine (NORVASC) tablet 5 mg  5 mg Oral Daily Revonda Humphrey, NP       [START ON 03/26/2022] ARIPiprazole (ABILIFY) tablet 15 mg  15 mg Oral Daily Revonda Humphrey, NP       ARIPiprazole ER (ABILIFY MAINTENA) 400 MG prefilled syringe 400 mg  400 mg Intramuscular Q28 days Bronislaus Verdell, Madie Reno, MD       diphenhydrAMINE (BENADRYL) capsule 50 mg  50 mg Oral TID PRN Revonda Humphrey, NP       Or   diphenhydrAMINE (BENADRYL) injection 50 mg  50 mg Intramuscular TID PRN Revonda Humphrey, NP       [START ON 03/26/2022] docusate sodium (COLACE) capsule 100 mg  100 mg Oral Daily Revonda Humphrey, NP       haloperidol (HALDOL) tablet 5 mg  5 mg Oral TID PRN Revonda Humphrey, NP       Or   haloperidol lactate (HALDOL) injection 5 mg  5 mg Intramuscular TID PRN Revonda Humphrey, NP       hydrOXYzine (ATARAX) tablet 25 mg  25 mg Oral TID PRN Revonda Humphrey,  NP       LORazepam (ATIVAN) tablet 2 mg  2 mg Oral TID PRN Revonda Humphrey, NP       Or   LORazepam (ATIVAN) injection 2 mg  2 mg Intramuscular TID PRN Revonda Humphrey, NP       magnesium hydroxide (MILK OF MAGNESIA) suspension 30 mL  30 mL Oral Daily PRN Revonda Humphrey, NP       [START ON 03/26/2022] pantoprazole (PROTONIX) EC tablet 40 mg  40 mg Oral Daily Revonda Humphrey, NP       traZODone (DESYREL) tablet 100 mg  100 mg Oral QHS Revonda Humphrey, NP       [START ON 03/26/2022] valbenazine (INGREZZA) capsule 40 mg  40 mg Oral Daily Revonda Humphrey, NP       PTA Medications: Medications Prior to Admission  Medication Sig Dispense Refill Last Dose   amLODipine (NORVASC) 5 MG tablet Take 1 tablet (5 mg total) by mouth daily. 30 tablet 0    ARIPiprazole (ABILIFY) 15 MG tablet Take 1 tablet (15 mg total) by mouth daily.      docusate sodium (COLACE) 100 MG capsule Take 1 capsule (100 mg total) by mouth daily. 30 capsule 0    hydrOXYzine (ATARAX) 25 MG tablet Take 1 tablet (25 mg total) by mouth 3 (three) times daily as needed for anxiety. 30 tablet 0    pantoprazole (PROTONIX) 40 MG tablet Take 1 tablet (40 mg total) by mouth daily. 30 tablet 0    traZODone (DESYREL) 100 MG tablet Take 1 tablet (100 mg total) by mouth at bedtime. 30 tablet 0    valbenazine (INGREZZA) 40 MG capsule Take 1 capsule (40 mg total) by mouth daily. 30 capsule 0     Musculoskeletal: Strength & Muscle Tone: within normal limits Gait & Station: normal Patient leans: N/A            Psychiatric Specialty Exam:  Presentation  General Appearance:  Appropriate for Environment  Eye Contact: Fair  Speech: Clear and Coherent; Normal Rate  Speech Volume:  Normal  Handedness: Right   Mood and Affect  Mood: Anxious  Affect: Congruent   Thought Process  Thought Processes: Coherent  Duration of Psychotic Symptoms: Chronic with a return over the past few weeks. Past Diagnosis  of Schizophrenia or Psychoactive disorder: Yes  Descriptions of Associations:Intact  Orientation:Full (Time, Place and Person)  Thought Content:Paranoid Ideation  Hallucinations:Hallucinations: Auditory Description of Auditory Hallucinations: states, "same as yesterda"  Ideas of Reference:Paranoia  Suicidal Thoughts:Suicidal Thoughts: No  Homicidal Thoughts:Homicidal Thoughts: No   Sensorium  Memory: Immediate Fair; Recent Fair; Remote Fair  Judgment: Fair  Insight: Fair   Materials engineer: Fair  Attention Span: Fair  Recall: [place] of Knowledge: Fair  Language: Good   Psychomotor Activity  Psychomotor Activity: Psychomotor Activity: Normal   Assets  Assets: Physical Health; Resilience; Social Support   Sleep  Sleep: Sleep: Fair    Physical Exam: Physical Exam Vitals and nursing note reviewed.  Constitutional:      Appearance: Normal appearance.  HENT:     Head: Normocephalic and atraumatic.     Mouth/Throat:     Pharynx: Oropharynx is clear.  Eyes:     Pupils: Pupils are equal, round, and reactive to light.  Cardiovascular:     Rate and Rhythm: Normal rate and regular rhythm.  Pulmonary:     Effort: Pulmonary effort is normal.     Breath sounds: Normal breath sounds.  Abdominal:     General: Abdomen is flat.     Palpations: Abdomen is soft.  Musculoskeletal:        General: Normal range of motion.  Skin:    General: Skin is warm and dry.  Neurological:     General: No focal deficit present.     Mental Status: He is alert. Mental status is at baseline.  Psychiatric:        Attention and Perception: He is inattentive. He perceives auditory hallucinations.        Mood and Affect: Mood is anxious. Affect is blunt.        Speech: Speech is delayed.        Behavior: Behavior is slowed.        Thought Content: Thought content normal.        Cognition and Memory: Memory is impaired.        Judgment: Judgment  normal.    Review of Systems  Constitutional: Negative.   HENT: Negative.    Eyes: Negative.   Respiratory: Negative.    Cardiovascular: Negative.   Gastrointestinal: Negative.   Musculoskeletal: Negative.   Skin: Negative.   Neurological: Negative.   Psychiatric/Behavioral:  Positive for hallucinations. The patient is nervous/anxious and has insomnia.    Blood pressure (!) 141/97, pulse 64, temperature 98.6 F (37 C), temperature source Oral, SpO2 100 %. There is no height or weight on file to calculate BMI.  Treatment Plan Summary: Medication management and Plan reviewed with patient the options for long-acting injectable medicine.  Advised him that Prolixin Decanoate generally is somewhat less well-tolerated and also needs to be dosed more frequently and that the Abilify that he was started at in [place] probably would be on the whole a better option if it works.  He is agreeable to a trial of Abilify maintainer and an injection of 400 mg will be provided today meanwhile continuing current dosing.  Daily assessment of symptoms and dangerousness.  Continued blood pressure medicine.  Labs reviewed.  Observation Level/Precautions:  15 minute checks  Laboratory:  Chemistry Profile  Psychotherapy:    Medications:    Consultations:    Discharge Concerns:    Estimated LOS:  Other:     Physician Treatment Plan for Primary Diagnosis: Schizoaffective disorder, bipolar type ([place]) Long Term Goal(s): Improvement in symptoms so as ready for discharge  Short Term Goals: Ability to verbalize feelings will improve and Ability to demonstrate self-control will improve  Physician Treatment Plan for Secondary Diagnosis: Principal Problem:   Schizoaffective disorder, bipolar type ([place]) Active Problems:   Essential hypertension, benign  Long Term Goal(s): Improvement in symptoms so as ready for discharge  Short Term Goals: Compliance with prescribed medications will improve  I certify that  inpatient services furnished can reasonably be expected to improve the patient's condition.    Alethia Berthold, MD 3/16/20242:17 PM

## 2022-03-25 NOTE — Plan of Care (Signed)
  Problem: Education: Goal: Knowledge of [place] [facility] Education information/materials will improve Outcome: Progressing Goal: Mental status will improve Outcome: Progressing   Problem: Activity: Goal: Will verbalize the importance of balancing activity with adequate rest periods Outcome: Progressing   Problem: Education: Goal: Will be free of psychotic symptoms Outcome: Progressing Goal: Knowledge of the prescribed therapeutic regimen will improve Outcome: Progressing

## 2022-03-25 NOTE — Group Note (Signed)
AA/NA Group     Group Date: 03/25/2022 Start Time: [id number] End Time: 1445     Type of Therapy and Topic:  Group Therapy:    Participation Level:  Did Not Attend   Description of Group: AA/NA providers held group with patients to address SUD.   Summary of Patient Progress:     X   Therapeutic Modalities:    Maryjane Hurter 03/25/2022  2:53 PM

## 2022-03-25 NOTE — Tx Team (Signed)
Initial Treatment Plan 03/25/2022 2:56 PM Wesley Peterson [id number]    PATIENT STRESSORS: Other: AH     PATIENT STRENGTHS: Capable of independent living  Financial means  Supportive family/friends    PATIENT IDENTIFIED PROBLEMS: AH                     DISCHARGE CRITERIA:  Improved stabilization in mood, thinking, and/or behavior Motivation to continue treatment in a less acute level of care Verbal commitment to aftercare and medication compliance  PRELIMINARY DISCHARGE PLAN: Outpatient therapy Return to previous living arrangement  PATIENT/FAMILY INVOLVEMENT: This treatment plan has been presented to and reviewed with the patient, Wesley Peterson.  The patient has been given the opportunity to ask questions and make suggestions.  Gerrianne Scale, RN 03/25/2022, 2:56 PM

## 2022-03-25 NOTE — [facility] Suicide Risk Assessment (Signed)
[facility] Admission Suicide Risk Assessment   Nursing information obtained from:    Demographic factors:    Current Mental Status:    Loss Factors:    Historical Factors:    Risk Reduction Factors:     Total Time spent with patient: 45 minutes Principal Problem: Schizoaffective disorder, bipolar type ([place]) Diagnosis:  Principal Problem:   Schizoaffective disorder, bipolar type ([place]) Active Problems:   Essential hypertension, benign  Subjective Data: Patient seen and chart reviewed.  37 year old man with a history of schizoaffective disorder was brought to the hospital in [place] after being seen in the outpatient clinic.  Return of auditory hallucinations.  Some paranoia.  Denies suicidal or homicidal ideation.  Has had a past history documented of suicidal statements but no known suicide attempts that I can find.  Denies any current suicidal thoughts.  Continued Clinical Symptoms:    The "Alcohol Use Disorders Identification Test", Guidelines for Use in Primary Care, Second Edition.  World Pharmacologist [facility]). Score between 0-7:  no or low risk or alcohol related problems. Score between 8-15:  moderate risk of alcohol related problems. Score between 16-19:  high risk of alcohol related problems. Score 20 or above:  warrants further diagnostic evaluation for alcohol dependence and treatment.   CLINICAL FACTORS:   Schizophrenia:   Paranoid or undifferentiated type   Musculoskeletal: Strength & Muscle Tone: within normal limits Gait & Station: normal Patient leans: N/A  Psychiatric Specialty Exam:  Presentation  General Appearance:  Appropriate for Environment  Eye Contact: Fair  Speech: Clear and Coherent; Normal Rate  Speech Volume: Normal  Handedness: Right   Mood and Affect  Mood: Anxious  Affect: Congruent   Thought Process  Thought Processes: Coherent  Descriptions of Associations:Intact  Orientation:Full (Time, Place and Person)  Thought  Content:Paranoid Ideation  History of Schizophrenia/Schizoaffective disorder:Yes  Duration of Psychotic Symptoms:Greater than six months  Hallucinations:Hallucinations: Auditory Description of Auditory Hallucinations: states, "same as yesterda"  Ideas of Reference:Paranoia  Suicidal Thoughts:Suicidal Thoughts: No  Homicidal Thoughts:Homicidal Thoughts: No   Sensorium  Memory: Immediate Fair; Recent Fair; Remote Fair  Judgment: Fair  Insight: Fair   Materials engineer: Fair  Attention Span: Fair  Recall: [place] of Knowledge: Fair  Language: Good   Psychomotor Activity  Psychomotor Activity: Psychomotor Activity: Normal   Assets  Assets: Physical Health; Resilience; Social Support   Sleep  Sleep: Sleep: Fair    Physical Exam: Physical Exam Vitals reviewed.  Constitutional:      Appearance: Normal appearance.  HENT:     Head: Normocephalic and atraumatic.     Mouth/Throat:     Pharynx: Oropharynx is clear.  Eyes:     Pupils: Pupils are equal, round, and reactive to light.  Cardiovascular:     Rate and Rhythm: Normal rate and regular rhythm.  Pulmonary:     Effort: Pulmonary effort is normal.     Breath sounds: Normal breath sounds.  Abdominal:     General: Abdomen is flat.     Palpations: Abdomen is soft.  Musculoskeletal:        General: Normal range of motion.  Skin:    General: Skin is warm and dry.  Neurological:     General: No focal deficit present.     Mental Status: He is alert. Mental status is at baseline.  Psychiatric:        Attention and Perception: He is inattentive. He perceives auditory hallucinations.  Mood and Affect: Mood normal. Affect is blunt.        Speech: Speech is delayed.        Behavior: Behavior is slowed.        Thought Content: Thought content normal. Thought content does not include homicidal or suicidal ideation.        Cognition and Memory: Memory is impaired.         Judgment: Judgment normal.    Review of Systems  Constitutional: Negative.   HENT: Negative.    Eyes: Negative.   Respiratory: Negative.    Cardiovascular: Negative.   Gastrointestinal: Negative.   Musculoskeletal: Negative.   Skin: Negative.   Neurological: Negative.   Psychiatric/Behavioral:  Positive for hallucinations. The patient is nervous/anxious and has insomnia.    Blood pressure (!) 141/97, pulse 64, temperature 98.6 F (37 C), temperature source Oral, SpO2 100 %. There is no height or weight on file to calculate BMI.   COGNITIVE FEATURES THAT CONTRIBUTE TO RISK:  Thought constriction (tunnel vision)    SUICIDE RISK:   Minimal: No identifiable suicidal ideation.  Patients presenting with no risk factors but with morbid ruminations; may be classified as minimal risk based on the severity of the depressive symptoms  PLAN OF CARE: Restart antipsychotic medication particularly aiming to get him back on long-acting injectable therapy.  Engage in individual and group therapy.  Labs reviewed.  Reassess daily for dangerousness prior to discharge planning  I certify that inpatient services furnished can reasonably be expected to improve the patient's condition.   Alethia Berthold, MD 03/25/2022, 2:06 PM

## 2022-03-25 NOTE — Progress Notes (Signed)
This CSW received notification on 03/24/22 that pt meets inpatient behavioral health placement per Rosezetta Schlatter, MD. Per [place] on 03/24/22 evening [place], RN there were no available beds within the [facility] system and CSW sent to out of network providers. [facility] AC informed that pt would be under review and morning [facility] AC to accept PENDING Discharges.  Currently pt is is under review at [facility] by [place], RN. Providing Thomes Lolling, NP advices pt still meets inpatient behavioral health as of 03/25/22. Per morning meeting at the request of Dr. Dwyane Dee for pt to remain in network so pt can access local resources. CSW and Disposition team will assist and follow with placement.   Benjaman Kindler, MSW, [facility] 03/25/2022 9:29 AM

## 2022-03-25 NOTE — Progress Notes (Signed)
Pt was accepted to [facility] 03/25/22; Bed Assignment 306  Dx schizoaffective d/o  Pt meets inpatient criteria per Thomes Lolling, NP  Attending Physician will be Alethia Berthold, MD  Report can be called to: [phone]   Pt can arrive after [place] and [facility] Charge RN will coordinate with the care team.  Per [facility], Provider, please include admission orders and agitation protocol. Registration, please pre-admit.   Care Team notified: [facility] AC: Oris Drone, RN, Thomes Lolling, NP, Drema Halon, RN, Alethia Berthold, MD, Wilnette Kales, NT, Shelton Silvas, RN, Dayna Ramus, RN, Ernst Breach, RN  Nadara Mode, LCSWA 03/25/2022 @ 9:55 AM

## 2022-03-25 NOTE — ED Notes (Signed)
[place] notified of need to transfer to [facility].  HIPAA Compliant Voice mail left.

## 2022-03-25 NOTE — ED Notes (Signed)
Pt is in the bed sleeping. Respirations are even and unlabored. No acute distress noted. Will continue to monitor for safety. 

## 2022-03-25 NOTE — ED Notes (Signed)
Pt resting quietly, breathing even and unlabored.

## 2022-03-25 NOTE — ED Notes (Signed)
Report called to Risk manager at [facility].   Verbalized understanding.

## 2022-03-25 NOTE — Group Note (Signed)
[place] LCSW Group Therapy Note   Group Date: 03/25/2022 Start Time: [id number] End Time: 1411   Type of Therapy/Topic:  Group Therapy:  Emotion Regulation  Participation Level:  Did Not Attend   Mood:  Description of Group:    The purpose of this group is to assist patients in learning to regulate negative emotions and experience positive emotions. Patients will be guided to discuss ways in which they have been vulnerable to their negative emotions. These vulnerabilities will be juxtaposed with experiences of positive emotions or situations, and patients challenged to use positive emotions to combat negative ones. Special emphasis will be placed on coping with negative emotions in conflict situations, and patients will process healthy conflict resolution skills.  Therapeutic Goals: Patient will identify two positive emotions or experiences to reflect on in order to balance out negative emotions:  Patient will label two or more emotions that they find the most difficult to experience:  Patient will be able to demonstrate positive conflict resolution skills through discussion or role plays:   Summary of Patient Progress:   X    Therapeutic Modalities:   Cognitive Behavioral Therapy Feelings Identification Dialectical Behavioral Therapy   Rozann Lesches, LCSW

## 2022-03-25 NOTE — ED Notes (Signed)
Pt resting quietly, breathing even and unlabored.  Pt is in view of nursing station and being monitored.  Awaiting provider's eval.  No distress noted.

## 2022-03-25 NOTE — Progress Notes (Signed)
Admissions note: Pt stated that everything happened all out of nowhere. Pt stated he went to [facility] to get his shot, told them he was hearing voices, and they admitted him. Pt stated he had been doing "good as hell" on his medications up until 2 weeks ago when the voices came back. Pt was hesitant to say what the voices were saying but did say that they were not telling him to hurt himself or others. Pt stated at one point the voices were saying sexual things and at another point were saying "got 'em."  Pt stated he is living in an apartment in [place] by himself and that he will ride with his dad in his 18-wheeler on jobs sometimes. Pt would smile and laugh from time to time. Pt denies VH/HI/SI. Consents signed, handbook detailing the patient's rights, responsibilities, and visitor guidelines provided. Skin/belongings search completed and patient oriented to unit. Patient stable at this time. Patient given the opportunity to express concerns and ask questions. Patient given toiletries. RN will continue to monitor.   03/25/22 1251  Psych Admission Type (Psych Patients Only)  Admission Status Involuntary  Psychosocial Assessment  Patient Complaints Decreased concentration;Appetite decrease;Isolation  Eye Contact Fair  Facial Expression Flat;Blank  Affect Blunted;Flat  Speech Logical/coherent;Soft  Interaction Forwards little;Minimal  Motor Activity Slow  Appearance/Hygiene In scrubs;Unremarkable  Behavior Characteristics Cooperative;Appropriate to situation;Calm  Mood Pleasant  Aggressive Behavior  Effect No apparent injury  Thought Process  Coherency WDL  Content WDL  Delusions None reported or observed  Perception Hallucinations  Hallucination Auditory  Judgment Impaired  Confusion None  Danger to Self  Current suicidal ideation? Denies  Danger to Others  Danger to Others None reported or observed

## 2022-03-25 NOTE — ED Notes (Signed)
Pt is in the bed sleeping. Respirations are even and unlabored. No acute distress noted. Will continue to monitor for safet  

## 2022-03-26 ENCOUNTER — Other Ambulatory Visit: Payer: Self-pay

## 2022-03-26 MED ORDER — HYDROXYZINE HCL 25 MG PO TABS: 25.0000 mg | ORAL_TABLET | Freq: Three times a day (TID) | ORAL | 1 refills | Status: DC | PRN

## 2022-03-26 MED ORDER — ARIPIPRAZOLE 15 MG PO TABS
15.0000 mg | ORAL_TABLET | Freq: Every day | ORAL | 0 refills | Status: AC
  Filled 2022-03-26: qty 15, 15d supply, fill #0

## 2022-03-26 MED ORDER — VALBENAZINE TOSYLATE 40 MG PO CAPS: 40.0000 mg | ORAL_CAPSULE | Freq: Every day | ORAL | 1 refills | Status: DC

## 2022-03-26 MED ORDER — AMLODIPINE BESYLATE 5 MG PO TABS
5.0000 mg | ORAL_TABLET | Freq: Every day | ORAL | 0 refills | Status: AC
  Filled 2022-03-26: qty 30, 30d supply, fill #0

## 2022-03-26 MED ORDER — PANTOPRAZOLE SODIUM 40 MG PO TBEC
40.0000 mg | DELAYED_RELEASE_TABLET | Freq: Every day | ORAL | 0 refills | Status: AC
  Filled 2022-03-26: qty 30, 30d supply, fill #0

## 2022-03-26 MED ORDER — PANTOPRAZOLE SODIUM 40 MG PO TBEC: 40.0000 mg | DELAYED_RELEASE_TABLET | Freq: Every day | ORAL | 1 refills | Status: DC

## 2022-03-26 MED ORDER — ARIPIPRAZOLE ER 400 MG IM PRSY: 400.0000 mg | PREFILLED_SYRINGE | INTRAMUSCULAR | 1 refills | Status: DC

## 2022-03-26 MED ORDER — AMLODIPINE BESYLATE 5 MG PO TABS: 5.0000 mg | ORAL_TABLET | Freq: Every day | ORAL | 1 refills | Status: DC

## 2022-03-26 MED ORDER — TRAZODONE HCL 100 MG PO TABS: 100.0000 mg | ORAL_TABLET | Freq: Every day | ORAL | 1 refills | Status: DC

## 2022-03-26 MED ORDER — DOCUSATE SODIUM 100 MG PO CAPS
100.0000 mg | ORAL_CAPSULE | Freq: Every day | ORAL | 0 refills | Status: AC
  Filled 2022-03-26: qty 30, 30d supply, fill #0

## 2022-03-26 MED ORDER — HYDROXYZINE HCL 25 MG PO TABS
25.0000 mg | ORAL_TABLET | Freq: Three times a day (TID) | ORAL | 0 refills | Status: DC | PRN
  Filled 2022-03-26: qty 20, 7d supply, fill #0

## 2022-03-26 MED ORDER — TRAZODONE HCL 100 MG PO TABS
100.0000 mg | ORAL_TABLET | Freq: Every day | ORAL | 0 refills | Status: DC
  Filled 2022-03-26: qty 30, 30d supply, fill #0

## 2022-03-26 NOTE — Plan of Care (Signed)
Patient stays in bed most of the shift. Minimal interactions with staff & peers. Patient denies SI,HI and AVH. Appetite and energy level good. Compliant with medications. Support and encouragement given.

## 2022-03-26 NOTE — Progress Notes (Signed)
[facility] MD Progress Note  03/26/2022 12:56 PM Estee Ertel  MRN:  [id number] Subjective: Patient seen for follow-up.  No new complaints.  Mostly stays in bed but does come out and interact a little bit.  Denies suicidal or homicidal thoughts.  Denies active hallucinations.  Had his injection of Abilify yesterday and appears to have tolerated it fine. Principal Problem: Schizoaffective disorder, bipolar type (Wesley Peterson) Diagnosis: Principal Problem:   Schizoaffective disorder, bipolar type (Wesley Peterson) Active Problems:   Essential hypertension, benign  Total Time spent with patient: 30 minutes  Past Psychiatric History: Past history of schizoaffective disorder  Past Medical History:  Past Medical History:  Diagnosis Date   Schizophrenia ([place])    Schizophrenia, paranoid type ([place]) 02/02/2021   History reviewed. No pertinent surgical history. Family History: History reviewed. No pertinent family history. Family Psychiatric  History: See previous Social History:  Social History   Substance and Sexual Activity  Alcohol Use Not Currently   Alcohol/week: 1.0 standard drink of alcohol   Types: 1 Cans of beer per week   Comment: occasional     Social History   Substance and Sexual Activity  Drug Use Not Currently   Types: Marijuana    Social History   Socioeconomic History   Marital status: Single    Spouse name: Not on file   Number of children: 1   Years of education: Not on file   Highest education level: Not on file  Occupational History   Occupation: Unemployed  Tobacco Use   Smoking status: Never    Passive exposure: Never   Smokeless tobacco: Former   Tobacco comments:    Vaping nicotine   Vaping Use   Vaping Use: Some days   Start date: 02/01/2020   Substances: Nicotine  Substance and Sexual Activity   Alcohol use: Not Currently    Alcohol/week: 1.0 standard drink of alcohol    Types: 1 Cans of beer per week    Comment: occasional   Drug use: Not Currently    Types:  Marijuana   Sexual activity: Yes    Birth control/protection: Condom  Other Topics Concern   Not on file  Social History Narrative   ** Merged History Encounter **       Pt lives with mother and other relatives.  Currently unemployed.  [place] outpatient psychiatry services through [facility].   Social Determinants of Health   Financial Resource Strain: Not on file  Food Insecurity: No Food Insecurity (03/25/2022)   Hunger Vital Sign    Worried About Running Out of Food in the Last Year: Never true    Ran Out of Food in the Last Year: Never true  Transportation Needs: No Transportation Needs (03/25/2022)   PRAPARE - Hydrologist (Medical): No    Lack of Transportation (Non-Medical): No  Physical Activity: Not on file  Stress: Not on file  Social Connections: Not on file   Additional Social History:                         Sleep: Fair  Appetite:  Fair  Current Medications: Current Facility-Administered Medications  Medication Dose Route Frequency Provider Last Rate Last Admin   acetaminophen (TYLENOL) tablet 650 mg  650 mg Oral Q6H PRN Revonda Humphrey, NP       alum & mag hydroxide-simeth (MAALOX/MYLANTA) 200-200-20 MG/5ML suspension 30 mL  30 mL Oral Q4H PRN Revonda Humphrey, NP  amLODipine (NORVASC) tablet 5 mg  5 mg Oral Daily Revonda Humphrey, NP   5 mg at 03/26/22 0845   ARIPiprazole (ABILIFY) tablet 15 mg  15 mg Oral Daily Revonda Humphrey, NP   15 mg at 03/26/22 0845   ARIPiprazole ER (ABILIFY MAINTENA) 400 MG prefilled syringe 400 mg  400 mg Intramuscular Q28 days Violia Knopf, Madie Reno, MD   400 mg at 03/25/22 1723   diphenhydrAMINE (BENADRYL) capsule 50 mg  50 mg Oral TID PRN Revonda Humphrey, NP       Or   diphenhydrAMINE (BENADRYL) injection 50 mg  50 mg Intramuscular TID PRN Revonda Humphrey, NP       docusate sodium (COLACE) capsule 100 mg  100 mg Oral Daily Revonda Humphrey, NP   100 mg at 03/26/22 0845    haloperidol (HALDOL) tablet 5 mg  5 mg Oral TID PRN Revonda Humphrey, NP       Or   haloperidol lactate (HALDOL) injection 5 mg  5 mg Intramuscular TID PRN Revonda Humphrey, NP       hydrOXYzine (ATARAX) tablet 25 mg  25 mg Oral TID PRN Revonda Humphrey, NP       LORazepam (ATIVAN) tablet 2 mg  2 mg Oral TID PRN Revonda Humphrey, NP       Or   LORazepam (ATIVAN) injection 2 mg  2 mg Intramuscular TID PRN Revonda Humphrey, NP       magnesium hydroxide (MILK OF MAGNESIA) suspension 30 mL  30 mL Oral Daily PRN Revonda Humphrey, NP       pantoprazole (PROTONIX) EC tablet 40 mg  40 mg Oral Daily Revonda Humphrey, NP   40 mg at 03/26/22 0845   traZODone (DESYREL) tablet 100 mg  100 mg Oral QHS Revonda Humphrey, NP       valbenazine [facility]) capsule 40 mg  40 mg Oral Daily Revonda Humphrey, NP   40 mg at 03/26/22 [id number]    Lab Results:  Results for orders placed or performed during the hospital encounter of 03/23/22 (from the past 48 hour(s))  POCT Urine Drug Screen - (I-Screen)     Status: Abnormal   Collection Time: 03/25/22 12:04 PM  Result Value Ref Range   POC Amphetamine UR None Detected NONE DETECTED (Cut Off Level 1000 ng/mL)   POC Secobarbital (BAR) None Detected NONE DETECTED (Cut Off Level 300 ng/mL)   POC Buprenorphine (BUP) None Detected NONE DETECTED (Cut Off Level 10 ng/mL)   POC Oxazepam (BZO) Positive (A) NONE DETECTED (Cut Off Level 300 ng/mL)   POC Cocaine UR None Detected NONE DETECTED (Cut Off Level 300 ng/mL)   POC Methamphetamine UR None Detected NONE DETECTED (Cut Off Level 1000 ng/mL)   POC Morphine None Detected NONE DETECTED (Cut Off Level 300 ng/mL)   POC Methadone UR None Detected NONE DETECTED (Cut Off Level 300 ng/mL)   POC Oxycodone UR None Detected NONE DETECTED (Cut Off Level 100 ng/mL)   POC Marijuana UR Positive (A) NONE DETECTED (Cut Off Level 50 ng/mL)    Blood Alcohol level:  Lab Results  Component Value Date   [facility] <10 03/23/2022    ETH <10 [id number]    Metabolic Disorder Labs: Lab Results  Component Value Date   HGBA1C 5.8 (H) 03/23/2022   MPG 120 03/23/2022   MPG 116.89 10/27/2021   Lab Results  Component Value Date   PROLACTIN 3.0 (L) 03/23/2022  PROLACTIN 19.9 (H) 09/06/2021   Lab Results  Component Value Date   CHOL 150 03/23/2022   TRIG 115 03/23/2022   HDL 46 03/23/2022   CHOLHDL 3.3 03/23/2022   VLDL 23 03/23/2022   LDLCALC 81 03/23/2022   LDLCALC 80 06/02/2021    Physical Findings: AIMS: Facial and Oral Movements Muscles of Facial Expression: None, normal Lips and Perioral Area: None, normal Jaw: None, normal Tongue: None, normal,Extremity Movements Upper (arms, wrists, hands, fingers): None, normal Lower (legs, knees, ankles, toes): None, normal, Trunk Movements Neck, shoulders, hips: None, normal, Overall Severity Severity of abnormal movements (highest score from questions above): None, normal Incapacitation due to abnormal movements: None, normal Patient's awareness of abnormal movements (rate only patient's report): No Awareness, Dental Status Current problems with teeth and/or dentures?: No Does patient usually wear dentures?: No  CIWA:    COWS:     Musculoskeletal: Strength & Muscle Tone: within normal limits Gait & Station: normal Patient leans: N/A  Psychiatric Specialty Exam:  Presentation  General Appearance:  Appropriate for Environment  Eye Contact: Fair  Speech: Clear and Coherent; Normal Rate  Speech Volume: Normal  Handedness: Right   Mood and Affect  Mood: Anxious  Affect: Congruent   Thought Process  Thought Processes: Coherent  Descriptions of Associations:Intact  Orientation:Full (Time, Place and Person)  Thought Content:Paranoid Ideation  History of Schizophrenia/Schizoaffective disorder:Yes  Duration of Psychotic Symptoms:Greater than six months  Hallucinations:Hallucinations: Auditory Description of Auditory  Hallucinations: states, "same as yesterda"  Ideas of Reference:Paranoia  Suicidal Thoughts:Suicidal Thoughts: No  Homicidal Thoughts:Homicidal Thoughts: No   Sensorium  Memory: Immediate Fair; Recent Fair; Remote Fair  Judgment: Fair  Insight: Fair   Materials engineer: Fair  Attention Span: Fair  Recall: [place] of Knowledge: Fair  Language: Good   Psychomotor Activity  Psychomotor Activity: Psychomotor Activity: Normal   Assets  Assets: Physical Health; Resilience; Social Support   Sleep  Sleep: Sleep: Fair    Physical Exam: Physical Exam Vitals and nursing note reviewed.  Constitutional:      Appearance: Normal appearance.  HENT:     Head: Normocephalic and atraumatic.     Mouth/Throat:     Pharynx: Oropharynx is clear.  Eyes:     Pupils: Pupils are equal, round, and reactive to light.  Cardiovascular:     Rate and Rhythm: Normal rate and regular rhythm.  Pulmonary:     Effort: Pulmonary effort is normal.     Breath sounds: Normal breath sounds.  Abdominal:     General: Abdomen is flat.     Palpations: Abdomen is soft.  Musculoskeletal:        General: Normal range of motion.  Skin:    General: Skin is warm and dry.  Neurological:     General: No focal deficit present.     Mental Status: He is alert. Mental status is at baseline.  Psychiatric:        Attention and Perception: He is inattentive.        Mood and Affect: Mood normal. Affect is blunt.        Speech: Speech is delayed.        Behavior: Behavior is slowed.        Thought Content: Thought content normal.    Review of Systems  Constitutional: Negative.   HENT: Negative.    Eyes: Negative.   Respiratory: Negative.    Cardiovascular: Negative.   Gastrointestinal: Negative.   Musculoskeletal: Negative.  Skin: Negative.   Neurological: Negative.   Psychiatric/Behavioral: Negative.     Blood pressure (!) 141/97, pulse 64, temperature 98.6 F  (37 C), temperature source Oral, resp. rate 17, height 6\' 1"  (1.854 m), weight 60.1 kg, SpO2 100 %. Body mass index is 17.48 kg/m.   Treatment Plan Summary: Plan no change to medication orders.  Encourage patient to be up out of his room and interactive.  I am going to review the chart we may consider possibly discharge as early as tomorrow.  Alethia Berthold, MD 03/26/2022, 12:56 PM

## 2022-03-26 NOTE — Progress Notes (Signed)
   03/26/22 2200  Psych Admission Type (Psych Patients Only)  Admission Status Involuntary  Psychosocial Assessment  Patient Complaints Decreased concentration  Eye Contact Fair  Facial Expression Flat  Affect Blunted  Speech Soft  Interaction Isolative  Motor Activity Slow  Appearance/Hygiene Unremarkable  Behavior Characteristics Cooperative  Mood Depressed  Thought Process  Coherency WDL  Content WDL  Delusions None reported or observed  Perception WDL  Hallucination None reported or observed  Judgment Impaired  Confusion None  Danger to Self  Current suicidal ideation? Denies  Danger to Others  Danger to Others None reported or observed

## 2022-03-26 NOTE — Progress Notes (Signed)
Pt slept majority of shift.  Pt refused PM Trazadone.  Pt denies SI Hi AVH.    03/26/22 0000  Psych Admission Type (Psych Patients Only)  Admission Status Involuntary  Psychosocial Assessment  Patient Complaints Decreased concentration  Eye Contact Fair  Facial Expression Flat  Affect Blunted  Speech Soft  Interaction Isolative  Motor Activity Slow  Appearance/Hygiene Unremarkable  Behavior Characteristics Cooperative  Mood Depressed  Aggressive Behavior  Effect No apparent injury  Thought Process  Coherency WDL  Content WDL  Delusions None reported or observed  Perception WDL  Hallucination None reported or observed  Judgment Impaired  Confusion None  Danger to Self  Current suicidal ideation? Denies  Danger to Others  Danger to Others None reported or observed

## 2022-03-26 NOTE — BHH Group Notes (Signed)
[place] Group Notes:  (Nursing/MHT/Case Management/Adjunct)  Date:  03/26/2022  Time:  9:00 PM  Type of Therapy:   Wrap up  Participation Level:  Did Not Attend   Wesley Peterson 03/26/2022, 9:00 PM

## 2022-03-27 ENCOUNTER — Other Ambulatory Visit: Payer: Self-pay

## 2022-03-27 NOTE — BHH Suicide Risk Assessment (Signed)
[facility] Discharge Suicide Risk Assessment   Principal Problem: Schizoaffective disorder, bipolar type ([place]) Discharge Diagnoses: Principal Problem:   Schizoaffective disorder, bipolar type ([place]) Active Problems:   Essential hypertension, benign   Total Time spent with patient: 30 minutes  Musculoskeletal: Strength & Muscle Tone: within normal limits Gait & Station: normal Patient leans: N/A  Psychiatric Specialty Exam  Presentation  General Appearance:  Appropriate for Environment  Eye Contact: Fair  Speech: Clear and Coherent; Normal Rate  Speech Volume: Normal  Handedness: Right   Mood and Affect  Mood: Anxious  Duration of Depression Symptoms: Greater than two weeks  Affect: Congruent   Thought Process  Thought Processes: Coherent  Descriptions of Associations:Intact  Orientation:Full (Time, Place and Person)  Thought Content:Paranoid Ideation  History of Schizophrenia/Schizoaffective disorder:Yes  Duration of Psychotic Symptoms:Greater than six months  Hallucinations:No data recorded Ideas of Reference:Paranoia  Suicidal Thoughts:No data recorded Homicidal Thoughts:No data recorded  Sensorium  Memory: Immediate Fair; Recent Fair; Remote Fair  Judgment: Fair  Insight: Fair   Community education officer  Concentration: Fair  Attention Span: Fair  Recall: [place] of Knowledge: Fair  Language: Good   Psychomotor Activity  Psychomotor Activity:No data recorded  Assets  Assets: Physical Health; Resilience; Social Support   Sleep  Sleep:No data recorded  Physical Exam: Physical Exam Vitals and nursing note reviewed.  Constitutional:      Appearance: Normal appearance.  HENT:     Head: Normocephalic and atraumatic.     Mouth/Throat:     Pharynx: Oropharynx is clear.  Eyes:     Pupils: Pupils are equal, round, and reactive to light.  Cardiovascular:     Rate and Rhythm: Normal rate and regular rhythm.  Pulmonary:      Effort: Pulmonary effort is normal.     Breath sounds: Normal breath sounds.  Abdominal:     General: Abdomen is flat.     Palpations: Abdomen is soft.  Musculoskeletal:        General: Normal range of motion.  Skin:    General: Skin is warm and dry.  Neurological:     General: No focal deficit present.     Mental Status: He is alert. Mental status is at baseline.  Psychiatric:        Attention and Perception: Attention normal. He perceives auditory hallucinations.        Mood and Affect: Mood normal. Affect is blunt.        Speech: Speech is delayed.        Behavior: Behavior is slowed.        Thought Content: Thought content normal.    Review of Systems  Constitutional: Negative.   HENT: Negative.    Eyes: Negative.   Respiratory: Negative.    Cardiovascular: Negative.   Gastrointestinal: Negative.   Musculoskeletal: Negative.   Skin: Negative.   Neurological: Negative.   Psychiatric/Behavioral:  Positive for hallucinations. Negative for depression and suicidal ideas. The patient is not nervous/anxious.    Blood pressure (!) 141/97, pulse 64, temperature 98.6 F (37 C), temperature source Oral, resp. rate 17, height 6\' 1"  (1.854 m), weight 60.1 kg, SpO2 100 %. Body mass index is 17.48 kg/m.  Mental Status Per Nursing Assessment::   On Admission:  NA  Demographic Factors:  Male and Living alone  Loss Factors: Financial problems/change in socioeconomic status  Historical Factors: NA  Risk Reduction Factors:   Positive social support  Continued Clinical Symptoms:  Schizophrenia:   Paranoid or undifferentiated  type  Cognitive Features That Contribute To Risk:  None    Suicide Risk:  Minimal: No identifiable suicidal ideation.  Patients presenting with no risk factors but with morbid ruminations; may be classified as minimal risk based on the severity of the depressive symptoms   Follow-up Information     [place] to.   Why: [facility]  present for new patient walk in clinic Mon - Fri from 0800AM to 1500PM Contact information: [address] [place] [address] [phone]                 Plan Of Care/Follow-up recommendations:  Other:  Patient has not shown any dangerous behavior in the hospital.  Denies suicidal ideation.  Behavior calm.  Agrees to outpatient treatment.  Follow-up with [facility] and [facility].  Go back to the apartment where he had been living.  Alethia Berthold, MD 03/27/2022, 9:44 AM

## 2022-03-27 NOTE — Progress Notes (Addendum)
  [facility] Adult Case Management Discharge Plan :  Will you be returning to the same living situation after discharge:  Yes,  Patient to return to place of residence.  At discharge, do you have transportation home?: Yes,  Patient's father to provide transportation from hospital  Do you have the ability to pay for your medications: Yes,  [place] Medicaid    Release of information consent forms completed and in the chart;  Patient's signature needed at discharge.  Patient to Follow up at:  Follow-up Information     [place] to.   Why: [facility] present for new patient walk in clinic Mon - Fri from 0800AM to 1500PM Contact information: [address] [place] [address] [phone]                 Next level of care provider has access to [place] and Suicide Prevention discussed: Yes,  SPE completed with patient, patient declined consent for CSW to reach family/friend.    Has patient been referred to the Quitline?: N/A patient is not a smoker Tobacco Use: Medium Risk (03/25/2022)   Patient History    Smoking Tobacco Use: Never    Smokeless Tobacco Use: Former    Passive Exposure: Never   Patient has been referred for addiction treatment: Pt. refused referral . Social History   Substance and Sexual Activity  Drug Use Not Currently   Types: Marijuana   Social History   Substance and Sexual Activity  Alcohol Use Not Currently   Alcohol/week: 1.0 standard drink of alcohol   Types: 1 Cans of beer per week   Comment: occasional   Durenda Hurt, LCSWA 03/27/2022, 9:08 AM

## 2022-03-27 NOTE — BHH Suicide Risk Assessment (Signed)
[facility] INPATIENT:  Family/Significant Other Suicide Prevention Education  Suicide Prevention Education:  Patient Refusal for Family/Significant Other Suicide Prevention Education: The patient Wesley Peterson has refused to provide written consent for family/significant other to be provided Family/Significant Other Suicide Prevention Education during admission and/or prior to discharge.  Physician notified.  Durenda Hurt 03/27/2022, 9:16 AM

## 2022-03-27 NOTE — BHH Counselor (Signed)
Adult Comprehensive Assessment  Patient ID: Wesley Peterson, male   DOB: Jun 05, 1985, 37 y.o.   MRN: [id number]   Summary/Recommendations:   Summary and Recommendations (to be completed by the evaluator): 37 y/o male w/ dx of Schizoaffective d/o, bipolar type [place] w/ [place] Medicaid admitted due to auditory hallucinations. Patient reports he simply came to the emergency department in order to get his routine antipsychotic LAI. Endorsed auditory hallucinations at ED admission, though states the voices are under control at this point. Patient able to contract for safety and states he is ready for discharge. Patient is well known to the behavioral health service line. His current presentation is similar to his baseline functioning. Patient states he lives with friends who ensure his safety. states he has no further goals for treatment.  Patient is not associated with any outpatient mental health services; historically patient presents to the ED when he is in need of LAI. Therapeutic recommendations include further crisis stabilization, medication management, group therapy, and case management.   Durenda Hurt. 03/27/2022

## 2022-03-27 NOTE — BHH Group Notes (Signed)
[facility] Group Notes:  (Nursing/MHT/Case Management/Adjunct)  Date:  03/27/2022  Time:  9:47 AM  Type of Therapy:   Community Meeting  Participation Level:  Did Not Attend   Adela Lank [facility] 03/27/2022, 9:47 AM

## 2022-03-27 NOTE — Progress Notes (Signed)
Patient pleasant and cooperative on approach. Denies SI,HI and AVH. Verbalized understanding discharge instructions,prescriptions and follow up care. 7 days medicines given to patient. All belongings returned from BMU locker. Suicide safety plan filled by patient and placed in chart. Copy given to patient.Patient escorted out by staff and transported by family. 

## 2022-03-27 NOTE — Discharge Summary (Signed)
Physician Discharge Summary Note  Patient:  Wesley Peterson is an 37 y.o., male MRN:  [id number] DOB:  01-04-1986 Patient phone:  [phone] (home)  Patient address:   [place] [address],  Total Time spent with patient: 30 minutes  Date of Admission:  03/25/2022 Date of Discharge: 03/27/2022  Reason for Admission: Patient was admitted in transfer from [facility] because of psychotic symptoms and being off of his medicine.  Principal Problem: Schizoaffective disorder, bipolar type [facility]) Discharge Diagnoses: Principal Problem:   Schizoaffective disorder, bipolar type ([name]) Active Problems:   Essential hypertension, benign   Past Psychiatric History: Past history of schizophrenia and multiple prior presentations and brief admissions.  Past Medical History:  Past Medical History:  Diagnosis Date   Schizophrenia ([place])    Schizophrenia, paranoid type ([place]) 02/02/2021   History reviewed. No pertinent surgical history. Family History: History reviewed. No pertinent family history. Family Psychiatric  History: See previous Social History:  Social History   Substance and Sexual Activity  Alcohol Use Not Currently   Alcohol/week: 1.0 standard drink of alcohol   Types: 1 Cans of beer per week   Comment: occasional     Social History   Substance and Sexual Activity  Drug Use Not Currently   Types: Marijuana    Social History   Socioeconomic History   Marital status: Single    Spouse name: Not on file   Number of children: 1   Years of education: Not on file   Highest education level: Not on file  Occupational History   Occupation: Unemployed  Tobacco Use   Smoking status: Never    Passive exposure: Never   Smokeless tobacco: Former   Tobacco comments:    Vaping nicotine   Vaping Use   Vaping Use: Some days   Start date: 02/01/2020   Substances: Nicotine  Substance and Sexual Activity   Alcohol use: Not Currently    Alcohol/week: 1.0 standard  drink of alcohol    Types: 1 Cans of beer per week    Comment: occasional   Drug use: Not Currently    Types: Marijuana   Sexual activity: Yes    Birth control/protection: Condom  Other Topics Concern   Not on file  Social History Narrative   ** Merged History Encounter **       Pt lives with mother and other relatives.  Currently unemployed.  Wesley Peterson outpatient psychiatry services through [facility].   Social Determinants of Health   Financial Resource Strain: Not on file  Food Insecurity: No Food Insecurity (03/25/2022)   Hunger Vital Sign    Worried About Running Out of Food in the Last Year: Never true    Ran Out of Food in the Last Year: Never true  Transportation Needs: No Transportation Needs (03/25/2022)   PRAPARE - Hydrologist (Medical): No    Lack of Transportation (Non-Medical): No  Physical Activity: Not on file  Stress: Not on file  Social Connections: Not on file    Hospital Course: Patient seen and chart reviewed.  37 year old man with schizophrenia received his long-acting injection over the weekend.  He has had no behavior problems since being here.  He has consistently denied suicidal ideation.  He admits that he continues to have some auditory hallucinations but says they are much quieter and are not bothering him.  There is no sign of acute dangerousness and he states that he is agreeable to continued outpatient follow-up.  Patient has been educated about the importance of staying compliant with his shot and medication.  He will be given prescriptions and follow-up arrangements.  He has been educated to stay on his oral medicine for another couple weeks before stopping it and then hopefully he can switch over to the long-acting injectable home only as a treatment for schizophrenia.  Physical Findings: AIMS: Facial and Oral Movements Muscles of Facial Expression: None, normal Lips and Perioral Area: None, normal Jaw: None,  normal Tongue: None, normal,Extremity Movements Upper (arms, wrists, hands, fingers): None, normal Lower (legs, knees, ankles, toes): None, normal, Trunk Movements Neck, shoulders, hips: None, normal, Overall Severity Severity of abnormal movements (highest score from questions above): None, normal Incapacitation due to abnormal movements: None, normal Patient's awareness of abnormal movements (rate only patient's report): No Awareness, Dental Status Current problems with teeth and/or dentures?: No Does patient usually wear dentures?: No  CIWA:    COWS:     Musculoskeletal: Strength & Muscle Tone: within normal limits Gait & Station: normal Patient leans: N/A   Psychiatric Specialty Exam:  Presentation  General Appearance:  Appropriate for Environment  Eye Contact: Fair  Speech: Clear and Coherent; Normal Rate  Speech Volume: Normal  Handedness: Right   Mood and Affect  Mood: Anxious  Affect: Congruent   Thought Process  Thought Processes: Coherent  Descriptions of Associations:Intact  Orientation:Full (Time, Place and Person)  Thought Content:Paranoid Ideation  History of Schizophrenia/Schizoaffective disorder:Yes  Duration of Psychotic Symptoms:Greater than six months  Hallucinations:No data recorded Ideas of Reference:Paranoia  Suicidal Thoughts:No data recorded Homicidal Thoughts:No data recorded  Sensorium  Memory: Immediate Fair; Recent Fair; Remote Fair  Judgment: Fair  Insight: Fair   Community education officer  Concentration: Fair  Attention Span: Fair  Recall: [place] of Knowledge: Fair  Language: Good   Psychomotor Activity  Psychomotor Activity:No data recorded  Assets  Assets: Physical Health; Resilience; Social Support   Sleep  Sleep:No data recorded   Physical Exam: Physical Exam Vitals and nursing note reviewed.  Constitutional:      Appearance: Normal appearance.  HENT:     Head: Normocephalic  and atraumatic.     Mouth/Throat:     Pharynx: Oropharynx is clear.  Eyes:     Pupils: Pupils are equal, round, and reactive to light.  Cardiovascular:     Rate and Rhythm: Normal rate and regular rhythm.  Pulmonary:     Effort: Pulmonary effort is normal.     Breath sounds: Normal breath sounds.  Abdominal:     General: Abdomen is flat.     Palpations: Abdomen is soft.  Musculoskeletal:        General: Normal range of motion.  Skin:    General: Skin is warm and dry.  Neurological:     General: No focal deficit present.     Mental Status: He is alert. Mental status is at baseline.  Psychiatric:        Attention and Perception: Attention normal. He perceives auditory hallucinations.        Mood and Affect: Mood normal. Affect is blunt.        Speech: Speech is delayed.        Behavior: Behavior is slowed.        Thought Content: Thought content normal. Thought content does not include homicidal or suicidal ideation.        Cognition and Memory: Cognition normal.    Review of Systems  Constitutional: Negative.   HENT: Negative.  Eyes: Negative.   Respiratory: Negative.    Cardiovascular: Negative.   Gastrointestinal: Negative.   Musculoskeletal: Negative.   Skin: Negative.   Neurological: Negative.   Psychiatric/Behavioral:  Positive for hallucinations. Negative for depression, substance abuse and suicidal ideas. The patient is not nervous/anxious and does not have insomnia.    Blood pressure (!) 141/97, pulse 64, temperature 98.6 F (37 C), temperature source Oral, resp. rate 17, height 6\' 1"  (1.854 m), weight 60.1 kg, SpO2 100 %. Body mass index is 17.48 kg/m.   Social History   Tobacco Use  Smoking Status Never   Passive exposure: Never  Smokeless Tobacco Former  Tobacco Comments   Vaping nicotine    Tobacco Cessation:  N/A, patient does not currently use tobacco products   Blood Alcohol level:  Lab Results  Component Value Date   ETH <10 03/23/2022    ETH <10 [id number]    Metabolic Disorder Labs:  Lab Results  Component Value Date   HGBA1C 5.8 (H) 03/23/2022   MPG 120 03/23/2022   MPG 116.89 10/27/2021   Lab Results  Component Value Date   PROLACTIN 3.0 (L) 03/23/2022   PROLACTIN 19.9 (H) 09/06/2021   Lab Results  Component Value Date   CHOL 150 03/23/2022   TRIG 115 03/23/2022   HDL 46 03/23/2022   CHOLHDL 3.3 03/23/2022   VLDL 23 03/23/2022   LDLCALC 81 03/23/2022   LDLCALC 80 06/02/2021    See Psychiatric Specialty Exam and Suicide Risk Assessment completed by Attending Physician prior to discharge.  Discharge destination:  Home  Is patient on multiple antipsychotic therapies at discharge:  No   Has Patient had three or more failed trials of antipsychotic monotherapy by history:  No  Recommended Plan for Multiple Antipsychotic Therapies: NA  Discharge Instructions     Diet - low sodium heart healthy   Complete by: As directed    Increase activity slowly   Complete by: As directed       Allergies as of 03/27/2022   No Known Allergies      Medication List     TAKE these medications      Indication  amLODipine 5 MG tablet Commonly known as: NORVASC Take 1 tablet (5 mg total) by mouth daily.  Indication: High Blood Pressure Disorder   ARIPiprazole 15 MG tablet Commonly known as: ABILIFY Take 1 tablet (15 mg total) by mouth daily. What changed: Another medication with the same name was added. Make sure you understand how and when to take each.  Indication: Schizophrenia   ARIPiprazole ER 400 MG Prsy prefilled syringe Commonly known as: ABILIFY MAINTENA Inject 400 mg into the muscle every 28 (twenty-eight) days. Start taking on: April 22, 2022 What changed: You were already taking a medication with the same name, and this prescription was added. Make sure you understand how and when to take each.  Indication: Schizophrenia   docusate sodium 100 MG capsule Commonly known as: COLACE Take 1  capsule (100 mg total) by mouth daily.  Indication: Constipation   hydrOXYzine 25 MG tablet Commonly known as: ATARAX Take 1 tablet (25 mg total) by mouth 3 (three) times daily as needed for anxiety.  Indication: Feeling Anxious   pantoprazole 40 MG tablet Commonly known as: PROTONIX Take 1 tablet (40 mg total) by mouth daily.  Indication: Gastroesophageal Reflux Disease   traZODone 100 MG tablet Commonly known as: DESYREL Take 1 tablet (100 mg total) by mouth at bedtime.  Indication: Trouble Sleeping  valbenazine 40 MG capsule Commonly known as: Ingrezza Take 1 capsule (40 mg total) by mouth daily.  Indication: Tardive Dyskinesia        Follow-up Information     [facility].   Why: [facility] present for new patient walk in clinic Mon - Fri from 0800AM to 1500PM Contact information: [address] [place] [address] [phone]                 Follow-up recommendations:  Other:  Follow-up with outpatient provider [facility] or with the provider you been seeing through [facility].  Comments: See above  Signed: Alethia Berthold, MD 03/27/2022, 9:48 AM

## 2022-03-27 NOTE — Group Note (Signed)
Recreation Therapy Group Note   Group Topic:Goal Setting  Group Date: 03/27/2022 Start Time: 1000 End Time: 1045 Facilitators: Vilma Prader, LRT, CTRS Location:  Craft Room  Group Description: Scientist, physiological. Patients were given many different magazines, a glue stick, markers, and a piece of cardstock paper. LRT and pts discussed the importance of having goals in life. LRT and pts discussed the difference between short-term and long-term goals. LRT encouraged pts to create a vision board, with images they picked and then cut out by LRT from the magazine, for themselves, that capture their short and long-term goals. On the back of the paper, pt encouraged to write 3 different coping skills that can help them reach those goals. LRT encouraged pts to show and explain their vision board to the group once complete. LRT offered to laminate vision board once dry and complete.   Affect/Mood: N/A   Participation Level: Did not attend    Clinical Observations/Individualized Feedback: Wesley Peterson did not attend group due to resting in his room.   Plan: Continue to engage patient in RT group sessions 2-3x/week.   Vilma Prader, LRT, CTRS 03/27/2022 10:57 AM

## 2022-03-27 NOTE — BH IP Treatment Plan (Addendum)
Interdisciplinary Treatment and Diagnostic Plan Update  03/27/2022 Time of Session: 0830 Wesley Peterson MRN: [id number]  Principal Diagnosis: Schizoaffective disorder, bipolar type ([place])  Secondary Diagnoses: Principal Problem:   Schizoaffective disorder, bipolar type ([place]) Active Problems:   Essential hypertension, benign   Current Medications:  Current Facility-Administered Medications  Medication Dose Route Frequency Provider Last Rate Last Admin   acetaminophen (TYLENOL) tablet 650 mg  650 mg Oral Q6H PRN Revonda Humphrey, NP       alum & mag hydroxide-simeth (MAALOX/MYLANTA) 200-200-20 MG/5ML suspension 30 mL  30 mL Oral Q4H PRN Revonda Humphrey, NP       amLODipine (NORVASC) tablet 5 mg  5 mg Oral Daily Revonda Humphrey, NP   5 mg at 03/27/22 0824   ARIPiprazole (ABILIFY) tablet 15 mg  15 mg Oral Daily Revonda Humphrey, NP   15 mg at 03/27/22 0823   ARIPiprazole ER (ABILIFY MAINTENA) 400 MG prefilled syringe 400 mg  400 mg Intramuscular Q28 days Clapacs, John T, MD   400 mg at 03/25/22 1723   diphenhydrAMINE (BENADRYL) capsule 50 mg  50 mg Oral TID PRN Revonda Humphrey, NP       Or   diphenhydrAMINE (BENADRYL) injection 50 mg  50 mg Intramuscular TID PRN Revonda Humphrey, NP       docusate sodium (COLACE) capsule 100 mg  100 mg Oral Daily Revonda Humphrey, NP   100 mg at 03/27/22 [id number]   haloperidol (HALDOL) tablet 5 mg  5 mg Oral TID PRN Revonda Humphrey, NP       Or   haloperidol lactate (HALDOL) injection 5 mg  5 mg Intramuscular TID PRN Revonda Humphrey, NP       hydrOXYzine (ATARAX) tablet 25 mg  25 mg Oral TID PRN Revonda Humphrey, NP       LORazepam (ATIVAN) tablet 2 mg  2 mg Oral TID PRN Revonda Humphrey, NP       Or   LORazepam (ATIVAN) injection 2 mg  2 mg Intramuscular TID PRN Revonda Humphrey, NP       magnesium hydroxide (MILK OF MAGNESIA) suspension 30 mL  30 mL Oral Daily PRN Revonda Humphrey, NP       pantoprazole (PROTONIX) EC tablet 40 mg   40 mg Oral Daily Revonda Humphrey, NP   40 mg at 03/27/22 0824   traZODone (DESYREL) tablet 100 mg  100 mg Oral QHS Revonda Humphrey, NP       valbenazine [facility]) capsule 40 mg  40 mg Oral Daily Revonda Humphrey, NP   40 mg at 03/27/22 [id number]   PTA Medications: Medications Prior to Admission  Medication Sig Dispense Refill Last Dose   ARIPiprazole (ABILIFY) 15 MG tablet Take 1 tablet (15 mg total) by mouth daily.      [DISCONTINUED] amLODipine (NORVASC) 5 MG tablet Take 1 tablet (5 mg total) by mouth daily. 30 tablet 0    [DISCONTINUED] docusate sodium (COLACE) 100 MG capsule Take 1 capsule (100 mg total) by mouth daily. 30 capsule 0    [DISCONTINUED] hydrOXYzine (ATARAX) 25 MG tablet Take 1 tablet (25 mg total) by mouth 3 (three) times daily as needed for anxiety. 30 tablet 0    [DISCONTINUED] pantoprazole (PROTONIX) 40 MG tablet Take 1 tablet (40 mg total) by mouth daily. 30 tablet 0    [DISCONTINUED] traZODone (DESYREL) 100 MG tablet Take 1 tablet (100 mg total) by mouth at bedtime.  30 tablet 0    [DISCONTINUED] valbenazine (INGREZZA) 40 MG capsule Take 1 capsule (40 mg total) by mouth daily. 30 capsule 0     Patient Stressors: Other: AH    Patient Strengths: Capable of independent living  Scientist, research (life sciences)  Supportive family/friends   Treatment Modalities: Medication Management, Group therapy, Case management,  1 to 1 session with clinician, Psychoeducation, Recreational therapy.   Physician Treatment Plan for Primary Diagnosis: Schizoaffective disorder, bipolar type ([place]) Long Term Goal(s): Improvement in symptoms so as ready for discharge   Short Term Goals: Compliance with prescribed medications will improve Ability to verbalize feelings will improve Ability to demonstrate self-control will improve  Medication Management: Evaluate patient's response, side effects, and tolerance of medication regimen.  Therapeutic Interventions: 1 to 1 sessions, Unit Group sessions and  Medication administration.  Evaluation of Outcomes: Adequate for Discharge  Physician Treatment Plan for Secondary Diagnosis: Principal Problem:   Schizoaffective disorder, bipolar type ([place]) Active Problems:   Essential hypertension, benign  Long Term Goal(s): Improvement in symptoms so as ready for discharge   Short Term Goals: Compliance with prescribed medications will improve Ability to verbalize feelings will improve Ability to demonstrate self-control will improve     Medication Management: Evaluate patient's response, side effects, and tolerance of medication regimen.  Therapeutic Interventions: 1 to 1 sessions, Unit Group sessions and Medication administration.  Evaluation of Outcomes: Adequate for Discharge   RN Treatment Plan for Primary Diagnosis: Schizoaffective disorder, bipolar type ([place]) Long Term Goal(s): Knowledge of disease and therapeutic regimen to maintain health will improve  Short Term Goals: Ability to remain free from injury will improve, Ability to verbalize frustration and anger appropriately will improve, Ability to demonstrate self-control, Ability to participate in decision making will improve, Ability to verbalize feelings will improve, Ability to disclose and discuss suicidal ideas, Ability to identify and develop effective coping behaviors will improve, and Compliance with prescribed medications will improve  Medication Management: RN will administer medications as ordered by provider, will assess and evaluate patient's response and provide education to patient for prescribed medication. RN will report any adverse and/or side effects to prescribing provider.  Therapeutic Interventions: 1 on 1 counseling sessions, Psychoeducation, Medication administration, Evaluate responses to treatment, Monitor vital signs and CBGs as ordered, Perform/monitor CIWA, COWS, AIMS and Fall Risk screenings as ordered, Perform wound care treatments as ordered.  Evaluation  of Outcomes: Adequate for Discharge   LCSW Treatment Plan for Primary Diagnosis: Schizoaffective disorder, bipolar type ([place]) Long Term Goal(s): Safe transition to appropriate next level of care at discharge, Engage patient in therapeutic group addressing interpersonal concerns.  Short Term Goals: Engage patient in aftercare planning with referrals and resources, Increase social support, Increase ability to appropriately verbalize feelings, Increase emotional regulation, Facilitate acceptance of mental health diagnosis and concerns, Facilitate patient progression through stages of change regarding substance use diagnoses and concerns, Identify triggers associated with mental health/substance abuse issues, and Increase skills for wellness and recovery  Therapeutic Interventions: Assess for all discharge needs, 1 to 1 time with Social worker, Explore available resources and support systems, Assess for adequacy in community support network, Educate family and significant other(s) on suicide prevention, Complete Psychosocial Assessment, Interpersonal group therapy.  Evaluation of Outcomes: Adequate for Discharge   Progress in Treatment: Attending groups: Yes. Participating in groups: Yes. Taking medication as prescribed: Yes. Toleration medication: Yes. Family/Significant other contact made: No, will contact:  Patient declined Patient understands diagnosis: Yes. Discussing patient identified problems/goals with staff: Yes. Medical  problems stabilized or resolved: Yes. Denies suicidal/homicidal ideation: Yes. Issues/concerns per patient self-inventory: Yes. Other: none  New problem(s) identified: No, Describe:  none  New Short Term/Long Term Goal(s): Patient to continue working towards treatment goals after discharge. Patient no longer meets criteria for inpatient criteria per attending physician. Continue taking medications as prescribed, nursing to provide instructions at discharge. Follow  up with all scheduled appointments.   Patient Goals:  Patient states their goal for treatment is to "I just came to get a shot and that was about it"  Discharge Plan or Barriers: No psychosocial barriers identified at this time, patient to return to place of residence when appropriate for discharge.   Reason for Continuation of Hospitalization: NA, patient to discharge on this day   Estimated Length of Stay: NA  Last [place] Suicide Severity Risk Score: [place] Admission (Current) from 03/25/2022 in [place] ED from 03/23/2022 in [facility] Admission (Discharged) from 10/25/2021 in [place] [facility]  C-SSRS RISK CATEGORY No Risk No Risk No Risk       Last PHQ 2/9 Scores:    03/15/2022    3:34 PM  Depression screen PHQ 2/9  Decreased Interest 2  Down, Depressed, Hopeless 0  PHQ - 2 Score 2  Altered sleeping 3  Tired, decreased energy 3  Change in appetite 3  Feeling bad or failure about yourself  1  Trouble concentrating 3  Moving slowly or fidgety/restless 2  Suicidal thoughts 0  PHQ-9 Score 17  Difficult doing work/chores Somewhat difficult    Scribe for Treatment Team: Larose Kells 03/27/2022 10:09 AM

## 2022-03-27 NOTE — Progress Notes (Signed)
NUTRITION ASSESSMENT  Pt identified as at risk on the Malnutrition Screen Tool  INTERVENTION:  -Ensure Enlive po TID, each supplement provides 350 kcal and 20 grams of protein.  -MVI with minerals daily   NUTRITION DIAGNOSIS: Unintentional weight loss related to sub-optimal intake as evidenced by pt report.   Goal: Pt to meet >/= 90% of their estimated nutrition needs.  Monitor:  PO intake  Assessment:  Pt admitted with schizoaffective disorder.   Pt currently on a regular diet. No meal completion data available to assess at this time. Pt complains of decreased appetite.   Tox screen is positive for cannabis.   Pt mostly isolates to his room. He has not attended group session and refused PM medicatio.   Reviewed wt hx; pt has experienced a 10.4% wt loss over the past 3 months, which is significant for time frame.   Pt is at high risk for malnutrition due to underweight status and significant wt loss. Pt would greatly benefit from addition of oral nutrition supplements.   Medications reviewed and include colace.   Labs reviewed: CBGS: 59.    37 y.o. male  Height: Ht Readings from Last 1 Encounters:  03/25/22 6\' 1"  (1.854 m)    Weight: Wt Readings from Last 1 Encounters:  03/25/22 60.1 kg    Weight Hx: Wt Readings from Last 10 Encounters:  03/25/22 60.1 kg  03/23/22 60.3 kg  03/15/22 60.8 kg  03/15/22 61.1 kg  12/20/21 67.1 kg  11/23/21 68.5 kg  10/25/21 66.2 kg  09/03/21 66.2 kg  07/21/21 70.2 kg  06/01/21 70.8 kg    BMI:  Body mass index is 17.48 kg/m. Pt meets criteria for underweight based on current BMI.  Estimated Nutritional Needs: Kcal: 25-30 kcal/kg Protein: > 1 gram protein/kg Fluid: 1 ml/kcal  Diet Order:  Diet Order             Diet regular Room service appropriate? Yes; Fluid consistency: Thin  Diet effective now                  Pt is also offered choice of unit snacks mid-morning and mid-afternoon.  Pt is eating as  desired.   Lab results and medications reviewed.   Loistine Chance, RD, LDN, [place] Registered Dietitian II Certified Diabetes Care and Education Specialist Please refer to [facility] for RD and/or RD on-call/weekend/after hours pager

## 2022-03-28 ENCOUNTER — Telehealth (HOSPITAL_COMMUNITY): Payer: Self-pay | Admitting: *Deleted

## 2022-03-28 NOTE — Telephone Encounter (Signed)
Please review chart for scheduling

## 2022-04-10 NOTE — Telephone Encounter (Signed)
Informed patient and his father with what provider stated and they verbalized understanding. Per pt and his father, they have been to [facility] before and they did not do anything for him and he ended up getting in trouble. Staff informed patient and his father for them to call patient PCP and see if they have any recommendations as to where they could go for Behavioral Health so they can have a facility to go to for him to get his injections soon. Patient and his father verbalized understanding and agreed.

## 2022-04-18 ENCOUNTER — Telehealth (HOSPITAL_COMMUNITY): Payer: Self-pay

## 2022-04-18 NOTE — Telephone Encounter (Signed)
Mother called [facility] stating that the office has dropped her sons care. 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 Patient's mother was advised that the patient and his father were notified of this on April 1st (the diagnosis was not disclosed as there is no DPR on file). I asked the mother to speak with the patient and she refused. I then stated I could only discuss the patient's care with the patient. She states that she will be taking him to the [place] office for his shot. I advised that he will not be able to receive his shot there without a provider accepting his care. Mother states that "You cannot tell me where to go get care." I again advised that the patient was referred to [facility] and that is where he should go for treatment. Patient stated he will just go to [facility] and mother stated "No we are not going back there."  After reviewing the chart further, the patient was recently discharged from [facility] IP treatment. I then sent Patient a Mychart and a letter Via certified mail, reiterating the discharge instructions which was to take the paper prescription given to him at discharge and to pick it up at the pharmacy. He was to then go to [facility] in [place] on Mon-Fri from 7a - 3p (Walk-in Hours) to get the shot administered. No answer when attempting to call the patient at the number on the chart.   Front office staff at [employer] and [facility] were advised of the appropriate route for the patient in the event he reaches back out.

## 2022-04-18 NOTE — Telephone Encounter (Signed)
Letter sent out certified today per Rosalynne

## 2022-04-25 ENCOUNTER — Telehealth (HOSPITAL_COMMUNITY): Payer: Self-pay | Admitting: Physician Assistant

## 2022-04-26 ENCOUNTER — Ambulatory Visit (HOSPITAL_COMMUNITY): Payer: No Typology Code available for payment source

## 2022-04-26 ENCOUNTER — Other Ambulatory Visit (HOSPITAL_COMMUNITY): Payer: Self-pay | Admitting: Physician Assistant

## 2022-04-26 ENCOUNTER — Telehealth (HOSPITAL_COMMUNITY): Payer: Self-pay | Admitting: Physician Assistant

## 2022-04-26 DIAGNOSIS — F250 Schizoaffective disorder, bipolar type: Secondary | ICD-10-CM

## 2022-04-26 MED ORDER — ARIPIPRAZOLE ER 400 MG IM PRSY: 400.0000 mg | PREFILLED_SYRINGE | INTRAMUSCULAR | 1 refills | Status: DC

## 2022-04-26 NOTE — Progress Notes (Signed)
Provider was contacted by [facility] regarding request for patient's medication to be refilled. Patient's medication to be e-prescribed to pharmacy of choice.

## 2022-04-26 NOTE — Telephone Encounter (Signed)
Certified letter came back to office due to address not being correct. Spoke with patient to verify address and he stated he lives at a homeless shelter. Staff asked patient if they are able to receive mail at the homeless shelter and patient stated "yes they are". Staff asked patient for the homeless shelter address. The first time patient stated he thinks its [address] Staff then asked patient to please verify that information due to him having to sign for the mail office has to mail to him. Patient then stated [address] in [place]. Staff asked patient if that's the correct one and he stated "yes". Patient then asked what letter is being sent. Staff informed him that it's about the conversation him and the Engineer, manufacturing talked about. Patient then stated "oh ok". Letter was readdressed for the new address and will be sent to the correct address today with afternoon outgoing mail.     [address] [place], [place] [address]

## 2022-04-26 NOTE — Telephone Encounter (Signed)
Pt clld wanting to make an appt. To get his inj. I did explain to the patient that he needs to bring proof of his [place] residency to his visit, if not then this would be his last visit with [place] & he would need to go to one of the places that he was referred to by [place] ([facility] or [facility]) Patient agreed said he understands.

## 2022-04-26 NOTE — Telephone Encounter (Signed)
Message acknowledged and reviewed.

## 2022-05-02 ENCOUNTER — Other Ambulatory Visit (HOSPITAL_COMMUNITY): Payer: Self-pay | Admitting: Psychiatry

## 2022-05-02 ENCOUNTER — Ambulatory Visit (INDEPENDENT_AMBULATORY_CARE_PROVIDER_SITE_OTHER): Payer: No Typology Code available for payment source | Admitting: *Deleted

## 2022-05-02 ENCOUNTER — Encounter (HOSPITAL_COMMUNITY): Payer: Self-pay

## 2022-05-02 VITALS — BP 129/83 | HR 79 | Resp 18 | Ht 73.0 in | Wt 140.4 lb

## 2022-05-02 DIAGNOSIS — F250 Schizoaffective disorder, bipolar type: Secondary | ICD-10-CM | POA: Diagnosis not present

## 2022-05-02 DIAGNOSIS — G4700 Insomnia, unspecified: Secondary | ICD-10-CM

## 2022-05-02 DIAGNOSIS — G2401 Drug induced subacute dyskinesia: Secondary | ICD-10-CM

## 2022-05-02 MED ORDER — ARIPIPRAZOLE ER 400 MG IM PRSY: 400.0000 mg | PREFILLED_SYRINGE | INTRAMUSCULAR | 11 refills | Status: AC

## 2022-05-02 MED ORDER — VALBENAZINE TOSYLATE 40 MG PO CAPS
40.0000 mg | ORAL_CAPSULE | Freq: Every day | ORAL | 3 refills | Status: AC
Start: 2022-05-02 — End: ?

## 2022-05-02 MED ORDER — TRAZODONE HCL 100 MG PO TABS
100.0000 mg | ORAL_TABLET | Freq: Every day | ORAL | 3 refills | Status: AC
Start: 2022-05-02 — End: ?

## 2022-05-02 MED ORDER — HYDROXYZINE HCL 25 MG PO TABS: 25.0000 mg | ORAL_TABLET | Freq: Three times a day (TID) | ORAL | 3 refills | Status: AC | PRN

## 2022-05-02 MED ORDER — ARIPIPRAZOLE ER 400 MG IM PRSY
400.0000 mg | PREFILLED_SYRINGE | Freq: Once | INTRAMUSCULAR | Status: AC
Start: 2022-05-02 — End: 2022-05-02
  Administered 2022-05-02: 400 mg via INTRAMUSCULAR

## 2022-05-02 NOTE — Progress Notes (Signed)
Patient arrived with his Abilify Maintena  injection to be given. Pleasant, cooperative, denies SI/HI or AV hallucinations. Given in his Right Deltoid without issues or complaints.

## 2022-05-02 NOTE — Telephone Encounter (Signed)
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 He notes that he feels mentally stable and denies symptoms of psychosis. Today he denies SI/HI/VAH, mania, or paranoia. Nursing staff administered Abilify 400 mg in patients right arm. Provider filled patients current prescription to preferred pharmacy. No other concerns noted at this time.

## 2022-12-19 ENCOUNTER — Other Ambulatory Visit: Payer: Self-pay

## 2023-06-20 DIAGNOSIS — Z0000 Encounter for general adult medical examination without abnormal findings: Secondary | ICD-10-CM | POA: Diagnosis not present

## 2023-06-20 DIAGNOSIS — I10 Essential (primary) hypertension: Secondary | ICD-10-CM | POA: Diagnosis not present

## 2023-06-20 DIAGNOSIS — Z681 Body mass index (BMI) 19 or less, adult: Secondary | ICD-10-CM | POA: Diagnosis not present

## 2023-09-20 DIAGNOSIS — Z6820 Body mass index (BMI) 20.0-20.9, adult: Secondary | ICD-10-CM | POA: Diagnosis not present

## 2023-09-20 DIAGNOSIS — I10 Essential (primary) hypertension: Secondary | ICD-10-CM | POA: Diagnosis not present
